# Patient Record
Sex: Female | Born: 1980 | Race: White | Hispanic: No | Marital: Married | Smoking: Former smoker
Health system: Southern US, Community
[De-identification: ages and names within clinical notes are randomized; demographics above are authoritative.]

## PROBLEM LIST (undated history)

## (undated) DIAGNOSIS — F10239 Alcohol dependence with withdrawal, unspecified: Secondary | ICD-10-CM

## (undated) DIAGNOSIS — E079 Disorder of thyroid, unspecified: Secondary | ICD-10-CM

## (undated) DIAGNOSIS — K859 Acute pancreatitis without necrosis or infection, unspecified: Secondary | ICD-10-CM

## (undated) DIAGNOSIS — F1010 Alcohol abuse, uncomplicated: Secondary | ICD-10-CM

## (undated) DIAGNOSIS — E876 Hypokalemia: Secondary | ICD-10-CM

## (undated) DIAGNOSIS — F10939 Alcohol use, unspecified with withdrawal, unspecified: Secondary | ICD-10-CM

## (undated) DIAGNOSIS — R569 Unspecified convulsions: Secondary | ICD-10-CM

## (undated) DIAGNOSIS — F1020 Alcohol dependence, uncomplicated: Secondary | ICD-10-CM

## (undated) HISTORY — PX: FINGER SURGERY: SHX640

---

## 2016-09-10 ENCOUNTER — Encounter (HOSPITAL_BASED_OUTPATIENT_CLINIC_OR_DEPARTMENT_OTHER): Payer: Self-pay

## 2016-09-10 ENCOUNTER — Emergency Department (HOSPITAL_BASED_OUTPATIENT_CLINIC_OR_DEPARTMENT_OTHER)
Admission: EM | Admit: 2016-09-10 | Discharge: 2016-09-10 | Disposition: A | Discharge status: Home / Self Care | Payer: Self-pay | Attending: Emergency Medicine | Admitting: Emergency Medicine

## 2016-09-10 DIAGNOSIS — Z0001 Encounter for general adult medical examination with abnormal findings: Secondary | ICD-10-CM | POA: Insufficient documentation

## 2016-09-10 DIAGNOSIS — R251 Tremor, unspecified: Secondary | ICD-10-CM | POA: Insufficient documentation

## 2016-09-10 DIAGNOSIS — Z139 Encounter for screening, unspecified: Secondary | ICD-10-CM

## 2016-09-10 DIAGNOSIS — R42 Dizziness and giddiness: Secondary | ICD-10-CM | POA: Insufficient documentation

## 2016-09-10 DIAGNOSIS — Z79899 Other long term (current) drug therapy: Secondary | ICD-10-CM | POA: Insufficient documentation

## 2016-09-10 HISTORY — DX: Disorder of thyroid, unspecified: E07.9

## 2016-09-10 HISTORY — DX: Alcohol abuse, uncomplicated: F10.10

## 2016-09-10 HISTORY — DX: Hypokalemia: E87.6

## 2016-09-10 LAB — BASIC METABOLIC PANEL
Anion gap: 6 (ref 5–15)
BUN: 6 mg/dL (ref 6–20)
CO2: 32 mmol/L (ref 22–32)
Calcium: 9.2 mg/dL (ref 8.9–10.3)
Chloride: 100 mmol/L — ABNORMAL LOW (ref 101–111)
Creatinine, Ser: 0.78 mg/dL (ref 0.44–1.00)
Glucose, Bld: 112 mg/dL — ABNORMAL HIGH (ref 65–99)
POTASSIUM: 3.6 mmol/L (ref 3.5–5.1)
SODIUM: 138 mmol/L (ref 135–145)

## 2016-09-10 LAB — CBC
HEMATOCRIT: 36.7 % (ref 36.0–46.0)
Hemoglobin: 12.3 g/dL (ref 12.0–15.0)
MCH: 33.9 pg (ref 26.0–34.0)
MCHC: 33.5 g/dL (ref 30.0–36.0)
MCV: 101.1 fL — ABNORMAL HIGH (ref 78.0–100.0)
PLATELETS: 203 10*3/uL (ref 150–400)
RBC: 3.63 MIL/uL — AB (ref 3.87–5.11)
RDW: 13.5 % (ref 11.5–15.5)
WBC: 3 10*3/uL — AB (ref 4.0–10.5)

## 2016-09-10 MED ORDER — POTASSIUM CHLORIDE ER 20 MEQ PO TBCR: 20.0000 meq | EXTENDED_RELEASE_TABLET | Freq: Every day | ORAL | 0 refills | Status: DC

## 2016-09-10 NOTE — ED Notes (Signed)
ED Provider at bedside. 

## 2016-09-10 NOTE — ED Provider Notes (Signed)
[facility] Provider Note   CSN: [id number] Arrival date & time: 09/10/16  1630  By signing my name below, I, Sheila Graves, attest that this documentation has been prepared under the direction and in the presence of Audry Piliyler Deatrice Spanbauer, PA-C.  Electronically Signed: Rosario AdieWilliam Andrew Graves, ED Scribe. 09/10/16. 5:02 PM.  History   Chief Complaint Chief Complaint  Patient presents with  . Labs Only   The history is provided by the patient. No language interpreter was used.    HPI Comments: Sheila Graves is a 36 y.o. female with a h/o hypokalemia, hypothyroidism (on Le, and alcohol abuse, who [facility] requesting her potassium levels to be checked and for a refill of her potassium chloride medication. Pt reports that she has been on potassium chloride for the past few years d/t hypokalemia, however, she recently [facility] care with [facility] for alcohol [facility] and has been unable to refill her medications. She notes that since being off of her medication that she has been experiencing intermittent paraesthesias distally and also has had hand contractures bilaterally. Pt also reports that she has been experiencing mild hand tremors over the past several weeks; however, she attributes this to her detoxing form alcohol. Pt also notes over the past several days she has experienced room-spinning dizziness and lightheadedness as well. She has been previously hospitalized for her hypokalemia. Pt denies numbness, fever, or any other associated symptoms.   Past Medical History:  Diagnosis Date  . ETOH abuse   . Hypokalemia   . Thyroid disease    There are no active problems to display for this patient.  Past Surgical History:  Procedure Laterality Date  . FINGER SURGERY     OB History    No data available     Home Medications    Prior to Admission medications   Not on File   Family History No family history on file.  Social  History Social History  Substance Use Topics  . Smoking status: Never Smoker  . Smokeless tobacco: Never Used  . Alcohol use Not on file     Comment: in rehab   Allergies   Patient has no known allergies.  Review of Systems Review of Systems  Constitutional: Negative for fever.  Neurological: Positive for dizziness, tremors and light-headedness. Negative for numbness.   Physical Exam Updated Vital Signs BP 115/78 (BP Location: Right Arm)   Pulse (!) 59   Temp 98.1 F (36.7 C) (Oral)   Resp (!) 22   Ht 5\' 10"  (1.778 m)   Wt 135 lb (61.2 kg)   LMP 08/27/2016   SpO2 100%   BMI 19.37 kg/m   Physical Exam  Constitutional: She is oriented to person, place, and time. Vital signs are normal. She appears well-developed and well-nourished. No distress.  HENT:  Head: Normocephalic and atraumatic.  Right Ear: Hearing normal.  Left Ear: Hearing normal.  Eyes: Conjunctivae and EOM are normal. Pupils are equal, round, and reactive to light.  Neck: Normal range of motion.  Cardiovascular: Normal rate, regular rhythm and normal heart sounds.   No murmur heard. Pulmonary/Chest: Effort normal and breath sounds normal. No respiratory distress. She has no wheezes. She has no rales.  Abdominal: She exhibits no distension.  Musculoskeletal: Normal range of motion.  Neurological: She is alert and oriented to person, place, and time.  Skin: Skin is warm and dry. Capillary refill takes less than 2 seconds. No pallor.  Psychiatric: She  has a normal mood and affect. Her speech is normal and behavior is normal. Thought content normal.  Nursing note and vitals reviewed.  ED Treatments / Results  DIAGNOSTIC STUDIES: Oxygen Saturation is 100% on RA, normal by my interpretation.   COORDINATION OF CARE: 4:55 PM-Discussed next steps with pt. Pt verbalized understanding and is agreeable with the plan.   Labs (all labs ordered are listed, but only abnormal results are displayed) Labs Reviewed    BASIC METABOLIC PANEL   EKG  EKG Interpretation None      Radiology No results found.  Procedures Procedures  Medications Ordered in ED Medications - No data to display  Initial Impression / Assessment and Plan / ED Course  I have reviewed the triage vital signs and the nursing notes.  Pertinent labs & imaging results that were available during my care of the patient were reviewed by me and considered in my medical decision making (see chart for details).  Final Clinical Impressions(s) / ED Diagnoses  {I have reviewed and evaluated the relevant laboratory values.   {I have reviewed the relevant previous healthcare records.  {I obtained HPI from historian.   ED Course:  Assessment: Pt is a 36 y.o. female who presents for medical clearance from [facility]. Notes unable to see PCP for potassium check. Hx hypokalemia. Out of Kdur x 1 week. . On exam, pt in NAD. Nontoxic/nonseptic appearing. VSS. Afebrile. Lungs CTA. Heart RRR. Abdomen nontender soft. Labs reassuring. Potassium 3.6. Given Rx Kdur. Plan is to DC home back to [facility]. No signs of withdrawal in ED. Medically cleared. At time of discharge, Patient is in no acute distress. Vital Signs are stable. Patient is able to ambulate. Patient able to tolerate PO.   Disposition/Plan:  DC Home Additional Verbal discharge instructions given and discussed with patient.  Pt Instructed to f/u with PCP in the next week for evaluation and treatment of symptoms. Return precautions given Pt acknowledges and agrees with plan  Supervising Physician Laurence Spates, MD  Final diagnoses:  Encounter for medical screening examination   New Prescriptions New Prescriptions   No medications on file    I personally performed the services described in this documentation, which was scribed in my presence. The recorded information has been reviewed and is accurate.    Audry Pili, PA-C 09/10/16 1740    Laurence Spates, MD 09/11/16  [phone]

## 2016-09-10 NOTE — ED Triage Notes (Signed)
[facility] to have K+ level checked-pt states she has been there since March 9 and has not been on K+ po-pt NAD-steady gait

## 2016-09-10 NOTE — Discharge Instructions (Signed)
Please read and follow all provided instructions.  Your diagnoses today include:  1. Encounter for medical screening examination    Tests performed today include: Vital signs. See below for your results today.   Medications prescribed:  Take as prescribed   Home care instructions:  Follow any educational materials contained in this packet.  Follow-up instructions: Please follow-up with your primary care provider for further evaluation of symptoms and treatment   Return instructions:  Please return to the Emergency Department if you do not get better, if you get worse, or new symptoms OR  - Fever (temperature greater than 101.64F)  - Bleeding that does not stop with holding pressure to the area    -Severe pain (please note that you may be more sore the day after your accident)  - Chest Pain  - Difficulty breathing  - Severe nausea or vomiting  - Inability to tolerate food and liquids  - Passing out  - Skin becoming red around your wounds  - Change in mental status (confusion or lethargy)  - New numbness or weakness    Please return if you have any other emergent concerns.  Additional Information:  Your vital signs today were: BP 115/78 (BP Location: Right Arm)    Pulse (!) 59    Temp 98.1 F (36.7 C) (Oral)    Resp (!) 22    Ht 5\' 10"  (1.778 m)    Wt 61.2 kg    LMP 08/27/2016    SpO2 100%    BMI 19.37 kg/m  If your blood pressure (BP) was elevated above 135/85 this visit, please have this repeated by your doctor within one month. ---------------

## 2016-09-21 ENCOUNTER — Encounter (HOSPITAL_BASED_OUTPATIENT_CLINIC_OR_DEPARTMENT_OTHER): Payer: Self-pay | Admitting: Emergency Medicine

## 2016-09-21 ENCOUNTER — Emergency Department (HOSPITAL_BASED_OUTPATIENT_CLINIC_OR_DEPARTMENT_OTHER): Payer: Self-pay

## 2016-09-21 ENCOUNTER — Emergency Department (HOSPITAL_BASED_OUTPATIENT_CLINIC_OR_DEPARTMENT_OTHER)
Admission: EM | Admit: 2016-09-21 | Discharge: 2016-09-21 | Disposition: A | Discharge status: Home / Self Care | Payer: Self-pay | Attending: Emergency Medicine | Admitting: Emergency Medicine

## 2016-09-21 DIAGNOSIS — Z79899 Other long term (current) drug therapy: Secondary | ICD-10-CM | POA: Insufficient documentation

## 2016-09-21 DIAGNOSIS — R0789 Other chest pain: Secondary | ICD-10-CM | POA: Insufficient documentation

## 2016-09-21 DIAGNOSIS — R51 Headache: Secondary | ICD-10-CM | POA: Insufficient documentation

## 2016-09-21 DIAGNOSIS — R079 Chest pain, unspecified: Secondary | ICD-10-CM

## 2016-09-21 DIAGNOSIS — R42 Dizziness and giddiness: Secondary | ICD-10-CM | POA: Insufficient documentation

## 2016-09-21 HISTORY — DX: Alcohol use, unspecified with withdrawal, unspecified: F10.939

## 2016-09-21 HISTORY — DX: Acute pancreatitis without necrosis or infection, unspecified: K85.90

## 2016-09-21 HISTORY — DX: Alcohol dependence with withdrawal, unspecified: F10.239

## 2016-09-21 HISTORY — DX: Unspecified convulsions: R56.9

## 2016-09-21 LAB — CBC
HCT: 42.7 % (ref 36.0–46.0)
Hemoglobin: 14.3 g/dL (ref 12.0–15.0)
MCH: 33.2 pg (ref 26.0–34.0)
MCHC: 33.5 g/dL (ref 30.0–36.0)
MCV: 99.1 fL (ref 78.0–100.0)
Platelets: 216 10*3/uL (ref 150–400)
RBC: 4.31 MIL/uL (ref 3.87–5.11)
RDW: 13.1 % (ref 11.5–15.5)
WBC: 4.3 10*3/uL (ref 4.0–10.5)

## 2016-09-21 LAB — BASIC METABOLIC PANEL
Anion gap: 9 (ref 5–15)
BUN: 9 mg/dL (ref 6–20)
CALCIUM: 9.5 mg/dL (ref 8.9–10.3)
CO2: 26 mmol/L (ref 22–32)
Chloride: 102 mmol/L (ref 101–111)
Creatinine, Ser: 0.85 mg/dL (ref 0.44–1.00)
GFR calc Af Amer: >60 mL/min (ref >60)
GLUCOSE: 93 mg/dL (ref 65–99)
Potassium: 3.8 mmol/L (ref 3.5–5.1)
Sodium: 137 mmol/L (ref 135–145)

## 2016-09-21 LAB — TROPONIN I: Troponin I: <0.03 ng/mL (ref <0.03)

## 2016-09-21 NOTE — ED Provider Notes (Signed)
[facility]Y [facility]PT [facility] Provider Note   CSN: [id number] Arrival date & time: 09/21/16  1938  By signing my name below, I, Teofilo Pod, attest that this documentation has been prepared under the direction and in the presence of Benjiman Core, MD . Electronically Signed: Teofilo Pod, ED Scribe. 09/21/2016. 7:57 PM.    History   Chief Complaint Chief Complaint  Patient presents with  . Chest Pain     The history is provided by the patient. No language interpreter was used.   HPI Comments:  Sheila Graves is a 36 y.o. female who presents to the Emergency Department complaining of sudden onset lightheadedness that began 1 hour ago. Pt complains of associated headache, diaphoresis, and chest pain. Pt is at [facility] for EtOH abuse, and has been there for 11 days. She states that she used to drink 5-6 drinks a day. Pt has been taking thyroid medications, but reports that her drinking caused her to take it irregularly. Pt is not a smoker, and is not on birth control. No alleviating factors noted. Pt denies other associated symptoms.    Past Medical History:  Diagnosis Date  . Alcohol withdrawal seizure (HCC)   . ETOH abuse   . Hypokalemia   . Pancreatitis   . Thyroid disease     There are no active problems to display for this patient.   Past Surgical History:  Procedure Laterality Date  . FINGER SURGERY      OB History    No data available       Home Medications    Prior to Admission medications   Medication Sig Start Date End Date Taking? Authorizing Provider  Cholecalciferol (VITAMIN D3) 2000 units TABS Take by mouth.   Yes Historical Provider, MD  Levothyroxine Sodium 75 MCG CAPS Take by mouth daily before breakfast.   Yes Historical Provider, MD  Multiple Vitamins-Minerals (CENTRUM ADULTS PO) Take by mouth.   Yes Historical Provider, MD  potassium chloride 20 MEQ TBCR Take 20 mEq by mouth daily. 09/10/16  Yes Audry Pili, PA-C  traZODone (DESYREL) 100 MG  tablet Take 100 mg by mouth at bedtime.   Yes Historical Provider, MD    Family History No family history on file.  Social History Social History  Substance Use Topics  . Smoking status: Never Smoker  . Smokeless tobacco: Never Used  . Alcohol use Not on file     Comment: in rehab     Allergies   Patient has no known allergies.   Review of Systems Review of Systems  Constitutional: Positive for diaphoresis.  Cardiovascular: Positive for chest pain.  Neurological: Positive for light-headedness and headaches.  All other systems reviewed and are negative.    Physical Exam Updated Vital Signs BP 118/81 (BP Location: Right Arm)   Pulse (!) 56   Temp 97.7 F (36.5 C) (Oral)   Resp 18   Ht 5\' 10"  (1.778 m)   Wt 135 lb (61.2 kg)   LMP 08/27/2016   SpO2 100%   BMI 19.37 kg/m   Physical Exam  Constitutional: She appears well-developed and well-nourished. No distress.  HENT:  Head: Normocephalic and atraumatic.  Eyes: Conjunctivae are normal.  Cardiovascular: Normal rate.   Pulmonary/Chest: Effort normal.  Tenderness to anterior chest.  Abdominal: She exhibits no distension. There is no tenderness.  Neurological: She is alert.  Skin: Skin is warm and dry.  Psychiatric: She has a normal mood and affect.  Nursing note and vitals reviewed.  ED Treatments / Results  Labs (all labs ordered are listed, but only abnormal results are displayed) Labs Reviewed  BASIC METABOLIC PANEL  CBC  TROPONIN I    EKG  EKG Interpretation  Date/Time:  Monday September 21 2016 19:44:42 EDT Ventricular Rate:  50 PR Interval:    QRS Duration: 93 QT Interval:  458 QTC Calculation: 418 R Axis:   78 Text Interpretation:  Sinus rhythm Baseline wander in lead(s) III Confirmed by Rubin PayorPICKERING  MD, [place] Maulden [phone]([id number]) on 09/21/2016 7:50:20 PM       Radiology Dg Chest 2 View  Result Date: 09/21/2016 CLINICAL DATA:  Left-sided chest pressure with dyspnea and dizziness EXAM: CHEST  2 VIEW  COMPARISON:  None. FINDINGS: The heart size and mediastinal contours are within normal limits. Both lungs are clear. The visualized skeletal structures are unremarkable. IMPRESSION: No active cardiopulmonary disease. Electronically Signed   By: Tollie Ethavid  Kwon M.D.   On: 09/21/2016 20:08    Procedures Procedures (including critical care time)  Medications Ordered in ED Medications - No data to display   Initial Impression / Assessment and Plan / ED Course  I have reviewed the triage vital signs and the nursing notes.  Pertinent labs & imaging results that were available during my care of the patient were reviewed by me and considered in my medical decision making (see chart for details).     Tenderness to anterior chest wall. EKG lab work and x-ray reassuring. Doubt pulmonary embolism. Doubt cardiac cause. Will discharge home. Abdomen nontender.  Final Clinical Impressions(s) / ED Diagnoses   Final diagnoses:  Nonspecific chest pain    New Prescriptions New Prescriptions   No medications on file  I personally performed the services described in this documentation, which was scribed in my presence. The recorded information has been reviewed and is accurate.        Benjiman CoreNathan Lillionna Nabi, MD 09/21/16 2120

## 2016-09-21 NOTE — ED Triage Notes (Signed)
Pt states that an hour ago she started having left chest pressure with SOB and dizziness, denies nausea, denies referred pain to back, pt is from [facility]

## 2018-01-29 ENCOUNTER — Emergency Department (HOSPITAL_COMMUNITY)
Admission: EM | Admit: 2018-01-29 | Discharge: 2018-01-31 | Disposition: A | Discharge status: Home / Self Care | Payer: Self-pay | Attending: Emergency Medicine | Admitting: Emergency Medicine

## 2018-01-29 ENCOUNTER — Other Ambulatory Visit: Payer: Self-pay

## 2018-01-29 ENCOUNTER — Encounter (HOSPITAL_COMMUNITY): Payer: Self-pay | Admitting: Emergency Medicine

## 2018-01-29 DIAGNOSIS — F1014 Alcohol abuse with alcohol-induced mood disorder: Secondary | ICD-10-CM | POA: Diagnosis present

## 2018-01-29 DIAGNOSIS — Z046 Encounter for general psychiatric examination, requested by authority: Secondary | ICD-10-CM | POA: Insufficient documentation

## 2018-01-29 DIAGNOSIS — F332 Major depressive disorder, recurrent severe without psychotic features: Secondary | ICD-10-CM | POA: Diagnosis present

## 2018-01-29 DIAGNOSIS — F1020 Alcohol dependence, uncomplicated: Secondary | ICD-10-CM | POA: Insufficient documentation

## 2018-01-29 DIAGNOSIS — Z79899 Other long term (current) drug therapy: Secondary | ICD-10-CM | POA: Insufficient documentation

## 2018-01-29 DIAGNOSIS — F10239 Alcohol dependence with withdrawal, unspecified: Secondary | ICD-10-CM

## 2018-01-29 DIAGNOSIS — Y908 Blood alcohol level of 240 mg/100 ml or more: Secondary | ICD-10-CM | POA: Insufficient documentation

## 2018-01-29 DIAGNOSIS — T503X2A Poisoning by electrolytic, caloric and water-balance agents, intentional self-harm, initial encounter: Secondary | ICD-10-CM | POA: Insufficient documentation

## 2018-01-29 DIAGNOSIS — T1491XA Suicide attempt, initial encounter: Secondary | ICD-10-CM

## 2018-01-29 DIAGNOSIS — R4182 Altered mental status, unspecified: Secondary | ICD-10-CM | POA: Insufficient documentation

## 2018-01-29 LAB — COMPREHENSIVE METABOLIC PANEL
ALBUMIN: 4.2 g/dL (ref 3.5–5.0)
ALK PHOS: 67 U/L (ref 38–126)
ALT: 29 U/L (ref 0–44)
ANION GAP: 14 (ref 5–15)
AST: 43 U/L — ABNORMAL HIGH (ref 15–41)
BILIRUBIN TOTAL: 0.9 mg/dL (ref 0.3–1.2)
BUN: 13 mg/dL (ref 6–20)
CALCIUM: 8.4 mg/dL — AB (ref 8.9–10.3)
CO2: 24 mmol/L (ref 22–32)
CREATININE: 0.83 mg/dL (ref 0.44–1.00)
Chloride: 108 mmol/L (ref 98–111)
GFR calc Af Amer: >60 mL/min (ref >60)
GFR calc non Af Amer: >60 mL/min (ref >60)
GLUCOSE: 94 mg/dL (ref 70–99)
Potassium: 4.8 mmol/L (ref 3.5–5.1)
Sodium: 146 mmol/L — ABNORMAL HIGH (ref 135–145)
TOTAL PROTEIN: 6.9 g/dL (ref 6.5–8.1)

## 2018-01-29 LAB — CBC WITH DIFFERENTIAL/PLATELET
BASOS ABS: 0 10*3/uL (ref 0.0–0.1)
Basophils Relative: 1 %
Eosinophils Absolute: 0 10*3/uL (ref 0.0–0.7)
Eosinophils Relative: 0 %
HEMATOCRIT: 45.3 % (ref 36.0–46.0)
HEMOGLOBIN: 15.5 g/dL — AB (ref 12.0–15.0)
LYMPHS PCT: 53 %
Lymphs Abs: 2.1 10*3/uL (ref 0.7–4.0)
MCH: 34.4 pg — ABNORMAL HIGH (ref 26.0–34.0)
MCHC: 34.2 g/dL (ref 30.0–36.0)
MCV: 100.7 fL — ABNORMAL HIGH (ref 78.0–100.0)
MONO ABS: 0.2 10*3/uL (ref 0.1–1.0)
Monocytes Relative: 4 %
NEUTROS ABS: 1.7 10*3/uL (ref 1.7–7.7)
NEUTROS PCT: 42 %
Platelets: 208 10*3/uL (ref 150–400)
RBC: 4.5 MIL/uL (ref 3.87–5.11)
RDW: 13.7 % (ref 11.5–15.5)
WBC: 4 10*3/uL (ref 4.0–10.5)

## 2018-01-29 LAB — ETHANOL: Alcohol, Ethyl (B): 526 mg/dL (ref <10)

## 2018-01-29 LAB — RAPID URINE DRUG SCREEN, HOSP PERFORMED
Amphetamines: NOT DETECTED
BARBITURATES: NOT DETECTED
Benzodiazepines: NOT DETECTED
COCAINE: NOT DETECTED
TETRAHYDROCANNABINOL: NOT DETECTED

## 2018-01-29 LAB — SALICYLATE LEVEL: Salicylate Lvl: <7 mg/dL (ref 2.8–30.0)

## 2018-01-29 LAB — ACETAMINOPHEN LEVEL: Acetaminophen (Tylenol), Serum: <10 ug/mL — ABNORMAL LOW (ref 10–30)

## 2018-01-29 MED ORDER — HALOPERIDOL LACTATE 5 MG/ML IJ SOLN
5.0000 mg | Freq: Once | INTRAMUSCULAR | Status: AC
  Administered 2018-01-29: 5 mg via INTRAVENOUS
  Filled 2018-01-29: qty 1

## 2018-01-29 MED ORDER — ACETAMINOPHEN 325 MG PO TABS: 650.0000 mg | ORAL_TABLET | ORAL | Status: DC | PRN

## 2018-01-29 MED ORDER — LORAZEPAM 2 MG/ML IJ SOLN: 0.0000 mg | Freq: Four times a day (QID) | INTRAMUSCULAR | Status: DC

## 2018-01-29 MED ORDER — LORAZEPAM 2 MG/ML IJ SOLN: 0.0000 mg | Freq: Two times a day (BID) | INTRAMUSCULAR | Status: DC

## 2018-01-29 MED ORDER — LORAZEPAM 1 MG PO TABS: 0.0000 mg | ORAL_TABLET | Freq: Two times a day (BID) | ORAL | Status: DC

## 2018-01-29 MED ORDER — THIAMINE HCL 100 MG/ML IJ SOLN: 100.0000 mg | Freq: Every day | INTRAMUSCULAR | Status: DC

## 2018-01-29 MED ORDER — SODIUM CHLORIDE 0.9 % IV BOLUS
1000.0000 mL | Freq: Once | INTRAVENOUS | Status: AC
  Administered 2018-01-29: 1000 mL via INTRAVENOUS

## 2018-01-29 MED ORDER — VITAMIN B-1 100 MG PO TABS
100.0000 mg | ORAL_TABLET | Freq: Every day | ORAL | Status: DC
  Administered 2018-01-30 – 2018-01-31 (×2): 100 mg via ORAL
  Filled 2018-01-29 (×2): qty 1

## 2018-01-29 MED ORDER — ALUM & MAG HYDROXIDE-SIMETH 200-200-20 MG/5ML PO SUSP: 30.0000 mL | Freq: Four times a day (QID) | ORAL | Status: DC | PRN

## 2018-01-29 MED ORDER — ONDANSETRON HCL 4 MG PO TABS: 4.0000 mg | ORAL_TABLET | Freq: Three times a day (TID) | ORAL | Status: DC | PRN

## 2018-01-29 MED ORDER — LORAZEPAM 1 MG PO TABS: 0.0000 mg | ORAL_TABLET | Freq: Four times a day (QID) | ORAL | Status: DC

## 2018-01-29 MED ORDER — LEVOTHYROXINE SODIUM 75 MCG PO TABS
75.0000 ug | ORAL_TABLET | Freq: Every day | ORAL | Status: DC
  Administered 2018-01-30 – 2018-01-31 (×2): 75 ug via ORAL
  Filled 2018-01-29 (×2): qty 1

## 2018-01-29 NOTE — ED Notes (Signed)
Pt. Got agitated when mother came into the room at 2100. She began yelling offensive words and continued even after her mother left the room. Pt. At the moment, has calmed down, but will continue to say offensive words if spoken to.

## 2018-01-29 NOTE — ED Notes (Signed)
Bed: RESA Expected date:  Expected time:  Means of arrival:  Comments: 

## 2018-01-29 NOTE — Progress Notes (Signed)
Consult ordered at 23:16. Pt's BAL is 526. Pt was given Ativan due to agitation at 23:15. Pt is sleeping at this time and unable to be aroused. Pt cannot be assessed at this time. TTS will assess once pt is alert.   Princess BruinsAquicha Nattie Lazenby, MSW, LCSW Therapeutic Triage Specialist  [phone]

## 2018-01-29 NOTE — ED Notes (Addendum)
[facility], Obtain regular labs, and EKG, Observe for CNS and GI upset. Observe for 6 hours if asymptomatic and VSS she can be cleared.

## 2018-01-29 NOTE — ED Provider Notes (Signed)
[facility]-EMERGENCY DEPT Provider Note   CSN: [id number] Arrival date & time: 01/29/18  1705     History   Chief Complaint Chief Complaint  Patient presents with  . Drug Overdose    HPI Sheila Graves is a 37 y.o. female.  37 yo F with a chief complaint of intentional drug overdose.  The patient told someone that she took potassium pills in an attempt to end her life.  She also drinking about a bottle of vodka.  She is not willing to discuss any of this with me.  Level 5 caveat.  Per EMS the patient took some pills in an unknown time.  She was initially hypotensive, improved with some IV fluids.  The history is provided by the patient.  Drug Overdose  This is a new problem. The current episode started yesterday. The problem occurs constantly. The problem has not changed since onset.Pertinent negatives include no chest pain, no headaches and no shortness of breath. Nothing aggravates the symptoms. Nothing relieves the symptoms. She has tried nothing for the symptoms. The treatment provided no relief.    Past Medical History:  Diagnosis Date  . Alcohol withdrawal seizure (HCC)   . ETOH abuse   . Hypokalemia   . Pancreatitis   . Thyroid disease     There are no active problems to display for this patient.   Past Surgical History:  Procedure Laterality Date  . FINGER SURGERY       OB History   None      Home Medications    Prior to Admission medications   Medication Sig Start Date End Date Taking? Authorizing Provider  Cholecalciferol (VITAMIN D3) 2000 units TABS Take by mouth.    [provider]  Levothyroxine Sodium 75 MCG CAPS Take by mouth daily before breakfast.    [provider]  Multiple Vitamins-Minerals (CENTRUM ADULTS PO) Take by mouth.    [provider]  potassium chloride 20 MEQ TBCR Take 20 mEq by mouth daily. 09/10/16   Audry Pili, PA-C  traZODone (DESYREL) 100 MG tablet Take 100 mg by mouth at  bedtime.    [provider]    Family History No family history on file.  Social History Social History   Tobacco Use  . Smoking status: Never Smoker  . Smokeless tobacco: Never Used  Substance Use Topics  . Alcohol use: Not on file    Comment: in rehab  . Drug use: No     Allergies   Patient has no known allergies.   Review of Systems Review of Systems  Unable to perform ROS: Mental status change  Constitutional: Negative for chills and fever.  HENT: Negative for congestion and rhinorrhea.   Eyes: Negative for redness and visual disturbance.  Respiratory: Negative for shortness of breath and wheezing.   Cardiovascular: Negative for chest pain and palpitations.  Gastrointestinal: Negative for nausea and vomiting.  Genitourinary: Negative for dysuria and urgency.  Musculoskeletal: Negative for arthralgias and myalgias.  Skin: Negative for pallor and wound.  Neurological: Negative for dizziness and headaches.     Physical Exam Updated Vital Signs BP 90/62   Pulse 91   Temp 97.6 F (36.4 C) (Oral)   Resp 18   Ht 5\' 10"  (1.778 m)   Wt 61.2 kg (135 lb)   LMP  (LMP Unknown)   SpO2 98%   BMI 19.37 kg/m   Physical Exam  Constitutional: She appears well-developed and well-nourished. No distress.  HENT:  Head: Normocephalic and atraumatic.  Eyes: Pupils are equal, round, and reactive to light. EOM are normal.  Neck: Normal range of motion. Neck supple.  Cardiovascular: Normal rate and regular rhythm. Exam reveals no gallop and no friction rub.  No murmur heard. Pulmonary/Chest: Effort normal. She has no wheezes. She has no rales.  Abdominal: Soft. She exhibits no distension and no mass. There is no tenderness. There is no guarding.  Musculoskeletal: She exhibits no edema or tenderness.  Neurological: She is alert.  Skin: Skin is warm and dry. She is not diaphoretic.  Psychiatric: She exhibits a depressed mood. She expresses suicidal ideation.  Nursing  note and vitals reviewed.    ED Treatments / Results  Labs (all labs ordered are listed, but only abnormal results are displayed) Labs Reviewed  COMPREHENSIVE METABOLIC PANEL - Abnormal; Notable for the following components:      Result Value   Sodium 146 (*)    Calcium 8.4 (*)    AST 43 (*)    All other components within normal limits  ETHANOL - Abnormal; Notable for the following components:   Alcohol, Ethyl (B) 526 (*)    All other components within normal limits  RAPID URINE DRUG SCREEN, HOSP PERFORMED - Abnormal; Notable for the following components:   Opiates   (*)    Value: Result not available. Reagent lot number recalled by manufacturer.   All other components within normal limits  CBC WITH DIFFERENTIAL/PLATELET - Abnormal; Notable for the following components:   Hemoglobin 15.5 (*)    MCV 100.7 (*)    MCH 34.4 (*)    All other components within normal limits  ACETAMINOPHEN LEVEL - Abnormal; Notable for the following components:   Acetaminophen (Tylenol), Serum <10 (*)    All other components within normal limits  SALICYLATE LEVEL  I-STAT BETA HCG BLOOD, ED ([facility], [facility], [facility] ONLY)  CBG MONITORING, ED    EKG None  Radiology No results found.  Procedures Procedures (including critical care time)  Medications Ordered in ED Medications  LORazepam (ATIVAN) injection 0-4 mg (has no administration in time range)    Or  LORazepam (ATIVAN) tablet 0-4 mg (has no administration in time range)  LORazepam (ATIVAN) injection 0-4 mg (has no administration in time range)    Or  LORazepam (ATIVAN) tablet 0-4 mg (has no administration in time range)  thiamine (VITAMIN B-1) tablet 100 mg (has no administration in time range)    Or  thiamine (B-1) injection 100 mg (has no administration in time range)  ondansetron (ZOFRAN) tablet 4 mg (has no administration in time range)  alum & mag hydroxide-simeth (MAALOX/MYLANTA) 200-200-20 MG/5ML suspension 30 mL (has no administration in  time range)  acetaminophen (TYLENOL) tablet 650 mg (has no administration in time range)  sodium chloride 0.9 % bolus 1,000 mL (0 mLs Intravenous Stopped 01/29/18 2011)  haloperidol lactate (HALDOL) injection 5 mg (5 mg Intravenous Given 01/29/18 2144)     Initial Impression / Assessment and Plan / ED Course  I have reviewed the triage vital signs and the nursing notes.  Pertinent labs & imaging results that were available during my care of the patient were reviewed by me and considered in my medical decision making (see chart for details).  Clinical Course as of Jan 29 2321  Sat Jan 29, 2018  [id number] Patient's alcohol level 526.  Hemoglobin stable, sodium mildly elevated.  Tylenol and salicylate levels are negative.  Patient is threatening to leave, IVC paperwork filled  out.   [DF]    Clinical Course User Index [DF] Melene Plan, DO    37 yo F with a chief complaint of intentional drug overdose. Not willing to discuss anything further.  Will discuss with poison control. Give fluids, labs.  ECG without concerning finding.    6 hours elapsed.  Had to give haldol for agitation.  Now medically clear. Able to place in psych hold.  TTS eval when sober.    The patients results and plan were reviewed and discussed.   Any x-rays performed were independently reviewed by myself.   Differential diagnosis were considered with the presenting HPI.  Medications  LORazepam (ATIVAN) injection 0-4 mg (has no administration in time range)    Or  LORazepam (ATIVAN) tablet 0-4 mg (has no administration in time range)  LORazepam (ATIVAN) injection 0-4 mg (has no administration in time range)    Or  LORazepam (ATIVAN) tablet 0-4 mg (has no administration in time range)  thiamine (VITAMIN B-1) tablet 100 mg (has no administration in time range)    Or  thiamine (B-1) injection 100 mg (has no administration in time range)  ondansetron (ZOFRAN) tablet 4 mg (has no administration in time range)  alum & mag  hydroxide-simeth (MAALOX/MYLANTA) 200-200-20 MG/5ML suspension 30 mL (has no administration in time range)  acetaminophen (TYLENOL) tablet 650 mg (has no administration in time range)  sodium chloride 0.9 % bolus 1,000 mL (0 mLs Intravenous Stopped 01/29/18 2011)  haloperidol lactate (HALDOL) injection 5 mg (5 mg Intravenous Given 01/29/18 2144)    Vitals:   01/29/18 2100 01/29/18 2115 01/29/18 2130 01/29/18 2200  BP: 93/62 97/74 91/68  90/62  Pulse: 92 94 78 91  Resp: 19 15 18 18   Temp:      TempSrc:      SpO2: 91% 100% 97% 98%  Weight:      Height:        Final diagnoses:  Suicide attempt [facility])    Admission/ observation were discussed with the admitting physician, patient and/or family and they are comfortable with the plan.    Final Clinical Impressions(s) / ED Diagnoses   Final diagnoses:  Suicide attempt [facility])    ED Discharge Orders    None       Melene Plan, DO 01/29/18 2322

## 2018-01-29 NOTE — ED Triage Notes (Signed)
Pt from home via EMS. Pt friends on scene report the pt had been drinking vodka and took pills. Friends suspect that pt took K+ that is prescribed to her, but there are many in the bottle that EMS brought. Pt drank approx 1/2 of a fifth of vodka today. Pt is alert to painful stimuli. Pt in NAD. Dr Adela LankFloyd at bedside. Pt VS per EMS 104/74, HR 77, RR 13, O2 100%RA. IV 18G R forearm

## 2018-01-30 ENCOUNTER — Encounter (HOSPITAL_COMMUNITY): Payer: Self-pay | Admitting: Behavioral Health

## 2018-01-30 DIAGNOSIS — F1014 Alcohol abuse with alcohol-induced mood disorder: Secondary | ICD-10-CM | POA: Diagnosis present

## 2018-01-30 LAB — URINALYSIS, ROUTINE W REFLEX MICROSCOPIC
Bilirubin Urine: NEGATIVE
Glucose, UA: NEGATIVE mg/dL
Hgb urine dipstick: NEGATIVE
KETONES UR: NEGATIVE mg/dL
LEUKOCYTES UA: NEGATIVE
Nitrite: NEGATIVE
PH: 7 (ref 5.0–8.0)
Protein, ur: NEGATIVE mg/dL
SPECIFIC GRAVITY, URINE: 1.014 (ref 1.005–1.030)

## 2018-01-30 LAB — PREGNANCY, URINE: Preg Test, Ur: NEGATIVE

## 2018-01-30 MED ORDER — TRAZODONE HCL 100 MG PO TABS: 100.0000 mg | ORAL_TABLET | Freq: Every evening | ORAL | Status: DC | PRN

## 2018-01-30 NOTE — BH Assessment (Addendum)
[facility]      Per Dr Jannifer FranklinAkintayo and Elta GuadeloupeLaurie Parks, NP, Patient states that she does not need a behavioral health intervention.  Patient will be monitored and observed overnight for safety and withdrawal complications and will be re-assessed in the morning.  Peer Support Consult ordered.

## 2018-01-30 NOTE — ED Notes (Signed)
[facility] Expected date:  Expected time:  Means of arrival:  Comments: [id number]

## 2018-01-30 NOTE — BH Assessment (Signed)
Assessment Note  Sheila Graves is an 37 y.o. female who presented in the [facility] after she ingested potassium in a possible suicide attempt while drinking alcohol.  She presented in the ED with a BAL of 526. Patient's friends states that patient indicated that she told them that she took an overdose in order to end her life.  Patient admits to drinking 2-3 mixed drinks 2-3 times weekly because she cannot afford to drink more that that because she is unemployed and cannot afford to.  However, she states that she drank a fifth of liquor yesterday.  Patient denies any current withdrawal symptoms. Patient denies and other drug use. Patient denied any prior history of treatment, but Care Everywhere shows multiple admissions to [facility] for detox and alcohol related aspiration pneumonia.  Patient denies that she is depressed and is very guarded in revealing her reason for drinking so heavily yesterday.  Patient states that she has never made any suicide attempts in the past and states that she has never had any mental health treatment nor has she been on medication for depression.  Patient denies HI/Psychosis.  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 Patient states that she is struggling fianancially.   Patient states that she has no history of abuse or self-mutilation.  Patient states that she has never been married and has no children. Patient states that she has not been sleeping and gets no more than three hours per night.  She states that her appetite is poor, but denies that she has lost any weight.   Patient presented as alert and oriented.  Her speech was clear, her eye contact was fair and her thoughts were organized.  Her memory appeared to be intact, but she was guarded when asked questions and was not forthcoming with information. She appeared to be depressed.  Her psychomotor activity was  normal.  She did not seem to be responding to internal stimuli.  Her judgment is impaired, her impulse control is poor and her insight is poor.    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 Alcohol withdrawal seizure (HCC)   . ETOH abuse   . Hypokalemia   . Pancreatitis   . Thyroid disease     Past Surgical History:  Procedure Laterality Date  . FINGER SURGERY      Family History: No family history on file.  Social History:  reports that she has never smoked. She has never used smokeless tobacco. She reports that she drinks alcohol. She reports that she does not use drugs.  Additional Social History:  Alcohol / Drug Use Pain Medications: denies Prescriptions: denies Over the Counter: denies History of alcohol / drug use?: Yes Longest period of sobriety (when/how long): none reported Negative Consequences of Use: Financial, Personal relationships Substance #1 Name of Substance 1: alcohol 1 - Age of First Use: 22 1 - Amount (size/oz): 2-3 drinks 1 - Frequency: 2-3 times weekly 1 - Duration: since onset 1 - Last Use / Amount: yesterday, one fifth  CIWA: CIWA-Ar BP: 101/68 Pulse Rate: 82 Nausea and Vomiting: no nausea and no vomiting Tactile Disturbances: none Tremor: no tremor Auditory Disturbances: not present Paroxysmal Sweats: no sweat visible Visual Disturbances: not present Anxiety: no anxiety, at ease Headache,  Fullness in Head: none present Agitation: normal activity Orientation and Clouding of Sensorium: disoriented for date by more than 2 calendar days CIWA-Ar Total: 3 COWS:    Allergies: No Known Allergies  Home Medications:  (Not in a hospital admission)  OB/GYN Status:  No LMP recorded (lmp unknown).  General Assessment Data Assessment unable to be completed: Yes Reason for not completing assessment: Consult ordered at 23:16.  Pt's BAL is 526. Pt was given Ativan due to agitation at 23:15. Pt is sleeping at this time and unable to be aroused. Pt cannot be assessed at this time. TTS will assess once pt is alert.  Location of Assessment: [facility] TTS Assessment: In system Is this a Tele or Face-to-Face Assessment?: Face-to-Face Is this an Initial Assessment or a Re-assessment for this encounter?: Initial Assessment Marital status: Single Maiden name: Adriana Simas(Esau) Is patient pregnant?: No Pregnancy Status: No Living Arrangements: Parent Can pt return to current living arrangement?: Yes Admission Status: Involuntary Is patient capable of signing voluntary admission?: Yes Referral Source: Self/Family/Friend Insurance type: (self-pay)     Crisis Care Plan Living Arrangements: Parent Legal Guardian: Other:(self) Name of Psychiatrist: (none) Name of Therapist: (none)  Education Status Is patient currently in school?: No Is the patient employed, unemployed or receiving disability?: Unemployed  Risk to self with the past 6 months Suicidal Ideation: No(denies, but it is reported that she tried to OD on Potassium) Has patient been a risk to self within the past 6 months prior to admission? : Yes([facility] Suicidal Intent: No(denies) Has patient had any suicidal intent within the past 6 months prior to admission? : No Is patient at risk for suicide?: Yes(unemployed and financial problems) Suicidal Plan?: Yes-Currently Present Has patient had any suicidal plan within the past 6 months prior to admission? : No Specify Current Suicidal Plan: (overdose on potassium) Access to Means: Yes Specify Access to Suicidal Means: (prescription potassium) What has been your use of drugs/alcohol within the last 12 months?: (states that she is drinking 2-3 times weekly, minimizing) Previous Attempts/Gestures: No How many times?: (denies) Other Self Harm Risks: (hx of depression, unemployed and financial  problems) Triggers for Past Attempts: None known Intentional Self Injurious Behavior: None Family Suicide History: No Recent stressful life event(s): Job Loss, Financial Problems Persecutory voices/beliefs?: No Depression: Yes Depression Symptoms: Despondent, Isolating, Loss of interest in usual pleasures, Feeling worthless/self pity Substance abuse history and/or treatment for substance abuse?: Yes Suicide prevention information given to non-admitted patients: Not applicable  Risk to Others within the past 6 months Homicidal Ideation: No Does patient have any lifetime risk of violence toward others beyond the six months prior to admission? : No Thoughts of Harm to Others: No Current Homicidal Intent: No Current Homicidal Plan: No Access to Homicidal Means: No Identified Victim: none History of harm to others?: No Assessment of Violence: None Noted Violent Behavior Description: none Does patient have access to weapons?: No Criminal Charges Pending?: No Does patient have a court date: No Is patient on probation?: Yes  Psychosis Hallucinations: None noted Delusions: None noted  Mental Status Report Appearance/Hygiene: Unremarkable Eye Contact: Fair Motor Activity: Unremarkable Speech: Unremarkable Level of Consciousness: Alert Mood: Depressed Affect: Flat Anxiety Level: None Thought Processes: Coherent Judgement: Impaired Orientation: Person, Place, Time, Situation Obsessive Compulsive Thoughts/Behaviors: None     ADLScreening [facility] Assessment Services) Patient's cognitive ability adequate to safely complete daily activities?: Yes Patient able to express need for assistance with ADLs?: Yes Independently performs ADLs?: Yes (  appropriate for developmental age)  Prior Inpatient Therapy Prior Inpatient Therapy: Yes Prior Therapy Dates: (2019) Prior Therapy Facilty/Provider(s): ([facility]) Reason for Treatment: detox  Prior Outpatient Therapy Prior  Outpatient Therapy: No Does patient have an ACCT team?: No Does patient have Intensive In-House Services?  : No Does patient have [facility] services? : No Does patient have P4CC services?: No  ADL Screening (condition at time of admission) Patient's cognitive ability adequate to safely complete daily activities?: Yes Is the patient deaf or have difficulty hearing?: No Does the patient have difficulty seeing, even when wearing glasses/contacts?: No Does the patient have difficulty concentrating, remembering, or making decisions?: No Patient able to express need for assistance with ADLs?: Yes Does the patient have difficulty dressing or bathing?: No Independently performs ADLs?: Yes (appropriate for developmental age) Does the patient have difficulty walking or climbing stairs?: No Weakness of Legs: None Weakness of Arms/Hands: None     Therapy Consults (therapy consults require a physician order) PT Evaluation Needed: No OT Evalulation Needed: No SLP Evaluation Needed: No Abuse/Neglect Assessment (Assessment to be complete while patient is alone) Abuse/Neglect Assessment Can Be Completed: Yes Physical Abuse: Denies Verbal Abuse: Denies Sexual Abuse: Denies Exploitation of patient/patient's resources: Denies Self-Neglect: Denies Values / Beliefs Cultural Requests During Hospitalization: None Spiritual Requests During Hospitalization: None Consults Spiritual Care Consult Needed: No Social Work Consult Needed: No Merchant navy officer (For Healthcare) Does Patient Have a Medical Advance Directive?: No Would patient like information on creating a medical advance directive?: No - Patient declined Nutrition Screen- [facility] Adult/[facility] Has the patient recently lost weight without trying?: No Has the patient been eating poorly because of a decreased appetite?: No Malnutrition Screening Tool Score: 0  Additional Information 1:1 In Past 12 Months?: No CIRT Risk: No Elopement Risk: No Does  patient have medical clearance?: Yes     Disposition: Per Elta Guadeloupe, NP, Inpatient Treatment is recommended. Disposition Initial Assessment Completed for this Encounter: Yes Disposition of Patient: Admit(Laurie Arville Care, NP Recommends Inpt Treatment) Type of inpatient treatment program: Adult  On Site Evaluation by:   Reviewed with Physician:    Arnoldo Lenis Ayman Brull 01/30/2018 9:09 AM

## 2018-01-30 NOTE — ED Notes (Signed)
Patient sleeping, not exhibiting any symptoms of ETOH withdrawal.  Will reassess when patient awakens.

## 2018-01-30 NOTE — ED Notes (Signed)
SBAR Report received from previous nurse. Pt received calm and visible on unit. Pt denies current SI/ HI, A/V H, depression, anxiety, or pain at this time, and appears otherwise stable and free of distress. Pt reminded of camera surveillance, q 15 min rounds, and rules of the milieu. Will continue to assess. 

## 2018-01-30 NOTE — ED Notes (Signed)
Father brought in overnight bag for pt which contained: 1 dark wallet, 1 phone, 1 key 12 tshrt,2 sports bra' 2 pr socks., 2 under[ants, 1 pr slides, 1 bag toiletries. 0 cash, 0 charging cord

## 2018-01-30 NOTE — ED Notes (Addendum)
Patient calm and cooperative.  She is IVC'd for having suicidal thoughts.  Sitter at bedside.  Patient ate breakfast and taking fluids well. Resting in bed.

## 2018-01-30 NOTE — ED Notes (Signed)
Patient back to room 26 in TCU.  Nurse reported that all belongings had been sent home with Mom.

## 2018-01-30 NOTE — ED Notes (Signed)
Patient given specimen cup to collect urine sample.  Reported she couldn't give specimen at this time but would try later.

## 2018-01-31 ENCOUNTER — Encounter (HOSPITAL_COMMUNITY): Payer: Self-pay

## 2018-01-31 ENCOUNTER — Inpatient Hospital Stay (HOSPITAL_COMMUNITY)
Admission: AD | Admit: 2018-01-31 | Discharge: 2018-02-02 | DRG: 897 | Disposition: A | Discharge status: Home / Self Care | Payer: Federal, State, Local not specified - Other | Source: Intra-hospital | Attending: Psychiatry | Admitting: Psychiatry

## 2018-01-31 ENCOUNTER — Other Ambulatory Visit: Payer: Self-pay

## 2018-01-31 DIAGNOSIS — F332 Major depressive disorder, recurrent severe without psychotic features: Secondary | ICD-10-CM | POA: Diagnosis present

## 2018-01-31 DIAGNOSIS — Z560 Unemployment, unspecified: Secondary | ICD-10-CM | POA: Diagnosis not present

## 2018-01-31 DIAGNOSIS — F10239 Alcohol dependence with withdrawal, unspecified: Secondary | ICD-10-CM | POA: Diagnosis not present

## 2018-01-31 DIAGNOSIS — F10230 Alcohol dependence with withdrawal, uncomplicated: Secondary | ICD-10-CM | POA: Diagnosis present

## 2018-01-31 LAB — I-STAT BETA HCG BLOOD, ED (MC, WL, AP ONLY): I-stat hCG, quantitative: <5 m[IU]/mL (ref <5)

## 2018-01-31 MED ORDER — VITAMIN B-1 100 MG PO TABS
100.0000 mg | ORAL_TABLET | Freq: Every day | ORAL | Status: DC
  Administered 2018-02-01 – 2018-02-02 (×2): 100 mg via ORAL
  Filled 2018-01-31 (×4): qty 1

## 2018-01-31 MED ORDER — LORAZEPAM 1 MG PO TABS: 1.0000 mg | ORAL_TABLET | Freq: Every day | ORAL | Status: DC

## 2018-01-31 MED ORDER — POTASSIUM CHLORIDE ER 20 MEQ PO TBCR: 20.0000 meq | EXTENDED_RELEASE_TABLET | Freq: Every day | ORAL | Status: DC

## 2018-01-31 MED ORDER — LOPERAMIDE HCL 2 MG PO CAPS: 2.0000 mg | ORAL_CAPSULE | ORAL | Status: DC | PRN

## 2018-01-31 MED ORDER — LORAZEPAM 1 MG PO TABS
1.0000 mg | ORAL_TABLET | Freq: Four times a day (QID) | ORAL | Status: AC
  Administered 2018-01-31 (×3): 1 mg via ORAL
  Filled 2018-01-31 (×3): qty 1

## 2018-01-31 MED ORDER — TRAZODONE HCL 50 MG PO TABS
50.0000 mg | ORAL_TABLET | Freq: Every evening | ORAL | Status: DC | PRN
  Administered 2018-01-31 – 2018-02-01 (×2): 50 mg via ORAL
  Filled 2018-01-31: qty 1
  Filled 2018-01-31: qty 7
  Filled 2018-01-31: qty 1

## 2018-01-31 MED ORDER — ONDANSETRON 4 MG PO TBDP: 4.0000 mg | ORAL_TABLET | Freq: Four times a day (QID) | ORAL | Status: DC | PRN

## 2018-01-31 MED ORDER — LORAZEPAM 1 MG PO TABS: 1.0000 mg | ORAL_TABLET | Freq: Two times a day (BID) | ORAL | Status: DC

## 2018-01-31 MED ORDER — MAGNESIUM HYDROXIDE 400 MG/5ML PO SUSP: 30.0000 mL | Freq: Every day | ORAL | Status: DC | PRN

## 2018-01-31 MED ORDER — ACETAMINOPHEN 325 MG PO TABS: 650.0000 mg | ORAL_TABLET | Freq: Four times a day (QID) | ORAL | Status: DC | PRN

## 2018-01-31 MED ORDER — LORAZEPAM 1 MG PO TABS
1.0000 mg | ORAL_TABLET | Freq: Three times a day (TID) | ORAL | Status: AC
  Administered 2018-02-01 (×2): 1 mg via ORAL
  Filled 2018-01-31 (×3): qty 1

## 2018-01-31 MED ORDER — THIAMINE HCL 100 MG/ML IJ SOLN: 100.0000 mg | Freq: Once | INTRAMUSCULAR | Status: DC

## 2018-01-31 MED ORDER — POTASSIUM CHLORIDE CRYS ER 20 MEQ PO TBCR
20.0000 meq | EXTENDED_RELEASE_TABLET | Freq: Every day | ORAL | Status: DC
  Administered 2018-01-31 – 2018-02-02 (×3): 20 meq via ORAL
  Filled 2018-01-31 (×6): qty 1

## 2018-01-31 MED ORDER — FLUOXETINE HCL 10 MG PO CAPS
10.0000 mg | ORAL_CAPSULE | Freq: Every day | ORAL | Status: DC
  Administered 2018-01-31: 10 mg via ORAL
  Filled 2018-01-31: qty 1

## 2018-01-31 MED ORDER — HYDROXYZINE HCL 25 MG PO TABS
25.0000 mg | ORAL_TABLET | Freq: Four times a day (QID) | ORAL | Status: DC | PRN
  Administered 2018-02-01: 25 mg via ORAL
  Filled 2018-01-31: qty 10
  Filled 2018-01-31: qty 1

## 2018-01-31 MED ORDER — ADULT MULTIVITAMIN W/MINERALS CH
1.0000 | ORAL_TABLET | Freq: Every day | ORAL | Status: DC
  Administered 2018-01-31 – 2018-02-02 (×3): 1 via ORAL
  Filled 2018-01-31 (×6): qty 1

## 2018-01-31 MED ORDER — LORAZEPAM 1 MG PO TABS: 1.0000 mg | ORAL_TABLET | Freq: Four times a day (QID) | ORAL | Status: DC | PRN

## 2018-01-31 MED ORDER — ALUM & MAG HYDROXIDE-SIMETH 200-200-20 MG/5ML PO SUSP: 30.0000 mL | ORAL | Status: DC | PRN

## 2018-01-31 NOTE — Progress Notes (Addendum)
[facility]  Roosvelt HarpsJackie Norman, DO, is the accepting provider.  Landry MellowGreg Clary, MD is the attending provider.  Call report to [phone]  [email or web address] notified.   Pt is IVC'd Pt may be transported by [employer] Pt scheduled  to arrive at as soon as transport can be arranged.  Timmothy EulerJean T. Kaylyn LimSutter, MSW, LCSWA Disposition Clinical Social Work [phone] (cell) [phone] (office)

## 2018-01-31 NOTE — Plan of Care (Signed)
Pt progressing in the following metrics  Problem: Education: Goal: Knowledge of [facility] Education information/materials will improve Outcome: Progressing Goal: Emotional status will improve Outcome: Progressing Goal: Mental status will improve Outcome: Progressing Goal: Verbalization of understanding the information provided will improve Outcome: Progressing   

## 2018-01-31 NOTE — Tx Team (Signed)
Initial Treatment Plan 01/31/2018 1:40 PM Ames DuraLeslie Leiner [id number]    PATIENT STRESSORS: Financial difficulties Occupational concerns Substance abuse   PATIENT STRENGTHS: Ability for insight Active sense of humor Average or above average intelligence Motivation for treatment/growth   PATIENT IDENTIFIED PROBLEMS: "abstain from alcohol and become clean"  "work on Pharmacologistcoping skills for when I'm sad"                   DISCHARGE CRITERIA:  Ability to meet basic life and health needs Improved stabilization in mood, thinking, and/or behavior Reduction of life-threatening or endangering symptoms to within safe limits  PRELIMINARY DISCHARGE PLAN: Return to previous living arrangement  PATIENT/FAMILY INVOLVEMENT: This treatment plan has been presented to and reviewed with the patient, Ames DuraLeslie Dobkins.  The patient and family have been given the opportunity to ask questions and make suggestions.  Raylene MiyamotoMichael R Dorota Heinrichs, RN 01/31/2018, 1:40 PM

## 2018-01-31 NOTE — ED Notes (Signed)
[facility]  Police called for transport.

## 2018-01-31 NOTE — B[facility] Suicide Risk Assessment (Signed)
[facility]   Nursing information obtained from:  Patient Demographic factors:  Caucasian, Unemployed, Low socioeconomic status Current Mental Status:  Suicidal ideation indicated by others, Self-harm behaviors Loss Factors:  Financial problems / change in socioeconomic status Historical Factors:  Impulsivity Risk Reduction Factors:  Positive coping skills or problem solving skills, Living with another person, especially a relative, Sense of responsibility to family  Total Time spent with patient: 30 minutes Principal Problem: <principal problem not specified> Diagnosis:   Patient Active Problem List   Diagnosis Date Noted  . Alcohol dependence with withdrawal (HCC) [F10.239] 01/31/2018  . Major depressive disorder, recurrent, severe without psychotic features (HCC) [F33.2] 01/31/2018   Subjective Data: Patient is seen and examined.  Patient is a 37 year old female with a long-standing past psychiatric history significant for alcohol dependence who apparently [facility]3 after what was thought to be an intentional overdose of potassium.  The ER note stated that the patient had told "someone" that she took potassium pills in an attempt to end her life.  She had been drinking at the time.  Her blood alcohol was 526.  Her potassium was 4.8.  But the patient was kept in the emergency room.  She was given Ativan.  She apparently had episodes of being significantly sedated, and also received Ativan due to agitation.  Psychiatric evaluation at that time was difficult.  According to the notes from the evaluation patient had ingested potassium and a possible suicide attempt while drinking alcohol.  Patient's friend stated that the patient indicated she told them that she took an overdose in order to end her life.  She admitted to drinking 2-3 mixed drinks 2-3 times a week because she was unable to afford to drink more than that.  However  on the day prior to admission she drank at least 1/5 of liquor.  The patient has [facility] for [facility] as well as alcohol related issues including an aspiration pneumonia.  The patient in the emergency room denied that she was depressed.  The patient is currently asking to be discharged home.  She stated she was not suicidal.  She stated she knows she is an alcoholic, and is hoping to have an opportunity to either go back to [facility] for alcohol treatment or perhaps [facility].  She did admit that she had a history of alcohol related seizures, and has had an alcohol related delirium in the past.  There was some concern for some cardiac issues that may have been related to the alcohol, but an echocardiogram performed on 08/30/2017 showed essentially normal cardiac function.  She was admitted to the hospital for evaluation and stabilization.  Continued Clinical Symptoms:  Alcohol Use Disorder Identification Test Final Score (AUDIT): 19 The "Alcohol Use Disorders Identification Test", Guidelines for Use in Primary Care, Second Edition.  World Science writerHealth Organization [facility](WHO). Score between 0-7:  no or low risk or alcohol related problems. Score between 8-15:  moderate risk of alcohol related problems. Score between 16-19:  high risk of alcohol related problems. Score 20 or above:  warrants further diagnostic evaluation for alcohol dependence and treatment.   CLINICAL FACTORS:   Alcohol/Substance Abuse/Dependencies   Musculoskeletal: Strength & Muscle Tone: within normal limits Gait & Station: normal Patient leans: N/A  Psychiatric Specialty Exam: Physical Exam  Nursing note and vitals reviewed. Constitutional: She is oriented to person, place, and time. She appears well-developed and well-nourished.  HENT:  Head: Normocephalic and atraumatic.  Respiratory: Effort normal.  Neurological: She is alert and oriented to person, place, and time.    ROS  Blood pressure  118/84, pulse 96, temperature 99.1 F (37.3 C), temperature source Oral, resp. rate 15, height 5\' 10"  (1.778 m), weight 61.2 kg (135 lb), SpO2 100 %.Body mass index is 19.37 kg/m.  General Appearance: Casual  Eye Contact:  Fair  Speech:  Normal Rate  Volume:  Normal  Mood:  Anxious  Affect:  Congruent  Thought Process:  Coherent  Orientation:  Full (Time, Place, and Person)  Thought Content:  Logical  Suicidal Thoughts:  No  Homicidal Thoughts:  No  Memory:  Immediate;   Fair Recent;   Fair Remote;   Fair  Judgement:  Impaired  Insight:  Lacking  Psychomotor Activity:  Increased  Concentration:  Concentration: Fair and Attention Span: Fair  Recall:  [employer] of Knowledge:  Fair  Language:  Fair  Akathisia:  Negative  Handed:  Right  AIMS (if indicated):     Assets:  Desire for Improvement Housing Physical Health Resilience  ADL's:  Intact  Cognition:  WNL  Sleep:         COGNITIVE FEATURES THAT CONTRIBUTE TO RISK:  None    SUICIDE RISK:   Minimal: No identifiable suicidal ideation.  Patients presenting with no risk factors but with morbid ruminations; may be classified as minimal risk based on the severity of the depressive symptoms  PLAN OF CARE: Patient is seen and examined.  Patient is a 37 year old female with a past psychiatric history significant for alcohol dependence.  She reportedly took an overdose of potassium.'s her friends told emergency room staff that this was an intentional overdose to kill her self.  She does have a history of hypokalemia, and takes potassium regularly.  Her potassium in the emergency room was 4.8.  She has a long history of alcohol dependence.  Her blood alcohol on admission was 526.  She has a history of complicated alcohol withdrawal including delirium and seizures.  She denies suicidal ideation but is interested in being referred to alcohol rehab.  Her last rehabilitation stay was in 2018.  She states she was able to stay sober for  60 to 70 days.  She denied that the potassium was an overdose.  She denied suicidal ideation.  She will be admitted to the hospital.  She will be monitored for withdrawal symptoms.  She will be placed on seizure precautions as well as 15-minute checks for safety.  We will contact her mother and father to get collateral information to see if she is attempted to kill herself in the past.  The notes from [facility] where she is been hospitalized multiple times for alcohol related issues did not show any episodes of depression or suicide attempts that I saw.  We will put her on a Librium taper.  We will monitor for all the things for above.  She is very much interested in getting involved with social work to be able to find some place for substance abuse treatment.  I certify that inpatient services furnished can reasonably be expected to improve the patient's condition.   Antonieta Pert, MD 01/31/2018, 1:31 PM

## 2018-01-31 NOTE — Plan of Care (Signed)
  Problem: Safety: Goal: Periods of time without injury will increase Outcome: Progressing Note:  Pt has not harmed self or others tonight.  She denies SI/HI and verbally contracts for safety.    

## 2018-01-31 NOTE — H&P (Signed)
Psychiatric Admission Assessment Adult  Patient Identification: Sheila Graves MRN:  [id number] Date of Evaluation:  01/31/2018 Chief Complaint:  MDD ALCOHOL USE DISORDER Principal Diagnosis: <principal problem not specified> Diagnosis:   Patient Active Problem List   Diagnosis Date Noted  . Alcohol dependence with withdrawal ([place]) [F10.239] 01/31/2018  . Major depressive disorder, recurrent, severe without psychotic features ([place]) [F33.2] 01/31/2018   History of Present Illness: Patient is seen and examined.  Patient is a 37 year old female with a long-standing past psychiatric history significant for alcohol dependence who apparently presented to the [facility] emergency department on 8/3 after what was thought to be an intentional overdose of potassium.  Emergency room note stated that the patient had told "someone" that she took potassium pills in an attempt to end her life.  She also was drinking at that time.  In the emergency room her blood alcohol was 526.  Her potassium was 4.8.  The patient was kept in the emergency room for 2 days.  Attempts to evaluate the patient from a psychiatric perspective were delayed by agitation as well as oversedation from alcohol, Ativan and withdrawal.  The patient's friend stated that the patient indicated that she had taken potassium and an overdose to end her life.  She admitted to drinking 2-3 mixed drinks 2-3 times a week, and that was because she was unable to afford to drink any more than that.  She is currently unemployed.  However on the day prior to admission she drank at least 1/5 of liquor.  The patient has been hospitalized multiple times to the [facility] for detox as well as alcohol related issues.  She is had alcohol related delirium, and alcohol related seizure as well as aspiration pneumonia.  In the emergency department the patient denied that she was depressed nor that she was suicidal.  At that time she was requesting to be  discharged home.  She lives with her parents.  She stated that she knew she was an alcoholic and was hoping to have an opportunity either to go back to a [facility] facility, or perhaps [facility].  She stated she thought she would be discharged from the emergency room and was surprised to have been brought here.  She was admitted to the hospital for evaluation and stabilization. Associated Signs/Symptoms: Depression Symptoms:  insomnia, fatigue, difficulty concentrating, anxiety, disturbed sleep, (Hypo) Manic Symptoms:  Impulsivity, Anxiety Symptoms:  Excessive Worry, Psychotic Symptoms:  Denied PTSD Symptoms: Negative Total Time spent with patient: 30 minutes  Past Psychiatric History: Patient has multiple hospitalizations at [facility] for alcohol-related issues.  She stated that she has been in rehabilitation 3 or 4 times.  Her last alcohol rehabilitation stay was in 2018.  She stated she had been sober for approximately 60 to 70 days.  Is the patient at risk to self? No.  Has the patient been a risk to self in the past 6 months? No.  Has the patient been a risk to self within the distant past? No.  Is the patient a risk to others? No.  Has the patient been a risk to others in the past 6 months? No.  Has the patient been a risk to others within the distant past? No.   Prior Inpatient Therapy:   Prior Outpatient Therapy:    Alcohol Screening: 1. How often do you have a drink containing alcohol?: 2 to 4 times a month 2. How many drinks containing alcohol do you have on  a typical day when you are drinking?: 5 or 6 3. How often do you have six or more drinks on one occasion?: Weekly AUDIT-C Score: 7 4. How often during the last year have you found that you were not able to stop drinking once you had started?: Weekly 5. How often during the last year have you failed to do what was normally expected from you becasue of drinking?: Monthly 6. How often during the last year have you  needed a first drink in the morning to get yourself going after a heavy drinking session?: Never 7. How often during the last year have you had a feeling of guilt of remorse after drinking?: Monthly 8. How often during the last year have you been unable to remember what happened the night before because you had been drinking?: Less than monthly 9. Have you or someone else been injured as a result of your drinking?: No 10. Has a relative or friend or a doctor or another health worker been concerned about your drinking or suggested you cut down?: Yes, during the last year Alcohol Use Disorder Identification Test Final Score (AUDIT): 19 Substance Abuse History in the last 12 months:  Yes.   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 Family Consequences:  Family is concerned about her safety, she is been unemployed and loses jobs rapidly, and she lives with her family at her age. DT's: She reportedly has had seizures and delirium in the past. Withdrawal Symptoms:   Diaphoresis Diarrhea Headaches Nausea Tremors Vomiting Previous Psychotropic Medications: Yes  Psychological Evaluations: Yes  Past Medical History:  Past Medical History:  Diagnosis Date  . Alcohol withdrawal seizure ([place])   . ETOH abuse   . Hypokalemia   . Pancreatitis   . Thyroid disease     Past Surgical History:  Procedure Laterality Date  . FINGER SURGERY     Family History: History reviewed. No pertinent family history. Family Psychiatric  History: In her direct family negative, 1 of her grandparents was an alcoholic. Tobacco Screening:   Social History:  Social History   Substance and Sexual Activity  Alcohol Use Yes   Comment: states that she drinks a couple drinks 2-3 times weekly     Social History   Substance and Sexual Activity  Drug Use No    Additional Social History:                            Allergies:  No Known Allergies Lab Results:  Results for orders placed or performed during the hospital encounter of 01/29/18 (from the past 48 hour(s))  Comprehensive metabolic panel     Status: Abnormal   Collection Time: 01/29/18  5:23 PM  Result Value Ref Range   Sodium 146 (H) 135 - 145 mmol/L   Potassium 4.8 3.5 - 5.1 mmol/L   Chloride 108 98 - 111 mmol/L   CO2 24 22 - 32 mmol/L   Glucose, Bld 94 70 - 99 mg/dL   BUN 13 6 - 20 mg/dL   Creatinine, Ser 0.83 0.44 - 1.00 mg/dL   Calcium 8.4 (L) 8.9 - 10.3 mg/dL   Total Protein 6.9 6.5 - 8.1 g/dL   Albumin 4.2 3.5 - 5.0 g/dL   AST 43 (H) 15 - 41 U/L   ALT 29 0 - 44 U/L   Alkaline Phosphatase 67 38 - 126 U/L  Total Bilirubin 0.9 0.3 - 1.2 mg/dL   GFR calc non Af Amer >60 >60 mL/min   GFR calc Af Amer >60 >60 mL/min    Comment: (NOTE) The eGFR has been calculated using the CKD EPI equation. This calculation has not been validated in all clinical situations. eGFR's persistently <60 mL/min signify possible Chronic Kidney Disease.    Anion gap 14 5 - 15    Comment: Performed at [facility], [place] [address] [place], [place] [address]  Ethanol     Status: Abnormal   Collection Time: 01/29/18  5:23 PM  Result Value Ref Range   Alcohol, Ethyl (B) 526 (HH) <10 mg/dL    Comment: CRITICAL RESULT CALLED TO, READ BACK BY AND VERIFIED WITH: TALKINGTON,J AT 4097 ON [id number] BY HOOKER,B (NOTE) Lowest detectable limit for serum alcohol is 10 mg/dL. For medical purposes only. Performed at [facility], [place] [address], [place], [place] [address]   Urine rapid drug screen (hosp performed)     Status: Abnormal   Collection Time: 01/29/18  5:23 PM  Result Value Ref Range   Opiates (A) NONE DETECTED    Result not available. Reagent lot number recalled by manufacturer.   Cocaine NONE DETECTED NONE DETECTED   Benzodiazepines NONE DETECTED NONE DETECTED   Amphetamines NONE DETECTED NONE DETECTED    Tetrahydrocannabinol NONE DETECTED NONE DETECTED   Barbiturates NONE DETECTED NONE DETECTED    Comment: Performed at [facility], [place] [address], [place], [place] [address]  CBC with Diff     Status: Abnormal   Collection Time: 01/29/18  5:23 PM  Result Value Ref Range   WBC 4.0 4.0 - 10.5 K/uL   RBC 4.50 3.87 - 5.11 MIL/uL   Hemoglobin 15.5 (H) 12.0 - 15.0 g/dL   HCT 45.3 36.0 - 46.0 %   MCV 100.7 (H) 78.0 - 100.0 fL   MCH 34.4 (H) 26.0 - 34.0 pg   MCHC 34.2 30.0 - 36.0 g/dL   RDW 13.7 11.5 - 15.5 %   Platelets 208 150 - 400 K/uL   Neutrophils Relative % 42 %   Neutro Abs 1.7 1.7 - 7.7 K/uL   Lymphocytes Relative 53 %   Lymphs Abs 2.1 0.7 - 4.0 K/uL   Monocytes Relative 4 %   Monocytes Absolute 0.2 0.1 - 1.0 K/uL   Eosinophils Relative 0 %   Eosinophils Absolute 0.0 0.0 - 0.7 K/uL   Basophils Relative 1 %   Basophils Absolute 0.0 0.0 - 0.1 K/uL    Comment: Performed at [facility], [place] [address] [place], [place] [address]  Acetaminophen level     Status: Abnormal   Collection Time: 01/29/18  5:23 PM  Result Value Ref Range   Acetaminophen (Tylenol), Serum <10 (L) 10 - 30 ug/mL    Comment: (NOTE) Therapeutic concentrations vary significantly. A range of 10-30 ug/mL  may be an effective concentration for many patients. However, some  are best treated at concentrations outside of this range. Acetaminophen concentrations >150 ug/mL at 4 hours after ingestion  and >50 ug/mL at 12 hours after ingestion are often associated with  toxic reactions. Performed at [facility], [place] [address], [place], [place] [address]   Salicylate level     Status: None   Collection Time: 01/29/18  5:23 PM  Result Value Ref Range   Salicylate Lvl <1.9 2.8 - 30.0 mg/dL    Comment: Performed at [facility], [place] [address], [place], [place] [address]  Urinalysis, Routine w reflex microscopic     Status: None    Collection Time: 01/30/18 12:04 PM  Result Value Ref Range   Color, Urine YELLOW YELLOW   APPearance CLEAR CLEAR   Specific Gravity, Urine 1.014 1.005 - 1.030   pH 7.0 5.0 - 8.0   Glucose, UA NEGATIVE NEGATIVE mg/dL   Hgb urine dipstick NEGATIVE NEGATIVE   Bilirubin Urine NEGATIVE NEGATIVE   Ketones, ur NEGATIVE NEGATIVE mg/dL   Protein, ur NEGATIVE NEGATIVE mg/dL   Nitrite NEGATIVE NEGATIVE   Leukocytes, UA NEGATIVE NEGATIVE    Comment: Performed at [place] [address], [place], [place] [address]  Pregnancy, urine     Status: None   Collection Time: 01/30/18 12:04 PM  Result Value Ref Range   Preg Test, Ur NEGATIVE NEGATIVE    Comment:        THE SENSITIVITY OF THIS METHODOLOGY IS >20 mIU/mL. Performed at [facility], [place] [address] [place], [place] [address]     Blood Alcohol level:  Lab Results  Component Value Date   ETH [facility]) 35/70/1779    Metabolic Disorder Labs:  No results found for: HGBA1C, MPG No results found for: PROLACTIN No results found for: CHOL, TRIG, HDL, CHOLHDL, VLDL, LDLCALC  Current Medications: Current Facility-Administered Medications  Medication Dose Route Frequency Provider Last Rate Last Dose  . acetaminophen (TYLENOL) tablet 650 mg  650 mg Oral Q6H PRN Sharma Covert, MD      . alum & mag hydroxide-simeth (MAALOX/MYLANTA) 200-200-20 MG/5ML suspension 30 mL  30 mL Oral Q4H PRN Sharma Covert, MD      . hydrOXYzine (ATARAX/VISTARIL) tablet 25 mg  25 mg Oral Q6H PRN Sharma Covert, MD      . loperamide (IMODIUM) capsule 2-4 mg  2-4 mg Oral PRN Sharma Covert, MD      . LORazepam (ATIVAN) tablet 1 mg  1 mg Oral Q6H PRN Sharma Covert, MD      . LORazepam (ATIVAN) tablet 1 mg  1 mg Oral QID Sharma Covert, MD       Followed by  . [START ON 02/01/2018] LORazepam (ATIVAN) tablet 1 mg  1 mg Oral TID Sharma Covert, MD       Followed by  . [START ON 02/02/2018] LORazepam  (ATIVAN) tablet 1 mg  1 mg Oral BID Sharma Covert, MD       Followed by  . [START ON 02/04/2018] LORazepam (ATIVAN) tablet 1 mg  1 mg Oral Daily Cassidie Veiga, Cordie Grice, MD      . magnesium hydroxide (MILK OF MAGNESIA) suspension 30 mL  30 mL Oral Daily PRN Sharma Covert, MD      . multivitamin with minerals tablet 1 tablet  1 tablet Oral Daily Sharma Covert, MD      . ondansetron (ZOFRAN-ODT) disintegrating tablet 4 mg  4 mg Oral Q6H PRN Sharma Covert, MD      . Potassium Chloride ER TBCR 20 mEq  20 mEq Oral Daily Sharma Covert, MD      . Derrill Memo ON 02/01/2018] thiamine (VITAMIN B-1) tablet 100 mg  100 mg Oral Daily Sharma Covert, MD       PTA Medications: Medications Prior to Admission  Medication Sig Dispense Refill Last Dose  . potassium chloride 20 MEQ TBCR Take 20 mEq by mouth daily. 30 tablet 0  01/31/2018 at Unknown time    Musculoskeletal: Strength & Muscle Tone: within normal limits Gait & Station: normal Patient leans: N/A  Psychiatric Specialty Exam: Physical Exam  Nursing note and vitals reviewed. Constitutional: She is oriented to person, place, and time. She appears well-developed and well-nourished.  HENT:  Head: Normocephalic and atraumatic.  Respiratory: Effort normal.  Neurological: She is alert and oriented to person, place, and time.    ROS  Blood pressure 118/84, pulse 96, temperature 99.1 F (37.3 C), temperature source Oral, resp. rate 15, height '5\' 10"'$  (1.778 m), weight 61.2 kg (135 lb), SpO2 100 %.Body mass index is 19.37 kg/m.  General Appearance: Casual  Eye Contact:  Fair  Speech:  Normal Rate  Volume:  Normal  Mood:  Anxious  Affect:  Congruent  Thought Process:  Coherent  Orientation:  Full (Time, Place, and Person)  Thought Content:  Logical  Suicidal Thoughts:  No  Homicidal Thoughts:  No  Memory:  Immediate;   Fair Recent;   Fair Remote;   Fair  Judgement:  Impaired  Insight:  Lacking  Psychomotor Activity:  Increased   Concentration:  Concentration: Fair and Attention Span: Fair  Recall:  [employer] of Knowledge:  Fair  Language:  Fair  Akathisia:  Negative  Handed:  Right  AIMS (if indicated):     Assets:  Desire for Improvement Housing Social Support  ADL's:  Intact  Cognition:  WNL  Sleep:       Treatment Plan Summary: Daily contact with patient to assess and evaluate symptoms and progress in treatment, Medication management and Plan Patient is seen and examined.  Patient is a 37 year old female with the above-stated past psychiatric history who was admitted after transfer from the [facility] emergency department for alcohol dependence, alcohol withdrawal.  There was also concern for suicide attempt by overdose of potassium.  Her potassium was 4.8 which is the normal limit.  Her blood alcohol was 561.  She will be admitted to the unit.  She will be integrated into the milieu.  Because of her history of delirium and seizure she will be placed on withdrawal precautions as well as seizure precautions.  She will be on 15-minute checks her suicidality.  We will contact her family for collateral information.  She will be placed on an alcohol withdrawal protocol.  She will also receive B12, folate and a multivitamin.  She is hoping to get into a rehabilitation facility.  She will talk with social work both individually, and in groups.  She will be and encouraged to attend groups.  Observation Level/Precautions:  Detox 15 minute checks Seizure  Laboratory:  Chemistry Profile  Psychotherapy:    Medications:    Consultations:    Discharge Concerns:    Estimated LOS:  Other:     Physician Treatment Plan for Primary Diagnosis: <principal problem not specified> Long Term Goal(s): Improvement in symptoms so as ready for discharge  Short Term Goals: Ability to identify changes in lifestyle to reduce recurrence of condition will improve, Ability to verbalize feelings will improve, Ability to disclose and  discuss suicidal ideas, Ability to demonstrate self-control will improve, Ability to identify and develop effective coping behaviors will improve, Ability to maintain clinical measurements within normal limits will improve and Ability to identify triggers associated with substance abuse/mental health issues will improve  Physician Treatment Plan for Secondary Diagnosis: Active Problems:   Alcohol dependence with withdrawal ([place])  Long Term Goal(s): Improvement in symptoms so as ready  for discharge  Short Term Goals: Ability to identify changes in lifestyle to reduce recurrence of condition will improve, Ability to verbalize feelings will improve, Ability to disclose and discuss suicidal ideas, Ability to demonstrate self-control will improve, Ability to identify and develop effective coping behaviors will improve, Ability to maintain clinical measurements within normal limits will improve and Ability to identify triggers associated with substance abuse/mental health issues will improve  I certify that inpatient services furnished can reasonably be expected to improve the patient's condition.    Sharma Covert, MD 8/5/20191:45 PM

## 2018-01-31 NOTE — BH Assessment (Signed)
[facility]  Per Juanetta BeetsJacqueline Norman, DO, this pt requires psychiatric hospitalization.  Berneice Heinrichina Tate, RN, [facility].  Pt presents under IVC initiated by EDP Melene Planan Floyd, MD, and [facility]  Pt's nurse, Aram BeechamCynthia, has been notified, and agrees to call report to [phone].  Pt is to be transported via [employer].   Doylene Canninghomas [employer], [place] Behavioral Health Coordinator [phone]

## 2018-01-31 NOTE — Consult Note (Addendum)
[facility] Face-to-Face Psychiatry Consult   Reason for Consult:  Suicide attempt Referring Physician:  EDP Patient Identification: Sheila Graves MRN:  [id number] Principal Diagnosis: Major depressive disorder, recurrent, severe without psychotic features [facility]) Diagnosis:   Patient Active Problem List   Diagnosis Date Noted  . Alcohol dependence with withdrawal ([place]) [F10.239] 01/31/2018    Priority: High  . Major depressive disorder, recurrent, severe without psychotic features ([place]) [F33.2] 01/31/2018    Priority: High    Total Time spent with patient: 45 minutes  Subjective:   Sheila Graves is a 37 y.o. female patient admitted with suicide attempt.  HPI:  37 yo female who presented to the ED after ingesting potassium in a suicide attempt, reported this to friends.  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  Her depression also started about this time and worsens with alcohol use, rest and friends make it better.  Night time makes her depression and alcohol use worse.  No hallucinations or homicidal ideations today.  She did go to rehab last year and was sober for two months afterwards.  Inpatient psychiatric care being sought.  Past Psychiatric History: depression, substance abuse  Risk to Self: Suicidal Ideation: No(denies, but it is reported that she tried to OD on Potassium) Suicidal Intent: No(denies) Is patient at risk for suicide?: Yes(unemployed and financial problems) Suicidal Plan?: Yes-Currently Present Specify Current Suicidal Plan: (overdose on potassium) Access to Means: Yes Specify Access to Suicidal Means: (prescription potassium) What has been your use of drugs/alcohol within the last 12 months?: (states that she is drinking 2-3 times weekly, minimizing) How many times?: (denies) Other Self Harm Risks: (hx of depression, unemployed and financial problems) Triggers for Past Attempts: None known Intentional Self Injurious Behavior:  None Risk to Others: Homicidal Ideation: No Thoughts of Harm to Others: No Current Homicidal Intent: No Current Homicidal Plan: No Access to Homicidal Means: No Identified Victim: none History of harm to others?: No Assessment of Violence: None Noted Violent Behavior Description: none Does patient have access to weapons?: No Criminal Charges Pending?: No Does patient have a court date: No Prior Inpatient Therapy: Prior Inpatient Therapy: Yes Prior Therapy Dates: (2019) Prior Therapy Facilty/Provider(s): ([facility]) Reason for Treatment: detox Prior Outpatient Therapy: Prior Outpatient Therapy: No Does patient have an ACCT team?: No Does patient have Intensive In-House Services?  : No Does patient have [facility] services? : No Does patient have P4CC services?: No  Past Medical History:  Past Medical History:  Diagnosis Date  . Alcohol withdrawal seizure ([place])   . ETOH abuse   . Hypokalemia   . Pancreatitis   . Thyroid disease     Past Surgical History:  Procedure Laterality Date  . FINGER SURGERY     Family History: No family history on file. Family Psychiatric  History: none Social History:  Social History   Substance and Sexual Activity  Alcohol Use Yes   Comment: states that she drinks a couple drinks 2-3 times weekly     Social History   Substance and Sexual Activity  Drug Use No    Social History   Socioeconomic History  . Marital status: Single    Spouse name: Not on file  . Number of children: Not on file  . Years of education: Not on file  . Highest education level: Not on file  Occupational History  . Not on file  Social Needs  . Financial resource strain: Not on file  .  Food insecurity:    Worry: Not on file    Inability: Not on file  . Transportation needs:    Medical: Not on file    Non-medical: Not on file  Tobacco Use  . Smoking status: Never Smoker  . Smokeless tobacco: Never Used  Substance and Sexual Activity  . Alcohol  use: Yes    Comment: states that she drinks a couple drinks 2-3 times weekly  . Drug use: No  . Sexual activity: Not on file  Lifestyle  . Physical activity:    Days per week: Not on file    Minutes per session: Not on file  . Stress: Not on file  Relationships  . Social connections:    Talks on phone: Not on file    Gets together: Not on file    Attends religious service: Not on file    Active member of club or organization: Not on file    Attends meetings of clubs or organizations: Not on file    Relationship status: Not on file  Other Topics Concern  . Not on file  Social History Narrative  . Not on file   Additional Social History: N/A    Allergies:  No Known Allergies  Labs:  Results for orders placed or performed during the hospital encounter of 01/29/18 (from the past 48 hour(s))  Comprehensive metabolic panel     Status: Abnormal   Collection Time: 01/29/18  5:23 PM  Result Value Ref Range   Sodium 146 (H) 135 - 145 mmol/L   Potassium 4.8 3.5 - 5.1 mmol/L   Chloride 108 98 - 111 mmol/L   CO2 24 22 - 32 mmol/L   Glucose, Bld 94 70 - 99 mg/dL   BUN 13 6 - 20 mg/dL   Creatinine, Ser 0.83 0.44 - 1.00 mg/dL   Calcium 8.4 (L) 8.9 - 10.3 mg/dL   Total Protein 6.9 6.5 - 8.1 g/dL   Albumin 4.2 3.5 - 5.0 g/dL   AST 43 (H) 15 - 41 U/L   ALT 29 0 - 44 U/L   Alkaline Phosphatase 67 38 - 126 U/L   Total Bilirubin 0.9 0.3 - 1.2 mg/dL   GFR calc non Af Amer >60 >60 mL/min   GFR calc Af Amer >60 >60 mL/min    Comment: (NOTE) The eGFR has been calculated using the CKD EPI equation. This calculation has not been validated in all clinical situations. eGFR's persistently <60 mL/min signify possible Chronic Kidney Disease.    Anion gap 14 5 - 15    Comment: Performed at [facility], [place] [address] [place], [place] [address]  Ethanol     Status: Abnormal   Collection Time: 01/29/18  5:23 PM  Result Value Ref Range   Alcohol, Ethyl (B) 526 (HH) <10  mg/dL    Comment: CRITICAL RESULT CALLED TO, READ BACK BY AND VERIFIED WITH: TALKINGTON,J AT [phone] ON [id number] BY HOOKER,B (NOTE) Lowest detectable limit for serum alcohol is 10 mg/dL. For medical purposes only. Performed at [facility], [place] [address], [place], [place] [address]   Urine rapid drug screen (hosp performed)     Status: Abnormal   Collection Time: 01/29/18  5:23 PM  Result Value Ref Range   Opiates (A) NONE DETECTED    Result not available. Reagent lot number recalled by manufacturer.   Cocaine NONE DETECTED NONE DETECTED   Benzodiazepines NONE DETECTED NONE DETECTED   Amphetamines NONE DETECTED NONE DETECTED  Tetrahydrocannabinol NONE DETECTED NONE DETECTED   Barbiturates NONE DETECTED NONE DETECTED    Comment: Performed at [facility] [address], [place], [place] [address]  CBC with Diff     Status: Abnormal   Collection Time: 01/29/18  5:23 PM  Result Value Ref Range   WBC 4.0 4.0 - 10.5 K/uL   RBC 4.50 3.87 - 5.11 MIL/uL   Hemoglobin 15.5 (H) 12.0 - 15.0 g/dL   HCT 45.3 36.0 - 46.0 %   MCV 100.7 (H) 78.0 - 100.0 fL   MCH 34.4 (H) 26.0 - 34.0 pg   MCHC 34.2 30.0 - 36.0 g/dL   RDW 13.7 11.5 - 15.5 %   Platelets 208 150 - 400 K/uL   Neutrophils Relative % 42 %   Neutro Abs 1.7 1.7 - 7.7 K/uL   Lymphocytes Relative 53 %   Lymphs Abs 2.1 0.7 - 4.0 K/uL   Monocytes Relative 4 %   Monocytes Absolute 0.2 0.1 - 1.0 K/uL   Eosinophils Relative 0 %   Eosinophils Absolute 0.0 0.0 - 0.7 K/uL   Basophils Relative 1 %   Basophils Absolute 0.0 0.0 - 0.1 K/uL    Comment: Performed at [facility], [place] [address], [place], [place] [address]  Acetaminophen level     Status: Abnormal   Collection Time: 01/29/18  5:23 PM  Result Value Ref Range   Acetaminophen (Tylenol), Serum <10 (L) 10 - 30 ug/mL    Comment: (NOTE) Therapeutic concentrations vary significantly. A range of 10-30 ug/mL  may be an effective  concentration for many patients. However, some  are best treated at concentrations outside of this range. Acetaminophen concentrations >150 ug/mL at 4 hours after ingestion  and >50 ug/mL at 12 hours after ingestion are often associated with  toxic reactions. Performed at [facility], [place] [address], [place], [place] [address]   Salicylate level     Status: None   Collection Time: 01/29/18  5:23 PM  Result Value Ref Range   Salicylate Lvl <0.3 2.8 - 30.0 mg/dL    Comment: Performed at [facility], [place] [address] [place], [place] [address]  Urinalysis, Routine w reflex microscopic     Status: None   Collection Time: 01/30/18 12:04 PM  Result Value Ref Range   Color, Urine YELLOW YELLOW   APPearance CLEAR CLEAR   Specific Gravity, Urine 1.014 1.005 - 1.030   pH 7.0 5.0 - 8.0   Glucose, UA NEGATIVE NEGATIVE mg/dL   Hgb urine dipstick NEGATIVE NEGATIVE   Bilirubin Urine NEGATIVE NEGATIVE   Ketones, ur NEGATIVE NEGATIVE mg/dL   Protein, ur NEGATIVE NEGATIVE mg/dL   Nitrite NEGATIVE NEGATIVE   Leukocytes, UA NEGATIVE NEGATIVE    Comment: Performed at [facility] [address] [place], [place] [address]  Pregnancy, urine     Status: None   Collection Time: 01/30/18 12:04 PM  Result Value Ref Range   Preg Test, Ur NEGATIVE NEGATIVE    Comment:        THE SENSITIVITY OF THIS METHODOLOGY IS >20 mIU/mL. Performed at [facility], [place] [address] [place], [place] [address]     Current Facility-Administered Medications  Medication Dose Route Frequency Provider Last Rate Last Dose  . acetaminophen (TYLENOL) tablet 650 mg  650 mg Oral Q4H PRN Deno Etienne, DO      . alum & mag hydroxide-simeth (MAALOX/MYLANTA) 200-200-20 MG/5ML suspension 30 mL  30  mL Oral Q6H PRN Deno Etienne, DO      . levothyroxine (SYNTHROID, LEVOTHROID) tablet 75 mcg  75 mcg Oral QAC breakfast Deno Etienne, DO   75 mcg at 01/31/18 0841  .  LORazepam (ATIVAN) injection 0-4 mg  0-4 mg Intravenous Q6H Deno Etienne, DO       Or  . LORazepam (ATIVAN) tablet 0-4 mg  0-4 mg Oral Q6H Deno Etienne, DO      . [START ON 02/01/2018] LORazepam (ATIVAN) injection 0-4 mg  0-4 mg Intravenous Q12H Deno Etienne, DO       Or  . Derrill Memo ON 02/01/2018] LORazepam (ATIVAN) tablet 0-4 mg  0-4 mg Oral Q12H Deno Etienne, DO      . ondansetron [facility]) tablet 4 mg  4 mg Oral Q8H PRN Deno Etienne, DO      . thiamine (VITAMIN B-1) tablet 100 mg  100 mg Oral Daily Deno Etienne, DO   100 mg at 01/30/18 2122   Or  . thiamine (B-1) injection 100 mg  100 mg Intravenous Daily Deno Etienne, DO      . traZODone (DESYREL) tablet 100 mg  100 mg Oral QHS PRN Corena Pilgrim, MD       Current Outpatient Medications  Medication Sig Dispense Refill  . potassium chloride 20 MEQ TBCR Take 20 mEq by mouth daily. 30 tablet 0    Musculoskeletal: Strength & Muscle Tone: within normal limits Gait & Station: normal Patient leans: N/A  Psychiatric Specialty Exam: Physical Exam  Nursing note and vitals reviewed. Constitutional: She is oriented to person, place, and time. She appears well-developed and well-nourished.  HENT:  Head: Normocephalic and atraumatic.  Neck: Normal range of motion.  Respiratory: Effort normal.  Musculoskeletal: Normal range of motion.  Neurological: She is alert and oriented to person, place, and time.  Psychiatric: Her speech is normal and behavior is normal. Her mood appears anxious. Her affect is blunt. Cognition and memory are normal. She expresses impulsivity. She exhibits a depressed mood. She expresses suicidal ideation. She expresses suicidal plans.    Review of Systems  Psychiatric/Behavioral: Positive for depression, substance abuse and suicidal ideas. The patient is nervous/anxious.   All other systems reviewed and are negative.   Blood pressure 112/67, pulse 88, temperature 98.7 F (37.1 C), temperature source Oral, resp. rate 18, height 5' 10"  (1.778 m), weight 61.2 kg (135 lb), SpO2 98 %.Body mass index is 19.37 kg/m.  General Appearance: Disheveled  Eye Contact:  Fair  Speech:  Normal Rate  Volume:  Normal  Mood:  Anxious and Depressed  Affect:  Blunt  Thought Process:  Coherent and Descriptions of Associations: Intact  Orientation:  Full (Time, Place, and Person)  Thought Content:  Rumination  Suicidal Thoughts:  No  Homicidal Thoughts:  No  Memory:  Immediate;   Fair Recent;   Fair Remote;   Fair  Judgement:  Impaired  Insight:  Fair  Psychomotor Activity:  Decreased  Concentration:  Concentration: Fair and Attention Span: Fair  Recall:  [employer] of Knowledge:  Fair  Language:  Fair  Akathisia:  No  Handed:  Right  AIMS (if indicated):   N/A  Assets:  Leisure Time Physical Health Resilience Social Support  ADL's:  Intact  Cognition:  WNL  Sleep:   N/A     Treatment Plan Summary: Daily contact with patient to assess and evaluate symptoms and progress in treatment, Medication management and Plan major depressive disorder, recurrent, severe without psychosis:  -  Crisis stabilization -Medication management:  Started Ativan alcohol detox protocol along with Prozac 10 mg daily for depression and Trazodone 100 mg PRN sleep -Individual counseling  Disposition: Recommend psychiatric Inpatient admission when medically cleared.  Waylan Boga, NP 01/31/2018 9:57 AM   Patient seen face-to-face for psychiatric evaluation, chart reviewed and case discussed with the physician extender and developed treatment plan. Reviewed the information documented and agree with the treatment plan.  Buford Dresser, DO 01/31/18 5:27 PM

## 2018-01-31 NOTE — Progress Notes (Addendum)
D: Pt was at nurse's station upon initial approach.  Pt presents with depressed affect and mood.  Smiles with interaction.  Describes her day as "good" and denies having a goal tonight.  Pt and writer made goal for pt to "be safe and sleep well" tonight.  Pt denies SI/HI, denies hallucinations, denies pain, denies withdrawal symptoms.  Pt has been visible in milieu interacting with peers and staff appropriately.  Pt attended evening group.   A: Introduced self to pt.  Actively listened to pt and offered support and encouragement. Medication administered per order.  PRN medication administered for sleep.  Q15 minute safety checks maintained.  R: Pt is safe on the unit.  Pt is compliant with medications.  Pt verbally contracts for safety.  Will continue to monitor and assess.

## 2018-01-31 NOTE — Progress Notes (Signed)
Patient ID: Sheila Graves, female   DOB: 10-10-1980, 37 y.o.   MRN: [id number] Admission Note  Pt is a 37 y.o. Female [facility] ingesting an unknown amount of potassium while drinking alcohol. Pt's BAL was 526 upon admission. Pt states she drinks once a week, "about half a water bottle full of liquor". Pt states she lives with her parents and they are her only support system. Pt is unemployed, and states this is a big stressor in her life. 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 Pt states [facility] before and was clean for 3 months following. Pt expresses interest in going back for help. Pt denies any verbal/physical/sexual abuse either present or past. Pt states her [facility]CP [facility] Lend[facility]ks [facility] [place]. Pt complains of insomnia, loneliness, and worrying. Pt denies any si/hi/ah/vh at this time and verbally agrees to approach staff if these become apparent.  Consents signed, skin/belongings search completed and patient oriented to unit. Patient stable at this time. Patient given the opportunity to express concerns and ask questions. Patient given toiletries. Will continue to monitor.

## 2018-02-01 ENCOUNTER — Encounter (HOSPITAL_COMMUNITY): Payer: Self-pay | Admitting: Behavioral Health

## 2018-02-01 LAB — COMPREHENSIVE METABOLIC PANEL
ALT: 33 U/L (ref 0–44)
AST: 64 U/L — ABNORMAL HIGH (ref 15–41)
Albumin: 4.7 g/dL (ref 3.5–5.0)
Alkaline Phosphatase: 66 U/L (ref 38–126)
Anion gap: 9 (ref 5–15)
BILIRUBIN TOTAL: 1.5 mg/dL — AB (ref 0.3–1.2)
BUN: 7 mg/dL (ref 6–20)
CHLORIDE: 102 mmol/L (ref 98–111)
CO2: 30 mmol/L (ref 22–32)
Calcium: 10 mg/dL (ref 8.9–10.3)
Creatinine, Ser: 0.79 mg/dL (ref 0.44–1.00)
Glucose, Bld: 86 mg/dL (ref 70–99)
POTASSIUM: 3.4 mmol/L — AB (ref 3.5–5.1)
Sodium: 141 mmol/L (ref 135–145)
TOTAL PROTEIN: 7.6 g/dL (ref 6.5–8.1)

## 2018-02-01 LAB — TSH: TSH: 8.21 u[IU]/mL — AB (ref 0.350–4.500)

## 2018-02-01 LAB — T4, FREE: FREE T4: 0.61 ng/dL — AB (ref 0.82–1.77)

## 2018-02-01 NOTE — [facility] Group Notes (Signed)
[facility] 02/01/2018 1:15pm  Type of Therapy: Mental Health Association Presentation  Participation Level: Active  Participation Quality: Attentive  Affect: Appropriate  Cognitive: Oriented  Insight: Developing/Improving  Engagement in Therapy: Engaged  Modes of Intervention: Discussion, Education and Socialization  Summary of Progress/Problems: Mental Health Association (MHA) Speaker came to talk about his personal journey with mental health. The pt processed ways by which to relate to the speaker. MHA speaker provided handouts and educational information pertaining to groups and services offered by the [facility]. Pt was engaged in speaker's presentation and was receptive to resources provided.    Rona RavensHeather S Murna Backer, LCSW 02/01/2018 3:04 PM

## 2018-02-01 NOTE — Progress Notes (Signed)
D: Sheila Graves denied symptoms of withdrawal this a.m. She was calm/cooperative/pleasant and denied SI/HI/AVH. She did not appear in distress. Concern was expressed by offgoing shift that she may be minimizing her symptoms. She said she woke up at about 0330-0400 but said that is normal for her.   On her daily self-inventory sheet, she reported fair sleep, good appetite, normal energy level, and good concentration. She rated her depression level 0/10, hopelessness 0/10, and anxiety 1/10. She denied any physical complaints or concerns when asked.   A: Meds given as ordered. Q15 safety checks maintained. Support/encouragement offered.  R: Pt remains free from harm and continues with treatment. Will continue to monitor for needs/safety.

## 2018-02-01 NOTE — Progress Notes (Addendum)
[facility] MD Progress Note  02/01/2018 8:49 AM Sheila Graves  MRN:  [id number]   Subjective: " I am just ready to get out of here that's why I am so anxious."  Objective: Face to face evaluation completed, case discussed with treatment team and chart reviewed.  Patient is a 37 year old female with a long-standing past psychiatric history significant for alcohol dependence who [facility]/3 after what was thought to be an intentional overdose of potassium  During this evaluation patient is alert and oriented x4 and cooperative. She is observed to be very anxious and only notes that she is anxious because she is ready to be discharged. She minimizes the suspected overdose although confirm that she was heavily drinking alcohol on the day of the incident and she blacked out not recalling much. Her blood alcohol level was 526 and potassium was 4.8. She minimizes any depressive symptoms at this time as well as other psychiatric symptoms including auditory/visual hallucinations, paranoid thoughts, delusions and does not appear internally preoccupied. Denies SI or urges to self harm. She does acknowledged her history of alcohol abuse and reports she is open to  [facility] facility, perhaps [facility] or [facility] [place]. She has a history of delirium and seizure secondary to her alcohol abuse although  denies any withdrawal symptoms at this time. Endorses no concerns with appetite or resting pattern. Denies somatic complaints or acute pain. She is on no psychiatric medications at this time for depression or anxiety.         Principal Problem: <principal problem not specified> Diagnosis:   Patient Active Problem List   Diagnosis Date Noted  . Alcohol dependence with withdrawal ([place]) [F10.239] 01/31/2018  . Major depressive disorder, recurrent, severe without psychotic features ([place]) [F33.2] 01/31/2018   Total Time spent with patient: 20  minutes  Past Psychiatric History: Patient has multiple hospitalizations at [facility] for alcohol-related issues.  She stated that she has been in rehabilitation 3 or 4 times.  Her last alcohol rehabilitation stay was in 2018.  She stated she had been sober for approximately 60 to 70 days.    Past Medical History:  Past Medical History:  Diagnosis Date  . Alcohol withdrawal seizure ([place])   . ETOH abuse   . Hypokalemia   . Pancreatitis   . Thyroid disease     Past Surgical History:  Procedure Laterality Date  . FINGER SURGERY     Family History: History reviewed. No pertinent family history. Family Psychiatric  History: In her direct family negative, 1 of her grandparents was an alcoholic   Social History:  Social History   Substance and Sexual Activity  Alcohol Use Yes   Comment: states that she drinks a couple drinks 2-3 times weekly     Social History   Substance and Sexual Activity  Drug Use No    Social History   Socioeconomic History  . Marital status: Single    Spouse name: Not on file  . Number of children: Not on file  . Years of education: Not on file  . Highest education level: Not on file  Occupational History  . Not on file  Social Needs  . Financial resource strain: Not on file  . Food insecurity:    Worry: Not on file    Inability: Not on file  . Transportation needs:    Medical: Not on file    Non-medical: Not on file  Tobacco Use  . Smoking status: Never Smoker  . Smokeless tobacco: Never Used  Substance and Sexual Activity  . Alcohol use: Yes    Comment: states that she drinks a couple drinks 2-3 times weekly  . Drug use: No  . Sexual activity: Not Currently    Birth control/protection: None  Lifestyle  . Physical activity:    Days per week: Not on file    Minutes per session: Not on file  . Stress: Not on file  Relationships  . Social connections:    Talks on phone: Not on file    Gets together: Not on file    Attends  religious service: Not on file    Active member of club or organization: Not on file    Attends meetings of clubs or organizations: Not on file    Relationship status: Not on file  Other Topics Concern  . Not on file  Social History Narrative  . Not on file   Additional Social History:          Sleep: Good  Appetite:  Good  Current Medications: Current Facility-Administered Medications  Medication Dose Route Frequency Provider Last Rate Last Dose  . acetaminophen (TYLENOL) tablet 650 mg  650 mg Oral Q6H PRN Sharma Covert, MD      . alum & mag hydroxide-simeth (MAALOX/MYLANTA) 200-200-20 MG/5ML suspension 30 mL  30 mL Oral Q4H PRN Sharma Covert, MD      . hydrOXYzine (ATARAX/VISTARIL) tablet 25 mg  25 mg Oral Q6H PRN Sharma Covert, MD      . loperamide (IMODIUM) capsule 2-4 mg  2-4 mg Oral PRN Sharma Covert, MD      . LORazepam (ATIVAN) tablet 1 mg  1 mg Oral Q6H PRN Sharma Covert, MD      . LORazepam (ATIVAN) tablet 1 mg  1 mg Oral QID Sharma Covert, MD   Stopped at 02/01/18 0740   Followed by  . LORazepam (ATIVAN) tablet 1 mg  1 mg Oral TID Sharma Covert, MD       Followed by  . [START ON 02/02/2018] LORazepam (ATIVAN) tablet 1 mg  1 mg Oral BID Sharma Covert, MD       Followed by  . [START ON 02/04/2018] LORazepam (ATIVAN) tablet 1 mg  1 mg Oral Daily Clary, Cordie Grice, MD      . magnesium hydroxide (MILK OF MAGNESIA) suspension 30 mL  30 mL Oral Daily PRN Sharma Covert, MD      . multivitamin with minerals tablet 1 tablet  1 tablet Oral Daily Sharma Covert, MD   1 tablet at 02/01/18 [phone]  . ondansetron (ZOFRAN-ODT) disintegrating tablet 4 mg  4 mg Oral Q6H PRN Sharma Covert, MD      . potassium chloride SA (K-DUR,KLOR-CON) CR tablet 20 mEq  20 mEq Oral Daily Sharma Covert, MD   20 mEq at 02/01/18 [phone]  . thiamine (B-1) injection 100 mg  100 mg Intramuscular Once Sharma Covert, MD      . thiamine (VITAMIN B-1)  tablet 100 mg  100 mg Oral Daily Sharma Covert, MD   100 mg at 02/01/18 8921  . traZODone (DESYREL) tablet 50 mg  50 mg Oral QHS PRN Sharma Covert, MD   50 mg at 01/31/18 2112    Lab Results:  Results for orders placed or performed during the hospital encounter of 01/31/18 (from the past 48  hour(s))  Comprehensive metabolic panel     Status: Abnormal   Collection Time: 02/01/18  6:35 AM  Result Value Ref Range   Sodium 141 135 - 145 mmol/L   Potassium 3.4 (L) 3.5 - 5.1 mmol/L   Chloride 102 98 - 111 mmol/L   CO2 30 22 - 32 mmol/L   Glucose, Bld 86 70 - 99 mg/dL   BUN 7 6 - 20 mg/dL   Creatinine, Ser 0.79 0.44 - 1.00 mg/dL   Calcium 10.0 8.9 - 10.3 mg/dL   Total Protein 7.6 6.5 - 8.1 g/dL   Albumin 4.7 3.5 - 5.0 g/dL   AST 64 (H) 15 - 41 U/L   ALT 33 0 - 44 U/L   Alkaline Phosphatase 66 38 - 126 U/L   Total Bilirubin 1.5 (H) 0.3 - 1.2 mg/dL   GFR calc non Af Amer >60 >60 mL/min   GFR calc Af Amer >60 >60 mL/min    Comment: (NOTE) The eGFR has been calculated using the CKD EPI equation. This calculation has not been validated in all clinical situations. eGFR's persistently <60 mL/min signify possible Chronic Kidney Disease.    Anion gap 9 5 - 15    Comment: Performed at [facility], [place] [address], [place], [place] [address]  TSH     Status: Abnormal   Collection Time: 02/01/18  6:35 AM  Result Value Ref Range   TSH 8.210 (H) 0.350 - 4.500 uIU/mL    Comment: Performed by a 3rd Generation assay with a functional sensitivity of <=0.01 uIU/mL. Performed at [facility], [place] [address], [place], [place] [address]     Blood Alcohol level:  Lab Results  Component Value Date   ETH [facility]) 80/32/1224    Metabolic Disorder Labs: No results found for: HGBA1C, MPG No results found for: PROLACTIN No results found for: CHOL, TRIG, HDL, CHOLHDL, VLDL, LDLCALC  Physical Findings: AIMS: Facial and Oral Movements Muscles of Facial  Expression: None, normal Lips and Perioral Area: None, normal Jaw: None, normal Tongue: None, normal,Extremity Movements Upper (arms, wrists, hands, fingers): None, normal Lower (legs, knees, ankles, toes): None, normal, Trunk Movements Neck, shoulders, hips: None, normal, Overall Severity Severity of abnormal movements (highest score from questions above): None, normal Incapacitation due to abnormal movements: None, normal Patient's awareness of abnormal movements (rate only patient's report): No Awareness, Dental Status Current problems with teeth and/or dentures?: No Does patient usually wear dentures?: No  CIWA:  CIWA-Ar Total: 2 COWS:     Musculoskeletal: Strength & Muscle Tone: within normal limits Gait & Station: normal Patient leans: N/A  Psychiatric Specialty Exam: Physical Exam  Nursing note and vitals reviewed. Constitutional: She is oriented to person, place, and time.  Neurological: She is alert and oriented to person, place, and time.    Review of Systems  Psychiatric/Behavioral: Positive for substance abuse. Negative for depression, hallucinations, memory loss and suicidal ideas. The patient is not nervous/anxious and does not have insomnia.   All other systems reviewed and are negative.   Blood pressure 110/86, pulse 87, temperature 98.2 F (36.8 C), temperature source Oral, resp. rate 16, height '5\' 10"'$  (1.778 m), weight 61.2 kg (135 lb), SpO2 100 %.Body mass index is 19.37 kg/m.  General Appearance: Casual  Eye Contact:  Good  Speech:  Clear and Coherent and Normal Rate  Volume:  Normal  Mood:  Anxious  Affect:  Congruent  Thought Process:  Coherent, Goal Directed, Linear and Descriptions  of Associations: Intact  Orientation:  Full (Time, Place, and Person)  Thought Content:  Logical  Suicidal Thoughts:  No  Homicidal Thoughts:  No  Memory:  Immediate;   Fair Recent;   Fair  Judgement:  Impaired  Insight:  Lacking and Shallow  Psychomotor Activity:   Normal  Concentration:  Concentration: Fair and Attention Span: Fair  Recall:  [employer] of Knowledge:  Fair  Language:  Good  Akathisia:  Negative  Handed:  Right  AIMS (if indicated):     Assets:  Communication Skills Desire for Improvement Resilience Social Support  ADL's:  Intact  Cognition:  WNL  Sleep:  Number of Hours: 4.5     Treatment Plan Summary: Daily contact with patient to assess and evaluate symptoms and progress in treatment   Patient is a 37 year old female with the above-stated past psychiatric history who was admitted after transfer from the [facility] emergency department for alcohol dependence, alcohol withdrawal.  There was also concern for suicide attempt by overdose of potassium.  Her potassium was 4.8 which is the normal limit.  Her blood alcohol was 561. As of today, patient continues to minimize. She denies depression, SI or AVH. She is very focused on discharged.    Medication management: She I son no medication for depression or anxiety. She will remain on detox protocol  as well as seizure precautions due to her history of delirium and seizures secondary to her alcohol abuse.  Continued  B12, folate and a multivitamin. Continue Trazodone 50 mg po daily at bedtime as needed for sleep.   Other:  Safety: Will continue 15 minute observation for safety checks. Patient is able to contract for safety on the unit at this time  Labs: blood alcohol level was 526 and potassium was 4.8. Pregnancy negative. TSH 8.210. Will reorder and add Free T4 and Free T3. Potassium I snow 3.4, ALT 64 and total bilirubin 1.5 otherwise CMP normal.  UDS negative.   Continue to develop treatment plan to decrease risk of relapse upon discharge and to reduce the need for readmission.  Psycho-social education regarding relapse prevention and self care.  Health care follow up as needed for medical problems.  Continue to attend and participate in therapy.     Mordecai Maes,  NP 02/01/2018, 8:49 AM   ..Agree with NP Progress Note

## 2018-02-01 NOTE — Progress Notes (Signed)
D  Pt is appropriate and pleasant on approach   She is cooperative and interacts well with others    She does endorse some sadness and depression and her affect is congruent with same  A   Verbal support given   Medications administered and monitor for effectiveness   Monitor withdrawal and educate on same  Q 15 min checks R   Pt is safe at present time

## 2018-02-01 NOTE — [facility] Suicide Risk Assessment (Signed)
[facility] INPATIENT:  Family/Significant Other Suicide Prevention Education  Suicide Prevention Education:  Education Completed; Ardelle BallsRicky Reposa (pt's father) [phone] has been identified by the patient as the family member/significant other with whom the patient will be residing, and identified as the person(s) who will aid the patient in the event of a mental health crisis (suicidal ideations/suicide attempt).  With written consent from the patient, the family member/significant other has been provided the following suicide prevention education, prior to the and/or following the discharge of the patient.  The suicide prevention education provided includes the following:  Suicide risk factors  Suicide prevention and interventions  National Suicide Hotline telephone number  [facility] assessment telephone number  [facility] Emergency Assistance 911  [facility] and/or Residential Mobile Crisis Unit telephone number  Request made of family/significant other to:  Remove weapons (e.g., guns, rifles, knives), all items previously/currently identified as safety concern.    Remove drugs/medications (over-the-counter, prescriptions, illicit drugs), all items previously/currently identified as a safety concern.  The family member/significant other verbalizes understanding of the suicide prevention education information provided.  The family member/significant other agrees to remove the items of safety concern listed above.  Pt's father has no concerns regarding pt returning home "whenever you all think she is ready." Pt's father shared that pt needs to get an email off her phone regarding a job interview that they are scheduling for this week. "She really needs to get this job. She does so much better when she is working and staying busy." Pt's father verified that pt does not have access to weapons/firearms and is able to return to his home at discharge. SPE and aftercare appt  reviewed with pt's father as well.   Rona RavensHeather S Ordean Fouts LCSW 02/01/2018, 1:59 PM

## 2018-02-01 NOTE — [facility] Counselor (Signed)
Adult Comprehensive Assessment  Patient ID: Sheila Graves, female   DOB: Jan 05, 1981, 37 y.o.   MRN: [id number]  Information Source: Information source: Patient  Current Stressors:  Employment / Job issues: unemployed Physical health (include injuries & life threatening diseases): none identified.  Substance abuse: alcohol Bereavement / Loss: none identified   Living/Environment/Situation:  Living Arrangements: Parent Living conditions (as described by patient or guardian): [place] Who else lives in the home?: both parents How long has patient lived in current situation?: 2 years What is atmosphere in current home: Comfortable, Paramedic, Supportive  Family History:  Marital status: Single Are you sexually active?: No Has your sexual activity been affected by drugs, alcohol, medication, or emotional stress?: n/a Does patient have children?: No  Childhood History:  By whom was/is the patient raised?: Both parents Additional childhood history information: good childhood. neither parent had any substance use or mental health issues. Description of patient's relationship with caregiver when they were a child: close to parents Patient's description of current relationship with people who raised him/her: strained at times. "they were hoping I could come home soon." How were you disciplined when you got in trouble as a child/adolescent?: grounded or take away priveledges Does patient have siblings?: Yes Number of Siblings: 1 Description of patient's current relationship with siblings: one older sister. no substance use or mental health issues.  Did patient suffer any verbal/emotional/physical/sexual abuse as a child?: No Did patient suffer from severe childhood neglect?: No Has patient ever been sexually abused/assaulted/raped as an adolescent or adult?: No Was the patient ever a victim of a crime or a disaster?: No Witnessed domestic violence?: No Has patient been effected by  domestic violence as an adult?: No  Education:  Highest grade of school patient has completed: Occupational psychologist Currently a Consulting civil engineer?: No Learning disability?: No  Employment/Work Situation:   Employment situation: Unemployed Patient's job has been impacted by current illness: No Describe how patient's job has been impacted: n/a What is the longest time patient has a held a job?: 11 months--"the travel got to be too much."  Where was the patient employed at that time?: [employer] Did You Receive Any Psychiatric Treatment/Services While in Equities trader?: (no Financial planner) Are There Guns or Education officer, community in Your Home?: No Are These Comptroller?: (n/a .)  Financial Resources:   Financial resources: No income, Support from parents / caregiver Does patient have a Lawyer or guardian?: No  Alcohol/Substance Abuse:   What has been your use of drugs/alcohol within the last 12 months?: "I drink alittle too much--binge." "I drink once or twice a week--usually vodka." "I had more to drink than normal and it hit me too quickly." no drug use.  If attempted suicide, did drugs/alcohol play a role in this?: No Alcohol/Substance Abuse Treatment Hx: Past Tx, Inpatient, Past detox If yes, describe treatment: [facility]-detox--"2018."  Has alcohol/substance abuse ever caused legal problems?: No  Social Support System:   Patient's Community Support System: Fair Describe Community Support System: "My friends have gotten married and had kids. I spend most of my time with parents." Type of faith/religion: methodist How does patient's faith help to cope with current illness?: "I talk to my pastor about twice a week." "I also have an AA sponsor."  Leisure/Recreation:   Leisure and Hobbies: play basketball  Strengths/Needs:   What is the patient's perception of their strengths?: intelligent; like to work out Patient states they can use these personal  strengths during their treatment to contribute to their recovery: motivated to stop drinking and attend AA Patient states these barriers may affect/interfere with their treatment: "nothing. I'm ready to discharge." Patient states these barriers may affect their return to the community: none identified Other important information patient would like considered in planning for their treatment: none  Discharge Plan:   Currently receiving community mental health services: No Patient states concerns and preferences for aftercare planning are: none  Patient states they will know when they are safe and ready for discharge when: "I'm ready to discharge now."  Does patient have access to transportation?: Yes(mother) Does patient have financial barriers related to discharge medications?: Yes Patient description of barriers related to discharge medications: no insurance but parents assist financially.  Will patient be returning to same living situation after discharge?: Yes  Summary/Recommendations:   Summary and Recommendations (to be completed by the evaluator): Patient is 37yo [place], [place] her parents. She presents involuntarily to the hospital due to intoxication, ingesting unknown amount of pottassium, and for medication stabilization. Pt has a diagnosis of Alcohol Use Disorder severe. She denies SI/HI/AVH. Pt is requesting discharge. Patient is single, unemployed currently, with no children. Recommendations for pt include: crisis stabilization, therapeutic milieu, encourage group attendance and participation, medication management for mood stabilization/detox, and development of comprehensive mental wellness/sobriety plan. She is interested in returning home and [facility] [place] [place] and resuming AA. Pt states that she has an AA sponsor and also sees her pastor twice weekly. Recommendations for pt include: crisis stabilization, therapeutic milieu, encourage group  attendance and participation, medication management for detox/mood stabilization, and development of comrpehensive mental wellness/sobriety plan.    Rona RavensHeather S Lorianne Malbrough LCSW 02/01/2018 9:43 AM

## 2018-02-02 ENCOUNTER — Encounter (HOSPITAL_COMMUNITY): Payer: Self-pay | Admitting: Behavioral Health

## 2018-02-02 DIAGNOSIS — F332 Major depressive disorder, recurrent severe without psychotic features: Secondary | ICD-10-CM

## 2018-02-02 LAB — LIPID PANEL
CHOL/HDL RATIO: 2.1 ratio
Cholesterol: 230 mg/dL — ABNORMAL HIGH (ref 0–200)
HDL: 110 mg/dL (ref >40)
LDL CALC: 92 mg/dL (ref 0–99)
Triglycerides: 142 mg/dL (ref <150)
VLDL: 28 mg/dL (ref 0–40)

## 2018-02-02 LAB — T3, FREE: T3, Free: 3.2 pg/mL (ref 2.0–4.4)

## 2018-02-02 LAB — TSH: TSH: 11.072 u[IU]/mL — AB (ref 0.350–4.500)

## 2018-02-02 MED ORDER — HYDROXYZINE HCL 25 MG PO TABS: 25.0000 mg | ORAL_TABLET | Freq: Four times a day (QID) | ORAL | 0 refills | Status: DC | PRN

## 2018-02-02 MED ORDER — ACAMPROSATE CALCIUM 333 MG PO TBEC: 666.0000 mg | DELAYED_RELEASE_TABLET | Freq: Three times a day (TID) | ORAL | 0 refills | Status: DC

## 2018-02-02 MED ORDER — THIAMINE HCL 100 MG PO TABS: 100.0000 mg | ORAL_TABLET | Freq: Every day | ORAL | 0 refills | Status: DC

## 2018-02-02 MED ORDER — ACAMPROSATE CALCIUM 333 MG PO TBEC
666.0000 mg | DELAYED_RELEASE_TABLET | Freq: Three times a day (TID) | ORAL | Status: DC
  Administered 2018-02-02: 666 mg via ORAL
  Filled 2018-02-02: qty 2
  Filled 2018-02-02 (×2): qty 42
  Filled 2018-02-02 (×2): qty 2
  Filled 2018-02-02: qty 42

## 2018-02-02 MED ORDER — TRAZODONE HCL 50 MG PO TABS: 50.0000 mg | ORAL_TABLET | Freq: Every evening | ORAL | 0 refills | Status: DC | PRN

## 2018-02-02 NOTE — Progress Notes (Signed)
Patient ID: Sheila Graves, female   DOB: 04-26-1981, 37 y.o.   MRN: [id number]  Nursing Progress Note [id number]  Data: Patient presents calm, pleasant and cooperative. Patient complaint with scheduled medications. Patient denies pain/physical complaints or withdrawal symptoms and declined need for scheduled Ativan. Patient completed self-inventory sheet and rates depression, hopelessness, and anxiety 0,0,0 respectively. Patient rates their sleep and appetite as good/good respectively. Patient states goal for today is to "be positive" and "think about the good things in my life". Patient is seen visible in the milieu. Patient currently denies SI/HI/AVH. Patient demonstrates fair insight when discussing her issues with alcohol and her plans to attend AA once discharged.  Action: Patient educated about and provided medication per provider's orders. Patient safety maintained with q15 min safety checks and frequent rounding. Low fall risk precautions in place. Emotional support given. 1:1 interaction and active listening provided. Patient encouraged to attend meals and groups. Patient encouraged to work on treatment plan and goals. Labs, vital signs and patient behavior monitored throughout shift.   Response: Patient agrees to come to staff if any thoughts of SI/HI develop or if patient develops intention of acting on thoughts. Patient remains safe on the unit at this time. Patient is interacting with peers appropriately on the unit. Will continue to support and monitor.

## 2018-02-02 NOTE — Progress Notes (Signed)
  [facility] Discharge Plan :  Will you be returning to the same living situation after discharge:  Yes,  patient reports she is returning home with her parents At discharge, do you have transportation home?: Yes,  patient [facility] Do you have the ability to pay for your medications: No.  Release of information consent forms completed and in the chart;  Patient's signature needed at discharge.  Patient to Follow up at: Follow-up Information    [facility]  Follow up on 02/04/2018.   Why:  Assessment for outpatient services with Ellison CarwinFrances Gill on Friday, 8/9 at 8:30AM. Please bring: photo ID and hospital discharge paperwork to this appt. Thank you.  Contact information: [address] [place] [id number] [phone]        [facility] Follow up on 02/22/2018.   Why:  Screening for possible admission on Tuesday, 8/27 at 7:45AM. Please bring photo ID/proof of [place], 30 day supply of medications, and clothing (no leggings or yoga pants). Thank you.  Contact information: [facility] [place] [address] [phone]           Next level of care provider has [facility] to [facility] Link:yes  Safety Planning and Suicide Prevention discussed: Yes,  with the patient's father     Has patient been referred to the Quitline?: Patient refused referral  Patient has been referred for addiction treatment: Pt. refused referral  Maeola SarahJolan E Genevieve Arbaugh, LCSWA 02/02/2018, 10:01 AM

## 2018-02-02 NOTE — Tx Team (Signed)
Interdisciplinary Treatment and Diagnostic Plan Update  02/02/2018 Time of Session: 10:20am Sheila Graves MRN: [id number]  Principal Diagnosis: Major depressive disorder, recurrent, severe without psychotic features (HCC)  Secondary Diagnoses: Principal Problem:   Major depressive disorder, recurrent, severe without psychotic features (HCC) Active Problems:   Alcohol dependence with withdrawal (HCC)   Current Medications:  Current Facility-Administered Medications  Medication Dose Route Frequency Provider Last Rate Last Dose  . acamprosate (CAMPRAL) tablet 666 mg  666 mg Oral TID WC Cobos, Rockey Situ, MD      . acetaminophen (TYLENOL) tablet 650 mg  650 mg Oral Q6H PRN Antonieta Pert, MD      . alum & mag hydroxide-simeth (MAALOX/MYLANTA) 200-200-20 MG/5ML suspension 30 mL  30 mL Oral Q4H PRN Antonieta Pert, MD      . hydrOXYzine (ATARAX/VISTARIL) tablet 25 mg  25 mg Oral Q6H PRN Antonieta Pert, MD   25 mg at 02/01/18 2114  . loperamide (IMODIUM) capsule 2-4 mg  2-4 mg Oral PRN Antonieta Pert, MD      . LORazepam (ATIVAN) tablet 1 mg  1 mg Oral Q6H PRN Antonieta Pert, MD      . LORazepam (ATIVAN) tablet 1 mg  1 mg Oral TID Antonieta Pert, MD   1 mg at 02/01/18 1656   Followed by  . LORazepam (ATIVAN) tablet 1 mg  1 mg Oral BID Antonieta Pert, MD       Followed by  . [START ON 02/04/2018] LORazepam (ATIVAN) tablet 1 mg  1 mg Oral Daily Clary, Marlane Mingle, MD      . magnesium hydroxide (MILK OF MAGNESIA) suspension 30 mL  30 mL Oral Daily PRN Antonieta Pert, MD      . multivitamin with minerals tablet 1 tablet  1 tablet Oral Daily Antonieta Pert, MD   1 tablet at 02/02/18 0815  . ondansetron (ZOFRAN-ODT) disintegrating tablet 4 mg  4 mg Oral Q6H PRN Antonieta Pert, MD      . potassium chloride SA (K-DUR,KLOR-CON) CR tablet 20 mEq  20 mEq Oral Daily Antonieta Pert, MD   20 mEq at 02/02/18 0815  . thiamine (B-1) injection 100 mg  100 mg Intramuscular  Once Antonieta Pert, MD      . thiamine (VITAMIN B-1) tablet 100 mg  100 mg Oral Daily Antonieta Pert, MD   100 mg at 02/02/18 0815  . traZODone (DESYREL) tablet 50 mg  50 mg Oral QHS PRN Antonieta Pert, MD   50 mg at 02/01/18 2113   PTA Medications: Medications Prior to Admission  Medication Sig Dispense Refill Last Dose  . potassium chloride 20 MEQ TBCR Take 20 mEq by mouth daily. 30 tablet 0 01/31/2018 at Unknown time    Patient Stressors: Financial difficulties Occupational concerns Substance abuse  Patient Strengths: Ability for insight Active sense of humor Average or above average intelligence Motivation for treatment/growth  Treatment Modalities: Medication Management, Group therapy, Case management,  1 to 1 session with clinician, Psychoeducation, Recreational therapy.   Physician Treatment Plan for Primary Diagnosis: Major depressive disorder, recurrent, severe without psychotic features (HCC) Long Term Goal(s): Improvement in symptoms so as ready for discharge Improvement in symptoms so as ready for discharge   Short Term Goals: Ability to identify changes in lifestyle to reduce recurrence of condition will improve Ability to verbalize feelings will improve Ability to disclose and discuss suicidal ideas Ability to demonstrate self-control will improve Ability  to identify and develop effective coping behaviors will improve Ability to maintain clinical measurements within normal limits will improve Ability to identify triggers associated with substance abuse/mental health issues will improve Ability to identify changes in lifestyle to reduce recurrence of condition will improve Ability to verbalize feelings will improve Ability to disclose and discuss suicidal ideas Ability to demonstrate self-control will improve Ability to identify and develop effective coping behaviors will improve Ability to maintain clinical measurements within normal limits will  improve Ability to identify triggers associated with substance abuse/mental health issues will improve  Medication Management: Evaluate patient's response, side effects, and tolerance of medication regimen.  Therapeutic Interventions: 1 to 1 sessions, Unit Group sessions and Medication administration.  Evaluation of Outcomes: Adequate for Discharge  Physician Treatment Plan for Secondary Diagnosis: Principal Problem:   Major depressive disorder, recurrent, severe without psychotic features (HCC) Active Problems:   Alcohol dependence with withdrawal (HCC)  Long Term Goal(s): Improvement in symptoms so as ready for discharge Improvement in symptoms so as ready for discharge   Short Term Goals: Ability to identify changes in lifestyle to reduce recurrence of condition will improve Ability to verbalize feelings will improve Ability to disclose and discuss suicidal ideas Ability to demonstrate self-control will improve Ability to identify and develop effective coping behaviors will improve Ability to maintain clinical measurements within normal limits will improve Ability to identify triggers associated with substance abuse/mental health issues will improve Ability to identify changes in lifestyle to reduce recurrence of condition will improve Ability to verbalize feelings will improve Ability to disclose and discuss suicidal ideas Ability to demonstrate self-control will improve Ability to identify and develop effective coping behaviors will improve Ability to maintain clinical measurements within normal limits will improve Ability to identify triggers associated with substance abuse/mental health issues will improve     Medication Management: Evaluate patient's response, side effects, and tolerance of medication regimen.  Therapeutic Interventions: 1 to 1 sessions, Unit Group sessions and Medication administration.  Evaluation of Outcomes: Adequate for Discharge   RN Treatment  Plan for Primary Diagnosis: Major depressive disorder, recurrent, severe without psychotic features (HCC) Long Term Goal(s): Knowledge of disease and therapeutic regimen to maintain health will improve  Short Term Goals: Ability to participate in decision making will improve, Ability to identify and develop effective coping behaviors will improve and Compliance with prescribed medications will improve  Medication Management: RN will administer medications as ordered by provider, will assess and evaluate patient's response and provide education to patient for prescribed medication. RN will report any adverse and/or side effects to prescribing provider.  Therapeutic Interventions: 1 on 1 counseling sessions, Psychoeducation, Medication administration, Evaluate responses to treatment, Monitor vital signs and CBGs as ordered, Perform/monitor CIWA, COWS, AIMS and Fall Risk screenings as ordered, Perform wound care treatments as ordered.  Evaluation of Outcomes: Adequate for Discharge   LCSW Treatment Plan for Primary Diagnosis: Major depressive disorder, recurrent, severe without psychotic features (HCC) Long Term Goal(s): Safe transition to appropriate next level of care at discharge, Engage patient in therapeutic group addressing interpersonal concerns.  Short Term Goals: Engage patient in aftercare planning with referrals and resources  Therapeutic Interventions: Assess for all discharge needs, 1 to 1 time with Social worker, Explore available resources and support systems, Assess for adequacy in community support network, Educate family and significant other(s) on suicide prevention, Complete Psychosocial Assessment, Interpersonal group therapy.  Evaluation of Outcomes: Adequate for Discharge   Progress in Treatment: Attending groups: Yes. Participating in groups:  Yes. Taking medication as prescribed: Yes. Toleration medication: Yes. Family/Significant other contact made: Yes, individual(s)  contacted:  the patient's father Patient understands diagnosis: Yes. Discussing patient identified problems/goals with staff: Yes. Medical problems stabilized or resolved: Yes. Denies suicidal/homicidal ideation: Yes. Issues/concerns per patient self-inventory: No. Other:   New problem(s) identified: None  New Short Term/Long Term Goal(s): Detox, medication stabilization, elimination of SI thoughts, development of comprehensive mental wellness plan.   Patient Goals:  Abstain from alcohol and become clean. Also work on Pharmacologist for when I am sad.   Discharge Plan or Barriers: Patient discharged home with her parents in [place], [place]. Patient has follow up with [facility] for outpatient medication management and therapy services. Patient also has an admission screening at [facility] for possible residential substance abuse treatment on 02/22/18.   Reason for Continuation of Hospitalization: None   Estimated Length of Stay: Discharge, Wednesday 02/02/18  Attendees: Patient: 02/02/2018 10:41 AM  Physician: Dr. Nehemiah Massed, MD 02/02/2018 10:41 AM  Nursing: Chancy Hurter, RN  02/02/2018 10:41 AM  RN Care Manager: Juliann Pares 02/02/2018 10:41 AM  Social Worker: Baldo Daub, LCSWA 02/02/2018 10:41 AM  Recreational Therapist: X 02/02/2018 10:41 AM  Other: X 02/02/2018 10:41 AM  Other: X 02/02/2018 10:41 AM  Other:X 02/02/2018 10:41 AM    Scribe for Treatment Team: Maeola Sarah, LCSWA 02/02/2018 10:41 AM

## 2018-02-02 NOTE — Therapy (Signed)
Occupational Therapy Group Note  Date:  02/02/2018 Time:  11:59 AM  Group Topic/Focus:  Stress Management  Participation Level:  Active  Participation Quality:  Appropriate  Affect:  Blunted  Cognitive:  Appropriate  Insight: Improving  Engagement in Group:  Engaged  Modes of Intervention:  Activity, Discussion, Education and Socialization  Additional Comments:    S: "I tried deep breathing once, but I am willing to keep practicing these strategies"  O: Stress management group completed to use as productive coping strategy, to help mitigate maladaptive coping to integrate in functional BADL/IADL when reintegrating into community. Education given on the definition of stress and its cognitive, behavioral, emotional, and physical effects on the body. Stress management tools worksheet completed to identify negative coping mechanisms and their short and long term effects vs positive coping mechanisms with demonstration. Coping strategies taught include: relaxation based- deep breathing, counting to 10, taking a 1 minute vacation, acceptance, stress balls, relaxation audio/video, visual/mental imagery. Positive mental attitude- gratitude, acceptance, cognitive reframing, positive self talk, anger management. Self control circle activity completed to identify areas of control and areas not within personal control to facilitate acceptance in daily relationships and life.  Gratitude journaling handout and instruction also given. Adult coloring and relaxation tips worksheet given at end of session.   A: Pt presents to group with flat affect, engaged and participatory in group this date. Pt identified that majority of her stress management this date is maladaptive (drugs, etc.), but states she is aware and willing to change. Stress management tools worksheet completed, pt stating she would like to try scheduling relaxing activities. She shares she particularly likes basketball, and would like to use  that as exercise and a preferred activity this date.    P: Pt provided with education on stress management activities to implement into daily routine. Handouts given to facilitate carryover when reintegrating into community       Thomas H Boyd Memorial HospitalKaylee Reynalda Canny, [place] YorkMSOT, OTR/L  AvnetKaylee Stefani Baik 02/02/2018, 11:59 AM

## 2018-02-02 NOTE — Discharge Summary (Addendum)
Physician Discharge Summary Note  Patient:  Sheila Graves is an 37 y.o., female MRN:  [id number] DOB:  08/02/1980 Patient phone:  [phone] (home)  Patient address:   [address] [place] [address],  Total Time spent with patient: 30 minutes  Date of Admission:  01/31/2018 Date of Discharge: 02/02/2018  Reason for Admission:  Suspected overdose.   Principal Problem: Major depressive disorder, recurrent, severe without psychotic features [facility]) Discharge Diagnoses: Patient Active Problem List   Diagnosis Date Noted  . Alcohol dependence with withdrawal (HCC) [F10.239] 01/31/2018    Priority: High  . Major depressive disorder, recurrent, severe without psychotic features (HCC) [F33.2] 01/31/2018    Priority: High    Past Psychiatric History: Patient has multiple hospitalizations at [facility] regional for alcohol-related issues.  She stated that she has been in rehabilitation 3 or 4 times.  Her last alcohol rehabilitation stay was in 2018.  She stated she had been sober for approximately 60 to 70 days    Past Medical History:  Past Medical History:  Diagnosis Date  . Alcohol withdrawal seizure (HCC)   . ETOH abuse   . Hypokalemia   . Pancreatitis   . Thyroid disease     Past Surgical History:  Procedure Laterality Date  . FINGER SURGERY     Family History: History reviewed. No pertinent family history. Family Psychiatric  History: In her direct family negative, 1 of her grandparents was an alcoholic.   Social History:  Social History   Substance and Sexual Activity  Alcohol Use Yes   Comment: states that she drinks a couple drinks 2-3 times weekly     Social History   Substance and Sexual Activity  Drug Use No    Social History   Socioeconomic History  . Marital status: Single    Spouse name: Not on file  . Number of children: Not on file  . Years of education: Not on file  . Highest education level: Not on file  Occupational History  . Not on  file  Social Needs  . Financial resource strain: Not on file  . Food insecurity:    Worry: Not on file    Inability: Not on file  . Transportation needs:    Medical: Not on file    Non-medical: Not on file  Tobacco Use  . Smoking status: Never Smoker  . Smokeless tobacco: Never Used  Substance and Sexual Activity  . Alcohol use: Yes    Comment: states that she drinks a couple drinks 2-3 times weekly  . Drug use: No  . Sexual activity: Not Currently    Birth control/protection: None  Lifestyle  . Physical activity:    Days per week: Not on file    Minutes per session: Not on file  . Stress: Not on file  Relationships  . Social connections:    Talks on phone: Not on file    Gets together: Not on file    Attends religious service: Not on file    Active member of club or organization: Not on file    Attends meetings of clubs or organizations: Not on file    Relationship status: Not on file  Other Topics Concern  . Not on file  Social History Narrative  . Not on file    Hospital Course: Patient is seen and examined.  Patient is a 37 year old female with a long-standing past psychiatric history significant for alcohol dependence who apparently presented to the [facility] emergency  department on 8/3 after what was thought to be an intentional overdose of potassium.  Emergency room note stated that the patient had told "someone" that she took potassium pills in an attempt to end her life.  She also was drinking at that time.  In the emergency room her blood alcohol was 526.  Her potassium was 4.8.  The patient was kept in the emergency room for 2 days.  Attempts to evaluate the patient from a psychiatric perspective were delayed by agitation as well as oversedation from alcohol, Ativan and withdrawal.  The patient's friend stated that the patient indicated that she had taken potassium and an overdose to end her life.  She admitted to drinking 2-3 mixed drinks 2-3 times a week, and that  was because she was unable to afford to drink any more than that.  She is currently unemployed.  However on the day prior to admission she drank at least 1/5 of liquor.  The patient has been hospitalized multiple times to the [facility] regional hospital for detox as well as alcohol related issues.  She is had alcohol related delirium, and alcohol related seizure as well as aspiration pneumonia.  In the emergency department the patient denied that she was depressed nor that she was suicidal.  At that time she was requesting to be discharged home.  She lives with her parents.  She stated that she knew she was an alcoholic and was hoping to have an opportunity either to go back to a [facility] facility, or perhaps [facility].  She stated she thought she would be discharged from the emergency room and was surprised to have been brought here.  She was admitted to the hospital for evaluation and stabilization.  After the above information and presenting symptoms evaluated, patient declined to start any medication for depression or anxiety.  Because of her history of delirium and seizure she was placed on withdrawal precautions as well as seizure precautions. She denied any withdrawal symptoms during her hospital course and there were no seizure activity noted. She was started on B12, folate and a multivitamin, Campral 666 mg TID for alcoholism  as well as Trazodone 50 mg po daily at bedtime as needed for sleep and Vistaril 25 mg po TID as needed for anxiety. Sheila Graves was  adherent with treatment as noted. Patient tolerated the medications without any reported side effects are adverse reactions.  Patient was enrolled & participated in the group counseling sessions being offerred & held on this unit.   Sheila Graves is seen today by the attending psychiatrist for discharge. Patient denies any delusions, no hallucinations or other psychotic process. Patient denies active or passive suicidal thoughts. No thoughts of violence. No  craving for drugs. Endorses overall improvement in mood emotional state although she minimized a lot during her hospital course including her suspected overdose as well as her depression though she did recognize her alcohol abuse and was open to outpatient substance abuse treatment.     Nursing staff reports that patient has been appropriate on the unit. Patient has been interacting well with peers. No behavioral issues. Patient has not voiced any suicidal thoughts. Patient was discussed at the treatment team meeting this morning. Team agrees with plan to discharge patient today. Patient was provided with all follow-up information to resume mental health treatment following discharge as noted below. Diantha was provided with a prescription for her [facility] discharge medications as well as a 7 day supply of samples.  Patient left [facility]  with all personal belongings in no apparent distress. Suicide Prevention Education  completed with patient and her father. Transportation per patient/ family arrangement.   Labs: blood alcohol level was 526 and potassium was 4.8. Pregnancy negative. TSH 8.210. Will reorder and add Free T4 and Free T3. Potassium I snow 3.4, ALT 64 and total bilirubin 1.5 otherwise CMP normal.  UDS negative.     Physical Findings: AIMS: Facial and Oral Movements Muscles of Facial Expression: None, normal Lips and Perioral Area: None, normal Jaw: None, normal Tongue: None, normal,Extremity Movements Upper (arms, wrists, hands, fingers): None, normal Lower (legs, knees, ankles, toes): None, normal, Trunk Movements Neck, shoulders, hips: None, normal, Overall Severity Severity of abnormal movements (highest score from questions above): None, normal Incapacitation due to abnormal movements: None, normal Patient's awareness of abnormal movements (rate only patient's report): No Awareness, Dental Status Current problems with teeth and/or dentures?: No Does patient usually wear dentures?: No   CIWA:  CIWA-Ar Total: 0 COWS:     Musculoskeletal: Strength & Muscle Tone: within normal limits Gait & Station: normal Patient leans: N/A  Psychiatric Specialty Exam: SEE SRA BY MD  Physical Exam  Nursing note and vitals reviewed. Constitutional: She is oriented to person, place, and time.  Neurological: She is alert and oriented to person, place, and time.    Review of Systems  Psychiatric/Behavioral: Positive for substance abuse. Negative for hallucinations, memory loss and suicidal ideas. Depression: stable. Nervous/anxious: stable. Insomnia: stable.   All other systems reviewed and are negative.   Blood pressure 108/81, pulse (!) 107, temperature 99.4 F (37.4 C), temperature source Oral, resp. rate 16, height 5\' 10"  (1.778 m), weight 61.2 kg (135 lb), SpO2 100 %.Body mass index is 19.37 kg/m.      Has this patient used any form of tobacco in the last 30 days? (Cigarettes, Smokeless Tobacco, Cigars, and/or Pipes)  N/A  Blood Alcohol level:  Lab Results  Component Value Date   ETH 526 (HH) 01/29/2018    Metabolic Disorder Labs:  No results found for: HGBA1C, MPG No results found for: PROLACTIN Lab Results  Component Value Date   CHOL 230 (H) 02/02/2018   TRIG 142 02/02/2018   HDL 110 02/02/2018   CHOLHDL 2.1 02/02/2018   VLDL 28 02/02/2018   LDLCALC 92 02/02/2018    See Psychiatric Specialty Exam and Suicide Risk Assessment completed by Attending Physician prior to discharge.  Discharge destination:  Home  Is patient on multiple antipsychotic therapies at discharge:  No   Has Patient had three or more failed trials of antipsychotic monotherapy by history:  No  Recommended Plan for Multiple Antipsychotic Therapies: NA  Discharge Instructions    Discharge instructions   Complete by:  As directed    Discharge Recommendations:  The patient is being discharged to her family. Patient is to take her discharge medications as ordered.  See follow up above. We  recommend that she participate in individual therapy to target depression, anxiety, SI, alcohol abuse and improving coping skills.  Patient will benefit from monitoring of recurrence suicidal ideation since patient is on antidepressant medication. The patient should abstain from all illicit substances and alcohol.  If the patient's symptoms worsen or do not continue to improve or if the patient becomes actively suicidal or homicidal then it is recommended that the patient return to the closest hospital emergency room or call 911 for further evaluation and treatment.  National Suicide Prevention Lifeline 1800-SUICIDE or [phone] 752-5325. Please follow up with your  primary medical doctor for all other medical needs.  The patient has been educated on the possible side effects to medications and she/her guardian is to contact a medical professional and inform outpatient provider of any new side effects of medication. She is to take regular diet and activity as tolerated.  Patient would benefit from a daily moderate exercise. Family was educated about removing/locking any firearms, medications or dangerous products from the home.     Allergies as of 02/02/2018   No Known Allergies     Medication List    TAKE these medications     Indication  acamprosate 333 MG tablet Commonly known as:  CAMPRAL Take 2 tablets (666 mg total) by mouth 3 (three) times daily with meals.  Indication:  Excessive Use of Alcohol   hydrOXYzine 25 MG tablet Commonly known as:  ATARAX/VISTARIL Take 1 tablet (25 mg total) by mouth every 6 (six) hours as needed (anxiety/agitation or CIWA < or = 10).  Indication:  Feeling Anxious   Potassium Chloride ER 20 MEQ Tbcr Take 20 mEq by mouth daily.  Indication:  Low Amount of Potassium in the Blood   thiamine 100 MG tablet Take 1 tablet (100 mg total) by mouth daily. Start taking on:  02/03/2018  Indication:  alcohol dependence   traZODone 50 MG tablet Commonly known as:   DESYREL Take 1 tablet (50 mg total) by mouth at bedtime as needed for sleep.  Indication:  Trouble Sleeping      Follow-up Information    [facility] Follow up on 02/04/2018.   Why:  Assessment for outpatient services with Ellison CarwinFrances Gill on Friday, 8/9 at 8:30AM. Please bring: photo ID and hospital discharge paperwork to this appt. Thank you.  Contact information: [address] [place] [address] [phone]        [facility], [facility] Follow up on 02/22/2018.   Why:  Screening for possible admission on Tuesday, 8/27 at 7:45AM. Please bring photo ID/proof of [place], 30 day supply of medications, and clothing (no leggings or yoga pants). Thank you.  Contact information: [address] [place] [address] [phone]           Follow-up recommendations: Follow up with your outpatient provided for any medical issues. Activity & diet as recommended by your primary care provider.  Comments:  See discharge instructions above.   Signed: Denzil MagnusonLaShunda Thomas, NP 02/02/2018, 9:05 AM   Patient seen, Suicide Assessment Completed.  Disposition Plan Reviewed

## 2018-02-02 NOTE — Progress Notes (Signed)
Patient ID: Sheila DuraLeslie Graves, female   DOB: 1980-10-15, [age] y.o.   MRN: [id number]  Discharge Note  D) Patient discharged to lobby. Patient states readiness for discharge. Patient denies SI/HI, AVH and is not delusional or psychotic. Patient in no acute distress. Patient has completed their Suicide Safety Plan.   A) Written and verbal discharge instructions given to the patient. Patient accepting to information and verbalized understanding. Patient agrees to the discharge plan. Opportunity for questions and concerns presented to patient. Patient denied any further questions or concerns. All belongings returned to patient. Patient signed for return of belongings and discharge paperwork. Patient provided copies of their Suicide Safety Plan.  R) Patient safely escorted to the lobby. [facility] samples, prescriptions, personal belongings, follow-up appointment in place and discharge paperwork.   Addendum:  While in the search room, patient reported items listed on her belonging sheet were properly returned, however, patient reported she was missing a [employer] bag of clothing brought in by her father on Monday. No record of Nike bag found on the Belonging sheet. Patient called her father who confirmed he brought in a "single strapped, Nike bag and two pairs of clothes". Search room overflow and empty lockers were checked. Patient did not have a second locker. Front lobby and visitor lockers checked. No Nike bag found. Patient stated to writer, "it's okay, I get that these things happen, I don't really care". Patient was escorted to lobby and Clinical research associatewriter informed patient she would discuss case with [facility] and [facility]. Patient verbalized understanding and reported she would wait. Vickii PennaAkeysha R, notified. Security Furniture conservator/restorerofficer Danny notified. Two RNs and Engineer, materialssecurity officer rechecked search room. C/A Unit was called to check their search room. Lobby rechecked. No Nike bag found. Writer returned to discuss case  with patient but was informed by receptionist patient had left.

## 2018-02-02 NOTE — [facility] Suicide Risk Assessment (Signed)
[facility] Discharge Suicide Risk Assessment   Principal Problem:  Alcohol Dependence  Discharge Diagnoses:  Patient Active Problem List   Diagnosis Date Noted  . Alcohol dependence with withdrawal (HCC) [F10.239] 01/31/2018  . Major depressive disorder, recurrent, severe without psychotic features (HCC) [F33.2] 01/31/2018    Total Time spent with patient: 30 minutes  Musculoskeletal: Strength & Muscle Tone: within normal limits- no tremors, no diaphoresis, no psychomotor restlessness  Gait & Station: normal Patient leans: N/A  Psychiatric Specialty Exam: ROS denies headache, denies chest pain, no shortness of breath, no vomiting, no fever, no rash  Blood pressure 108/81, pulse (!) 107, temperature 99.4 F (37.4 C), temperature source Oral, resp. rate 16, height 5\' 10"  (1.778 m), weight 61.2 kg (135 lb), SpO2 100 %.Body mass index is 19.37 kg/m.  General Appearance: improving grooming   Eye Contact::  Good  Speech:  Normal Rate  Volume:  Normal  Mood:  improving mood, denies depression at this time  Affect:  appropriate, reactive, smiles at times appropriately.Presents vaguely anxious, improves during session  Thought Process:  Linear and Descriptions of Associations: Intact  Orientation:  Full (Time, Place, and Person)  Thought Content:  no hallucinations, no delusions, not internally preoccupied   Suicidal Thoughts:  No denies suicidal or self injurious ideations, no homicidal or violent ideations  Homicidal Thoughts:  No  Memory:  recent and remote grossly intact   Judgement:  Other:  fair- improving   Insight:  fair- improving  Psychomotor Activity:  Normal  Concentration:  Good  Recall:  Good  Fund of Knowledge:Good  Language: Good  Akathisia:  Yes  Handed:  Right  AIMS (if indicated):     Assets:  Communication Skills Resilience  Sleep:  Number of Hours: 6.75  Cognition: WNL  ADL's:  Intact   Mental Status Per Nursing Assessment::   On Admission:  Suicidal ideation  indicated by others, Self-harm behaviors  Demographic Factors:  37 year old female , no children, unemployed, lives with parents   Loss Factors: Unemployment, recent episode of binge drinking   Historical Factors: History of alcohol use disorder, which she characterizes mostly as binge drinking episodes, no prior psychiatric admissions, no prior suicidal attempts  Risk Reduction Factors:   Sense of responsibility to family, Living with another person, especially a relative, Positive social support and Positive coping skills or problem solving skills  Continued Clinical Symptoms:  At this time patient is alert, attentive, no psychomotor agitation or restlessness, no distal tremors, no diaphoresis, no facial flushing , denies visual disturbances or headache .  Reports mood as "OK" , denies feeling depressed, affect reactive, vaguely anxious, no thought disorder, no suicidal or self injurious ideations.Future oriented, states she is looking forward to finding a job in the near future. Currently no significant symptoms of alcohol WDL and patient states she has been refusing Ativan( on detox protocol) " because I really don't need it". States that before this episode of drinking she had last consumed alcohol 2 weeks ago. Patient acknowledges alcohol use disorder and states that she tries to limit how often she drinks but that when she does she often drinks a lot more than intended and sometimes to blackout. She reports she has found AA meetings helpful and plans to return. Expresses interest in Campral - side effects discussed, will start Campral 666 mgrs TID . With her express consent I spoke with her father, who has been visiting patient on unit and with whom she lives, he corroborates she  is currently doing well , states " she is a lot better, back to her old self ", and is in agreement with discharge- will pick her up later today. Of note, TSH is elevated- patient states she has been diagnosed  with Hypothyroidism in the past, but had stopped synthroid 5 months ago. Father states that he is scheduling her to see PCP in the near future in order to restart hypothyroidism treatment.  Cognitive Features That Contribute To Risk:  No gross cognitive deficits noted upon discharge. Is alert , attentive, and oriented x 3   Suicide Risk:  Mild:  Suicidal ideation of limited frequency, intensity, duration, and specificity.  There are no identifiable plans, no associated intent, mild dysphoria and related symptoms, good self-control (both objective and subjective assessment), few other risk factors, and identifiable protective factors, including available and accessible social support.  Follow-up Information    [facility] Follow up on 02/04/2018.   Why:  Assessment for outpatient services with Ellison CarwinFrances Gill on Friday, 8/9 at 8:30AM. Please bring: photo ID and hospital discharge paperwork to this appt. Thank you.  Contact information: [address] [place] [address] [phone]        [facility] Follow up on 02/22/2018.   Why:  Screening for possible admission on Tuesday, 8/27 at 7:45AM. Please bring photo ID/proof of [place], 30 day supply of medications, and clothing (no leggings or yoga pants). Thank you.  Contact information: [address] [place] [address] [phone]           Plan Of Care/Follow-up recommendations:  Activity:  as tolerated  Diet:  regular Tests:  NA Other:  See below  Patient is expressing readiness for discharge- at this time there are no grounds for ongoing involuntary commitment Plans to return home Plans to follow up as above, also plans to attend AA, and plans to follow up with PCP for management as above  Craige CottaFernando A Cobos, MD 02/02/2018, 10:00 AM

## 2018-06-18 ENCOUNTER — Emergency Department (HOSPITAL_COMMUNITY): Payer: Self-pay

## 2018-06-18 ENCOUNTER — Emergency Department (HOSPITAL_COMMUNITY)
Admission: EM | Admit: 2018-06-18 | Discharge: 2018-06-19 | Disposition: A | Discharge status: Home / Self Care | Payer: Self-pay | Attending: Emergency Medicine | Admitting: Emergency Medicine

## 2018-06-18 DIAGNOSIS — F10929 Alcohol use, unspecified with intoxication, unspecified: Secondary | ICD-10-CM | POA: Insufficient documentation

## 2018-06-18 LAB — CBC
HCT: 45.5 % (ref 36.0–46.0)
Hemoglobin: 15.4 g/dL — ABNORMAL HIGH (ref 12.0–15.0)
MCH: 33.9 pg (ref 26.0–34.0)
MCHC: 33.8 g/dL (ref 30.0–36.0)
MCV: 100.2 fL — ABNORMAL HIGH (ref 80.0–100.0)
Platelets: 195 10*3/uL (ref 150–400)
RBC: 4.54 MIL/uL (ref 3.87–5.11)
RDW: 12.2 % (ref 11.5–15.5)
WBC: 6 10*3/uL (ref 4.0–10.5)
nRBC: 0 % (ref 0.0–0.2)

## 2018-06-18 LAB — BLOOD GAS, ARTERIAL
ACID-BASE DEFICIT: 1.1 mmol/L (ref 0.0–2.0)
Bicarbonate: 23.8 mmol/L (ref 20.0–28.0)
Drawn by: 308601
FIO2: 21
O2 SAT: 93.9 %
PCO2 ART: 39.9 mmHg (ref 32.0–48.0)
Patient temperature: 96.1
pH, Arterial: 7.384 (ref 7.350–7.450)
pO2, Arterial: 77.5 mmHg — ABNORMAL LOW (ref 83.0–108.0)

## 2018-06-18 LAB — RAPID URINE DRUG SCREEN, HOSP PERFORMED
Amphetamines: NOT DETECTED
Barbiturates: NOT DETECTED
Benzodiazepines: NOT DETECTED
Cocaine: NOT DETECTED
Opiates: NOT DETECTED
Tetrahydrocannabinol: NOT DETECTED

## 2018-06-18 LAB — ETHANOL: ALCOHOL ETHYL (B): 496 mg/dL — AB (ref <10)

## 2018-06-18 LAB — URINALYSIS, ROUTINE W REFLEX MICROSCOPIC
Bacteria, UA: NONE SEEN
Bilirubin Urine: NEGATIVE
Glucose, UA: NEGATIVE mg/dL
Ketones, ur: 20 mg/dL — AB
Leukocytes, UA: NEGATIVE
Nitrite: NEGATIVE
Protein, ur: 100 mg/dL — AB
Specific Gravity, Urine: 1.008 (ref 1.005–1.030)
pH: 5 (ref 5.0–8.0)

## 2018-06-18 LAB — COMPREHENSIVE METABOLIC PANEL
ALT: 29 U/L (ref 0–44)
AST: 49 U/L — ABNORMAL HIGH (ref 15–41)
Albumin: 4.6 g/dL (ref 3.5–5.0)
Alkaline Phosphatase: 66 U/L (ref 38–126)
Anion gap: 16 — ABNORMAL HIGH (ref 5–15)
BUN: 14 mg/dL (ref 6–20)
CHLORIDE: 104 mmol/L (ref 98–111)
CO2: 25 mmol/L (ref 22–32)
Calcium: 8.5 mg/dL — ABNORMAL LOW (ref 8.9–10.3)
Creatinine, Ser: 0.89 mg/dL (ref 0.44–1.00)
GFR calc Af Amer: >60 mL/min (ref >60)
GFR calc non Af Amer: >60 mL/min (ref >60)
Glucose, Bld: 83 mg/dL (ref 70–99)
Potassium: 3.8 mmol/L (ref 3.5–5.1)
Sodium: 145 mmol/L (ref 135–145)
Total Bilirubin: 1 mg/dL (ref 0.3–1.2)
Total Protein: 6.9 g/dL (ref 6.5–8.1)

## 2018-06-18 LAB — AMMONIA: Ammonia: 18 umol/L (ref 9–35)

## 2018-06-18 LAB — I-STAT BETA HCG BLOOD, ED (MC, WL, AP ONLY)

## 2018-06-18 LAB — TROPONIN I: Troponin I: <0.03 ng/mL (ref <0.03)

## 2018-06-18 LAB — CBG MONITORING, ED: Glucose-Capillary: 76 mg/dL (ref 70–99)

## 2018-06-18 NOTE — ED Notes (Signed)
Bed: ZO10WA18 Expected date:  Expected time:  Means of arrival:  Comments: 37 yo F/ Syncope

## 2018-06-18 NOTE — ED Notes (Signed)
Date and time results received: 06/18/18 10:00 PM  (use smartphrase ".now" to insert current time)  Test: Alcohol Critical Value: 496  Name of Provider Notified: Dr.Wentz  Orders Received? Or Actions Taken?:none

## 2018-06-18 NOTE — Discharge Instructions (Signed)
Avoid drinking all forms of alcohol.  You and your fianc should go to Alcoholics Anonymous for counseling and assistance.  We have supplied a resource guide to help find places to go to for additional assistance.  Your alcohol level is extremely high tonight.  Keep someone with you at all times until you are sober which might take 36 hours.  Return here if needed for problems.

## 2018-06-18 NOTE — ED Triage Notes (Signed)
Pt comes to ed via ems, passed out in bathroom face down with ETOH. Pt has Iv 18 in left wrist.  Hr 82, sp02 99, bp 107/77, cbg 106,  Rescue breathing done.
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
## 2018-06-23 ENCOUNTER — Emergency Department (HOSPITAL_COMMUNITY)
Admission: EM | Admit: 2018-06-23 | Discharge: 2018-06-24 | Disposition: A | Discharge status: Home / Self Care | Payer: Self-pay | Attending: Emergency Medicine | Admitting: Emergency Medicine

## 2018-06-23 DIAGNOSIS — T401X1A Poisoning by heroin, accidental (unintentional), initial encounter: Secondary | ICD-10-CM | POA: Insufficient documentation

## 2018-06-23 DIAGNOSIS — Z79899 Other long term (current) drug therapy: Secondary | ICD-10-CM | POA: Insufficient documentation

## 2018-06-23 DIAGNOSIS — F1190 Opioid use, unspecified, uncomplicated: Secondary | ICD-10-CM
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
## 2018-06-24 ENCOUNTER — Encounter (HOSPITAL_COMMUNITY): Payer: Self-pay | Admitting: Family Medicine

## 2018-06-24 LAB — CBC WITH DIFFERENTIAL/PLATELET
ABS IMMATURE GRANULOCYTES: 0.15 10*3/uL — AB (ref 0.00–0.07)
Basophils Absolute: 0.1 10*3/uL (ref 0.0–0.1)
Basophils Relative: 1 %
Eosinophils Absolute: 0.2 10*3/uL (ref 0.0–0.5)
Eosinophils Relative: 2 %
HCT: 45.8 % (ref 36.0–46.0)
Hemoglobin: 14.5 g/dL (ref 12.0–15.0)
IMMATURE GRANULOCYTES: 1 %
LYMPHS ABS: 3 10*3/uL (ref 0.7–4.0)
Lymphocytes Relative: 25 %
MCH: 31 pg (ref 26.0–34.0)
MCHC: 31.7 g/dL (ref 30.0–36.0)
MCV: 98.1 fL (ref 80.0–100.0)
Monocytes Absolute: 0.9 10*3/uL (ref 0.1–1.0)
Monocytes Relative: 7 %
Neutro Abs: 7.7 10*3/uL (ref 1.7–7.7)
Neutrophils Relative %: 64 %
Platelets: 246 10*3/uL (ref 150–400)
RBC: 4.67 MIL/uL (ref 3.87–5.11)
RDW: 13.4 % (ref 11.5–15.5)
WBC: 12.1 10*3/uL — ABNORMAL HIGH (ref 4.0–10.5)
nRBC: 0 % (ref 0.0–0.2)

## 2018-06-24 LAB — COMPREHENSIVE METABOLIC PANEL
ALBUMIN: 3.9 g/dL (ref 3.5–5.0)
ALT: 89 U/L — AB (ref 0–44)
AST: 172 U/L — ABNORMAL HIGH (ref 15–41)
Alkaline Phosphatase: 102 U/L (ref 38–126)
Anion gap: 8 (ref 5–15)
BUN: 14 mg/dL (ref 6–20)
CO2: 26 mmol/L (ref 22–32)
Calcium: 8.3 mg/dL — ABNORMAL LOW (ref 8.9–10.3)
Chloride: 109 mmol/L (ref 98–111)
Creatinine, Ser: 0.76 mg/dL (ref 0.44–1.00)
GFR calc Af Amer: >60 mL/min (ref >60)
GFR calc non Af Amer: >60 mL/min (ref >60)
GLUCOSE: 126 mg/dL — AB (ref 70–99)
Potassium: 3.9 mmol/L (ref 3.5–5.1)
Sodium: 143 mmol/L (ref 135–145)
Total Bilirubin: 0.6 mg/dL (ref 0.3–1.2)
Total Protein: 6.8 g/dL (ref 6.5–8.1)

## 2018-06-24 LAB — RAPID URINE DRUG SCREEN, HOSP PERFORMED
Amphetamines: NOT DETECTED
Barbiturates: NOT DETECTED
Benzodiazepines: NOT DETECTED
Cocaine: NOT DETECTED
Opiates: NOT DETECTED
Tetrahydrocannabinol: POSITIVE — AB

## 2018-06-24 LAB — PREGNANCY, URINE: Preg Test, Ur: NEGATIVE

## 2018-06-24 LAB — ACETAMINOPHEN LEVEL: Acetaminophen (Tylenol), Serum: 44 ug/mL — ABNORMAL HIGH (ref 10–30)

## 2018-06-24 LAB — ETHANOL: Alcohol, Ethyl (B): <10 mg/dL (ref <10)

## 2018-06-24 LAB — SALICYLATE LEVEL: Salicylate Lvl: <7 mg/dL (ref 2.8–30.0)

## 2018-06-24 NOTE — Discharge Instructions (Signed)
Avoid using heroin.  Resources attached if you want help getting sober again. Return here for any new/acute changes.
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
## 2018-08-25 ENCOUNTER — Emergency Department (HOSPITAL_COMMUNITY)
Admission: EM | Admit: 2018-08-25 | Discharge: 2018-08-25 | Disposition: A | Discharge status: Home / Self Care | Payer: Self-pay | Attending: Emergency Medicine | Admitting: Emergency Medicine

## 2018-08-25 ENCOUNTER — Encounter (HOSPITAL_COMMUNITY): Payer: Self-pay

## 2018-08-25 ENCOUNTER — Other Ambulatory Visit: Payer: Self-pay

## 2018-08-25 DIAGNOSIS — F17210 Nicotine dependence, cigarettes, uncomplicated: Secondary | ICD-10-CM | POA: Insufficient documentation

## 2018-08-25 DIAGNOSIS — Z79899 Other long term (current) drug therapy: Secondary | ICD-10-CM | POA: Insufficient documentation

## 2018-08-25 DIAGNOSIS — Y908 Blood alcohol level of 240 mg/100 ml or more: Secondary | ICD-10-CM | POA: Insufficient documentation

## 2018-08-25 DIAGNOSIS — F10229 Alcohol dependence with intoxication, unspecified: Secondary | ICD-10-CM | POA: Insufficient documentation

## 2018-08-25 DIAGNOSIS — F10929 Alcohol use, unspecified with intoxication, unspecified: Secondary | ICD-10-CM

## 2018-08-25 LAB — RAPID URINE DRUG SCREEN, HOSP PERFORMED
Amphetamines: NOT DETECTED
Barbiturates: NOT DETECTED
Benzodiazepines: NOT DETECTED
Cocaine: NOT DETECTED
Opiates: NOT DETECTED
Tetrahydrocannabinol: NOT DETECTED

## 2018-08-25 LAB — CBC
HCT: 41.3 % (ref 36.0–46.0)
Hemoglobin: 14.1 g/dL (ref 12.0–15.0)
MCH: 34.5 pg — ABNORMAL HIGH (ref 26.0–34.0)
MCHC: 34.1 g/dL (ref 30.0–36.0)
MCV: 101 fL — ABNORMAL HIGH (ref 80.0–100.0)
Platelets: 177 10*3/uL (ref 150–400)
RBC: 4.09 MIL/uL (ref 3.87–5.11)
RDW: 14.6 % (ref 11.5–15.5)
WBC: 2.9 10*3/uL — ABNORMAL LOW (ref 4.0–10.5)
nRBC: 0 % (ref 0.0–0.2)

## 2018-08-25 LAB — COMPREHENSIVE METABOLIC PANEL
ALT: 35 U/L (ref 0–44)
ANION GAP: 11 (ref 5–15)
AST: 71 U/L — ABNORMAL HIGH (ref 15–41)
Albumin: 4.4 g/dL (ref 3.5–5.0)
Alkaline Phosphatase: 83 U/L (ref 38–126)
BUN: 14 mg/dL (ref 6–20)
CO2: 28 mmol/L (ref 22–32)
Calcium: 8.8 mg/dL — ABNORMAL LOW (ref 8.9–10.3)
Chloride: 101 mmol/L (ref 98–111)
Creatinine, Ser: 0.71 mg/dL (ref 0.44–1.00)
GFR calc Af Amer: >60 mL/min (ref >60)
GFR calc non Af Amer: >60 mL/min (ref >60)
Glucose, Bld: 89 mg/dL (ref 70–99)
Potassium: 3.9 mmol/L (ref 3.5–5.1)
Sodium: 140 mmol/L (ref 135–145)
Total Bilirubin: 0.5 mg/dL (ref 0.3–1.2)
Total Protein: 7.4 g/dL (ref 6.5–8.1)

## 2018-08-25 LAB — ETHANOL: Alcohol, Ethyl (B): 459 mg/dL (ref <10)

## 2018-08-25 LAB — MAGNESIUM: Magnesium: 2.3 mg/dL (ref 1.7–2.4)

## 2018-08-25 LAB — I-STAT BETA HCG BLOOD, ED (MC, WL, AP ONLY)

## 2018-08-25 MED ORDER — SODIUM CHLORIDE 0.9 % IV SOLN
1000.0000 mL | INTRAVENOUS | Status: DC
  Administered 2018-08-25: 1000 mL via INTRAVENOUS

## 2018-08-25 MED ORDER — SODIUM CHLORIDE 0.9 % IV BOLUS (SEPSIS)
1000.0000 mL | Freq: Once | INTRAVENOUS | Status: AC
  Administered 2018-08-25: 1000 mL via INTRAVENOUS

## 2018-08-25 NOTE — ED Notes (Signed)
Pt ambulatory to restroom

## 2018-08-25 NOTE — ED Notes (Signed)
Pt requesting social work for help with bills

## 2018-08-25 NOTE — Discharge Instructions (Addendum)
Please review the discharge instructions regarding outpatient resources for alcoholism

## 2018-08-25 NOTE — ED Notes (Signed)
Pt requested and provided toothbrush and paste. Pt ambulatory to restroom

## 2018-08-25 NOTE — ED Triage Notes (Signed)
[facility] [employer] [facility]. Pt was slurring speech and speaking incoherently to staff. Pt then began to obsess about the location of her phone (in her lap) then started crying about being admitted to the psych unit.  Questionable seizure PTA.  Fiance was called, pt did not recall doing so.  Pt denied SI/HI with EMS. Pt arrives on phone, crying. Possible ETOH on board. Hx of abuse Pt has not had dilantin or potassium as prescribed x 4 days.  Pt states that she "doesn't feel okay. I don't feel normal." Pt admits to ETOH this morning, but does not know how much she has had. Denies SI/HI. Pt states she is crying because he fiance isn't here.

## 2018-08-25 NOTE — ED Notes (Signed)
Family friend at the bedside

## 2018-08-25 NOTE — ED Provider Notes (Signed)
[facility]-EMERGENCY DEPT Provider Note   CSN: [id number] Arrival date & time: 08/25/18  1016    History   Chief Complaint Chief Complaint  Patient presents with  . Alcohol Intoxication  . Seizures    HPI Sheila Graves is a 38 y.o. female.  HPI Patient presents to the emergency room for evaluation after a probable seizure.  Patient states she admits to drinking alcohol this morning although she specifically states she cannot tell me how much she had to drink this morning. "I cannot tell you how f**ing much I had to drink"  Patient does remember going to get her car worked on.  Patient states she thinks she might of had a seizure.  The next thing she remembers is EMS bringing her to this hospital.  According to the EMS report, the patient was at a car repair shop.  She was slurring her speech and speaking incoherently.  Patient then started crying.  There was questionable seizure-like activity.  EMS was called.  Patient denies any suicidal or homicidal ideation.  She does have a history of alcohol abuse.  She has also had trouble with hypokalemia in the past. Past Medical History:  Diagnosis Date  . Alcohol withdrawal seizure (HCC)   . ETOH abuse   . Hypokalemia   . Pancreatitis   . Thyroid disease     Patient Active Problem List   Diagnosis Date Noted  . Alcohol dependence with withdrawal (HCC) 01/31/2018  . 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 FINGER SURGERY       OB History   No obstetric history on file.      Home Medications    Prior to Admission medications   Medication Sig Start Date End Date Taking? Authorizing Provider  acamprosate (CAMPRAL) 333 MG tablet Take 2 tablets (666 mg total) by mouth 3 (three) times daily with meals. 02/02/18   Denzil Magnuson, NP  hydrOXYzine (ATARAX/VISTARIL) 25 MG tablet Take 1 tablet (25 mg total) by  mouth every 6 (six) hours as needed (anxiety/agitation or CIWA < or = 10). 02/02/18   Denzil Magnuson, NP  potassium chloride 20 MEQ TBCR Take 20 mEq by mouth daily. 09/10/16   Audry Pili, PA-C  thiamine 100 MG tablet Take 1 tablet (100 mg total) by mouth daily. 02/03/18   Denzil Magnuson, NP  traZODone (DESYREL) 50 MG tablet Take 1 tablet (50 mg total) by mouth at bedtime as needed for sleep. 02/02/18   Denzil Magnuson, NP    Family History No family history on file.  Social History Social History   Tobacco Use  . Smoking status: Current Every Day Smoker    Packs/day: 1.00  . Smokeless tobacco: Never Used  Substance Use Topics  . Alcohol use: Yes    Comment: states that she drinks a couple drinks 2-3 times weekly  . Drug use: Yes    Comment: Heroin: Daily      Allergies   Patient has no known allergies.   Review of Systems Review of Systems  All other systems reviewed and are negative.    Physical Exam Updated Vital Signs BP 91/66   Pulse 83   Temp 97.8 F (36.6 C) (Oral)   Resp 16   Wt 72.6 kg   LMP 08/18/2018   SpO2 99%   BMI 23.64 kg/m   Physical Exam Vitals signs and nursing note reviewed.  Constitutional:  General: She is not in acute distress.    Appearance: She is well-developed. She is not ill-appearing or toxic-appearing.  HENT:     Head: Normocephalic and atraumatic.     Right Ear: External ear normal.     Left Ear: External ear normal.  Eyes:     General: No scleral icterus.       Right eye: No discharge.        Left eye: No discharge.     Conjunctiva/sclera: Conjunctivae normal.  Neck:     Musculoskeletal: Neck supple.     Trachea: No tracheal deviation.  Cardiovascular:     Rate and Rhythm: Normal rate and regular rhythm.  Pulmonary:     Effort: Pulmonary effort is normal. No respiratory distress.     Breath sounds: Normal breath sounds. No stridor. No wheezing or rales.  Abdominal:     General: Bowel sounds are normal. There is no  distension.     Palpations: Abdomen is soft.     Tenderness: There is no abdominal tenderness. There is no guarding or rebound.  Musculoskeletal:        General: No tenderness.  Skin:    General: Skin is warm and dry.     Findings: No rash.  Neurological:     Mental Status: She is alert.     Cranial Nerves: No cranial nerve deficit (no facial droop, extraocular movements intact, no slurred speech).     Sensory: No sensory deficit.     Motor: No abnormal muscle tone or seizure activity.     Coordination: Coordination normal.     Comments: No focal deficits, speech mildly slurred, alert and oriented  Psychiatric:        Mood and Affect: Affect is angry.        Behavior: Behavior is not combative.        Thought Content: Thought content does not include homicidal or suicidal ideation.      ED Treatments / Results  Labs (all labs ordered are listed, but only abnormal results are displayed) Labs Reviewed  CBC - Abnormal; Notable for the following components:      Result Value   WBC 2.9 (*)    MCV 101.0 (*)    MCH 34.5 (*)    All other components within normal limits  COMPREHENSIVE METABOLIC PANEL - Abnormal; Notable for the following components:   Calcium 8.8 (*)    AST 71 (*)    All other components within normal limits  ETHANOL - Abnormal; Notable for the following components:   Alcohol, Ethyl (B) 459 (*)    All other components within normal limits  MAGNESIUM  RAPID URINE DRUG SCREEN, HOSP PERFORMED  I-STAT BETA HCG BLOOD, ED ([facility], [facility], [facility] ONLY)     Procedures Procedures (including critical care time)  Medications Ordered in ED Medications  sodium chloride 0.9 % bolus 1,000 mL (0 mLs Intravenous Stopped 08/25/18 1202)    Followed by  0.9 %  sodium chloride infusion ( Intravenous Rate/Dose Verify 08/25/18 1441)     Initial Impression / Assessment and Plan / ED Course  I have reviewed the triage vital signs and the nursing notes.  Pertinent labs & imaging results  that were available during my care of the patient were reviewed by me and considered in my medical decision making (see chart for details).  Clinical Course as of Aug 26 1547  Thu Aug 25, 2018  1155 Labs reviewed.  White blood cell count slightly  decreased but I doubt this is clinically significant.  Electrolyte panel otherwise unremarkable   [JK]  1230 Pt provided something to eat   [JK]  1245 Patient's ethanol level is extremely elevated.  Previous labs have been reviewed and patient has had this degree of intoxication in the past   [JK]  1301 Patient is angry about staying.  She asked me to speak to her mother so that her mother could give consent for the patient to take an uber home.  She however will not allow me to talk to her mother about any of her test results.  I explained to the patient that I do not intend to hold her for a prolonged period of time but I would like her to be more sober and safe to travel.   [JK]  1546 Pt is resting comfortably   [JK]    Clinical Course User Index [JK] Linwood Dibbles, MD     Patient presented to the emergency room for evaluation of questionable seizure activity and slurred speech.  Suspect that the patient symptoms were related to her alcohol intoxication.  She has not had any seizure activity here in the emergency room.  Patient counseled on alcohol cessation.  She will be discharged with her fianc who will be able to monitor her. Final Clinical Impressions(s) / ED Diagnoses   Final diagnoses:  Alcoholic intoxication with complication [facility])    ED Discharge Orders    None       Linwood Dibbles, MD 08/25/18 1549

## 2018-08-25 NOTE — ED Notes (Signed)
Bed: [facility] Expected date:  Expected time:  Means of arrival:  Comments: EMS-possible SZ/? psych

## 2018-08-25 NOTE — ED Notes (Signed)
Date and time results received: 08/25/18 12:06 PM  (use smartphrase ".now" to insert current time)  Test: ETOH  Critical Value: 459  Name of Provider Notified: Megan RN   Orders Received? Or Actions Taken?:

## 2018-08-25 NOTE — Progress Notes (Signed)
   08/25/18 1200  Clinical Encounter Type  Visited With Patient  Visit Type Initial;Psychological support;Spiritual support;ED  Referral From Patient  Consult/Referral To Chaplain  Spiritual Encounters  Spiritual Needs Emotional;Other (Comment) (Spiritual Care Conversation/Support)  Stress Factors  Patient Stress Factors Health changes;Other (Comment)   I briefly spoke to the patient when she motioned for me to come over to her. The patient was extremely angry over not being able to eat. I deferred this to the nurse.    Please, contact Spiritual Care for further assistance.   Chaplain Clint Bolder M.Div., [facility]

## 2018-08-25 NOTE — ED Notes (Signed)
Pt requests that no results be shared in front of visitors.

## 2018-09-14 ENCOUNTER — Encounter (HOSPITAL_BASED_OUTPATIENT_CLINIC_OR_DEPARTMENT_OTHER): Payer: Self-pay | Admitting: Emergency Medicine

## 2018-09-14 ENCOUNTER — Emergency Department (HOSPITAL_BASED_OUTPATIENT_CLINIC_OR_DEPARTMENT_OTHER): Payer: Self-pay

## 2018-09-14 ENCOUNTER — Emergency Department (HOSPITAL_BASED_OUTPATIENT_CLINIC_OR_DEPARTMENT_OTHER)
Admission: EM | Admit: 2018-09-14 | Discharge: 2018-09-14 | Disposition: A | Discharge status: Home / Self Care | Payer: Self-pay | Attending: Emergency Medicine | Admitting: Emergency Medicine

## 2018-09-14 ENCOUNTER — Other Ambulatory Visit: Payer: Self-pay

## 2018-09-14 DIAGNOSIS — Y93K1 Activity, walking an animal: Secondary | ICD-10-CM | POA: Insufficient documentation

## 2018-09-14 DIAGNOSIS — S0083XA Contusion of other part of head, initial encounter: Secondary | ICD-10-CM | POA: Insufficient documentation

## 2018-09-14 DIAGNOSIS — S0081XA Abrasion of other part of head, initial encounter: Secondary | ICD-10-CM | POA: Insufficient documentation

## 2018-09-14 DIAGNOSIS — W010XXA Fall on same level from slipping, tripping and stumbling without subsequent striking against object, initial encounter: Secondary | ICD-10-CM | POA: Insufficient documentation

## 2018-09-14 DIAGNOSIS — Y929 Unspecified place or not applicable: Secondary | ICD-10-CM | POA: Insufficient documentation

## 2018-09-14 DIAGNOSIS — S161XXA Strain of muscle, fascia and tendon at neck level, initial encounter: Secondary | ICD-10-CM | POA: Insufficient documentation

## 2018-09-14 DIAGNOSIS — S0990XA Unspecified injury of head, initial encounter: Secondary | ICD-10-CM

## 2018-09-14 DIAGNOSIS — Y999 Unspecified external cause status: Secondary | ICD-10-CM | POA: Insufficient documentation

## 2018-09-14 DIAGNOSIS — F17210 Nicotine dependence, cigarettes, uncomplicated: Secondary | ICD-10-CM | POA: Insufficient documentation

## 2018-09-14 NOTE — ED Notes (Signed)
PT being verbally abusive to this RN. Stating " I need a phone right the fuck now". Call bell placed with in reach. Bed secured. Warm blanket given. Assisted to Use bedpan.

## 2018-09-14 NOTE — ED Notes (Signed)
Patient transported to CT 

## 2018-09-14 NOTE — ED Triage Notes (Signed)
Walking dog intoxicated, fell. EMS transport. Laceration on nose and left eye. Bleeding controlled.

## 2018-09-14 NOTE — ED Notes (Signed)
Pt intoxicated, called the nurses assisting her in the room with triage "bitches" MD at bedside and pt yelling at MD also, stating we are not helping her. Pt repeating phrases, slurred intoxicated speech. Smell of ETOH.

## 2018-09-14 NOTE — Discharge Instructions (Signed)

## 2018-09-14 NOTE — ED Provider Notes (Signed)
Emergency Department Provider Note   I have reviewed the triage vital signs and the nursing notes.   HISTORY  Chief Complaint Laceration   HPI Sheila Graves is a 38 y.o. female with PMH of polysubstance abuse presents to the emergency department for evaluation after fall.  Patient states that she was chasing a dog that got away from her on the leash.  She states that in the process of chasing the dog she was able to get it back but fell and scraped the left side of her face on the ground.  She began bleeding.  EMS was called and transported the patient here.  She denies any seizure activity.  No pain other places other than her face.  She states that she has been drinking does not know how much.  She denies using other drugs.   Past Medical History:  Diagnosis Date   Alcohol withdrawal seizure (HCC)    ETOH abuse    Hypokalemia    Pancreatitis    Thyroid disease     Patient Active Problem List   Diagnosis Date Noted   Alcohol dependence with withdrawal (HCC) 01/31/2018   Major depressive disorder, recurrent, severe without psychotic features (HCC) 01/31/2018    Past Surgical History:  Procedure Laterality Date   FINGER SURGERY     Allergies Patient has no known allergies.  No family history on file.  Social History Social History   Tobacco Use   Smoking status: Current Every Day Smoker    Packs/day: 1.00   Smokeless tobacco: Never Used  Substance Use Topics   Alcohol use: Yes    Comment: states that she drinks a couple drinks 2-3 times weekly   Drug use: Yes    Comment: Heroin: Daily     Review of Systems  Constitutional: No fever/chills Eyes: No visual changes. ENT: No sore throat. Cardiovascular: Denies chest pain. Respiratory: Denies shortness of breath. Gastrointestinal: No abdominal pain.  No nausea, no vomiting.  No diarrhea.  No constipation. Genitourinary: Negative for dysuria. Musculoskeletal: Negative for back pain. Skin: Negative  for rash. Positive face abrasion.  Neurological: Negative for focal weakness or numbness. Positive HA.   10-point ROS otherwise negative.  ____________________________________________   PHYSICAL EXAM:  VITAL SIGNS: ED Triage Vitals  Enc Vitals Group     BP 09/14/18 1942 109/73     Pulse Rate 09/14/18 1942 82     Resp --      Temp 09/14/18 1942 98.2 F (36.8 C)     Temp Source 09/14/18 1942 Oral     SpO2 09/14/18 1942 100 %     Weight 09/14/18 1938 135 lb (61.2 kg)     Height 09/14/18 1938 5\' 11"  (1.803 m)     Pain Score 09/14/18 1937 8   Constitutional: Alert and oriented. Well appearing and in no acute distress. Eyes: Conjunctivae are normal. PERRL. EOMI. Head: Abrasion to the left lateral face and cheek with swelling. No scalp abrasion, laceration, or hematoma.  Nose: No congestion/rhinnorhea. Mouth/Throat: Mucous membranes are moist.  Neck: No stridor. No cervical spine tenderness to palpation. Cardiovascular: Normal rate, regular rhythm. Good peripheral circulation. Grossly normal heart sounds.   Respiratory: Normal respiratory effort.  No retractions. Lungs CTAB. Gastrointestinal: Soft and nontender. No distention.  Musculoskeletal: No lower extremity tenderness nor edema. No gross deformities of extremities. Neurologic:  Normal speech and language. No gross focal neurologic deficits are appreciated.  Skin:  Skin is warm and dry. Abrasion to the left lateral  face.   ____________________________________________  RADIOLOGY  Ct Head Wo Contrast  Result Date: 09/14/2018 CLINICAL DATA:  Nasal and left periorbital lacerations and bleeding following a fall. Intoxicated. EXAM: CT HEAD WITHOUT CONTRAST CT MAXILLOFACIAL WITHOUT CONTRAST CT CERVICAL SPINE WITHOUT CONTRAST TECHNIQUE: Multidetector CT imaging of the head, cervical spine, and maxillofacial structures were performed using the standard protocol without intravenous contrast. Multiplanar CT image reconstructions of the  cervical spine and maxillofacial structures were also generated. COMPARISON:  Head and cervical spine CT dated 06/18/2018. FINDINGS: CT HEAD FINDINGS Brain: Interval mild diffuse enlargement of the subarachnoid spaces and ventricles. No intracranial hemorrhage, mass lesion or CT evidence of acute infarction. Vascular: No hyperdense vessel or unexpected calcification. Skull: Normal. Negative for fracture or focal lesion. Other: None. CT MAXILLOFACIAL FINDINGS Osseous: The distal portion of the nasal bone remains unfused to the proximal portion, unchanged since 06/18/2018. No acute fractures are seen. Orbits: Negative. No traumatic or inflammatory finding. Sinuses: Clear. Soft tissues: Unremarkable. CT CERVICAL SPINE FINDINGS Alignment: Straightening of the normal cervical lordosis. Minimal levoconvex scoliosis. No subluxations. Skull base and vertebrae: No acute fracture. No primary bone lesion or focal pathologic process. Soft tissues and spinal canal: No prevertebral fluid or swelling. No visible canal hematoma. Disc levels: Vacuum phenomenon in the C3-4 disc with mild-to-moderate bilateral uncinate spur formation. Minimal bilateral uncinate spur formation at the C4-5 level with minimal anterior spur formation at that level. Upper chest: Clear lung apices. Other: None. IMPRESSION: 1. No skull fracture or intracranial hemorrhage. 2. No maxillofacial fracture. 3. No cervical spine fracture or subluxation. 4. Interval mild diffuse cerebral and cerebellar atrophy. 5. Mild cervical spine degenerative changes. Electronically Signed   By: Beckie SaltsSteven  Reid M.D.   On: 09/14/2018 20:18   Ct Cervical Spine Wo Contrast  Result Date: 09/14/2018 CLINICAL DATA:  Nasal and left periorbital lacerations and bleeding following a fall. Intoxicated. EXAM: CT HEAD WITHOUT CONTRAST CT MAXILLOFACIAL WITHOUT CONTRAST CT CERVICAL SPINE WITHOUT CONTRAST TECHNIQUE: Multidetector CT imaging of the head, cervical spine, and maxillofacial  structures were performed using the standard protocol without intravenous contrast. Multiplanar CT image reconstructions of the cervical spine and maxillofacial structures were also generated. COMPARISON:  Head and cervical spine CT dated 06/18/2018. FINDINGS: CT HEAD FINDINGS Brain: Interval mild diffuse enlargement of the subarachnoid spaces and ventricles. No intracranial hemorrhage, mass lesion or CT evidence of acute infarction. Vascular: No hyperdense vessel or unexpected calcification. Skull: Normal. Negative for fracture or focal lesion. Other: None. CT MAXILLOFACIAL FINDINGS Osseous: The distal portion of the nasal bone remains unfused to the proximal portion, unchanged since 06/18/2018. No acute fractures are seen. Orbits: Negative. No traumatic or inflammatory finding. Sinuses: Clear. Soft tissues: Unremarkable. CT CERVICAL SPINE FINDINGS Alignment: Straightening of the normal cervical lordosis. Minimal levoconvex scoliosis. No subluxations. Skull base and vertebrae: No acute fracture. No primary bone lesion or focal pathologic process. Soft tissues and spinal canal: No prevertebral fluid or swelling. No visible canal hematoma. Disc levels: Vacuum phenomenon in the C3-4 disc with mild-to-moderate bilateral uncinate spur formation. Minimal bilateral uncinate spur formation at the C4-5 level with minimal anterior spur formation at that level. Upper chest: Clear lung apices. Other: None. IMPRESSION: 1. No skull fracture or intracranial hemorrhage. 2. No maxillofacial fracture. 3. No cervical spine fracture or subluxation. 4. Interval mild diffuse cerebral and cerebellar atrophy. 5. Mild cervical spine degenerative changes. Electronically Signed   By: Beckie SaltsSteven  Reid M.D.   On: 09/14/2018 20:18   Ct Maxillofacial Wo Contrast  Result  Date: 09/14/2018 CLINICAL DATA:  Nasal and left periorbital lacerations and bleeding following a fall. Intoxicated. EXAM: CT HEAD WITHOUT CONTRAST CT MAXILLOFACIAL WITHOUT  CONTRAST CT CERVICAL SPINE WITHOUT CONTRAST TECHNIQUE: Multidetector CT imaging of the head, cervical spine, and maxillofacial structures were performed using the standard protocol without intravenous contrast. Multiplanar CT image reconstructions of the cervical spine and maxillofacial structures were also generated. COMPARISON:  Head and cervical spine CT dated 06/18/2018. FINDINGS: CT HEAD FINDINGS Brain: Interval mild diffuse enlargement of the subarachnoid spaces and ventricles. No intracranial hemorrhage, mass lesion or CT evidence of acute infarction. Vascular: No hyperdense vessel or unexpected calcification. Skull: Normal. Negative for fracture or focal lesion. Other: None. CT MAXILLOFACIAL FINDINGS Osseous: The distal portion of the nasal bone remains unfused to the proximal portion, unchanged since 06/18/2018. No acute fractures are seen. Orbits: Negative. No traumatic or inflammatory finding. Sinuses: Clear. Soft tissues: Unremarkable. CT CERVICAL SPINE FINDINGS Alignment: Straightening of the normal cervical lordosis. Minimal levoconvex scoliosis. No subluxations. Skull base and vertebrae: No acute fracture. No primary bone lesion or focal pathologic process. Soft tissues and spinal canal: No prevertebral fluid or swelling. No visible canal hematoma. Disc levels: Vacuum phenomenon in the C3-4 disc with mild-to-moderate bilateral uncinate spur formation. Minimal bilateral uncinate spur formation at the C4-5 level with minimal anterior spur formation at that level. Upper chest: Clear lung apices. Other: None. IMPRESSION: 1. No skull fracture or intracranial hemorrhage. 2. No maxillofacial fracture. 3. No cervical spine fracture or subluxation. 4. Interval mild diffuse cerebral and cerebellar atrophy. 5. Mild cervical spine degenerative changes. Electronically Signed   By: Beckie Salts M.D.   On: 09/14/2018 20:18    ____________________________________________   PROCEDURES  Procedure(s) performed:     Procedures  None ____________________________________________   INITIAL IMPRESSION / ASSESSMENT AND PLAN / ED COURSE  Pertinent labs & imaging results that were available during my care of the patient were reviewed by me and considered in my medical decision making (see chart for details).  Patient arrives by EMS after fall.  She appears intoxicated and reports drinking alcohol today.  She has mostly abrasion to the left face.  There is an area of deeper, linear abrasion to the left lateral face.  No clear laceration.  Will clean and assess for need for stitches.  Patient is calling for a sober driver.  Given her intoxication plan for CT imaging of the head, face, cervical spine.  No apparent injuries or stigmata of seizure on my exam.  CT imaging reviewed. Patient sobering clinically and has a ride home from the ED who arrived and escorted the patient from the ED. Face is abrasion without laceration requiring repair. I cleaned and dressed the abrasion. Discussed ED return precautions.  ____________________________________________  FINAL CLINICAL IMPRESSION(S) / ED DIAGNOSES  Final diagnoses:  Injury of head, initial encounter  Abrasion of face, initial encounter  Strain of neck muscle, initial encounter  Contusion of face, initial encounter   Note:  This document was prepared using Dragon voice recognition software and may include unintentional dictation errors.  Alona Bene, MD Emergency Medicine    Rebeccah Ivins, Arlyss Repress, MD 09/15/18 [phone]

## 2018-10-15 ENCOUNTER — Ambulatory Visit (HOSPITAL_COMMUNITY)
Admission: RE | Admit: 2018-10-15 | Discharge: 2018-10-15 | Disposition: A | Discharge status: Home / Self Care | Payer: Federal, State, Local not specified - Other | Attending: Anesthesiology | Admitting: Anesthesiology

## 2018-10-15 DIAGNOSIS — F1020 Alcohol dependence, uncomplicated: Secondary | ICD-10-CM | POA: Diagnosis present

## 2018-10-15 DIAGNOSIS — F17200 Nicotine dependence, unspecified, uncomplicated: Secondary | ICD-10-CM | POA: Diagnosis not present

## 2018-10-15 DIAGNOSIS — F10239 Alcohol dependence with withdrawal, unspecified: Secondary | ICD-10-CM | POA: Insufficient documentation

## 2018-10-15 NOTE — BH Assessment (Signed)
Assessment Note  Sheila Graves is an 38 y.o. female presents to [facility] voluntarily. Pt reports alcohol abuse and needs detox. Pt reports she drink half a pint every couple of days reporting last drink 10/14/18. Pt denies SI currently or at any time in the past. Pt denies any history of suicide attempts and denies history of self-mutilation. Pt denies homicidal thoughts or physical aggression. Pt denies having access to firearms. Pt denies having any legal problems at this time. Pt denies hallucinations. Pt does not appear to be responding to internal stimuli and exhibits no delusional thought. Pt's reality testing appears to be intact. Pt denies homicidal thoughts or physical aggression. Pt denies having access to firearms. Pt denies having any legal problems at this time.    Pt is dressed in street clothes, alert, oriented x4 with normal speech and normal motor behavior. Eye contact is good and Pt is pleasant. Pt's mood is depressed and affect is anxious. Thought process is coherent and relevant. Pt's insight is poor and judgement is fair. There is no indication Pt is currently responding to internal stimuli or experiencing delusional thought content. Pt was cooperative throughout assessment.      Diagnosis:  F10.20 Alcohol use disorder, Severe  Past Medical History:  Past Medical History:  Diagnosis Date  . Alcohol withdrawal seizure (HCC)   . ETOH abuse   . Hypokalemia   . Pancreatitis   . Thyroid disease     Past Surgical History:  Procedure Laterality Date  . FINGER SURGERY      Family History: No family history on file.  Social History:  reports that she has been smoking. She has been smoking about 1.00 pack per day. She has never used smokeless tobacco. She reports current alcohol use. She reports current drug use.  Additional Social History:  Alcohol / Drug Use Pain Medications: See MAR Prescriptions: See MAR Over the Counter: See MAR History of alcohol / drug use?: Yes Longest  period of sobriety (when/how long): none reported Negative Consequences of Use: Financial, Personal relationships Substance #1 Name of Substance 1: Etoh 1 - Age of First Use: 18 1 - Amount (size/oz): 1/2 pint 1 - Frequency: every few days 1 - Duration: ongoing 1 - Last Use / Amount: 10/15/18  CIWA: CIWA-Ar BP: (!) 127/91 Pulse Rate: 99 COWS:    Allergies: No Known Allergies  Home Medications: (Not in a hospital admission)   OB/GYN Status:  No LMP recorded.  General Assessment Data Location of Assessment: [facility] Assessment Services TTS Assessment: In system Is this a Tele or Face-to-Face Assessment?: Face-to-Face Is this an Initial Assessment or a Re-assessment for this encounter?: Initial Assessment Patient Accompanied by:: Adult Permission Given to speak with another: No Language Other than English: No What gender do you identify as?: Female Marital status: Long term relationship Pregnancy Status: No Living Arrangements: Spouse/significant other Can pt return to current living arrangement?: Yes Admission Status: Voluntary Is patient capable of signing voluntary admission?: Yes Referral Source: Self/Family/Friend Insurance type: Self Pay  Medical Screening Exam [facility] Walk-in ONLY) Medical Exam completed: Yes  Crisis Care Plan Living Arrangements: Spouse/significant other Name of Psychiatrist: None Name of Therapist: None  Education Status Is patient currently in school?: No Is the patient employed, unemployed or receiving disability?: Unemployed  Risk to self with the past 6 months Suicidal Ideation: No Has patient been a risk to self within the past 6 months prior to admission? : No Suicidal Intent: No Has patient had any suicidal intent  within the past 6 months prior to admission? : No Is patient at risk for suicide?: No Suicidal Plan?: No Has patient had any suicidal plan within the past 6 months prior to admission? : No Access to Means: No Previous  Attempts/Gestures: No Intentional Self Injurious Behavior: None Family Suicide History: No Recent stressful life event(s): Job Loss, Financial Problems(SA) Persecutory voices/beliefs?: No Depression: Yes Depression Symptoms: Feeling worthless/self pity Substance abuse history and/or treatment for substance abuse?: Yes Suicide prevention information given to non-admitted patients: Not applicable  Risk to Others within the past 6 months Homicidal Ideation: No Does patient have any lifetime risk of violence toward others beyond the six months prior to admission? : No Thoughts of Harm to Others: No Current Homicidal Intent: No Current Homicidal Plan: No Access to Homicidal Means: No History of harm to others?: No Assessment of Violence: None Noted Does patient have access to weapons?: No Criminal Charges Pending?: No Does patient have a court date: No Is patient on probation?: No  Psychosis Hallucinations: None noted Delusions: None noted  Mental Status Report Appearance/Hygiene: Unremarkable Eye Contact: Good Motor Activity: Freedom of movement Speech: Logical/coherent Level of Consciousness: Quiet/awake Mood: Depressed Affect: Anxious Anxiety Level: Minimal Thought Processes: Coherent, Relevant Judgement: Partial Orientation: Person, Place, Time, Situation, Appropriate for developmental age Obsessive Compulsive Thoughts/Behaviors: None  Cognitive Functioning Concentration: Normal Memory: Recent Intact Is patient IDD: No Insight: Poor Impulse Control: Poor Appetite: Good Have you had any weight changes? : No Change Sleep: No Change Total Hours of Sleep: 7 Vegetative Symptoms: None  ADLScreening [facility] Assessment Services) Patient's cognitive ability adequate to safely complete daily activities?: Yes Patient able to express need for assistance with ADLs?: Yes Independently performs ADLs?: Yes (appropriate for developmental age)  Prior Inpatient Therapy Prior  Inpatient Therapy: Yes Prior Therapy Dates: 2020 Prior Therapy Facilty/Provider(s): [facility] Reason for Treatment: SA  Prior Outpatient Therapy Prior Outpatient Therapy: No Does patient have an ACCT team?: No Does patient have Intensive In-House Services?  : No Does patient have [facility] services? : No Does patient have P4CC services?: No  ADL Screening (condition at time of admission) Patient's cognitive ability adequate to safely complete daily activities?: Yes Is the patient deaf or have difficulty hearing?: No Does the patient have difficulty seeing, even when wearing glasses/contacts?: No Does the patient have difficulty concentrating, remembering, or making decisions?: No Patient able to express need for assistance with ADLs?: Yes Does the patient have difficulty dressing or bathing?: No Independently performs ADLs?: Yes (appropriate for developmental age) Does the patient have difficulty walking or climbing stairs?: No Weakness of Legs: None Weakness of Arms/Hands: None  Home Assistive Devices/Equipment Home Assistive Devices/Equipment: None  Therapy Consults (therapy consults require a physician order) PT Evaluation Needed: No OT Evalulation Needed: No SLP Evaluation Needed: No Abuse/Neglect Assessment (Assessment to be complete while patient is alone) Abuse/Neglect Assessment Can Be Completed: Yes Physical Abuse: Denies Verbal Abuse: Denies Sexual Abuse: Denies Exploitation of patient/patient's resources: Denies Self-Neglect: Denies Values / Beliefs Cultural Requests During Hospitalization: None Spiritual Requests During Hospitalization: None Consults Spiritual Care Consult Needed: No Social Work Consult Needed: No Merchant navy officer (For Healthcare) Does Patient Have a Medical Advance Directive?: No Would patient like information on creating a medical advance directive?: No - Patient declined          Disposition:  Disposition Initial Assessment Completed  for this Encounter: Yes Disposition of Patient: Discharge   Per Reola Calkins, NP pt does not meet inpatient criteria. Pt referred  to RTS.  On Site Evaluation by:   Reviewed with Physician:    Danae OrleansVanessa  Mckinsey Keagle, MA, [facility] 10/15/2018 6:48 PM

## 2018-10-15 NOTE — H&P (Signed)
Behavioral Health Medical Screening Exam  Sheila Graves is an 38 y.o. female.  Total Time spent with patient: 20 minutes  Psychiatric Specialty Exam: Physical Exam  Nursing note and vitals reviewed. Constitutional: She is oriented to person, place, and time. She appears well-developed and well-nourished.  Cardiovascular: Normal rate.  Respiratory: Effort normal.  Musculoskeletal: Normal range of motion.  Neurological: She is alert and oriented to person, place, and time.  Skin: Skin is warm.    Review of Systems  Constitutional: Negative.   HENT: Negative.   Eyes: Negative.   Respiratory: Negative.   Cardiovascular: Negative.   Gastrointestinal: Negative.   Genitourinary: Negative.   Musculoskeletal: Negative.   Skin: Negative.   Neurological: Negative.   Endo/Heme/Allergies: Negative.   Psychiatric/Behavioral: Positive for substance abuse. Negative for suicidal ideas.    Blood pressure (!) 127/91, pulse 99, temperature 98.4 F (36.9 C), temperature source Oral, resp. rate 16, SpO2 99 %.There is no height or weight on file to calculate BMI.  General Appearance: Casual  Eye Contact:  Good  Speech:  Clear and Coherent and Normal Rate  Volume:  Normal  Mood:  Euthymic  Affect:  Congruent  Thought Process:  Coherent and Descriptions of Associations: Intact  Orientation:  Full (Time, Place, and Person)  Thought Content:  WDL  Suicidal Thoughts:  No  Homicidal Thoughts:  No  Memory:  Immediate;   Good Recent;   Good Remote;   Good  Judgement:  Fair  Insight:  Fair  Psychomotor Activity:  Normal  Concentration: Concentration: Good and Attention Span: Good  Recall:  Good  Fund of Knowledge:Good  Language: Good  Akathisia:  No  Handed:  Right  AIMS (if indicated):     Assets:  Communication Skills Desire for Improvement Housing Social Support Transportation  Sleep:       Musculoskeletal: Strength & Muscle Tone: within normal limits Gait & Station:  normal Patient leans: N/A  Blood pressure (!) 127/91, pulse 99, temperature 98.4 F (36.9 C), temperature source Oral, resp. rate 16, SpO2 99 %.  Recommendations:  Based on my evaluation the patient does not appear to have an emergency medical condition.  Sheila Graves Sheila Palinkas, FNP 10/15/2018, 6:15 PM

## 2018-11-11 ENCOUNTER — Other Ambulatory Visit: Payer: Self-pay

## 2018-11-11 ENCOUNTER — Inpatient Hospital Stay (HOSPITAL_COMMUNITY)
Admission: EM | Admit: 2018-11-11 | Discharge: 2018-11-13 | DRG: 517 | Disposition: A | Discharge status: Home / Self Care | Payer: Self-pay | Attending: Student | Admitting: Student

## 2018-11-11 ENCOUNTER — Emergency Department (HOSPITAL_COMMUNITY): Payer: Self-pay

## 2018-11-11 ENCOUNTER — Encounter (HOSPITAL_COMMUNITY): Payer: Self-pay

## 2018-11-11 DIAGNOSIS — S42001B Fracture of unspecified part of right clavicle, initial encounter for open fracture: Secondary | ICD-10-CM | POA: Diagnosis present

## 2018-11-11 DIAGNOSIS — S42031B Displaced fracture of lateral end of right clavicle, initial encounter for open fracture: Principal | ICD-10-CM

## 2018-11-11 DIAGNOSIS — M25511 Pain in right shoulder: Secondary | ICD-10-CM

## 2018-11-11 DIAGNOSIS — F10120 Alcohol abuse with intoxication, uncomplicated: Secondary | ICD-10-CM | POA: Diagnosis present

## 2018-11-11 DIAGNOSIS — W010XXA Fall on same level from slipping, tripping and stumbling without subsequent striking against object, initial encounter: Secondary | ICD-10-CM | POA: Diagnosis present

## 2018-11-11 DIAGNOSIS — S42001A Fracture of unspecified part of right clavicle, initial encounter for closed fracture: Secondary | ICD-10-CM

## 2018-11-11 DIAGNOSIS — S21211A Laceration without foreign body of right back wall of thorax without penetration into thoracic cavity, initial encounter: Secondary | ICD-10-CM

## 2018-11-11 DIAGNOSIS — Y92009 Unspecified place in unspecified non-institutional (private) residence as the place of occurrence of the external cause: Secondary | ICD-10-CM

## 2018-11-11 DIAGNOSIS — Z419 Encounter for procedure for purposes other than remedying health state, unspecified: Secondary | ICD-10-CM

## 2018-11-11 DIAGNOSIS — F10920 Alcohol use, unspecified with intoxication, uncomplicated: Secondary | ICD-10-CM

## 2018-11-11 DIAGNOSIS — Z1159 Encounter for screening for other viral diseases: Secondary | ICD-10-CM

## 2018-11-11 DIAGNOSIS — W19XXXA Unspecified fall, initial encounter: Secondary | ICD-10-CM

## 2018-11-11 DIAGNOSIS — F17210 Nicotine dependence, cigarettes, uncomplicated: Secondary | ICD-10-CM | POA: Diagnosis present

## 2018-11-11 DIAGNOSIS — Y93K1 Activity, walking an animal: Secondary | ICD-10-CM

## 2018-11-11 LAB — URINALYSIS, ROUTINE W REFLEX MICROSCOPIC
Bilirubin Urine: NEGATIVE
Glucose, UA: NEGATIVE mg/dL
Hgb urine dipstick: NEGATIVE
Ketones, ur: NEGATIVE mg/dL
Leukocytes,Ua: NEGATIVE
Nitrite: NEGATIVE
Protein, ur: NEGATIVE mg/dL
Specific Gravity, Urine: 1.002 — ABNORMAL LOW (ref 1.005–1.030)
pH: 8 (ref 5.0–8.0)

## 2018-11-11 LAB — COMPREHENSIVE METABOLIC PANEL
ALT: 21 U/L (ref 0–44)
AST: 42 U/L — ABNORMAL HIGH (ref 15–41)
Albumin: 3.3 g/dL — ABNORMAL LOW (ref 3.5–5.0)
Alkaline Phosphatase: 47 U/L (ref 38–126)
Anion gap: 9 (ref 5–15)
BUN: 13 mg/dL (ref 6–20)
CO2: 24 mmol/L (ref 22–32)
Calcium: 8.1 mg/dL — ABNORMAL LOW (ref 8.9–10.3)
Chloride: 106 mmol/L (ref 98–111)
Creatinine, Ser: 0.82 mg/dL (ref 0.44–1.00)
GFR calc Af Amer: >60 mL/min (ref >60)
GFR calc non Af Amer: >60 mL/min (ref >60)
Glucose, Bld: 86 mg/dL (ref 70–99)
Potassium: 4.6 mmol/L (ref 3.5–5.1)
Sodium: 139 mmol/L (ref 135–145)
Total Bilirubin: 1.1 mg/dL (ref 0.3–1.2)
Total Protein: 5.2 g/dL — ABNORMAL LOW (ref 6.5–8.1)

## 2018-11-11 LAB — CBC WITH DIFFERENTIAL/PLATELET
Abs Immature Granulocytes: 0.01 10*3/uL (ref 0.00–0.07)
Basophils Absolute: 0.1 10*3/uL (ref 0.0–0.1)
Basophils Relative: 2 %
Eosinophils Absolute: 0 10*3/uL (ref 0.0–0.5)
Eosinophils Relative: 0 %
HCT: 36.9 % (ref 36.0–46.0)
Hemoglobin: 12.2 g/dL (ref 12.0–15.0)
Immature Granulocytes: 0 %
Lymphocytes Relative: 34 %
Lymphs Abs: 1.7 10*3/uL (ref 0.7–4.0)
MCH: 33.8 pg (ref 26.0–34.0)
MCHC: 33.1 g/dL (ref 30.0–36.0)
MCV: 102.2 fL — ABNORMAL HIGH (ref 80.0–100.0)
Monocytes Absolute: 0.4 10*3/uL (ref 0.1–1.0)
Monocytes Relative: 9 %
Neutro Abs: 2.7 10*3/uL (ref 1.7–7.7)
Neutrophils Relative %: 55 %
Platelets: 155 10*3/uL (ref 150–400)
RBC: 3.61 MIL/uL — ABNORMAL LOW (ref 3.87–5.11)
RDW: 13.9 % (ref 11.5–15.5)
WBC: 4.8 10*3/uL (ref 4.0–10.5)
nRBC: 0 % (ref 0.0–0.2)

## 2018-11-11 LAB — RAPID URINE DRUG SCREEN, HOSP PERFORMED
Amphetamines: NOT DETECTED
Barbiturates: NOT DETECTED
Benzodiazepines: NOT DETECTED
Cocaine: NOT DETECTED
Opiates: NOT DETECTED
Tetrahydrocannabinol: NOT DETECTED

## 2018-11-11 LAB — ETHANOL: Alcohol, Ethyl (B): 330 mg/dL (ref <10)

## 2018-11-11 LAB — I-STAT BETA HCG BLOOD, ED (MC, WL, AP ONLY): I-stat hCG, quantitative: <5 m[IU]/mL (ref <5)

## 2018-11-11 LAB — SARS CORONAVIRUS 2 BY RT PCR (HOSPITAL ORDER, PERFORMED IN ~~LOC~~ HOSPITAL LAB): SARS Coronavirus 2: NEGATIVE

## 2018-11-11 MED ORDER — CEFAZOLIN SODIUM-DEXTROSE 2-4 GM/100ML-% IV SOLN
2.0000 g | Freq: Once | INTRAVENOUS | Status: AC
  Administered 2018-11-11: 2 g via INTRAVENOUS
  Filled 2018-11-11: qty 100

## 2018-11-11 MED ORDER — HYDROXYZINE HCL 25 MG PO TABS
25.0000 mg | ORAL_TABLET | Freq: Four times a day (QID) | ORAL | Status: DC | PRN
  Administered 2018-11-11: 25 mg via ORAL
  Filled 2018-11-11: qty 1

## 2018-11-11 MED ORDER — MORPHINE SULFATE (PF) 2 MG/ML IV SOLN
2.0000 mg | INTRAVENOUS | Status: DC | PRN
  Administered 2018-11-12: 2 mg via INTRAVENOUS
  Filled 2018-11-11: qty 1

## 2018-11-11 MED ORDER — ACETAMINOPHEN 325 MG PO TABS
650.0000 mg | ORAL_TABLET | Freq: Four times a day (QID) | ORAL | Status: DC
  Administered 2018-11-11 – 2018-11-13 (×5): 650 mg via ORAL
  Filled 2018-11-11 (×5): qty 2

## 2018-11-11 MED ORDER — LORAZEPAM 2 MG/ML IJ SOLN
1.0000 mg | Freq: Once | INTRAMUSCULAR | Status: AC
  Administered 2018-11-11: 1 mg via INTRAVENOUS
  Filled 2018-11-11: qty 1

## 2018-11-11 MED ORDER — ADULT MULTIVITAMIN W/MINERALS CH
1.0000 | ORAL_TABLET | Freq: Every day | ORAL | Status: DC
  Administered 2018-11-13: 1 via ORAL
  Filled 2018-11-11: qty 1

## 2018-11-11 MED ORDER — CEFAZOLIN SODIUM-DEXTROSE 2-4 GM/100ML-% IV SOLN
2.0000 g | Freq: Three times a day (TID) | INTRAVENOUS | Status: DC
  Administered 2018-11-11 – 2018-11-12 (×3): 2 g via INTRAVENOUS
  Filled 2018-11-11 (×4): qty 100

## 2018-11-11 MED ORDER — TRAZODONE HCL 50 MG PO TABS
50.0000 mg | ORAL_TABLET | Freq: Every evening | ORAL | Status: DC | PRN
  Administered 2018-11-12: 50 mg via ORAL
  Filled 2018-11-11: qty 1

## 2018-11-11 MED ORDER — VITAMIN B-1 100 MG PO TABS
100.0000 mg | ORAL_TABLET | Freq: Every day | ORAL | Status: DC
  Administered 2018-11-13: 100 mg via ORAL
  Filled 2018-11-11: qty 1

## 2018-11-11 MED ORDER — LORAZEPAM 2 MG/ML IJ SOLN: 1.0000 mg | Freq: Four times a day (QID) | INTRAMUSCULAR | Status: DC | PRN

## 2018-11-11 MED ORDER — SODIUM CHLORIDE 0.9 % IV BOLUS
1000.0000 mL | Freq: Once | INTRAVENOUS | Status: AC
  Administered 2018-11-11: 1000 mL via INTRAVENOUS

## 2018-11-11 MED ORDER — LORAZEPAM 1 MG PO TABS: 1.0000 mg | ORAL_TABLET | Freq: Four times a day (QID) | ORAL | Status: DC | PRN

## 2018-11-11 MED ORDER — TETANUS-DIPHTH-ACELL PERTUSSIS 5-2.5-18.5 LF-MCG/0.5 IM SUSP
0.5000 mL | Freq: Once | INTRAMUSCULAR | Status: AC
  Administered 2018-11-11: 0.5 mL via INTRAMUSCULAR
  Filled 2018-11-11: qty 0.5

## 2018-11-11 MED ORDER — OXYCODONE HCL 5 MG PO TABS
5.0000 mg | ORAL_TABLET | ORAL | Status: DC | PRN
  Administered 2018-11-12 – 2018-11-13 (×3): 10 mg via ORAL
  Filled 2018-11-11 (×4): qty 2

## 2018-11-11 MED ORDER — THIAMINE HCL 100 MG/ML IJ SOLN: 100.0000 mg | Freq: Every day | INTRAMUSCULAR | Status: DC

## 2018-11-11 MED ORDER — FOLIC ACID 1 MG PO TABS
1.0000 mg | ORAL_TABLET | Freq: Every day | ORAL | Status: DC
  Administered 2018-11-13: 1 mg via ORAL
  Filled 2018-11-11: qty 1

## 2018-11-11 NOTE — H&P (Signed)
See consult note for full H&P       Sheila Graves A. Ladonna Snide [facility] ?[phone]? (phone)

## 2018-11-11 NOTE — ED Notes (Signed)
Pts fiance: [name] [phone] or [phone] Please call for further updates  This Rn just called to update the fiance on plan of care and admission, [name] appreciative of update.

## 2018-11-11 NOTE — H&P (View-Only) (Signed)
Orthopaedic Trauma Service (OTS) Consult   Patient ID: Sheila Graves MRN: [id number] DOB/AGE: Mar 23, 1981 38 y.o.  Reason for Consult: Right clavicle fracture Referring Physician: ED provider  HPI: Sheila Graves is an 38 y.o. female being seen in consultation of Sheila Graves ED. Patient is somnolent throughout exam due to intoxication. According to ED notes, patient had been drinking alcohol earlier today and went out to walk her dog. The patient said her dog saw another dog and took off running.  She then tripped and fell. She denies loss of consciousness. Presented to ED with complaints of severe right shoulder pain. Imaging in the ED revealed right clavicle fracture. There is a small wound on her posterior right shoulder which was concerning for open fracture. Orthopaedic trauma service was consulted. I was unable to obtained any additional history from the patient  Past Medical History:  Diagnosis Date  . Alcohol withdrawal seizure (HCC)   . ETOH abuse   . Hypokalemia   . Pancreatitis   . Thyroid disease     Past Surgical History:  Procedure Laterality Date  . FINGER SURGERY      No family history on file.  Social History:  reports that she has been smoking. She has been smoking about 1.00 pack per day. She has never used smokeless tobacco. She reports current alcohol use. She reports current drug use.  Allergies: No Known Allergies  Medications: I have reviewed the patient's current medications.  ROS: Constitutional: No fever or chills Vision: No changes in vision ENT: No difficulty swallowing CV: No chest pain Pulm: No SOB or wheezing GI: No nausea or vomiting GU: No urgency or inability to hold urine Skin: +wound posterior right shoulder Neurologic: No numbness or tingling Psychiatric: No depression or anxiety Heme: No bruising Allergic: No reaction to medications or food   Exam: Blood pressure 105/75, pulse 86, temperature 97.6 F (36.4 C), temperature source Oral,  resp. rate 18, SpO2 100 %. General: Laying in bed, eyes closed, NAD. Does not participate in much of exam Orientation: Extremely drowsy and difficult to arouse but oriented to person Mood and Affect: Mood normal Gait: Not assessed  Right Upper Extremity: Bruising present with small wound with bloody drainage on posterior aspect of shoulder. Unable to assess depth of wound with Q-tip. Tenderness about the shoulder and with palpation of clavicle. Has decreased ROM of the shoulder. Full passive elbow and wrist ROM without obvious discomfort or pain. Neurovascularly intact  Left Upper Extremity: Skin without lesions. No tenderness to palpation. Full painless ROM, full strength in each muscle group without evidence of instability. Neurovascularly intact.  Left Lower Extremity: Skin without lesions. No tenderness to palpation. Full painless ROM, full strength in each muscle group without evidence of instability. Neurovascularly intact.  Right Lower Extremity: Skin without lesions. No tenderness to palpation. Full painless ROM, full strength in each muscle group without evidence of instability. Neurovascularly intact.  Medical Decision Making: Imaging: Two views of right shoulder show displaced fracture involving the lateral right clavicle. Fracture may extend to the right AC joint. No associated dislocation  Labs:  Results for orders placed or performed during the hospital encounter of 11/11/18 (from the past 24 hour(s))  Comprehensive metabolic panel     Status: Abnormal   Collection Time: 11/11/18  1:39 PM  Result Value Ref Range   Sodium 139 135 - 145 mmol/L   Potassium 4.6 3.5 - 5.1 mmol/L   Chloride 106 98 - 111 mmol/L   CO2 24 22 -  32 mmol/L   Glucose, Bld 86 70 - 99 mg/dL   BUN 13 6 - 20 mg/dL   Creatinine, Ser 1.74 0.44 - 1.00 mg/dL   Calcium 8.1 (L) 8.9 - 10.3 mg/dL   Total Protein 5.2 (L) 6.5 - 8.1 g/dL   Albumin 3.3 (L) 3.5 - 5.0 g/dL   AST 42 (H) 15 - 41 U/L   ALT 21 0 - 44 U/L    Alkaline Phosphatase 47 38 - 126 U/L   Total Bilirubin 1.1 0.3 - 1.2 mg/dL   GFR calc non Af Amer >60 >60 mL/min   GFR calc Af Amer >60 >60 mL/min   Anion gap 9 5 - 15  CBC with Differential     Status: Abnormal   Collection Time: 11/11/18  1:39 PM  Result Value Ref Range   WBC 4.8 4.0 - 10.5 K/uL   RBC 3.61 (L) 3.87 - 5.11 MIL/uL   Hemoglobin 12.2 12.0 - 15.0 g/dL   HCT 08.1 44.8 - 18.5 %   MCV 102.2 (H) 80.0 - 100.0 fL   MCH 33.8 26.0 - 34.0 pg   MCHC 33.1 30.0 - 36.0 g/dL   RDW 63.1 49.7 - 02.6 %   Platelets 155 150 - 400 K/uL   nRBC 0.0 0.0 - 0.2 %   Neutrophils Relative % 55 %   Neutro Abs 2.7 1.7 - 7.7 K/uL   Lymphocytes Relative 34 %   Lymphs Abs 1.7 0.7 - 4.0 K/uL   Monocytes Relative 9 %   Monocytes Absolute 0.4 0.1 - 1.0 K/uL   Eosinophils Relative 0 %   Eosinophils Absolute 0.0 0.0 - 0.5 K/uL   Basophils Relative 2 %   Basophils Absolute 0.1 0.0 - 0.1 K/uL   Immature Granulocytes 0 %   Abs Immature Granulocytes 0.01 0.00 - 0.07 K/uL  I-Stat beta hCG blood, ED     Status: None   Collection Time: 11/11/18  1:53 PM  Result Value Ref Range   I-stat hCG, quantitative <5.0 <5 mIU/mL   Comment 3          Urinalysis, Routine w reflex microscopic     Status: Abnormal   Collection Time: 11/11/18  3:26 PM  Result Value Ref Range   Color, Urine COLORLESS (A) YELLOW   APPearance CLEAR CLEAR   Specific Gravity, Urine 1.002 (L) 1.005 - 1.030   pH 8.0 5.0 - 8.0   Glucose, UA NEGATIVE NEGATIVE mg/dL   Hgb urine dipstick NEGATIVE NEGATIVE   Bilirubin Urine NEGATIVE NEGATIVE   Ketones, ur NEGATIVE NEGATIVE mg/dL   Protein, ur NEGATIVE NEGATIVE mg/dL   Nitrite NEGATIVE NEGATIVE   Leukocytes,Ua NEGATIVE NEGATIVE  Urine rapid drug screen (hosp performed)     Status: None   Collection Time: 11/11/18  3:26 PM  Result Value Ref Range   Opiates NONE DETECTED NONE DETECTED   Cocaine NONE DETECTED NONE DETECTED   Benzodiazepines NONE DETECTED NONE DETECTED   Amphetamines NONE  DETECTED NONE DETECTED   Tetrahydrocannabinol NONE DETECTED NONE DETECTED   Barbiturates NONE DETECTED NONE DETECTED    Medical history and chart was reviewed  Assessment/Plan: 38 year old female s/p fall which resulted in right clavicle fracture  Will go ahead and admit patient to orthopaedic trauma service and allow her to sober up some overnight. Will plan for irrigation debridement and open reduction internal fixation of the right clavicle tomorrow morning by Dr. Jena Gauss. Will start her on 2gm Ancef this evening. She will be  NPO past midnight. Patient intoxicated this evening and unable to consent for surgery, will obtain this in the morning.   Zephyr Sausedo A. Ladonna SnideYacobi, PA-C [facility] ?[phone]? (phone)

## 2018-11-11 NOTE — ED Provider Notes (Signed)
Pt care assumed at 1500.  Pt here for evaluation of injuries following a fall.  She is intoxicated on exam.  There is a punctate wound to the right posterior shoulder with persistent oozing with underlying clavicle fracture.  D/w Dr. Jena Gauss with Orthopedics - concern for possible open fracture.  She was treated with Ancef and Orthopedics plan to admit for further mgmt.     Tilden Fossa, MD 11/11/18 2317

## 2018-11-11 NOTE — ED Notes (Signed)
Patient transported to CT 

## 2018-11-11 NOTE — ED Triage Notes (Signed)
Pt bib ems for ground level fall while intoxicated. Pt was walking her dog and the dog took off running. Denies LOC, skin tear to right shoulder, injury to right shoulder. c collar in place. Pt alert. BP 97/65 pulse 81

## 2018-11-11 NOTE — Consult Note (Signed)
Orthopaedic Trauma Service (OTS) Consult   Patient ID: Sheila Graves MRN: [id number] DOB/AGE: 03/05/1981 37 y.o.  Reason for Consult: Right clavicle fracture Referring Physician: ED provider  HPI: Sheila Graves is an 37 y.o. female being seen in consultation of [facility]. Patient is somnolent throughout exam due to intoxication. According to ED notes, patient had been drinking alcohol earlier today and went out to walk her dog. The patient said her dog saw another dog and took off running.  She then tripped and fell. She denies loss of consciousness. Presented to ED with complaints of severe right shoulder pain. Imaging in the ED revealed right clavicle fracture. There is a small wound on her posterior right shoulder which was concerning for open fracture. Orthopaedic trauma service was consulted. I was unable to obtained any additional history from the patient  Past Medical History:  Diagnosis Date  . Alcohol withdrawal seizure (HCC)   . ETOH abuse   . Hypokalemia   . Pancreatitis   . Thyroid disease     Past Surgical History:  Procedure Laterality Date  . FINGER SURGERY      No family history on file.  Social History:  reports that she has been smoking. She has been smoking about 1.00 pack per day. She has never used smokeless tobacco. She reports current alcohol use. She reports current drug use.  Allergies: No Known Allergies  Medications: I have reviewed the patient's current medications.  ROS: Constitutional: No fever or chills Vision: No changes in vision ENT: No difficulty swallowing CV: No chest pain Pulm: No SOB or wheezing GI: No nausea or vomiting GU: No urgency or inability to hold urine Skin: +wound posterior right shoulder Neurologic: No numbness or tingling Psychiatric: No depression or anxiety Heme: No bruising Allergic: No reaction to medications or food   Exam: Blood pressure 105/75, pulse 86, temperature 97.6 F (36.4 C), temperature source Oral,  resp. rate 18, SpO2 100 %. General: Laying in bed, eyes closed, NAD. Does not participate in much of exam Orientation: Extremely drowsy and difficult to arouse but oriented to person Mood and Affect: Mood normal Gait: Not assessed  Right Upper Extremity: Bruising present with small wound with bloody drainage on posterior aspect of shoulder. Unable to assess depth of wound with Q-tip. Tenderness about the shoulder and with palpation of clavicle. Has decreased ROM of the shoulder. Full passive elbow and wrist ROM without obvious discomfort or pain. Neurovascularly intact  Left Upper Extremity: Skin without lesions. No tenderness to palpation. Full painless ROM, full strength in each muscle group without evidence of instability. Neurovascularly intact.  Left Lower Extremity: Skin without lesions. No tenderness to palpation. Full painless ROM, full strength in each muscle group without evidence of instability. Neurovascularly intact.  Right Lower Extremity: Skin without lesions. No tenderness to palpation. Full painless ROM, full strength in each muscle group without evidence of instability. Neurovascularly intact.  Medical Decision Making: Imaging: Two views of right shoulder show displaced fracture involving the lateral right clavicle. Fracture may extend to the right AC joint. No associated dislocation  Labs:  Results for orders placed or performed during the hospital encounter of 11/11/18 (from the past 24 hour(s))  Comprehensive metabolic panel     Status: Abnormal   Collection Time: 11/11/18  1:39 PM  Result Value Ref Range   Sodium 139 135 - 145 mmol/L   Potassium 4.6 3.5 - 5.1 mmol/L   Chloride 106 98 - 111 mmol/L   CO2 24 22 -   32 mmol/L   Glucose, Bld 86 70 - 99 mg/dL   BUN 13 6 - 20 mg/dL   Creatinine, Ser 1.74 0.44 - 1.00 mg/dL   Calcium 8.1 (L) 8.9 - 10.3 mg/dL   Total Protein 5.2 (L) 6.5 - 8.1 g/dL   Albumin 3.3 (L) 3.5 - 5.0 g/dL   AST 42 (H) 15 - 41 U/L   ALT 21 0 - 44 U/L    Alkaline Phosphatase 47 38 - 126 U/L   Total Bilirubin 1.1 0.3 - 1.2 mg/dL   GFR calc non Af Amer >60 >60 mL/min   GFR calc Af Amer >60 >60 mL/min   Anion gap 9 5 - 15  CBC with Differential     Status: Abnormal   Collection Time: 11/11/18  1:39 PM  Result Value Ref Range   WBC 4.8 4.0 - 10.5 K/uL   RBC 3.61 (L) 3.87 - 5.11 MIL/uL   Hemoglobin 12.2 12.0 - 15.0 g/dL   HCT 08.1 44.8 - 18.5 %   MCV 102.2 (H) 80.0 - 100.0 fL   MCH 33.8 26.0 - 34.0 pg   MCHC 33.1 30.0 - 36.0 g/dL   RDW 63.1 49.7 - 02.6 %   Platelets 155 150 - 400 K/uL   nRBC 0.0 0.0 - 0.2 %   Neutrophils Relative % 55 %   Neutro Abs 2.7 1.7 - 7.7 K/uL   Lymphocytes Relative 34 %   Lymphs Abs 1.7 0.7 - 4.0 K/uL   Monocytes Relative 9 %   Monocytes Absolute 0.4 0.1 - 1.0 K/uL   Eosinophils Relative 0 %   Eosinophils Absolute 0.0 0.0 - 0.5 K/uL   Basophils Relative 2 %   Basophils Absolute 0.1 0.0 - 0.1 K/uL   Immature Granulocytes 0 %   Abs Immature Granulocytes 0.01 0.00 - 0.07 K/uL  I-Stat beta hCG blood, ED     Status: None   Collection Time: 11/11/18  1:53 PM  Result Value Ref Range   I-stat hCG, quantitative <5.0 <5 mIU/mL   Comment 3          Urinalysis, Routine w reflex microscopic     Status: Abnormal   Collection Time: 11/11/18  3:26 PM  Result Value Ref Range   Color, Urine COLORLESS (A) YELLOW   APPearance CLEAR CLEAR   Specific Gravity, Urine 1.002 (L) 1.005 - 1.030   pH 8.0 5.0 - 8.0   Glucose, UA NEGATIVE NEGATIVE mg/dL   Hgb urine dipstick NEGATIVE NEGATIVE   Bilirubin Urine NEGATIVE NEGATIVE   Ketones, ur NEGATIVE NEGATIVE mg/dL   Protein, ur NEGATIVE NEGATIVE mg/dL   Nitrite NEGATIVE NEGATIVE   Leukocytes,Ua NEGATIVE NEGATIVE  Urine rapid drug screen (hosp performed)     Status: None   Collection Time: 11/11/18  3:26 PM  Result Value Ref Range   Opiates NONE DETECTED NONE DETECTED   Cocaine NONE DETECTED NONE DETECTED   Benzodiazepines NONE DETECTED NONE DETECTED   Amphetamines NONE  DETECTED NONE DETECTED   Tetrahydrocannabinol NONE DETECTED NONE DETECTED   Barbiturates NONE DETECTED NONE DETECTED    Medical history and chart was reviewed  Assessment/Plan: 38 year old female s/p fall which resulted in right clavicle fracture  Will go ahead and admit patient to orthopaedic trauma service and allow her to sober up some overnight. Will plan for irrigation debridement and open reduction internal fixation of the right clavicle tomorrow morning by Dr. Jena Gauss. Will start her on 2gm Ancef this evening. She will be  NPO past midnight. Patient intoxicated this evening and unable to consent for surgery, will obtain this in the morning.   Zarian Colpitts A. Ladonna SnideYacobi, PA-C [facility] ?[phone]? (phone)

## 2018-11-11 NOTE — Progress Notes (Signed)
Pt arrived from ED. No respiratory distress. No c/o pain. Puncture wound reinforced. Will continue to monitor.

## 2018-11-11 NOTE — ED Notes (Signed)
Pt incontinent of urine twice during her stay, purewick applied

## 2018-11-11 NOTE — ED Notes (Signed)
ED TO INPATIENT HANDOFF REPORT  ED Nurse Name and Phone #: Naimah Yingst [phone]  S Name/Age/Gender Sheila Graves 38 y.o. female Room/Bed: 035C/035C  Code Status   Code Status: Prior  Home/SNF/Other Home Patient oriented to: self Is this baseline? No   Triage Complete: Triage complete  Chief Complaint ground level fall  Triage Note Pt bib ems for ground level fall while intoxicated. Pt was walking her dog and the dog took off running. Denies LOC, skin tear to right shoulder, injury to right shoulder. c collar in place. Pt alert. BP 97/65 pulse 81   Allergies No Known Allergies  Level of Care/Admitting Diagnosis ED Disposition    ED Disposition Condition Comment   Admit  Hospital Area: [facility] [[id number]]  Level of Care: Med-Surg [16]  Covid Evaluation: Screening Protocol (No Symptoms)  Diagnosis: Fracture of clavicle, right, open [454098]  Admitting Physician: Roby Lofts [[id number]]  Attending Physician: Roby Lofts [[id number]]  Estimated length of stay: past midnight tomorrow  Certification:: I certify this patient will need inpatient services for at least 2 midnights  PT Class (Do Not Modify): Inpatient [101]  PT Acc Code (Do Not Modify): Private [1]       B Medical/Surgery History Past Medical History:  Diagnosis Date  . Alcohol withdrawal seizure (HCC)   . ETOH abuse   . Hypokalemia   . Pancreatitis   . Thyroid disease    Past Surgical History:  Procedure Laterality Date  . FINGER SURGERY       A IV Location/Drains/Wounds Patient Lines/Drains/Airways Status   Active Line/Drains/Airways    Name:   Placement date:   Placement time:   Site:   Days:   Peripheral IV 11/11/18 Left Forearm   11/11/18    1342    Forearm   less than 1          Intake/Output Last 24 hours No intake or output data in the 24 hours ending 11/11/18 1813  Labs/Imaging Results for orders placed or performed during the hospital encounter of 11/11/18 (from the  past 48 hour(s))  Comprehensive metabolic panel     Status: Abnormal   Collection Time: 11/11/18  1:39 PM  Result Value Ref Range   Sodium 139 135 - 145 mmol/L   Potassium 4.6 3.5 - 5.1 mmol/L    Comment: SLIGHT HEMOLYSIS   Chloride 106 98 - 111 mmol/L   CO2 24 22 - 32 mmol/L   Glucose, Bld 86 70 - 99 mg/dL   BUN 13 6 - 20 mg/dL   Creatinine, Ser 1.30 0.44 - 1.00 mg/dL   Calcium 8.1 (L) 8.9 - 10.3 mg/dL   Total Protein 5.2 (L) 6.5 - 8.1 g/dL   Albumin 3.3 (L) 3.5 - 5.0 g/dL   AST 42 (H) 15 - 41 U/L   ALT 21 0 - 44 U/L   Alkaline Phosphatase 47 38 - 126 U/L   Total Bilirubin 1.1 0.3 - 1.2 mg/dL   GFR calc non Af Amer >60 >60 mL/min   GFR calc Af Amer >60 >60 mL/min   Anion gap 9 5 - 15    Comment: Performed at [facility], [address], [place], [place] [address]  CBC with Differential     Status: Abnormal   Collection Time: 11/11/18  1:39 PM  Result Value Ref Range   WBC 4.8 4.0 - 10.5 K/uL   RBC 3.61 (L) 3.87 - 5.11 MIL/uL   Hemoglobin 12.2 12.0 -  15.0 g/dL   HCT 16.1 09.6 - 04.5 %   MCV 102.2 (H) 80.0 - 100.0 fL   MCH 33.8 26.0 - 34.0 pg   MCHC 33.1 30.0 - 36.0 g/dL   RDW 40.9 81.1 - 91.4 %   Platelets 155 150 - 400 K/uL   nRBC 0.0 0.0 - 0.2 %   Neutrophils Relative % 55 %   Neutro Abs 2.7 1.7 - 7.7 K/uL   Lymphocytes Relative 34 %   Lymphs Abs 1.7 0.7 - 4.0 K/uL   Monocytes Relative 9 %   Monocytes Absolute 0.4 0.1 - 1.0 K/uL   Eosinophils Relative 0 %   Eosinophils Absolute 0.0 0.0 - 0.5 K/uL   Basophils Relative 2 %   Basophils Absolute 0.1 0.0 - 0.1 K/uL   Immature Granulocytes 0 %   Abs Immature Granulocytes 0.01 0.00 - 0.07 K/uL    Comment: Performed at [facility], [address], [place], [place] [address]  I-Stat beta hCG blood, ED     Status: None   Collection Time: 11/11/18  1:53 PM  Result Value Ref Range   I-stat hCG, quantitative <5.0 <5 mIU/mL   Comment 3            Comment:   GEST. AGE      CONC.  (mIU/mL)   <=1 WEEK        5 -  50     2 WEEKS       50 - 500     3 WEEKS       100 - 10,000     4 WEEKS     1,000 - 30,000        FEMALE AND NON-PREGNANT FEMALE:     LESS THAN 5 mIU/mL   Urinalysis, Routine w reflex microscopic     Status: Abnormal   Collection Time: 11/11/18  3:26 PM  Result Value Ref Range   Color, Urine COLORLESS (A) YELLOW   APPearance CLEAR CLEAR   Specific Gravity, Urine 1.002 (L) 1.005 - 1.030   pH 8.0 5.0 - 8.0   Glucose, UA NEGATIVE NEGATIVE mg/dL   Hgb urine dipstick NEGATIVE NEGATIVE   Bilirubin Urine NEGATIVE NEGATIVE   Ketones, ur NEGATIVE NEGATIVE mg/dL   Protein, ur NEGATIVE NEGATIVE mg/dL   Nitrite NEGATIVE NEGATIVE   Leukocytes,Ua NEGATIVE NEGATIVE    Comment: Performed at [facility], [address], [place], [place] [address]  Urine rapid drug screen (hosp performed)     Status: None   Collection Time: 11/11/18  3:26 PM  Result Value Ref Range   Opiates NONE DETECTED NONE DETECTED   Cocaine NONE DETECTED NONE DETECTED   Benzodiazepines NONE DETECTED NONE DETECTED   Amphetamines NONE DETECTED NONE DETECTED   Tetrahydrocannabinol NONE DETECTED NONE DETECTED   Barbiturates NONE DETECTED NONE DETECTED    Comment: (NOTE) DRUG SCREEN FOR MEDICAL PURPOSES ONLY.  IF CONFIRMATION IS NEEDED FOR ANY PURPOSE, NOTIFY LAB WITHIN 5 DAYS. LOWEST DETECTABLE LIMITS FOR URINE DRUG SCREEN Drug Class                     Cutoff (ng/mL) Amphetamine and metabolites    1000 Barbiturate and metabolites    200 Benzodiazepine                 200 Tricyclics and metabolites     300 Opiates and metabolites        300 Cocaine and metabolites  300 THC                            50 Performed at [facility], [address] [place], [place] [address]   SARS Coronavirus 2 (CEPHEID - Performed in [facility] hospital lab), Hosp Order     Status: None   Collection Time: 11/11/18  4:52 PM  Result Value Ref Range   SARS Coronavirus 2 NEGATIVE NEGATIVE    Comment: (NOTE) If  result is NEGATIVE SARS-CoV-2 target nucleic acids are NOT DETECTED. The SARS-CoV-2 RNA is generally detectable in upper and lower  respiratory specimens during the acute phase of infection. The lowest  concentration of SARS-CoV-2 viral copies this assay can detect is 250  copies / mL. A negative result does not preclude SARS-CoV-2 infection  and should not be used as the sole basis for treatment or other  patient management decisions.  A negative result may occur with  improper specimen collection / handling, submission of specimen other  than nasopharyngeal swab, presence of viral mutation(s) within the  areas targeted by this assay, and inadequate number of viral copies  (<250 copies / mL). A negative result must be combined with clinical  observations, patient history, and epidemiological information. If result is POSITIVE SARS-CoV-2 target nucleic acids are DETECTED. The SARS-CoV-2 RNA is generally detectable in upper and lower  respiratory specimens dur ing the acute phase of infection.  Positive  results are indicative of active infection with SARS-CoV-2.  Clinical  correlation with patient history and other diagnostic information is  necessary to determine patient infection status.  Positive results do  not rule out bacterial infection or co-infection with other viruses. If result is PRESUMPTIVE POSTIVE SARS-CoV-2 nucleic acids MAY BE PRESENT.   A presumptive positive result was obtained on the submitted specimen  and confirmed on repeat testing.  While 2019 novel coronavirus  (SARS-CoV-2) nucleic acids may be present in the submitted sample  additional confirmatory testing may be necessary for epidemiological  and / or clinical management purposes  to differentiate between  SARS-CoV-2 and other Sarbecovirus currently known to infect humans.  If clinically indicated additional testing with an alternate test  methodology [phone]) is advised. The SARS-CoV-2 RNA is generally   detectable in upper and lower respiratory sp ecimens during the acute  phase of infection. The expected result is Negative. Fact Sheet for Patients:  BoilerBrush.com.cy Fact Sheet for Healthcare Providers: https://pope.com/ This test is not yet approved or cleared by the [place] FDA and has been authorized for detection and/or diagnosis of SARS-CoV-2 by FDA under an Emergency Use Authorization (EUA).  This EUA will remain in effect (meaning this test can be used) for the duration of the COVID-19 declaration under Section 564(b)(1) of the Act, 21 U.S.C. section 360bbb-3(b)(1), unless the authorization is terminated or revoked sooner. Performed at [facility], [address] [place], [place] [address]    Dg Chest 1 View  Result Date: 11/11/2018 CLINICAL DATA:  Status post fall today.  Initial encounter. EXAM: CHEST  1 VIEW COMPARISON:  Single-view of the chest 06/18/2018. FINDINGS: Lungs clear. Heart size normal. No pneumothorax or pleural fluid. No bony abnormality. IMPRESSION: Negative chest. Electronically Signed   By: Drusilla Kanner M.D.   On: 11/11/2018 16:02   Dg Pelvis 1-2 Views  Result Date: 11/11/2018 CLINICAL DATA:  Recent fall with pelvic pain, initial encounter EXAM: PELVIS - 1-2 VIEW COMPARISON:  07/19/2017 FINDINGS: Pelvic ring is intact. No acute fracture is noted. No gross soft tissue abnormality is seen. IMPRESSION: No acute abnormality noted. Electronically Signed   By: Alcide Clever M.D.   On: 11/11/2018 15:17   Dg Shoulder Right  Result Date: 11/11/2018 CLINICAL DATA:  Fall.  Skin tear to right shoulder. EXAM: RIGHT SHOULDER - 2+ VIEW COMPARISON:  Chest radiograph 11/11/2018 FINDINGS: Displaced fracture involving the lateral right clavicle. Fracture may extend to the right AC joint. Right shoulder is located. Negative for a large right pneumothorax. Visualized right ribs are intact. IMPRESSION: Right  clavicle fracture. Electronically Signed   By: Richarda Overlie M.D.   On: 11/11/2018 16:05   Ct Head Wo Contrast  Result Date: 11/11/2018 CLINICAL DATA:  Ground level fall while intoxicated. EXAM: CT HEAD WITHOUT CONTRAST CT CERVICAL SPINE WITHOUT CONTRAST TECHNIQUE: Multidetector CT imaging of the head and cervical spine was performed following the standard protocol without intravenous contrast. Multiplanar CT image reconstructions of the cervical spine were also generated. COMPARISON:  CT head and cervical spine dated September 14, 2018. FINDINGS: CT HEAD FINDINGS Brain: No evidence of acute infarction, hemorrhage, hydrocephalus, extra-axial collection or mass lesion/mass effect. Vascular: No hyperdense vessel or unexpected calcification. Skull: Normal. Negative for fracture or focal lesion. Sinuses/Orbits: No acute finding. Other: None. CT CERVICAL SPINE FINDINGS Alignment: Normal. Skull base and vertebrae: No acute fracture. No primary bone lesion or focal pathologic process. Soft tissues and spinal canal: No prevertebral fluid or swelling. No visible canal hematoma. Disc levels: Disc heights are relatively preserved. Unchanged moderate right and mild left uncovertebral hypertrophy at C3-C4. Upper chest: Negative. Other: None. IMPRESSION: 1.  No acute intracranial abnormality. 2.  No acute cervical spine fracture. Electronically Signed   By: Obie Dredge M.D.   On: 11/11/2018 15:04   Ct Cervical Spine Wo Contrast  Result Date: 11/11/2018 CLINICAL DATA:  Ground level fall while intoxicated. EXAM: CT HEAD WITHOUT CONTRAST CT CERVICAL SPINE WITHOUT CONTRAST TECHNIQUE: Multidetector CT imaging of the head and cervical spine was performed following the standard protocol without intravenous contrast. Multiplanar CT image reconstructions of the cervical spine were also generated. COMPARISON:  CT head and cervical spine dated September 14, 2018. FINDINGS: CT HEAD FINDINGS Brain: No evidence of acute infarction,  hemorrhage, hydrocephalus, extra-axial collection or mass lesion/mass effect. Vascular: No hyperdense vessel or unexpected calcification. Skull: Normal. Negative for fracture or focal lesion. Sinuses/Orbits: No acute finding. Other: None. CT CERVICAL SPINE FINDINGS Alignment: Normal. Skull base and vertebrae: No acute fracture. No primary bone lesion or focal pathologic process. Soft tissues and spinal canal: No prevertebral fluid or swelling. No visible canal hematoma. Disc levels: Disc heights are relatively preserved. Unchanged moderate right and mild left uncovertebral hypertrophy at C3-C4. Upper chest: Negative. Other: None. IMPRESSION: 1.  No acute intracranial abnormality. 2.  No acute cervical spine fracture. Electronically Signed   By: Obie Dredge M.D.   On: 11/11/2018 15:04    Pending Labs Unresulted Labs (From admission, onward)    Start     Ordered   11/11/18 1325  Ethanol  ONCE - STAT,   STAT     11/11/18 1325          Vitals/Pain Today's Vitals   11/11/18 1400 11/11/18 1623 11/11/18 1630 11/11/18 1730  BP: 95/63 105/75 104/69 99/68  Pulse: 79 86 82 83  Resp: 18 18 (!) 23 18  Temp:      TempSrc:      SpO2: 97% 100%  99% 100%    Isolation Precautions No active isolations  Medications Medications  acetaminophen (TYLENOL) tablet 650 mg (has no administration in time range)  oxyCODONE (Oxy IR/ROXICODONE) immediate release tablet 5-10 mg (has no administration in time range)  morphine 2 MG/ML injection 2 mg (has no administration in time range)  ceFAZolin (ANCEF) IVPB 2g/100 mL premix (has no administration in time range)  hydrOXYzine (ATARAX/VISTARIL) tablet 25 mg (has no administration in time range)  traZODone (DESYREL) tablet 50 mg (has no administration in time range)  LORazepam (ATIVAN) tablet 1 mg (has no administration in time range)    Or  LORazepam (ATIVAN) injection 1 mg (has no administration in time range)  thiamine (VITAMIN B-1) tablet 100 mg (has no  administration in time range)    Or  thiamine (B-1) injection 100 mg (has no administration in time range)  folic acid (FOLVITE) tablet 1 mg (has no administration in time range)  multivitamin with minerals tablet 1 tablet (has no administration in time range)  sodium chloride 0.9 % bolus 1,000 mL (0 mLs Intravenous Stopped 11/11/18 1503)  Tdap (BOOSTRIX) injection 0.5 mL (0.5 mLs Intramuscular Given 11/11/18 1413)  LORazepam (ATIVAN) injection 1 mg (1 mg Intravenous Given 11/11/18 1510)  ceFAZolin (ANCEF) IVPB 2g/100 mL premix (0 g Intravenous Stopped 11/11/18 1731)    Mobility walks High fall risk   Focused Assessments Neuro Assessment Handoff:  Swallow screen pass? n/a         Neuro Assessment:   Neuro Checks:      Last Documented NIHSS Modified Score:   Has TPA been given? No If patient is a Neuro Trauma and patient is going to OR before floor call report to 4N Charge nurse: [phone] or [phone]     R Recommendations: See Admitting Provider Note  Report given to:   Additional Notes:  Surgery tomorrow for open right clavicle fracture.

## 2018-11-11 NOTE — Progress Notes (Signed)
Orthopedic Tech Progress Note Patient Details:  Sheila Graves 03/08/1981 [id number]  Ortho Devices Type of Ortho Device: Shoulder immobilizer Ortho Device/Splint Location: URE Ortho Device/Splint Interventions: Adjustment, Ordered, Application   Post Interventions Patient Tolerated: Well Instructions Provided: Care of device, Adjustment of device   Donald Pore 11/11/2018, 6:41 PM

## 2018-11-11 NOTE — ED Provider Notes (Signed)
[facility] EMERGENCY DEPARTMENT Provider Note   CSN: [id number] Arrival date & time: 11/11/18  1317    History   Chief Complaint Chief Complaint  Patient presents with   Fall   Alcohol Intoxication    HPI Sheila Graves is a 38 y.o. female.     Pt presents to the ED today with fall and right shoulder pain.  The pt has been drinking alcohol and went out to walk her dog.  The pt said her dog saw another dog and took off running.  She then tripped and fell.  She denies loc.  She c/o right shoulder pain.  Initial sbp in the 80s.  EMS gave her 1L NS en route.         Past Medical History:  Diagnosis Date   Alcohol withdrawal seizure (HCC)    ETOH abuse    Hypokalemia    Pancreatitis    Thyroid disease     Patient Active Problem List   Diagnosis Date Noted   Alcohol dependence with withdrawal (HCC) 01/31/2018   Major depressive disorder, recurrent, severe without psychotic features (HCC) 01/31/2018    Past Surgical History:  Procedure Laterality Date   FINGER SURGERY       OB History   No obstetric history on file.      Home Medications    Prior to Admission medications   Medication Sig Start Date End Date Taking? Authorizing Provider  acamprosate (CAMPRAL) 333 MG tablet Take 2 tablets (666 mg total) by mouth 3 (three) times daily with meals. 02/02/18   Denzil Magnuson, NP  hydrOXYzine (ATARAX/VISTARIL) 25 MG tablet Take 1 tablet (25 mg total) by mouth every 6 (six) hours as needed (anxiety/agitation or CIWA < or = 10). 02/02/18   Denzil Magnuson, NP  potassium chloride 20 MEQ TBCR Take 20 mEq by mouth daily. 09/10/16   Audry Pili, PA-C  thiamine 100 MG tablet Take 1 tablet (100 mg total) by mouth daily. 02/03/18   Denzil Magnuson, NP  traZODone (DESYREL) 50 MG tablet Take 1 tablet (50 mg total) by mouth at bedtime as needed for sleep. 02/02/18   Denzil Magnuson, NP    Family History No family history on file.  Social History Social  History   Tobacco Use   Smoking status: Current Every Day Smoker    Packs/day: 1.00   Smokeless tobacco: Never Used  Substance Use Topics   Alcohol use: Yes    Comment: states that she drinks a couple drinks 2-3 times weekly   Drug use: Yes    Comment: Heroin: Daily      Allergies   Patient has no known allergies.   Review of Systems Review of Systems  Musculoskeletal:       Right shoulder pain  Skin: Positive for wound.  All other systems reviewed and are negative.    Physical Exam Updated Vital Signs BP 95/63    Pulse 79    Temp 97.6 F (36.4 C) (Oral)    Resp 18    SpO2 97%   Physical Exam Vitals signs and nursing note reviewed.  Constitutional:      Appearance: Normal appearance.  HENT:     Head: Normocephalic and atraumatic.     Right Ear: External ear normal.     Left Ear: External ear normal.     Nose: Nose normal.     Mouth/Throat:     Mouth: Mucous membranes are moist.     Pharynx: Oropharynx is  clear.  Eyes:     Extraocular Movements: Extraocular movements intact.     Conjunctiva/sclera: Conjunctivae normal.     Pupils: Pupils are equal, round, and reactive to light.  Neck:     Comments: Pt in c-collar Cardiovascular:     Rate and Rhythm: Normal rate and regular rhythm.     Pulses: Normal pulses.     Heart sounds: Normal heart sounds.  Pulmonary:     Effort: Pulmonary effort is normal.     Breath sounds: Normal breath sounds.  Abdominal:     General: Abdomen is flat. Bowel sounds are normal.     Palpations: Abdomen is soft.  Musculoskeletal:     Right shoulder: She exhibits decreased range of motion and tenderness.       Arms:  Skin:    General: Skin is warm.     Capillary Refill: Capillary refill takes less than 2 seconds.  Neurological:     General: No focal deficit present.     Mental Status: She is alert and oriented to person, place, and time.  Psychiatric:        Mood and Affect: Mood normal.        Behavior: Behavior normal.         Thought Content: Thought content normal.        Judgment: Judgment normal.      ED Treatments / Results  Labs (all labs ordered are listed, but only abnormal results are displayed) Labs Reviewed  COMPREHENSIVE METABOLIC PANEL - Abnormal; Notable for the following components:      Result Value   Calcium 8.1 (*)    Total Protein 5.2 (*)    Albumin 3.3 (*)    AST 42 (*)    All other components within normal limits  CBC WITH DIFFERENTIAL/PLATELET - Abnormal; Notable for the following components:   RBC 3.61 (*)    MCV 102.2 (*)    All other components within normal limits  URINALYSIS, ROUTINE W REFLEX MICROSCOPIC  RAPID URINE DRUG SCREEN, HOSP PERFORMED  ETHANOL  I-STAT BETA HCG BLOOD, ED ([facility], [facility], [facility] ONLY)    EKG None  Radiology Dg Pelvis 1-2 Views  Result Date: 11/11/2018 CLINICAL DATA:  Recent fall with pelvic pain, initial encounter EXAM: PELVIS - 1-2 VIEW COMPARISON:  07/19/2017 FINDINGS: Pelvic ring is intact. No acute fracture is noted. No gross soft tissue abnormality is seen. IMPRESSION: No acute abnormality noted. Electronically Signed   By: Alcide CleverMark  Lukens M.D.   On: 11/11/2018 15:17   Ct Head Wo Contrast  Result Date: 11/11/2018 CLINICAL DATA:  Ground level fall while intoxicated. EXAM: CT HEAD WITHOUT CONTRAST CT CERVICAL SPINE WITHOUT CONTRAST TECHNIQUE: Multidetector CT imaging of the head and cervical spine was performed following the standard protocol without intravenous contrast. Multiplanar CT image reconstructions of the cervical spine were also generated. COMPARISON:  CT head and cervical spine dated September 14, 2018. FINDINGS: CT HEAD FINDINGS Brain: No evidence of acute infarction, hemorrhage, hydrocephalus, extra-axial collection or mass lesion/mass effect. Vascular: No hyperdense vessel or unexpected calcification. Skull: Normal. Negative for fracture or focal lesion. Sinuses/Orbits: No acute finding. Other: None. CT CERVICAL SPINE FINDINGS Alignment:  Normal. Skull base and vertebrae: No acute fracture. No primary bone lesion or focal pathologic process. Soft tissues and spinal canal: No prevertebral fluid or swelling. No visible canal hematoma. Disc levels: Disc heights are relatively preserved. Unchanged moderate right and mild left uncovertebral hypertrophy at C3-C4. Upper chest: Negative. Other: None. IMPRESSION: 1.  No acute intracranial abnormality. 2.  No acute cervical spine fracture. Electronically Signed   By: Obie Dredge M.D.   On: 11/11/2018 15:04   Ct Cervical Spine Wo Contrast  Result Date: 11/11/2018 CLINICAL DATA:  Ground level fall while intoxicated. EXAM: CT HEAD WITHOUT CONTRAST CT CERVICAL SPINE WITHOUT CONTRAST TECHNIQUE: Multidetector CT imaging of the head and cervical spine was performed following the standard protocol without intravenous contrast. Multiplanar CT image reconstructions of the cervical spine were also generated. COMPARISON:  CT head and cervical spine dated September 14, 2018. FINDINGS: CT HEAD FINDINGS Brain: No evidence of acute infarction, hemorrhage, hydrocephalus, extra-axial collection or mass lesion/mass effect. Vascular: No hyperdense vessel or unexpected calcification. Skull: Normal. Negative for fracture or focal lesion. Sinuses/Orbits: No acute finding. Other: None. CT CERVICAL SPINE FINDINGS Alignment: Normal. Skull base and vertebrae: No acute fracture. No primary bone lesion or focal pathologic process. Soft tissues and spinal canal: No prevertebral fluid or swelling. No visible canal hematoma. Disc levels: Disc heights are relatively preserved. Unchanged moderate right and mild left uncovertebral hypertrophy at C3-C4. Upper chest: Negative. Other: None. IMPRESSION: 1.  No acute intracranial abnormality. 2.  No acute cervical spine fracture. Electronically Signed   By: Obie Dredge M.D.   On: 11/11/2018 15:04    Procedures Procedures (including critical care time)  Medications Ordered in  ED Medications  sodium chloride 0.9 % bolus 1,000 mL (0 mLs Intravenous Stopped 11/11/18 1503)  Tdap (BOOSTRIX) injection 0.5 mL (0.5 mLs Intramuscular Given 11/11/18 1413)  LORazepam (ATIVAN) injection 1 mg (1 mg Intravenous Given 11/11/18 1510)     Initial Impression / Assessment and Plan / ED Course  I have reviewed the triage vital signs and the nursing notes.  Pertinent labs & imaging results that were available during my care of the patient were reviewed by me and considered in my medical decision making (see chart for details).       Pt would not cooperate for her shoulder xrays, so she was given some ativan and xray will come back for her.  The ct of her head and neck was negative.  Pt signed out to Dr. Madilyn Hook at shift change.  Final Clinical Impressions(s) / ED Diagnoses   Final diagnoses:  Alcoholic intoxication without complication (HCC)  Fall in home, initial encounter  Acute pain of right shoulder  Laceration of right side of back, initial encounter    ED Discharge Orders    None       Jacalyn Lefevre, MD 11/11/18 1527

## 2018-11-11 NOTE — ED Notes (Signed)
This rn made multiple attempts to reach multiple family members for pt, unable to get in contact with anyone, will try again later

## 2018-11-11 NOTE — Progress Notes (Signed)
CRITICAL VALUE ALERT  Critical Value: alcohol level 330  Date & Time Notied:  11/11/18, 2126  Provider Notified: DR. Jena Gauss  Orders Received/Actions taken: no new orders

## 2018-11-12 ENCOUNTER — Inpatient Hospital Stay (HOSPITAL_COMMUNITY): Payer: Self-pay | Admitting: Certified Registered"

## 2018-11-12 ENCOUNTER — Inpatient Hospital Stay (HOSPITAL_COMMUNITY): Payer: Self-pay

## 2018-11-12 ENCOUNTER — Encounter (HOSPITAL_COMMUNITY): Payer: Self-pay | Admitting: Certified Registered"

## 2018-11-12 ENCOUNTER — Encounter (HOSPITAL_COMMUNITY)
Admission: EM | Disposition: A | Discharge status: Home / Self Care | Payer: Self-pay | Source: Home / Self Care | Attending: Student

## 2018-11-12 HISTORY — PX: ORIF CLAVICULAR FRACTURE: SHX5055

## 2018-11-12 HISTORY — PX: IRRIGATION AND DEBRIDEMENT SHOULDER: SHX5880

## 2018-11-12 LAB — SURGICAL PCR SCREEN
MRSA, PCR: POSITIVE — AB
Staphylococcus aureus: POSITIVE — AB

## 2018-11-12 SURGERY — OPEN REDUCTION INTERNAL FIXATION (ORIF) CLAVICULAR FRACTURE
Anesthesia: General | Site: Shoulder | Laterality: Right

## 2018-11-12 MED ORDER — CEFAZOLIN SODIUM-DEXTROSE 2-4 GM/100ML-% IV SOLN: 2.0000 g | INTRAVENOUS | Status: DC

## 2018-11-12 MED ORDER — POVIDONE-IODINE 10 % EX SWAB
2.0000 "application " | Freq: Once | CUTANEOUS | Status: AC
  Administered 2018-11-12: 2 via TOPICAL

## 2018-11-12 MED ORDER — BACITRACIN ZINC 500 UNIT/GM EX OINT
TOPICAL_OINTMENT | CUTANEOUS | Status: AC
  Filled 2018-11-12: qty 28.35

## 2018-11-12 MED ORDER — 0.9 % SODIUM CHLORIDE (POUR BTL) OPTIME
TOPICAL | Status: DC | PRN
  Administered 2018-11-12: 11:00:00 1000 mL

## 2018-11-12 MED ORDER — FENTANYL CITRATE (PF) 250 MCG/5ML IJ SOLN
INTRAMUSCULAR | Status: AC
  Filled 2018-11-12: qty 5

## 2018-11-12 MED ORDER — ONDANSETRON HCL 4 MG/2ML IJ SOLN: 4.0000 mg | Freq: Once | INTRAMUSCULAR | Status: DC | PRN

## 2018-11-12 MED ORDER — SUGAMMADEX SODIUM 200 MG/2ML IV SOLN
INTRAVENOUS | Status: DC | PRN
  Administered 2018-11-12: 200 mg via INTRAVENOUS

## 2018-11-12 MED ORDER — SUCCINYLCHOLINE CHLORIDE 200 MG/10ML IV SOSY
PREFILLED_SYRINGE | INTRAVENOUS | Status: DC | PRN
  Administered 2018-11-12: 100 mg via INTRAVENOUS

## 2018-11-12 MED ORDER — FENTANYL CITRATE (PF) 100 MCG/2ML IJ SOLN: 25.0000 ug | INTRAMUSCULAR | Status: DC | PRN

## 2018-11-12 MED ORDER — PROPOFOL 10 MG/ML IV BOLUS
INTRAVENOUS | Status: DC | PRN
  Administered 2018-11-12: 110 mg via INTRAVENOUS

## 2018-11-12 MED ORDER — ONDANSETRON HCL 4 MG/2ML IJ SOLN
INTRAMUSCULAR | Status: AC
  Filled 2018-11-12: qty 2

## 2018-11-12 MED ORDER — PROPOFOL 10 MG/ML IV BOLUS
INTRAVENOUS | Status: AC
  Filled 2018-11-12: qty 20

## 2018-11-12 MED ORDER — SUCCINYLCHOLINE CHLORIDE 200 MG/10ML IV SOSY
PREFILLED_SYRINGE | INTRAVENOUS | Status: AC
  Filled 2018-11-12: qty 10

## 2018-11-12 MED ORDER — FENTANYL CITRATE (PF) 100 MCG/2ML IJ SOLN
INTRAMUSCULAR | Status: DC | PRN
  Administered 2018-11-12 (×2): 50 ug via INTRAVENOUS

## 2018-11-12 MED ORDER — MIDAZOLAM HCL 2 MG/2ML IJ SOLN
INTRAMUSCULAR | Status: AC
  Filled 2018-11-12: qty 2

## 2018-11-12 MED ORDER — PHENYLEPHRINE 40 MCG/ML (10ML) SYRINGE FOR IV PUSH (FOR BLOOD PRESSURE SUPPORT)
PREFILLED_SYRINGE | INTRAVENOUS | Status: DC | PRN
  Administered 2018-11-12 (×2): 80 ug via INTRAVENOUS

## 2018-11-12 MED ORDER — FENTANYL CITRATE (PF) 100 MCG/2ML IJ SOLN
25.0000 ug | INTRAMUSCULAR | Status: DC | PRN
  Administered 2018-11-12: 25 ug via INTRAVENOUS

## 2018-11-12 MED ORDER — MUPIROCIN 2 % EX OINT
TOPICAL_OINTMENT | Freq: Two times a day (BID) | CUTANEOUS | Status: DC
  Administered 2018-11-12 – 2018-11-13 (×3): via NASAL
  Filled 2018-11-12: qty 22

## 2018-11-12 MED ORDER — OXYCODONE HCL 5 MG/5ML PO SOLN: 5.0000 mg | Freq: Once | ORAL | Status: DC | PRN

## 2018-11-12 MED ORDER — ROCURONIUM BROMIDE 10 MG/ML (PF) SYRINGE
PREFILLED_SYRINGE | INTRAVENOUS | Status: AC
  Filled 2018-11-12: qty 10

## 2018-11-12 MED ORDER — LIDOCAINE 2% (20 MG/ML) 5 ML SYRINGE
INTRAMUSCULAR | Status: AC
  Filled 2018-11-12: qty 5

## 2018-11-12 MED ORDER — ACETAMINOPHEN 500 MG PO TABS: 500.0000 mg | ORAL_TABLET | Freq: Two times a day (BID) | ORAL | 1 refills | Status: DC

## 2018-11-12 MED ORDER — ROCURONIUM BROMIDE 50 MG/5ML IV SOSY
PREFILLED_SYRINGE | INTRAVENOUS | Status: DC | PRN
  Administered 2018-11-12: 40 mg via INTRAVENOUS

## 2018-11-12 MED ORDER — FENTANYL CITRATE (PF) 100 MCG/2ML IJ SOLN
INTRAMUSCULAR | Status: AC
  Filled 2018-11-12: qty 2

## 2018-11-12 MED ORDER — LACTATED RINGERS IV SOLN
INTRAVENOUS | Status: DC
  Administered 2018-11-12: 08:00:00 via INTRAVENOUS

## 2018-11-12 MED ORDER — BUPIVACAINE-EPINEPHRINE 0.5% -1:200000 IJ SOLN
INTRAMUSCULAR | Status: DC | PRN
  Administered 2018-11-12: .000001 mL

## 2018-11-12 MED ORDER — CEFAZOLIN SODIUM 1 G IJ SOLR
INTRAMUSCULAR | Status: AC
  Filled 2018-11-12: qty 20

## 2018-11-12 MED ORDER — DEXAMETHASONE SODIUM PHOSPHATE 10 MG/ML IJ SOLN
INTRAMUSCULAR | Status: AC
  Filled 2018-11-12: qty 1

## 2018-11-12 MED ORDER — LACTATED RINGERS IV SOLN
INTRAVENOUS | Status: DC | PRN
  Administered 2018-11-12 (×2): via INTRAVENOUS

## 2018-11-12 MED ORDER — CEFAZOLIN SODIUM-DEXTROSE 2-4 GM/100ML-% IV SOLN
2.0000 g | Freq: Three times a day (TID) | INTRAVENOUS | Status: AC
  Administered 2018-11-12 – 2018-11-13 (×3): 2 g via INTRAVENOUS
  Filled 2018-11-12 (×3): qty 100

## 2018-11-12 MED ORDER — ENSURE PRE-SURGERY PO LIQD
296.0000 mL | Freq: Once | ORAL | Status: AC
  Administered 2018-11-12: 04:00:00 296 mL via ORAL
  Filled 2018-11-12: qty 296

## 2018-11-12 MED ORDER — ONDANSETRON HCL 4 MG/2ML IJ SOLN
INTRAMUSCULAR | Status: DC | PRN
  Administered 2018-11-12: 4 mg via INTRAVENOUS

## 2018-11-12 MED ORDER — VANCOMYCIN HCL 1000 MG IV SOLR
INTRAVENOUS | Status: DC | PRN
  Administered 2018-11-12: 1000 mg via TOPICAL

## 2018-11-12 MED ORDER — OXYCODONE HCL 5 MG PO TABS: 5.0000 mg | ORAL_TABLET | Freq: Once | ORAL | Status: DC | PRN

## 2018-11-12 MED ORDER — OXYCODONE HCL 5 MG PO TABS: 5.0000 mg | ORAL_TABLET | ORAL | 0 refills | Status: AC | PRN

## 2018-11-12 MED ORDER — PHENYLEPHRINE 40 MCG/ML (10ML) SYRINGE FOR IV PUSH (FOR BLOOD PRESSURE SUPPORT)
PREFILLED_SYRINGE | INTRAVENOUS | Status: AC
  Filled 2018-11-12: qty 10

## 2018-11-12 MED ORDER — ENSURE PRE-SURGERY PO LIQD
296.0000 mL | Freq: Once | ORAL | Status: DC
  Filled 2018-11-12: qty 296

## 2018-11-12 MED ORDER — LIDOCAINE 2% (20 MG/ML) 5 ML SYRINGE
INTRAMUSCULAR | Status: DC | PRN
  Administered 2018-11-12: 40 mg via INTRAVENOUS

## 2018-11-12 MED ORDER — VANCOMYCIN HCL 1000 MG IV SOLR
INTRAVENOUS | Status: AC
  Filled 2018-11-12: qty 1000

## 2018-11-12 MED ORDER — DEXAMETHASONE SODIUM PHOSPHATE 10 MG/ML IJ SOLN
INTRAMUSCULAR | Status: DC | PRN
  Administered 2018-11-12: 8 mg via INTRAVENOUS

## 2018-11-12 MED ORDER — CHLORHEXIDINE GLUCONATE 4 % EX LIQD
60.0000 mL | Freq: Once | CUTANEOUS | Status: AC
  Administered 2018-11-12: 4 via TOPICAL
  Filled 2018-11-12: qty 60

## 2018-11-12 MED ORDER — BUPIVACAINE-EPINEPHRINE (PF) 0.5% -1:200000 IJ SOLN
INTRAMUSCULAR | Status: AC
  Filled 2018-11-12: qty 30

## 2018-11-12 MED ORDER — SODIUM CHLORIDE 0.9 % IR SOLN
Status: DC | PRN
  Administered 2018-11-12: 3000 mL

## 2018-11-12 MED ORDER — MIDAZOLAM HCL 5 MG/5ML IJ SOLN
INTRAMUSCULAR | Status: DC | PRN
  Administered 2018-11-12: 2 mg via INTRAVENOUS

## 2018-11-12 SURGICAL SUPPLY — 48 items
BIT DRILL 2.5X110 QC LCP DISP (BIT) ×2 IMPLANT
BIT DRILL LCP QC 2X140 (BIT) ×2 IMPLANT
BRUSH SCRUB SURG 4.25 DISP (MISCELLANEOUS) ×8 IMPLANT
CHLORAPREP W/TINT 26ML (MISCELLANEOUS) ×4 IMPLANT
COVER SURGICAL LIGHT HANDLE (MISCELLANEOUS) ×6 IMPLANT
COVER WAND RF STERILE (DRAPES) ×4 IMPLANT
DRAPE C-ARM 42X72 X-RAY (DRAPES) ×4 IMPLANT
DRAPE INCISE IOBAN 66X45 STRL (DRAPES) ×4 IMPLANT
DRAPE U-SHAPE 47X51 STRL (DRAPES) ×6 IMPLANT
DRSG MEPILEX BORDER 4X8 (GAUZE/BANDAGES/DRESSINGS) ×4 IMPLANT
ELECT REM PT RETURN 9FT ADLT (ELECTROSURGICAL) ×4
ELECTRODE REM PT RTRN 9FT ADLT (ELECTROSURGICAL) ×2 IMPLANT
GLOVE BIO SURGEON STRL SZ 6.5 (GLOVE) ×9 IMPLANT
GLOVE BIO SURGEON STRL SZ7.5 (GLOVE) ×12 IMPLANT
GLOVE BIO SURGEONS STRL SZ 6.5 (GLOVE) ×3
GLOVE BIOGEL PI IND STRL 6.5 (GLOVE) ×2 IMPLANT
GLOVE BIOGEL PI IND STRL 7.5 (GLOVE) ×2 IMPLANT
GLOVE BIOGEL PI INDICATOR 6.5 (GLOVE) ×2
GLOVE BIOGEL PI INDICATOR 7.5 (GLOVE) ×2
GOWN STRL REUS W/ TWL LRG LVL3 (GOWN DISPOSABLE) ×4 IMPLANT
GOWN STRL REUS W/TWL LRG LVL3 (GOWN DISPOSABLE) ×6
KIT BASIN OR (CUSTOM PROCEDURE TRAY) ×4 IMPLANT
KIT TURNOVER KIT B (KITS) ×4 IMPLANT
MANIFOLD NEPTUNE II (INSTRUMENTS) ×4 IMPLANT
NS IRRIG 1000ML POUR BTL (IV SOLUTION) ×4 IMPLANT
PACK GENERAL/GYN (CUSTOM PROCEDURE TRAY) ×4 IMPLANT
PAD ARMBOARD 7.5X6 YLW CONV (MISCELLANEOUS) ×8 IMPLANT
PLATE CLAVICLE 3.5X81 4H RT (Plate) ×2 IMPLANT
SCREW CORTEX 3.5 14MM (Screw) ×6 IMPLANT
SCREW CORTEX 3.5 36MM (Screw) ×2 IMPLANT
SCREW LOCK CORT ST 3.5X14 (Screw) IMPLANT
SCREW LOCK CORT ST 3.5X36 (Screw) IMPLANT
SCREW LOCK ST 2.7X12 (Screw) ×4 IMPLANT
SCREW LOCK ST 2.7X14 (Screw) ×2 IMPLANT
SCREW LOCK ST 2.7X16 (Screw) ×6 IMPLANT
SCREW LOCK T8 12X2.7X2.1XST (Screw) IMPLANT
SCREW LOCK T8 14X2.7X2.1XST (Screw) IMPLANT
STAPLER VISISTAT 35W (STAPLE) ×4 IMPLANT
SUCTION FRAZIER HANDLE 10FR (MISCELLANEOUS) ×2
SUCTION TUBE FRAZIER 10FR DISP (MISCELLANEOUS) ×2 IMPLANT
SUT MNCRL AB 3-0 PS2 18 (SUTURE) ×4 IMPLANT
SUT MNCRL AB 3-0 PS2 27 (SUTURE) ×4 IMPLANT
SUT VIC AB 0 CT1 27 (SUTURE) ×2
SUT VIC AB 0 CT1 27XBRD ANBCTR (SUTURE) ×2 IMPLANT
SUT VIC AB 2-0 CT1 27 (SUTURE) ×2
SUT VIC AB 2-0 CT1 TAPERPNT 27 (SUTURE) ×2 IMPLANT
TOWEL OR 17X26 10 PK STRL BLUE (TOWEL DISPOSABLE) ×4 IMPLANT
WATER STERILE IRR 1000ML POUR (IV SOLUTION) ×4 IMPLANT

## 2018-11-12 NOTE — Progress Notes (Signed)
Pt's BP at this time 87/52, HR at 75. Notified MD on call. No new orders. Will monitor pt.

## 2018-11-12 NOTE — Anesthesia Preprocedure Evaluation (Addendum)
Anesthesia Evaluation  Patient identified by MRN, date of birth, ID band Patient awake    Reviewed: Allergy & Precautions, NPO status , Patient's Chart, lab work & pertinent test results  Airway Mallampati: II  TM Distance: >3 FB Neck ROM: Full    Dental  (+) Teeth Intact, Dental Advisory Given,    Pulmonary Current Smoker,    breath sounds clear to auscultation       Cardiovascular  Rhythm:Regular Rate:Normal     Neuro/Psych    GI/Hepatic   Endo/Other    Renal/GU      Musculoskeletal   Abdominal   Peds  Hematology   Anesthesia Other Findings   Reproductive/Obstetrics                            Anesthesia Physical Anesthesia Plan  ASA: II  Anesthesia Plan: General   Post-op Pain Management:    Induction: Intravenous  PONV Risk Score and Plan: Ondansetron and Dexamethasone  Airway Management Planned: Oral ETT  Additional Equipment:   Intra-op Plan:   Post-operative Plan: Extubation in OR  Informed Consent: I have reviewed the patients History and Physical, chart, labs and discussed the procedure including the risks, benefits and alternatives for the proposed anesthesia with the patient or authorized representative who has indicated his/her understanding and acceptance.     Dental advisory given  Plan Discussed with: CRNA and Anesthesiologist  Anesthesia Plan Comments:         Anesthesia Quick Evaluation

## 2018-11-12 NOTE — Anesthesia Procedure Notes (Signed)
Procedure Name: Intubation Date/Time: 11/12/2018 9:35 AM Performed by: Julian Reil, CRNA Pre-anesthesia Checklist: Patient identified, Emergency Drugs available, Suction available and Patient being monitored Patient Re-evaluated:Patient Re-evaluated prior to induction Oxygen Delivery Method: Circle system utilized Preoxygenation: Pre-oxygenation with 100% oxygen Induction Type: Rapid sequence, IV induction and Cricoid Pressure applied Laryngoscope Size: Miller and 3 Grade View: Grade I Tube type: Oral Tube size: 7.0 mm Number of attempts: 1 Airway Equipment and Method: Stylet Placement Confirmation: ETT inserted through vocal cords under direct vision,  positive ETCO2 and breath sounds checked- equal and bilateral Secured at: 22 cm Tube secured with: Tape Dental Injury: Teeth and Oropharynx as per pre-operative assessment  Comments: RSI due to Covid-19 pandemic concerns.  4x4s bite block used.

## 2018-11-12 NOTE — Op Note (Signed)
Orthopaedic Surgery Operative Note (CSN: [id number] ) Date of Surgery: 11/12/2018  Admit Date: 11/11/2018   Diagnoses: Pre-Op Diagnoses: Right type 1 open distal clavicle fracture  Post-Op Diagnosis: Same  Procedures: 1. CPT 23515-Open reduction internal fixation of right distal clavicle fracture 2. CPT 11012-Irrigation and debridement of right open clavicle fracture  Surgeons : Primary: Roby Lofts, MD  Assistant: Ulyses Southward, PA-C  Location: OR 3   Anesthesia:General  Antibiotics: Ancef 2g preop   Tourniquet time: None  Estimated Blood Loss:75 mL  Complications:None  Specimens:None  Implants: Implant Name Type Inv. Item Serial No. Manufacturer Lot No. LRB No. Used  SCREW CORTEX 3.5 - [id number] Screw SCREW CORTEX 3.5  [employer]  Right 3  SCREW CORTEX 3.5 - [id number] Screw SCREW CORTEX 3.5  [employer]  Right 1  4 HOLE PLATE  Plate     Right 1  SCREW LOCKING 2.7X16MM - [id number] Screw SCREW LOCKING 2.7X16MM  [employer]  Right 2  SCREW LOCKING 2.7X12MM - [id number] Screw SCREW LOCKING 2.7X12MM  [employer]  Right 2  SCREW LOCKING 2.7X14MM - [id number] Screw SCREW LOCKING 2.7X14MM  [employer]  Right 1     Indications for Surgery: 38 year old female who had a fall and sustained a right distal clavicle fracture.  She also had a punctate wound over her posterior shoulder that continued to bleed and used a consistent with an open fracture.  She is admitted placed on IV antibiotics and I recommended proceeding with open reduction internal fixation of her clavicle along with irrigation debridement of her open fracture.  Risks and benefits were discussed with the patient.  This included but not limited to bleeding, infection, malunion, nonunion, hardware failure, hardware irritation, chronic shoulder pain, need for hardware removal, nerve and blood vessel injury, posttraumatic arthritis of the AC joint.  Patient agreed to proceed with surgery  and consent was obtained.  Operative Findings: 1.  Type I right open clavicle fracture treated with irrigation debridement. 2.  Open reduction internal fixation using [employer]ynthes 4-hole distal clavicle locking plate with a one 3.5 millimeter screw through the plate gaining purchase into the coracoid.  Procedure: The patient was identified in the preoperative holding area. Consent was confirmed with the patient and their family and all questions were answered. The operative extremity was marked after confirmation with the patient. she was then brought back to the operating room by our anesthesia colleagues.  She was placed under general anesthetic and carefully transferred over to a radiolucent flat top table.  A bump was placed under her shoulder blades to elevate her arm and to be able to access the open wound. The operative extremity was then prepped and draped in usual sterile fashion. A preoperative timeout was performed to verify the patient, the procedure, and the extremity. Preoperative antibiotics were dosed.  Fluoroscopic images were used to confirm the unstable nature of the injury as well as the fracture.  I then opened up the traumatic punctate lesions to approximately 1-1/2 cm I was able to palpate the fracture through the wound.  I then proceeded to make an incision over the distal portion of the clavicle.  I carried this down through skin and subcutaneous tissue.  I take care to preserve any nerve branches to the skin.  I incised through the fascia down to the bone.  Here I encountered the clavicle fracture with the [facility] joint ligament still intact.  The shaft fragment was significantly unstable.  I then  proceeded to use a curette to debride the bone ends.  I then used low pressure pulsatile lavage to irrigate the fracture with 3 L of normal saline.  I then changed gloves and instruments and then turned my attention to the fixation portion.  I chose a 4 hole distal clavicle locking plate.  I  reduced this to the shaft fragment and then clamped the plate to the distal fragment.  I held this provisionally with a K wire and confirmed adequate placement with fluoroscopic imaging.  I then proceeded to place 1 nonlocking screw into the clavicular shaft.  I then placed 4 locking screws in the distal segment.  I felt that then there was a high likelihood of loss of fixation in the distal segment as result I felt that proceeding with placing a cortical screw through the plate would be most appropriate.  I used a 2.5 mm drill bit to drill bicortically through the clavicle into the coracoid gaining bicortical purchase in both bones.  I then placed a 3.5 mm nonlocking screw.  I placed 2 more locking screws in the distal segment and then 2 nonlocking screws into the clavicle shaft fracture fragment.  Final fluoroscopic images were obtained.  The shoulder remained stable.  Irrigated the wound once more.  I placed a gram of vancomycin powder into the incision.  I closed the fascia with a 0 Vicryl suture.  The skin was closed with a 2-0 Vicryl and 3-0 Monocryl.  Dermabond was used to seal the skin.  The shoulder was dressed with a sterile dressing.  She was awoken from anesthesia and taken to PACU in stable condition.  Post Op Plan/Instructions: The patient will be nonweightbearing to the right upper extremity.  She will allow for active and passive range of motion of the shoulder.  She will remain in the hospital for another 24 hours to receive IV Ancef.  She will be discharged home tomorrow.  No DVT prophylaxis is needed in this otherwise healthy upper extremity patient.  I was present and performed the entire surgery.  Ulyses SouthwardSarah Yacobi, PA-C did assist me throughout the case. An assistant was necessary given the difficulty in approach, maintenance of reduction and ability to instrument the fracture.   Truitt MerleKevin Haddix, MD [facility]

## 2018-11-12 NOTE — Transfer of Care (Signed)
Immediate Anesthesia Transfer of Care Note  Patient: Sheila Graves  Procedure(s) Performed: OPEN REDUCTION INTERNAL FIXATION (ORIF) CLAVICULAR FRACTURE (Right ) Irrigation And Debridement Shoulder (Right Shoulder)  Patient Location: PACU  Anesthesia Type:General  Level of Consciousness: awake and patient cooperative  Airway & Oxygen Therapy: Patient Spontanous Breathing and Patient connected to nasal cannula oxygen  Post-op Assessment: Report given to RN and Post -op Vital signs reviewed and stable  Post vital signs: Reviewed and stable  Last Vitals:  Vitals Value Taken Time  BP 105/71 11/12/2018 11:28 AM  Temp    Pulse 93 11/12/2018 11:28 AM  Resp 10 11/12/2018 11:28 AM  SpO2 100 % 11/12/2018 11:28 AM  Vitals shown include unvalidated device data.  Last Pain:  Vitals:   11/12/18 0539  TempSrc:   PainSc: 6       Patients Stated Pain Goal: 0 (11/12/18 0539)  Complications: No apparent anesthesia complications

## 2018-11-12 NOTE — Discharge Instructions (Signed)
[facility] Discharge Instructions   General Discharge Instructions  WEIGHT BEARING STATUS: Non-weightbearing right upper extremity  RANGE OF MOTION/ACTIVITY: Okay for gentle range of motion of right shoulder. Full unrestricted range of motion of elbow  Wound Care: Dressing can be removed and left open to air on post-op day #2 (Monday 11/14/18)  DVT/PE prophylaxis: None needed   Diet: as you were eating previously.  Can use over the counter stool softeners and bowel preparations, such as Miralax, to help with bowel movements.  Narcotics can be constipating.  Be sure to drink plenty of fluids  PAIN MEDICATION USE AND EXPECTATIONS  You have likely been given narcotic medications to help control your pain.  After a traumatic event that results in an fracture (broken bone) with or without surgery, it is ok to use narcotic pain medications to help control one's pain.  We understand that everyone responds to pain differently and each individual patient will be evaluated on a regular basis for the continued need for narcotic medications. Ideally, narcotic medication use should last no more than 6-8 weeks (coinciding with fracture healing).   As a patient it is your responsibility as well to monitor narcotic medication use and report the amount and frequency you use these medications when you come to your office visit.   We would also advise that if you are using narcotic medications, you should take a dose prior to therapy to maximize you participation.  IF YOU ARE ON NARCOTIC MEDICATIONS IT IS NOT PERMISSIBLE TO OPERATE A MOTOR VEHICLE (MOTORCYCLE/CAR/TRUCK/MOPED) OR HEAVY MACHINERY DO NOT MIX NARCOTICS WITH OTHER CNS (CENTRAL NERVOUS SYSTEM) DEPRESSANTS SUCH AS ALCOHOL   STOP SMOKING OR USING NICOTINE PRODUCTS!!!!  As discussed nicotine severely impairs your body's ability to heal surgical and traumatic wounds but also impairs bone healing.  Wounds and bone heal by forming  microscopic blood vessels (angiogenesis) and nicotine is a vasoconstrictor (essentially, shrinks blood vessels).  Therefore, if vasoconstriction occurs to these microscopic blood vessels they essentially disappear and are unable to deliver necessary nutrients to the healing tissue.  This is one modifiable factor that you can do to dramatically increase your chances of healing your injury.    (This means no smoking, no nicotine gum, patches, etc)  DO NOT USE NONSTEROIDAL ANTI-INFLAMMATORY DRUGS (NSAID'S)  Using products such as Advil (ibuprofen), Aleve (naproxen), Motrin (ibuprofen) for additional pain control during fracture healing can delay and/or prevent the healing response.  If you would like to take over the counter (OTC) medication, Tylenol (acetaminophen) is ok.  However, some narcotic medications that are given for pain control contain acetaminophen as well. Therefore, you should not exceed more than 4000 mg of tylenol in a day if you do not have liver disease.  Also note that there are may OTC medicines, such as cold medicines and allergy medicines that my contain tylenol as well.  If you have any questions about medications and/or interactions please ask your doctor/PA or your pharmacist.      ICE AND ELEVATE INJURED/OPERATIVE EXTREMITY  Using ice and elevating the injured extremity above your heart can help with swelling and pain control.  Icing in a pulsatile fashion, such as 20 minutes on and 20 minutes off, can be followed.    Do not place ice directly on skin. Make sure there is a barrier between to skin and the ice pack.    Using frozen items such as frozen peas works well as the conform nicely to the are  that needs to be iced.  USE AN ACE WRAP OR TED HOSE FOR SWELLING CONTROL  In addition to icing and elevation, Ace wraps or TED hose are used to help limit and resolve swelling.  It is recommended to use Ace wraps or TED hose until you are informed to stop.    When using Ace Wraps  start the wrapping distally (farthest away from the body) and wrap proximally (closer to the body)   Example: If you had surgery on your leg or thing and you do not have a splint on, start the ace wrap at the toes and work your way up to the thigh        If you had surgery on your upper extremity and do not have a splint on, start the ace wrap at your fingers and work your way up to the upper arm   CALL THE OFFICE WITH ANY QUESTIONS OR CONCERNS: [phone]   VISIT OUR WEBSITE FOR ADDITIONAL INFORMATION: [email or web address]    Discharge Wound Care Instructions  Do NOT apply any ointments, solutions or lotions to pin sites or surgical wounds.  These prevent needed drainage and even though solutions like hydrogen peroxide kill bacteria, they also damage cells lining the pin sites that help fight infection.  Applying lotions or ointments can keep the wounds moist and can cause them to breakdown and open up as well. This can increase the risk for infection. When in doubt call the office.  Surgical incisions should be dressed daily.  If any drainage is noted, use one layer of adaptic, then gauze, Kerlix, and an ace wrap.  Once the incision is completely dry and without drainage, it may be left open to air out.  Showering may begin 36-48 hours later.  Cleaning gently with soap and water.  Traumatic wounds should be dressed daily as well.    One layer of adaptic, gauze, Kerlix, then ace wrap.  The adaptic can be discontinued once the draining has ceased    If you have a wet to dry dressing: wet the gauze with saline the squeeze as much saline out so the gauze is moist (not soaking wet), place moistened gauze over wound, then place a dry gauze over the moist one, followed by Kerlix wrap, then ace wrap.

## 2018-11-12 NOTE — Anesthesia Postprocedure Evaluation (Signed)
Anesthesia Post Note  Patient: Sheila Graves  Procedure(s) Performed: OPEN REDUCTION INTERNAL FIXATION (ORIF) CLAVICULAR FRACTURE (Right ) Irrigation And Debridement Shoulder (Right Shoulder)     Patient location during evaluation: PACU Anesthesia Type: General Level of consciousness: awake and alert Pain management: pain level controlled Vital Signs Assessment: post-procedure vital signs reviewed and stable Respiratory status: spontaneous breathing, nonlabored ventilation, respiratory function stable and patient connected to nasal cannula oxygen Cardiovascular status: blood pressure returned to baseline and stable Postop Assessment: no apparent nausea or vomiting Anesthetic complications: no    Last Vitals:  Vitals:   11/12/18 1307 11/12/18 1802  BP: 117/86 114/82  Pulse: 81 87  Resp: 12 12  Temp: (!) 36.3 C 36.8 C  SpO2: 97% 99%    Last Pain:  Vitals:   11/12/18 1802  TempSrc: Oral  PainSc:                  Spence Soberano COKER

## 2018-11-12 NOTE — Interval H&P Note (Signed)
History and Physical Interval Note:  11/12/2018 9:07 AM  Sheila Graves  has presented today for surgery, with the diagnosis of Right open clavicle fracture.  The various methods of treatment have been discussed with the patient and family. After consideration of risks, benefits and other options for treatment, the patient has consented to  Procedure(s): OPEN REDUCTION INTERNAL FIXATION (ORIF) CLAVICULAR FRACTURE (Right) as a surgical intervention.  The patient's history has been reviewed, patient examined, no change in status, stable for surgery.  I have reviewed the patient's chart and labs.  Questions were answered to the patient's satisfaction.     Caryn Bee P Haddix

## 2018-11-12 NOTE — Discharge Summary (Signed)
Orthopaedic Trauma Service (OTS) Discharge Summary   Patient ID: Sheila Graves MRN: [id number] DOB/AGE: Feb 05, 1981 38 y.o.  Admit date: 11/11/2018 Discharge date: 11/13/2018  Admission Diagnoses: Right open clavicle fracture  Discharge Diagnoses:  Active Problems:   Fracture of clavicle, right, open   Past Medical History:  Diagnosis Date  . Alcohol withdrawal seizure (HCC)   . ETOH abuse   . Hypokalemia   . Pancreatitis   . Thyroid disease      Procedures Performed: 1. CPT 23515-Open reduction internal fixation of right distal clavicle fracture 2. CPT 11012-Irrigation and debridement of right open clavicle fracture  Discharged Condition: good/stable  Hospital Course: [facility] with complaints of right shoulder pain after sustaining a fall while walking her dog. She was found to have a right distal clavicle fracture. She had a small posterior shoulder wound that was concerning for an open fracture, but depth could not be assessed due to size of small wound. Pressure dressing was applied to shoulder, patient was provided a sling, and was admitted to the orthopaedic trauma service for surgical fixation of fracture. Was given Ancef for surgical prophylaxis. Patient was taken to the operating room by Dr. Jena GaussHaddix on 11/12/2018 for irrigation debridement and open reduction internal fixation of distal right clavicle fracture. She tolerated the procedure well. She was placed back in a sling post-operatively. She remained in the hospital for an additional 24 hours for IV Ancef. On 11/13/2018, the patient was tolerating diet, pain well controlled, vital signs stable, dressings clean, dry, intact and felt stable for discharge to home. Patient will follow up as below and knows to call with questions or concerns.     Consults: None  Significant Diagnostic Studies: labs: Ethanol 330 on admission  Treatments: surgery: 1. CPT 23515-Open reduction  internal fixation of right distal clavicle fracture 2. CPT 11012-Irrigation and debridement of right open clavicle fracture  Discharge Exam: General - Sitting up in bed, NAD  Cardiac - heart regular rate and rhythm Respiratory - no increased work of breathing. Clear to auscultation anterior lung fields bilaterally Right Upper Extremity - Not currently using sling. Dressing clean, dry, intact. Tenderness over posterior shoulder and clavicle. Less tender in arm. Excellent elbow ROM. Passive shoulder ROM to about 80 degrees without significant discomfort. Sensation and motor function intact. Neruovascularly intact.  Disposition: Discharge disposition: 01-Home or Self Care       Discharge Instructions    Call MD / Call 911   Complete by:  As directed    If you experience chest pain or shortness of breath, CALL 911 and be transported to the hospital emergency room.  If you develope a fever above 101 F, pus (white drainage) or increased drainage or redness at the wound, or calf pain, call your surgeon's office.   Constipation Prevention   Complete by:  As directed    Drink plenty of fluids.  Prune juice may be helpful.  You may use a stool softener, such as Colace (over the counter) 100 mg twice a day.  Use MiraLax (over the counter) for constipation as needed.   Diet - low sodium heart healthy   Complete by:  As directed    Increase activity slowly as tolerated   Complete by:  As directed    Lifting restrictions   Complete by:  As directed    No weight bearing on right arm for 4 weeks     Allergies as of 11/13/2018  No Known Allergies     Medication List    STOP taking these medications   acamprosate 333 MG tablet Commonly known as:  CAMPRAL   thiamine 100 MG tablet   traZODone 50 MG tablet Commonly known as:  DESYREL     TAKE these medications   acetaminophen 500 MG tablet Commonly known as:  TYLENOL Take 1 tablet (500 mg total) by mouth every 12 (twelve) hours.    oxyCODONE 5 MG immediate release tablet Commonly known as:  Oxy IR/ROXICODONE Take 1 tablet (5 mg total) by mouth every 4 (four) hours as needed for up to 5 days for moderate pain or severe pain.      Follow-up Information    Haddix, Gillie Manners, MD. Schedule an appointment as soon as possible for a visit in 2 week(s).   Specialty:  Orthopedic Surgery Why:  for repeat x-rays Contact information: [address] [place] [address] [phone]           Discharge Instructions and Plan: Patient will be discharged to home. She will be non-weightbearing on the right upper extremity. She has been provided a sling for comfort. She is okay to perform gentle active and passive range of motion of the shoulder. She will have unrestricted range of motion of the elbow.  She will not need any DVT prophylaxis. She will follow up in 2 weeks with Dr. Jena Gauss for repeat x-rays of the right clavicle.   Signed:  Shawn Route. Ladonna Snide ?[phone]? (phone) 11/13/2018, 10:02 AM  [facility] [address] [place] [address] [phone] (F)

## 2018-11-13 NOTE — Progress Notes (Signed)
AVS given and reviewed with pt. Medications discussed. Sling shoulder immobilizer remains in place. All questions answered to satisfaction. Pt to be escorted off the unit via wheelchair by staff member.

## 2018-11-13 NOTE — Plan of Care (Signed)

## 2018-11-14 ENCOUNTER — Encounter (HOSPITAL_COMMUNITY): Payer: Self-pay | Admitting: Student

## 2018-12-05 ENCOUNTER — Other Ambulatory Visit: Payer: Self-pay

## 2018-12-05 ENCOUNTER — Emergency Department (HOSPITAL_COMMUNITY): Payer: Self-pay

## 2018-12-05 ENCOUNTER — Encounter (HOSPITAL_COMMUNITY): Payer: Self-pay | Admitting: Emergency Medicine

## 2018-12-05 ENCOUNTER — Emergency Department (HOSPITAL_COMMUNITY)
Admission: EM | Admit: 2018-12-05 | Discharge: 2018-12-06 | Disposition: A | Discharge status: Home / Self Care | Payer: Self-pay | Attending: Emergency Medicine | Admitting: Emergency Medicine

## 2018-12-05 DIAGNOSIS — Y908 Blood alcohol level of 240 mg/100 ml or more: Secondary | ICD-10-CM | POA: Insufficient documentation

## 2018-12-05 DIAGNOSIS — R51 Headache: Secondary | ICD-10-CM | POA: Insufficient documentation

## 2018-12-05 DIAGNOSIS — F10920 Alcohol use, unspecified with intoxication, uncomplicated: Secondary | ICD-10-CM

## 2018-12-05 DIAGNOSIS — F17200 Nicotine dependence, unspecified, uncomplicated: Secondary | ICD-10-CM | POA: Insufficient documentation

## 2018-12-05 LAB — BASIC METABOLIC PANEL
Anion gap: 16 — ABNORMAL HIGH (ref 5–15)
BUN: 5 mg/dL — ABNORMAL LOW (ref 6–20)
CO2: 30 mmol/L (ref 22–32)
Calcium: 8.5 mg/dL — ABNORMAL LOW (ref 8.9–10.3)
Chloride: 95 mmol/L — ABNORMAL LOW (ref 98–111)
Creatinine, Ser: 0.68 mg/dL (ref 0.44–1.00)
GFR calc Af Amer: >60 mL/min (ref >60)
GFR calc non Af Amer: >60 mL/min (ref >60)
Glucose, Bld: 109 mg/dL — ABNORMAL HIGH (ref 70–99)
Potassium: 2.3 mmol/L — CL (ref 3.5–5.1)
Sodium: 141 mmol/L (ref 135–145)

## 2018-12-05 LAB — CBC WITH DIFFERENTIAL/PLATELET
Abs Immature Granulocytes: 0.01 10*3/uL (ref 0.00–0.07)
Basophils Absolute: 0 10*3/uL (ref 0.0–0.1)
Basophils Relative: 1 %
Eosinophils Absolute: 0 10*3/uL (ref 0.0–0.5)
Eosinophils Relative: 0 %
HCT: 39.7 % (ref 36.0–46.0)
Hemoglobin: 13.8 g/dL (ref 12.0–15.0)
Immature Granulocytes: 0 %
Lymphocytes Relative: 45 %
Lymphs Abs: 1.7 10*3/uL (ref 0.7–4.0)
MCH: 33.3 pg (ref 26.0–34.0)
MCHC: 34.8 g/dL (ref 30.0–36.0)
MCV: 95.7 fL (ref 80.0–100.0)
Monocytes Absolute: 0.4 10*3/uL (ref 0.1–1.0)
Monocytes Relative: 11 %
Neutro Abs: 1.7 10*3/uL (ref 1.7–7.7)
Neutrophils Relative %: 43 %
Platelets: 222 10*3/uL (ref 150–400)
RBC: 4.15 MIL/uL (ref 3.87–5.11)
RDW: 12.7 % (ref 11.5–15.5)
WBC: 3.8 10*3/uL — ABNORMAL LOW (ref 4.0–10.5)
nRBC: 0 % (ref 0.0–0.2)

## 2018-12-05 LAB — ETHANOL: Alcohol, Ethyl (B): 513 mg/dL (ref <10)

## 2018-12-05 LAB — RAPID URINE DRUG SCREEN, HOSP PERFORMED
Amphetamines: NOT DETECTED
Barbiturates: NOT DETECTED
Benzodiazepines: NOT DETECTED
Cocaine: NOT DETECTED
Opiates: NOT DETECTED
Tetrahydrocannabinol: NOT DETECTED

## 2018-12-05 LAB — I-STAT BETA HCG BLOOD, ED (MC, WL, AP ONLY): I-stat hCG, quantitative: <5 m[IU]/mL (ref <5)

## 2018-12-05 LAB — CBG MONITORING, ED: Glucose-Capillary: 102 mg/dL — ABNORMAL HIGH (ref 70–99)

## 2018-12-05 MED ORDER — LACTATED RINGERS IV BOLUS
1000.0000 mL | Freq: Once | INTRAVENOUS | Status: AC
  Administered 2018-12-05: 23:00:00 1000 mL via INTRAVENOUS

## 2018-12-05 MED ORDER — LACTATED RINGERS IV BOLUS
1000.0000 mL | Freq: Once | INTRAVENOUS | Status: AC
  Administered 2018-12-05: 17:00:00 1000 mL via INTRAVENOUS

## 2018-12-05 MED ORDER — POTASSIUM CHLORIDE 10 MEQ/100ML IV SOLN
10.0000 meq | INTRAVENOUS | Status: AC
  Administered 2018-12-05 – 2018-12-06 (×4): 10 meq via INTRAVENOUS
  Filled 2018-12-05 (×4): qty 100

## 2018-12-05 MED ORDER — POTASSIUM CHLORIDE 10 MEQ/100ML IV SOLN
10.0000 meq | INTRAVENOUS | Status: AC
  Administered 2018-12-05 (×4): 10 meq via INTRAVENOUS
  Filled 2018-12-05 (×4): qty 100
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
## 2018-12-05 NOTE — ED Notes (Signed)
Fiance, lauren- (primary) [phone] (secondary) [phone]
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
## 2018-12-06 LAB — POTASSIUM: Potassium: 3.2 mmol/L — ABNORMAL LOW (ref 3.5–5.1)

## 2018-12-06 NOTE — ED Provider Notes (Signed)
Patient care assumed at shift change.  Patient with alcohol intoxication.  She is sleepy but arousable to painful stimuli.  No fever, no increased work of breathing.  Found to have hypokalemia which is being repleted.  CT head is negative.  Patient remains obtunded still not awake enough to ambulate or follow commands.  She is breathing on her own with stable vital signs.  Repeat potassium is improved to 3.2.  Patient still not awake enough to be discharged at 7 AM.  Care will be transferred oncoming shift.  Vitals remained stable.  Patient is protecting airway awaken to painful stimuli.    Ezequiel Essex, MD 12/06/18 [phone]

## 2018-12-06 NOTE — ED Notes (Signed)
Patient still crying unable to hold conversation with this RN or advise [place] who to call to pick her up (name and number in chart of patients SO) at this time, patient does not appear stable enough for discharge. Charge RN  Made aware and patient can hold in purple zone until sober enough for release.

## 2018-12-06 NOTE — ED Provider Notes (Signed)
7:38 AM Pt is now awake and alert. She is stable for discharge from the ED   Jola Schmidt, MD 12/06/18 [phone]
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
## 2018-12-06 NOTE — ED Notes (Signed)
Attempted to wake patient. Pt withdraws to painful stimuli but does not wake. Vital signs stable. Will continue to monitor.

## 2018-12-06 NOTE — ED Notes (Signed)
Patient aroussable but will not speak to this RN or cooperate with any requests at this time

## 2018-12-29 ENCOUNTER — Emergency Department (HOSPITAL_COMMUNITY)
Admission: EM | Admit: 2018-12-29 | Discharge: 2018-12-29 | Disposition: A | Discharge status: Home / Self Care | Payer: Self-pay | Attending: Emergency Medicine | Admitting: Emergency Medicine

## 2018-12-29 ENCOUNTER — Emergency Department (HOSPITAL_COMMUNITY): Payer: Self-pay

## 2018-12-29 ENCOUNTER — Encounter (HOSPITAL_COMMUNITY): Payer: Self-pay | Admitting: *Deleted

## 2018-12-29 ENCOUNTER — Other Ambulatory Visit: Payer: Self-pay

## 2018-12-29 DIAGNOSIS — R45851 Suicidal ideations: Secondary | ICD-10-CM | POA: Insufficient documentation

## 2018-12-29 DIAGNOSIS — Z5320 Procedure and treatment not carried out because of patient's decision for unspecified reasons: Secondary | ICD-10-CM | POA: Insufficient documentation

## 2018-12-29 DIAGNOSIS — F17210 Nicotine dependence, cigarettes, uncomplicated: Secondary | ICD-10-CM | POA: Insufficient documentation

## 2018-12-29 DIAGNOSIS — Z79899 Other long term (current) drug therapy: Secondary | ICD-10-CM | POA: Insufficient documentation

## 2018-12-29 DIAGNOSIS — F10920 Alcohol use, unspecified with intoxication, uncomplicated: Secondary | ICD-10-CM | POA: Insufficient documentation

## 2018-12-29 DIAGNOSIS — Y908 Blood alcohol level of 240 mg/100 ml or more: Secondary | ICD-10-CM | POA: Insufficient documentation

## 2018-12-29 DIAGNOSIS — F329 Major depressive disorder, single episode, unspecified: Secondary | ICD-10-CM | POA: Insufficient documentation

## 2018-12-29 LAB — CBC WITH DIFFERENTIAL/PLATELET
Abs Immature Granulocytes: 0.02 10*3/uL (ref 0.00–0.07)
Basophils Absolute: 0 10*3/uL (ref 0.0–0.1)
Basophils Relative: 1 %
Eosinophils Absolute: 0 10*3/uL (ref 0.0–0.5)
Eosinophils Relative: 0 %
HCT: 41 % (ref 36.0–46.0)
Hemoglobin: 13.6 g/dL (ref 12.0–15.0)
Immature Granulocytes: 1 %
Lymphocytes Relative: 46 %
Lymphs Abs: 1.6 10*3/uL (ref 0.7–4.0)
MCH: 32.9 pg (ref 26.0–34.0)
MCHC: 33.2 g/dL (ref 30.0–36.0)
MCV: 99.3 fL (ref 80.0–100.0)
Monocytes Absolute: 0.3 10*3/uL (ref 0.1–1.0)
Monocytes Relative: 8 %
Neutro Abs: 1.5 10*3/uL — ABNORMAL LOW (ref 1.7–7.7)
Neutrophils Relative %: 44 %
Platelets: 209 10*3/uL (ref 150–400)
RBC: 4.13 MIL/uL (ref 3.87–5.11)
RDW: 13.2 % (ref 11.5–15.5)
WBC: 3.4 10*3/uL — ABNORMAL LOW (ref 4.0–10.5)
nRBC: 0 % (ref 0.0–0.2)

## 2018-12-29 LAB — COMPREHENSIVE METABOLIC PANEL
ALT: 15 U/L (ref 0–44)
AST: 26 U/L (ref 15–41)
Albumin: 3.5 g/dL (ref 3.5–5.0)
Alkaline Phosphatase: 64 U/L (ref 38–126)
Anion gap: 13 (ref 5–15)
BUN: 8 mg/dL (ref 6–20)
CO2: 25 mmol/L (ref 22–32)
Calcium: 8.3 mg/dL — ABNORMAL LOW (ref 8.9–10.3)
Chloride: 109 mmol/L (ref 98–111)
Creatinine, Ser: 0.8 mg/dL (ref 0.44–1.00)
GFR calc Af Amer: >60 mL/min (ref >60)
GFR calc non Af Amer: >60 mL/min (ref >60)
Glucose, Bld: 91 mg/dL (ref 70–99)
Potassium: 3.3 mmol/L — ABNORMAL LOW (ref 3.5–5.1)
Sodium: 147 mmol/L — ABNORMAL HIGH (ref 135–145)
Total Bilirubin: 0.6 mg/dL (ref 0.3–1.2)
Total Protein: 6 g/dL — ABNORMAL LOW (ref 6.5–8.1)

## 2018-12-29 LAB — I-STAT BETA HCG BLOOD, ED (MC, WL, AP ONLY): I-stat hCG, quantitative: <5 m[IU]/mL (ref <5)

## 2018-12-29 LAB — LIPASE, BLOOD: Lipase: 25 U/L (ref 11–51)

## 2018-12-29 LAB — ACETAMINOPHEN LEVEL: Acetaminophen (Tylenol), Serum: <10 ug/mL — ABNORMAL LOW (ref 10–30)

## 2018-12-29 LAB — SALICYLATE LEVEL: Salicylate Lvl: <7 mg/dL (ref 2.8–30.0)

## 2018-12-29 LAB — ETHANOL: Alcohol, Ethyl (B): 508 mg/dL (ref <10)

## 2018-12-29 MED ORDER — LACTATED RINGERS IV BOLUS
1000.0000 mL | Freq: Once | INTRAVENOUS | Status: AC
  Administered 2018-12-29: 12:00:00 1000 mL via INTRAVENOUS

## 2018-12-29 MED ORDER — LACTATED RINGERS IV BOLUS
1000.0000 mL | Freq: Once | INTRAVENOUS | Status: AC
  Administered 2018-12-29: 1000 mL via INTRAVENOUS

## 2018-12-29 MED ORDER — POTASSIUM CHLORIDE CRYS ER 20 MEQ PO TBCR
40.0000 meq | EXTENDED_RELEASE_TABLET | Freq: Once | ORAL | Status: AC
  Administered 2018-12-29: 16:00:00 40 meq via ORAL
  Filled 2018-12-29: qty 2

## 2018-12-29 NOTE — ED Notes (Signed)
Pt made aware of dangers of leaving against medical advice. Pt cussing at staff wanting to leave. This RN was informed pt was not IVC'd by TTS and could leave by signing AMA form which pt did. Pt alert and oriented X4 and ambulatory.  Pt was educated that someone needed to come pick her up in order to be discharged at this time. Pt said girlfriend Ander Purpura was picking her up. This RN overheard person on phone saying she would not come get her. Pt then said she was. Pt was told to wait in room for ride and this RN would verify pt being picked up.  Pt agreed to wait in room. Pt then fled through the EMS doors and would not stop for staff. [name] was called by this RN but call went to VM. Left message.

## 2018-12-29 NOTE — BH Assessment (Signed)
Tele Assessment Note   Patient Name: Sheila Graves MRN: [id number] Referring Physician: ED Physician Location of Patient: [facility] Location of Provider: Behavioral Health TTS Department  Sheila Graves is an 38 y.o. female. Pt denies SI/HI and AVH. Pt was brought in by [employer] due to being intoxicated in her front yard. Pt's family states that the Pt stated that she was suicidal. Pt currently denies SI. Pt states "I'm an angry alcoholic." Per Pt when she drinks heavily she makes "mean" statements in order to upset her family. The Pt states she is currently seeking long-term SA treatment.   Sheila FillersLaShunda, NP recommended am psych evaluation.  Pt left AMA.  Diagnosis:  F10.20 Alcohol use, severe  Past Medical History:  Past Medical History:  Diagnosis Date  . Alcohol withdrawal seizure (HCC)   . ETOH abuse   . Hypokalemia   . Pancreatitis   . Thyroid disease     Past Surgical History:  Procedure Laterality Date  . FINGER SURGERY    . IRRIGATION AND DEBRIDEMENT SHOULDER Right 11/12/2018   Procedure: Irrigation And Debridement Shoulder;  Surgeon: Roby LoftsHaddix, Kevin P, MD;  Location: [facility];  Service: Orthopedics;  Laterality: Right;  . ORIF CLAVICULAR FRACTURE Right 11/12/2018   Procedure: OPEN REDUCTION INTERNAL FIXATION (ORIF) CLAVICULAR FRACTURE;  Surgeon: Roby LoftsHaddix, Kevin P, MD;  Location: [facility];  Service: Orthopedics;  Laterality: Right;    Family History: No family history on file.  Social History:  reports that she has been smoking. She has been smoking about 1.00 pack per day. She has never used smokeless tobacco. She reports current alcohol use. She reports current drug use.  Additional Social History:  Alcohol / Drug Use Pain Medications: please see mar Prescriptions: please see mar Over the Counter: please see mar History of alcohol / drug use?: Yes Longest period of sobriety (when/how long): unknown Negative Consequences of Use: Financial, Legal, Personal relationships, Work /  School Withdrawal Symptoms: Agitation, Irritability, Nausea / Vomiting Substance #1 Name of Substance 1: alcohol  CIWA: CIWA-Ar BP: 131/67 Pulse Rate: 87 Nausea and Vomiting: no nausea and no vomiting Tactile Disturbances: none Tremor: no tremor Auditory Disturbances: not present Paroxysmal Sweats: no sweat visible Visual Disturbances: not present Anxiety: three Headache, Fullness in Head: none present Agitation: moderately fidgety and restless Orientation and Clouding of Sensorium: oriented and can do serial additions CIWA-Ar Total: 7 COWS:    Allergies: No Known Allergies  Home Medications: (Not in a hospital admission)   OB/GYN Status:  No LMP recorded.  General Assessment Data TTS Assessment: In system Is this a Tele or Face-to-Face Assessment?: Tele Assessment Is this an Initial Assessment or a Re-assessment for this encounter?: Initial Assessment Patient Accompanied by:: N/A Language Other than English: No Living Arrangements: Other (Comment) What gender do you identify as?: Female Marital status: Single Maiden name: NA Pregnancy Status: No Living Arrangements: Spouse/significant other Can pt return to current living arrangement?: Yes Admission Status: Voluntary Is patient capable of signing voluntary admission?: Yes Referral Source: Self/Family/Friend Insurance type: Medicaid     Crisis Care Plan Living Arrangements: Spouse/significant other Legal Guardian: Other:(self) Name of Psychiatrist: NA Name of Therapist: NA  Education Status Is patient currently in school?: No Is the patient employed, unemployed or receiving disability?: Unemployed  Risk to self with the past 6 months Suicidal Ideation: No Has patient been a risk to self within the past 6 months prior to admission? : No Suicidal Intent: No Has patient had any suicidal intent within the past 6  months prior to admission? : No Is patient at risk for suicide?: No Suicidal Plan?: No Has  patient had any suicidal plan within the past 6 months prior to admission? : No Access to Means: No What has been your use of drugs/alcohol within the last 12 months?: NA Previous Attempts/Gestures: No How many times?: 0 Other Self Harm Risks: NA Triggers for Past Attempts: None known Intentional Self Injurious Behavior: None Family Suicide History: No Recent stressful life event(s): Conflict (Comment) Persecutory voices/beliefs?: No Depression: Yes Depression Symptoms: Loss of interest in usual pleasures, Feeling worthless/self pity, Feeling angry/irritable Substance abuse history and/or treatment for substance abuse?: No Suicide prevention information given to non-admitted patients: Not applicable  Risk to Others within the past 6 months Homicidal Ideation: No Does patient have any lifetime risk of violence toward others beyond the six months prior to admission? : No Thoughts of Harm to Others: No Current Homicidal Intent: No Current Homicidal Plan: No Access to Homicidal Means: No Identified Victim: NA History of harm to others?: No Assessment of Violence: None Noted Violent Behavior Description: NA Does patient have access to weapons?: No Criminal Charges Pending?: No Does patient have a court date: No Is patient on probation?: No  Psychosis Hallucinations: None noted Delusions: None noted  Mental Status Report Appearance/Hygiene: Unremarkable Eye Contact: Fair Motor Activity: Freedom of movement Speech: Logical/coherent Level of Consciousness: Alert Mood: Pleasant Affect: Appropriate to circumstance Anxiety Level: None Thought Processes: Coherent, Relevant Judgement: Unimpaired Orientation: Person, Place, Time, Situation Obsessive Compulsive Thoughts/Behaviors: None  Cognitive Functioning Concentration: Normal Memory: Recent Intact, Remote Intact Is patient IDD: No Insight: Fair Impulse Control: Fair Appetite: Fair Have you had any weight changes? : No  Change Sleep: Decreased Total Hours of Sleep: 5 Vegetative Symptoms: None  ADLScreening [facility] Assessment Services) Patient's cognitive ability adequate to safely complete daily activities?: Yes Patient able to express need for assistance with ADLs?: Yes Independently performs ADLs?: Yes (appropriate for developmental age)  Prior Inpatient Therapy Prior Inpatient Therapy: Yes Prior Therapy Dates: unknown Prior Therapy Facilty/Provider(s): unknown Reason for Treatment: SA  Prior Outpatient Therapy Prior Outpatient Therapy: No  ADL Screening (condition at time of admission) Patient's cognitive ability adequate to safely complete daily activities?: Yes Is the patient deaf or have difficulty hearing?: No Does the patient have difficulty seeing, even when wearing glasses/contacts?: No Does the patient have difficulty concentrating, remembering, or making decisions?: No Patient able to express need for assistance with ADLs?: Yes Does the patient have difficulty dressing or bathing?: No Independently performs ADLs?: Yes (appropriate for developmental age)       Abuse/Neglect Assessment (Assessment to be complete while patient is alone) Abuse/Neglect Assessment Can Be Completed: Yes Physical Abuse: Denies Verbal Abuse: Denies Sexual Abuse: Denies Exploitation of patient/patient's resources: Denies     Regulatory affairs officer (For Healthcare) Does Patient Have a Medical Advance Directive?: No Would patient like information on creating a medical advance directive?: No - Patient declined          Disposition:  Disposition Initial Assessment Completed for this Encounter: Yes  This service was provided via telemedicine using a 2-way, interactive audio and video technology.  Names of all persons participating in this telemedicine service and their role in this encounter. Name: [id number] Role: NP  Name:  Role:   Name:  Role:  Name:  Role:    Cyndia Bent 12/29/2018 7:18 PM

## 2018-12-29 NOTE — ED Provider Notes (Addendum)
Behavioral health staff indicate the patient does not meet criteria for involuntary commitment.  The recommendation is for her to sober up more and be reassessed as needed. I discussed this with the patient, or at least attempted to do so, but she was unwilling to hear this assessment, was swearing, aggressive, and belligerent. Patient denies any complaints, states that she acknowledges her alcohol abuse, is not interested in further assistance or evaluation by [place]. Patient is awake, alert, speaking clearly, is oriented appropriately and that she is numerically likely still intoxicated, she has a companion who will pick her up, and is leaving [place].   Carmin Muskrat, MD 12/29/18 1748  5:57 PM Before the patient's ride arrived with the patient left the room, ran through the ambulance bay doors, and then myself and others tried to stop her, including going outside, she was unable to be stopped, as she was seen running away.    Carmin Muskrat, MD 12/29/18 [id number]

## 2018-12-29 NOTE — ED Triage Notes (Signed)
Patient presents to ed per ems patient was found in the driveway unsure how she got there. Ems states patient was alert however drowsy.Family states she can drink 1/5 of volka in a very short period of time and does this often. Questionable over usage of percocet. Patient was in cont of urine

## 2018-12-29 NOTE — ED Notes (Signed)
Pt called the hospital operator, who called me and said she needs help and that she hasnt been checked on in awhile

## 2018-12-29 NOTE — ED Notes (Signed)
Patient is yelling taking her clothes off, attempted several times to reorient patient and she states she wants to go home. Informed patient that she is going to have to stay her for a little longer due to her ETOH level being to high.

## 2018-12-29 NOTE — ED Provider Notes (Cosign Needed Addendum)
[facility]   CSN: [id number] Arrival date & time: 12/29/18  1026    History   Chief Complaint Chief Complaint  Patient presents with  . Altered Mental Status    HPI Sheila Graves is a 38 y.o. female with a past medical history of alcohol abuse, hypokalemia, alcohol withdrawal seizures presents to ED for evaluation of intoxication.  EMS unsure of exact reason they were called to home.  Patient was found in her driveway.  Patient is unsure if she had a head injury or fall.  Family states that she drinks a fifth of liquor at least every day.  Patient somewhat aggressive but alert, unwilling to answer questions, mumbling.     HPI  Past Medical History:  Diagnosis Date  . Alcohol withdrawal seizure (HCC)   . ETOH abuse   . Hypokalemia   . Pancreatitis   . Thyroid disease     Patient Active Problem List   Diagnosis Date Noted  . Fracture of clavicle, right, open 11/11/2018  . Alcohol dependence with withdrawal (HCC) 01/31/2018  . 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 FINGER SURGERY    . IRRIGATION AND DEBRIDEMENT SHOULDER Right 11/12/2018   Procedure: Irrigation And Debridement Shoulder;  Surgeon: Roby LoftsHaddix, Kevin P, MD;  Location: [facility];  Service: Orthopedics;  Laterality: Right;  . ORIF CLAVICULAR FRACTURE Right 11/12/2018   Procedure: OPEN REDUCTION INTERNAL FIXATION (ORIF) CLAVICULAR FRACTURE;  Surgeon: Roby LoftsHaddix, Kevin P, MD;  Location: [facility];  Service: Orthopedics;  Laterality: Right;     OB History   No obstetric history on file.      Home Medications    Prior to Admission medications   Medication Sig Start Date End Date Taking? Authorizing Provider  acetaminophen (TYLENOL) 500 MG tablet Take 1 tablet (500 mg total) by mouth every 12 (twelve) hours. 11/12/18  Yes Despina HiddenYacobi, Sarah A, PA-C  HYDROcodone-acetaminophen  (NORCO/VICODIN) 5-325 MG tablet Take 1 tablet by mouth every 6 (six) hours as needed for moderate pain.   Yes [provider]  oxyCODONE (OXY IR/ROXICODONE) 5 MG immediate release tablet Take 5 mg by mouth every 4 (four) hours as needed for moderate pain or severe pain.    [provider]    Family History No family history on file.  Social History Social History   Tobacco Use  . Smoking status: Current Every Day Smoker    Packs/day: 1.00  . Smokeless tobacco: Never Used  Substance Use Topics  . Alcohol use: Yes    Comment: states that she drinks a couple drinks 2-3 times weekly  . Drug use: Yes    Comment: Heroin: Daily      Allergies   Patient has no known allergies.   Review of Systems Review of Systems  Unable to perform ROS: Other (intoxicated)     Physical Exam Updated Vital Signs BP 100/64   Pulse 82   Temp 97.9 F (36.6 C) (Oral)   Resp 18   SpO2 100%   Physical Exam Vitals signs and nursing note reviewed.  Constitutional:      General: She is not in acute distress.    Appearance: She is well-developed.     Comments: Mumbling, intoxicated.  HENT:     Head: Normocephalic and atraumatic.     Nose: Nose normal.  Eyes:     General: No scleral icterus.  Left eye: No discharge.     Conjunctiva/sclera: Conjunctivae normal.  Neck:     Musculoskeletal: Normal range of motion and neck supple.  Cardiovascular:     Rate and Rhythm: Normal rate and regular rhythm.     Heart sounds: Normal heart sounds. No murmur. No friction rub. No gallop.   Pulmonary:     Effort: Pulmonary effort is normal. No respiratory distress.     Breath sounds: Normal breath sounds.  Abdominal:     General: Bowel sounds are normal. There is no distension.     Palpations: Abdomen is soft.     Tenderness: There is no abdominal tenderness. There is no guarding.  Musculoskeletal: Normal range of motion.  Skin:    General: Skin is warm and dry.     Findings: No  rash.     Comments: Well healing surgical scar on R clavicle area.  Neurological:     General: No focal deficit present.     Mental Status: She is alert.     Cranial Nerves: No cranial nerve deficit.     Sensory: No sensory deficit.     Motor: No weakness or abnormal muscle tone.     Coordination: Coordination normal.      ED Treatments / Results  Labs (all labs ordered are listed, but only abnormal results are displayed) Labs Reviewed  COMPREHENSIVE METABOLIC PANEL - Abnormal; Notable for the following components:      Result Value   Sodium 147 (*)    Potassium 3.3 (*)    Calcium 8.3 (*)    Total Protein 6.0 (*)    All other components within normal limits  ETHANOL - Abnormal; Notable for the following components:   Alcohol, Ethyl (B) 508 (*)    All other components within normal limits  CBC WITH DIFFERENTIAL/PLATELET - Abnormal; Notable for the following components:   WBC 3.4 (*)    Neutro Abs 1.5 (*)    All other components within normal limits  ACETAMINOPHEN LEVEL - Abnormal; Notable for the following components:   Acetaminophen (Tylenol), Serum <10 (*)    All other components within normal limits  LIPASE, BLOOD  SALICYLATE LEVEL  RAPID URINE DRUG SCREEN, HOSP PERFORMED  URINALYSIS, ROUTINE W REFLEX MICROSCOPIC  I-STAT BETA HCG BLOOD, ED ([facility], [facility], [facility] ONLY)    EKG EKG Interpretation  Date/Time:  Thursday December 29 2018 11:04:38 EDT Ventricular Rate:  80 PR Interval:    QRS Duration: 90 QT Interval:  409 QTC Calculation: 472 R Axis:   83 Text Interpretation:  Sinus rhythm Confirmed by Lacretia Leigh ([id number]) on 12/29/2018 11:14:44 AM Also confirmed by Lacretia Leigh ([id number]), editor Philomena Doheny [phone])  on 12/29/2018 12:18:15 PM   Radiology Ct Head Wo Contrast  Result Date: 12/29/2018 CLINICAL DATA:  Found laying in the driveway.  Intoxicated. EXAM: CT HEAD WITHOUT CONTRAST TECHNIQUE: Contiguous axial images were obtained from the base of the skull through the  vertex without intravenous contrast. COMPARISON:  CT head dated December 05, 2018. FINDINGS: Brain: No evidence of acute infarction, hemorrhage, hydrocephalus, extra-axial collection or mass lesion/mass effect. Vascular: No hyperdense vessel or unexpected calcification. Skull: Normal. Negative for fracture or focal lesion. Sinuses/Orbits: No acute finding. Other: None. IMPRESSION: Normal noncontrast head CT. Electronically Signed   By: Titus Dubin M.D.   On: 12/29/2018 12:09    Procedures Procedures (including critical care time)  Medications Ordered in ED Medications  potassium chloride SA (K-DUR) CR tablet 40 mEq (has no administration  in time range)  lactated ringers bolus 1,000 mL (0 mLs Intravenous Stopped 12/29/18 1302)  lactated ringers bolus 1,000 mL (0 mLs Intravenous Stopped 12/29/18 1501)     Initial Impression / Assessment and Plan / ED Course  I have reviewed the triage vital signs and the nursing notes.  Pertinent labs & imaging results that were available during my care of the patient were reviewed by me and considered in my medical decision making (see chart for details).  Clinical Course as of Dec 29 1522  Thu Dec 29, 2018  1345 I spoke to patient's mother, Sheila Graves.  She is concerned because the patient has had worsening depression, worsening of alcohol abuse and possibly substance abuse with opioids.  States that she did have an episode where she was able to successfully detoxed from alcohol.  However, she is concerned because "it does not usually last that long."  She noted that her daughter did verbalize some suicidal ideation.  Her roommate found her laying face down in the driveway this morning which is why she came to the ED.  Patient herself is denying all SI, HI but her alcohol level is 508 so will wait for her to metabolize and become clinically sober.   [HK]  1523 Mother's phone number [phone]   [HK]    Clinical Course User Index [HK] Dietrich PatesKhatri, Shivani Barrantes, PA-C        38 year old female past medical history of alcohol abuse presents to ED for altered mental status.  Additional history is provided by mother above.  She is concerned about worsening depression, suicidal ideation.  Patient denies this. She appears intoxicated, her alcohol level is 508.  She is alert, irritated.  EKG shows sinus rhythm.  CT of the head is unremarkable.  Potassium 3.3 which was repleted orally.  Patient given IV fluids to help metabolize.  She will ultimately need TTS evaluation after she is clinically sober. Behavioral specialist unsure if IVC is required as SI only vocalized by mother and patient is denying, although she is intoxicated.  Final Clinical Impressions(s) / ED Diagnoses   Final diagnoses:  Alcoholic intoxication without complication [facility](HCC)  Suicidal ideation    ED Discharge Orders    None         Dietrich PatesKhatri, Nadalee Neiswender, [place]PA-C 12/29/18 1631

## 2018-12-29 NOTE — ED Notes (Signed)
Patient refused to give a urine spec. Placed a purewick however patient pulled it off and threw it in the floor.

## 2019-01-18 ENCOUNTER — Other Ambulatory Visit: Payer: Self-pay

## 2019-01-18 ENCOUNTER — Encounter (HOSPITAL_COMMUNITY): Payer: Self-pay | Admitting: Behavioral Health

## 2019-01-18 ENCOUNTER — Emergency Department (HOSPITAL_COMMUNITY)
Admission: EM | Admit: 2019-01-18 | Discharge: 2019-01-18 | Disposition: A | Discharge status: Home / Self Care | Payer: Self-pay | Attending: Emergency Medicine | Admitting: Emergency Medicine

## 2019-01-18 DIAGNOSIS — F10120 Alcohol abuse with intoxication, uncomplicated: Secondary | ICD-10-CM | POA: Insufficient documentation

## 2019-01-18 DIAGNOSIS — E872 Acidosis: Secondary | ICD-10-CM

## 2019-01-18 DIAGNOSIS — F17200 Nicotine dependence, unspecified, uncomplicated: Secondary | ICD-10-CM | POA: Insufficient documentation

## 2019-01-18 DIAGNOSIS — F332 Major depressive disorder, recurrent severe without psychotic features: Secondary | ICD-10-CM | POA: Insufficient documentation

## 2019-01-18 DIAGNOSIS — F1010 Alcohol abuse, uncomplicated: Secondary | ICD-10-CM

## 2019-01-18 DIAGNOSIS — E8729 Other acidosis: Secondary | ICD-10-CM

## 2019-01-18 DIAGNOSIS — F10920 Alcohol use, unspecified with intoxication, uncomplicated: Secondary | ICD-10-CM

## 2019-01-18 DIAGNOSIS — R079 Chest pain, unspecified: Secondary | ICD-10-CM | POA: Insufficient documentation

## 2019-01-18 LAB — CBC WITH DIFFERENTIAL/PLATELET
Abs Immature Granulocytes: 0.03 10*3/uL (ref 0.00–0.07)
Basophils Absolute: 0.1 10*3/uL (ref 0.0–0.1)
Basophils Relative: 1 %
Eosinophils Absolute: 0 10*3/uL (ref 0.0–0.5)
Eosinophils Relative: 0 %
HCT: 47.8 % — ABNORMAL HIGH (ref 36.0–46.0)
Hemoglobin: 15.8 g/dL — ABNORMAL HIGH (ref 12.0–15.0)
Immature Granulocytes: 0 %
Lymphocytes Relative: 33 %
Lymphs Abs: 3.1 10*3/uL (ref 0.7–4.0)
MCH: 32.7 pg (ref 26.0–34.0)
MCHC: 33.1 g/dL (ref 30.0–36.0)
MCV: 99 fL (ref 80.0–100.0)
Monocytes Absolute: 0.4 10*3/uL (ref 0.1–1.0)
Monocytes Relative: 4 %
Neutro Abs: 6 10*3/uL (ref 1.7–7.7)
Neutrophils Relative %: 62 %
Platelets: 288 10*3/uL (ref 150–400)
RBC: 4.83 MIL/uL (ref 3.87–5.11)
RDW: 13 % (ref 11.5–15.5)
WBC: 9.6 10*3/uL (ref 4.0–10.5)
nRBC: 0 % (ref 0.0–0.2)

## 2019-01-18 LAB — COMPREHENSIVE METABOLIC PANEL
ALT: 15 U/L (ref 0–44)
ALT: 12 U/L (ref 0–44)
AST: 29 U/L (ref 15–41)
AST: 22 U/L (ref 15–41)
Albumin: 4.3 g/dL (ref 3.5–5.0)
Albumin: 3.2 g/dL — ABNORMAL LOW (ref 3.5–5.0)
Alkaline Phosphatase: 77 U/L (ref 38–126)
Alkaline Phosphatase: 56 U/L (ref 38–126)
Anion gap: 23 — ABNORMAL HIGH (ref 5–15)
Anion gap: 10 (ref 5–15)
BUN: 15 mg/dL (ref 6–20)
BUN: 12 mg/dL (ref 6–20)
CO2: 18 mmol/L — ABNORMAL LOW (ref 22–32)
CO2: 22 mmol/L (ref 22–32)
Calcium: 8.3 mg/dL — ABNORMAL LOW (ref 8.9–10.3)
Calcium: 7.4 mg/dL — ABNORMAL LOW (ref 8.9–10.3)
Chloride: 101 mmol/L (ref 98–111)
Chloride: 110 mmol/L (ref 98–111)
Creatinine, Ser: 0.74 mg/dL (ref 0.44–1.00)
Creatinine, Ser: 0.77 mg/dL (ref 0.44–1.00)
GFR calc Af Amer: >60 mL/min (ref >60)
GFR calc Af Amer: >60 mL/min (ref >60)
GFR calc non Af Amer: >60 mL/min (ref >60)
GFR calc non Af Amer: >60 mL/min (ref >60)
Glucose, Bld: 84 mg/dL (ref 70–99)
Glucose, Bld: 93 mg/dL (ref 70–99)
Potassium: 3.3 mmol/L — ABNORMAL LOW (ref 3.5–5.1)
Potassium: 3.2 mmol/L — ABNORMAL LOW (ref 3.5–5.1)
Sodium: 142 mmol/L (ref 135–145)
Sodium: 142 mmol/L (ref 135–145)
Total Bilirubin: 0.5 mg/dL (ref 0.3–1.2)
Total Bilirubin: 0.3 mg/dL (ref 0.3–1.2)
Total Protein: 7.1 g/dL (ref 6.5–8.1)
Total Protein: 5.6 g/dL — ABNORMAL LOW (ref 6.5–8.1)

## 2019-01-18 LAB — BLOOD GAS, VENOUS
Acid-base deficit: 3.2 mmol/L — ABNORMAL HIGH (ref 0.0–2.0)
Bicarbonate: 23.4 mmol/L (ref 20.0–28.0)
FIO2: 21
O2 Saturation: 71.9 %
Patient temperature: 98.6
pCO2, Ven: 49.1 mmHg (ref 44.0–60.0)
pH, Ven: 7.299 (ref 7.250–7.430)
pO2, Ven: 51.8 mmHg — ABNORMAL HIGH (ref 32.0–45.0)

## 2019-01-18 LAB — RAPID URINE DRUG SCREEN, HOSP PERFORMED
Amphetamines: NOT DETECTED
Barbiturates: NOT DETECTED
Benzodiazepines: NOT DETECTED
Cocaine: NOT DETECTED
Opiates: POSITIVE — AB
Tetrahydrocannabinol: NOT DETECTED

## 2019-01-18 LAB — URINALYSIS, ROUTINE W REFLEX MICROSCOPIC
Bilirubin Urine: NEGATIVE
Glucose, UA: NEGATIVE mg/dL
Ketones, ur: 5 mg/dL — AB
Leukocytes,Ua: NEGATIVE
Nitrite: NEGATIVE
Protein, ur: 30 mg/dL — AB
Specific Gravity, Urine: 1.01 (ref 1.005–1.030)
pH: 5 (ref 5.0–8.0)

## 2019-01-18 LAB — AMMONIA: Ammonia: 40 umol/L — ABNORMAL HIGH (ref 9–35)

## 2019-01-18 LAB — ETHANOL: Alcohol, Ethyl (B): 515 mg/dL (ref <10)

## 2019-01-18 MED ORDER — SODIUM CHLORIDE 0.9 % IV SOLN
Freq: Once | INTRAVENOUS | Status: AC
  Administered 2019-01-18: 18:00:00 via INTRAVENOUS

## 2019-01-18 MED ORDER — SODIUM CHLORIDE 0.9 % IV BOLUS
1000.0000 mL | Freq: Once | INTRAVENOUS | Status: AC
  Administered 2019-01-18: 1000 mL via INTRAVENOUS

## 2019-01-18 MED ORDER — HALOPERIDOL LACTATE 5 MG/ML IJ SOLN: 5.0000 mg | Freq: Two times a day (BID) | INTRAMUSCULAR | Status: DC | PRN

## 2019-01-18 MED ORDER — CHLORDIAZEPOXIDE HCL 25 MG PO CAPS: ORAL_CAPSULE | ORAL | 0 refills | Status: DC

## 2019-01-18 NOTE — ED Notes (Signed)
Patient has provided verbal consent to allow RN to update patient wife on current status and medical updates.   Wife -        Jetaun Colbath Cell-         [phone] House-     [phone]

## 2019-01-18 NOTE — ED Provider Notes (Signed)
[facility]-EMERGENCY DEPT Provider Note   CSN: [id number] Arrival date & time: 01/18/19  1239    History   Chief Complaint No chief complaint on file.   HPI Sheila Graves is a 38 y.o. female with history of EtOH abuse brought to the ER by EMS for evaluation of altered mental status.  Level 5 caveat due to acuity of condition, possible intoxication.  Patient is arousable to painful stimuli only, quickly falls asleep and unable to provide history.  Speech is slurred.  Per EMT/EMS they were called out for patient being found on the ground in fetal position, not responding.  Spouse called 911 and police.  On route CBG was normal, SBP was in the 90s.  No report of head trauma or assault.  Unknown of other coingestants.  Will attempt to call family to obtain collateral information.     HPI  Past Medical History:  Diagnosis Date  . Alcohol withdrawal seizure (HCC)   . ETOH abuse   . Hypokalemia   . Pancreatitis   . Thyroid disease     Patient Active Problem List   Diagnosis Date Noted  . Fracture of clavicle, right, open 11/11/2018  . Alcohol dependence with withdrawal (HCC) 01/31/2018  . 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 FINGER SURGERY    . IRRIGATION AND DEBRIDEMENT SHOULDER Right 11/12/2018   Procedure: Irrigation And Debridement Shoulder;  Surgeon: Roby LoftsHaddix, Kevin P, MD;  Location: [facility] OR;  Service: Orthopedics;  Laterality: Right;  . ORIF CLAVICULAR FRACTURE Right 11/12/2018   Procedure: OPEN REDUCTION INTERNAL FIXATION (ORIF) CLAVICULAR FRACTURE;  Surgeon: Roby LoftsHaddix, Kevin P, MD;  Location: [facility] OR;  Service: Orthopedics;  Laterality: Right;     OB History   No obstetric history on file.      Home Medications    Prior to Admission medications   Medication Sig Start Date End Date Taking? Authorizing Provider  acetaminophen (TYLENOL) 500 MG tablet  Take 1 tablet (500 mg total) by mouth every 12 (twelve) hours. Patient not taking: Reported on 01/18/2019 11/12/18   Despina HiddenYacobi, Sarah A, PA-C    Family History No family history on file.  Social History Social History   Tobacco Use  . Smoking status: Current Every Day Smoker    Packs/day: 1.00  . Smokeless tobacco: Never Used  Substance Use Topics  . Alcohol use: Yes    Comment: states that she drinks a couple drinks 2-3 times weekly  . Drug use: Yes    Comment: Heroin: Daily      Allergies   Patient has no known allergies.   Review of Systems Review of Systems  Unable to perform ROS: Mental status change  All other systems reviewed and are negative.    Physical Exam Updated Vital Signs BP 103/65 (BP Location: Right Arm)   Pulse 96   Temp 97.6 F (36.4 C) (Oral)   Resp 15   SpO2 100%   Physical Exam Vitals signs and nursing note reviewed.  Constitutional:      Appearance: She is well-developed.     Comments: NAD.  HENT:     Head: Normocephalic and atraumatic.     Comments: No signs of trauma to the scalp or face bones.      Right Ear: External ear normal.     Left Ear: External ear normal.     Nose: Nose normal.  Mouth/Throat:     Comments: Dry lips but moist mucous membranes.  Patient able to open mouth and stick tongue out, no signs of tongue or intraoral injury Eyes:     General: No scleral icterus.    Conjunctiva/sclera: Conjunctivae normal.  Neck:     Musculoskeletal: Normal range of motion and neck supple.     Comments: Patient spontaneously moving head and neck without difficulty.  No obvious wincing or withdrawal with palpation of the midline C-spine or paraspinal muscles. Cardiovascular:     Rate and Rhythm: Normal rate and regular rhythm.     Pulses:          Radial pulses are 1+ on the right side and 1+ on the left side.       Dorsalis pedis pulses are 1+ on the right side and 1+ on the left side.     Heart sounds: Normal heart sounds.   Pulmonary:     Effort: Pulmonary effort is normal.     Breath sounds: Normal breath sounds.     Comments: SPO2 greater than 95% on room air Abdominal:     General: Abdomen is flat.     Palpations: Abdomen is soft.  Musculoskeletal: Normal range of motion.        General: No deformity.     Comments: Inspection of upper and lower extremities without signs of trauma.  Passive range of motion of upper and lower extremities without obvious signs of pain.  Skin:    General: Skin is warm and dry.     Capillary Refill: Capillary refill takes less than 2 seconds.  Neurological:     Mental Status: She is lethargic and disoriented.     Comments: Symmetric reactive 3 mm round pupils. Able to follow simple commands like open mouth, hand grip.  Can tell me her name.  Symmetric handgrip bilaterally.  Patient unable to follow any other commands.  Unable to complete further neuro exam. Patella DTR symmetric bilaterally.  Psychiatric:        Behavior: Behavior normal.        Thought Content: Thought content normal.        Judgment: Judgment normal.      ED Treatments / Results  Labs (all labs ordered are listed, but only abnormal results are displayed) Labs Reviewed  CBC WITH DIFFERENTIAL/PLATELET - Abnormal; Notable for the following components:      Result Value   Hemoglobin 15.8 (*)    HCT 47.8 (*)    All other components within normal limits  COMPREHENSIVE METABOLIC PANEL - Abnormal; Notable for the following components:   Potassium 3.3 (*)    CO2 18 (*)    Calcium 8.3 (*)    Anion gap 23 (*)    All other components within normal limits  ETHANOL - Abnormal; Notable for the following components:   Alcohol, Ethyl (B) 515 (*)    All other components within normal limits  BLOOD GAS, VENOUS - Abnormal; Notable for the following components:   pO2, Ven 51.8 (*)    Acid-base deficit 3.2 (*)    All other components within normal limits  AMMONIA  RAPID URINE DRUG SCREEN, HOSP PERFORMED   URINALYSIS, ROUTINE W REFLEX MICROSCOPIC  I-STAT BETA HCG BLOOD, ED ([facility], [facility], [facility] ONLY)    EKG None  Radiology No results found.  Procedures Procedures (including critical care time)  Medications Ordered in ED Medications  sodium chloride 0.9 % bolus 1,000 mL (1,000 mLs Intravenous New Bag/Given  01/18/19 1315)     Initial Impression / Assessment and Plan / ED Course  I have reviewed the triage vital signs and the nursing notes.  Pertinent labs & imaging results that were available during my care of the patient were reviewed by me and considered in my medical decision making (see chart for details).  Clinical Course as of Jan 17 1446  Wed Jan 18, 2019  1425 Potassium(!): 3.3 [CG]  1425 Alcohol, Ethyl (B)(!!): 515 [CG]  1426 Likely 2/2 ETOH, VBG unremarkable  Anion gap(!): 23 [CG]  1430 Reevaluated patient.  She is now arousable to voice.  She can tell me her full name.  Admits to drinking alcohol.  States she lives with her wife Leotis ShamesLauren.  She denies SI. No pain anywhere. She gave me permission to call her.  I attempted to call [name] x2 but unable to contact.   [CG]    Clinical Course User Index [CG] Liberty HandyGibbons, Eisha Chatterjee J, PA-C    Highest on DDX is EtOH intoxication leading to AMS.  Unfortunately, patient has had frequent ER visits presenting similarly.  Last 2 UDS clean however 6 months ago she was seen in the ER for heroin use, opiate overdose also differential but less likely.  Patient placed on end-tidal CO2, cardiac monitor, continuous pulse ox.  She will be closely monitored.  Lab work including EtOH pending.  I do not think head CT is indicated today as she has no signs of head trauma and there is high suspicion for EtOH with no report of fall or head injury.  ER work-up reviewed by me remarkable for EtOH 515, K3.3.  Patient reevaluated, she is now more arousable and can stay awake for longer period of time.  On re-evaluation CN and strength/sensation intact bilaterally,  speech now more clear. Doubt CNS cause such as CVA, ICH. She can now provide some history and states that she did consume alcohol today but denies any other illicit drugs.  She denies attempts to harm herself, SI.  She given permission to call her wife Leotis ShamesLauren but unable to contact her.  Given clinical improvement, will plan on giving IV fluids, reassess with hopes of metabolization and discharge.  1500: RN notified me patient's wife has filled out IVC paperwork, at bedside. I am unable to contact wife. Given ETOH 515 patient warrant metabolization, reassessment before med cleared to participate in TTS evaluation.  Final Clinical Impressions(s) / ED Diagnoses   Final diagnoses:  ETOH abuse  Alcoholic intoxication without complication [facility](HCC)    ED Discharge Orders    None       Liberty HandyGibbons, Ellen Goris J, PA-C 01/18/19 1501    Derwood KaplanNanavati, Ankit, MD 01/22/19 1440

## 2019-01-18 NOTE — ED Notes (Signed)
Patient provided sandwich, snacks and soda for meal.

## 2019-01-18 NOTE — BH Assessment (Signed)
Tele Assessment Note   Patient Name: Sheila Graves MRN: [id number] Referring Physician: B. Lacinda Axon, MD Location of Patient: [facility] Location of Provider: [place] Department  Sheila Graves is an 38 y.o. female who presented to [facility] under IVC (petitioner is [name]) with complaint of intoxication.  Her BAC on admission was 515.  Pt lives in [place] with wife and mother in [place].  Pt is unemployed, and she is not followed by any outpatient services.  Pt was last assessed by TTS on December 29, 2018 due to intoxication.  History collected from Pt and Pt's wife.  Pt reported that she drank too much vodka, that she drinks daily, and that she is interested in rehab services.  Pt denied suicidal ideation or past suicide attempts, homicidal ideation, hallucination, and self-injurious behavior.  She reported that she attended detox once before, but she does not remember where.  Author spoke with [name] Graves.  Sheila reported that Pt drinks daily to intoxication.  Once intoxicated, she becomes belligerent and impulsive (for example, tries to drive).  Wife stated that yesterday, when confronted, Pt made a statement suggesting that she had intentionally overdosed on medication.  Wife took this to be a suicide threat.  Wife stated that Pt frequently makes suicidal statements.    During assessment, Pt presented as alert and oriented.  She had good eye contact and ws cooperative.  Demeanor was calm.  Pt's mood and affect were pleasant.  Pt expressed interest in rehab services.  Pt's speech was normal in rate, rhythm, and volume.  Thought processes were within normal range, and thought content was logical and goal-oriented.  There was no evidence of delusion.  Pt's memory and concentration were intact.  Insight, judgment, and impulse control were poor as evidenced by continued drinking.  Consulted with Peri Maris, DO, who determined that Pt may be psych-cleared.  Recommended discharge with outpatient  services.  Diagnosis: Alcohol Use Disorder, Severe  Past Medical History:  Past Medical History:  Diagnosis Date  . Alcohol withdrawal seizure ([place])   . ETOH abuse   . Hypokalemia   . Pancreatitis   . Thyroid disease     Past Surgical History:  Procedure Laterality Date  . FINGER SURGERY    . IRRIGATION AND DEBRIDEMENT SHOULDER Right 11/12/2018   Procedure: Irrigation And Debridement Shoulder;  Surgeon: Shona Needles, MD;  Location: [facility];  Service: Orthopedics;  Laterality: Right;  . ORIF CLAVICULAR FRACTURE Right 11/12/2018   Procedure: OPEN REDUCTION INTERNAL FIXATION (ORIF) CLAVICULAR FRACTURE;  Surgeon: Shona Needles, MD;  Location: [facility];  Service: Orthopedics;  Laterality: Right;    Family History: No family history on file.  Social History:  reports that she has been smoking. She has been smoking about 1.00 pack per day. She has never used smokeless tobacco. She reports current alcohol use. She reports previous drug use. Drug: Heroin.  Additional Social History:  Alcohol / Drug Use Pain Medications: See MAR Prescriptions: See MAR Over the Counter: See MAR History of alcohol / drug use?: Yes Substance #1 Name of Substance 1: Alcohol 1 - Amount (size/oz): Varied 1 - Frequency: Daily 1 - Duration: Ongoing 1 - Last Use / Amount: 01/17/2019 -- Pt's BAC on admission was 515  CIWA: CIWA-Ar BP: 112/76 Pulse Rate: 89 Nausea and Vomiting: no nausea and no vomiting Tactile Disturbances: none Tremor: no tremor Auditory Disturbances: not present Paroxysmal Sweats: no sweat visible Visual Disturbances: not present Anxiety: no anxiety, at ease  Headache, Fullness in Head: none present Agitation: normal activity Orientation and Clouding of Sensorium: oriented and can do serial additions CIWA-Ar Total: 0 COWS:    Allergies: No Known Allergies  Home Medications: (Not in a hospital admission)   OB/GYN Status:  No LMP recorded.  General Assessment Data Location of  Assessment: [facility] TTS Assessment: In system Is this a Tele or Face-to-Face Assessment?: Tele Assessment Is this an Initial Assessment or a Re-assessment for this encounter?: Initial Assessment Patient Accompanied by:: N/A Language Other than English: No Living Arrangements: Other (Comment) What gender do you identify as?: Female Marital status: Married Pregnancy Status: No Living Arrangements: Spouse/significant other Can pt return to current living arrangement?: Yes Admission Status: Involuntary Petitioner: Family member([name]) Is patient capable of signing voluntary admission?: Yes Referral Source: Self/Family/Friend Insurance type: None     Crisis Care Plan Living Arrangements: Spouse/significant other Name of Psychiatrist: None Name of Therapist: None  Education Status Is patient currently in school?: No Is the patient employed, unemployed or receiving disability?: Unemployed  Risk to self with the past 6 months Suicidal Ideation: No(Pt denied; see notes) Has patient been a risk to self within the past 6 months prior to admission? : No Suicidal Intent: No Has patient had any suicidal intent within the past 6 months prior to admission? : No Is patient at risk for suicide?: No Suicidal Plan?: No Has patient had any suicidal plan within the past 6 months prior to admission? : No Access to Means: No What has been your use of drugs/alcohol within the last 12 months?: Alcohol Previous Attempts/Gestures: No Intentional Self Injurious Behavior: None Family Suicide History: No Recent stressful life event(s): Other (Comment)(Continued alcohol use) Persecutory voices/beliefs?: No Depression: Yes Depression Symptoms: Loss of interest in usual pleasures, Isolating Substance abuse history and/or treatment for substance abuse?: No Suicide prevention information given to non-admitted patients: Not applicable  Risk to Others within the past 6 months Homicidal Ideation:  No Does patient have any lifetime risk of violence toward others beyond the six months prior to admission? : No Thoughts of Harm to Others: No Current Homicidal Intent: No Current Homicidal Plan: No Access to Homicidal Means: No History of harm to others?: No Assessment of Violence: None Noted Does patient have access to weapons?: No Criminal Charges Pending?: No Does patient have a court date: No Is patient on probation?: No  Psychosis Hallucinations: None noted Delusions: None noted  Mental Status Report Appearance/Hygiene: Unremarkable, In scrubs Eye Contact: Good Motor Activity: Unremarkable, Freedom of movement Speech: Logical/coherent Level of Consciousness: Alert Mood: Preoccupied Affect: Appropriate to circumstance Anxiety Level: None Thought Processes: Relevant, Coherent Judgement: Partial Orientation: Person, Place, Situation, Time Obsessive Compulsive Thoughts/Behaviors: None  Cognitive Functioning Concentration: Normal Memory: Recent Intact, Remote Intact Is patient IDD: No Insight: Poor Impulse Control: Poor Appetite: Fair Have you had any weight changes? : No Change Sleep: Decreased Total Hours of Sleep: 5 Vegetative Symptoms: None  ADLScreening [facility] Assessment Services) Patient's cognitive ability adequate to safely complete daily activities?: Yes Patient able to express need for assistance with ADLs?: Yes Independently performs ADLs?: Yes (appropriate for developmental age)  Prior Inpatient Therapy Prior Inpatient Therapy: Yes Prior Therapy Dates: unknown Prior Therapy Facilty/Provider(s): unknown Reason for Treatment: SA  Prior Outpatient Therapy Prior Outpatient Therapy: No Does patient have an ACCT team?: No Does patient have Intensive In-House Services?  : No Does patient have [facility] services? : No Does patient have P4CC services?: No  ADL Screening (condition at  time of admission) Patient's cognitive ability adequate to safely  complete daily activities?: Yes Is the patient deaf or have difficulty hearing?: No Does the patient have difficulty seeing, even when wearing glasses/contacts?: No Does the patient have difficulty concentrating, remembering, or making decisions?: No Patient able to express need for assistance with ADLs?: Yes Does the patient have difficulty dressing or bathing?: No Independently performs ADLs?: Yes (appropriate for developmental age) Does the patient have difficulty walking or climbing stairs?: No Weakness of Legs: None Weakness of Arms/Hands: None  Home Assistive Devices/Equipment Home Assistive Devices/Equipment: None  Therapy Consults (therapy consults require a physician order) PT Evaluation Needed: No OT Evalulation Needed: No SLP Evaluation Needed: No Abuse/Neglect Assessment (Assessment to be complete while patient is alone) Physical Abuse: Denies Verbal Abuse: Denies Sexual Abuse: Denies Exploitation of patient/patient's resources: Denies Values / Beliefs Cultural Requests During Hospitalization: None Spiritual Requests During Hospitalization: None Consults Spiritual Care Consult Needed: No Social Work Consult Needed: No Merchant navy officerAdvance Directives (For Healthcare) Does Patient Have a Medical Advance Directive?: No Would patient like information on creating a medical advance directive?: No - Patient declined          Disposition:  Disposition Initial Assessment Completed for this Encounter: Yes Disposition of Patient: Discharge(Per Narda AmberJ. Norman, DO, Pt is psych-cleared)  This service was provided via telemedicine using a 2-way, interactive audio and Immunologistvideo technology.  Names of all persons participating in this telemedicine service and their role in this encounter. Name:Sheila Graves Role: Patient  Name: Barbera SettersLauren Graves Role: Pt's wife          Earline Mayotteugene T Jalayah Gutridge 01/18/2019 6:55 PM

## 2019-01-18 NOTE — ED Provider Notes (Signed)
Patient is awake, alert, and no longer intoxicated.  She is not suicidal at this time.  That appears to be more when she was intoxicated and I do not think she needs to be involuntarily committed.  This has been reversed.   Sherwood Gambler, MD 01/18/19 [phone]

## 2019-01-18 NOTE — ED Provider Notes (Signed)
Care transferred from [place].  See note for full HPI  Patient presents with altered mental status.  Patient with history of alcohol abuse.  Initially patient only arousable to painful stimulation and quickly falls asleep.  Patient with improvement in exam however still unable to participate in psych evaluation.  She was IVC, per previous provider due to voicing suicidal ideations earlier today.  Plan for reevaluation once sober and TTS consult.  Physical Exam  BP 99/65   Pulse (!) 106   Temp 97.6 F (36.4 C) (Oral)   Resp 16   Ht (S) 5\' 11"  (1.803 m)   Wt (S) 61 kg   SpO2 94%   BMI 18.76 kg/m   Physical Exam Vitals signs and nursing note reviewed.  Constitutional:      General: She is not in acute distress.    Appearance: She is well-developed. She is not diaphoretic.  HENT:     Head: Normocephalic and atraumatic.  Eyes:     Pupils: Pupils are equal, round, and reactive to light.  Neck:     Musculoskeletal: Normal range of motion and neck supple.  Cardiovascular:     Rate and Rhythm: Normal rate and regular rhythm.     Pulses: Normal pulses.     Heart sounds: Normal heart sounds.  Pulmonary:     Effort: Pulmonary effort is normal. No respiratory distress.     Breath sounds: Normal breath sounds and air entry.  Abdominal:     General: Bowel sounds are normal. There is no distension.     Palpations: Abdomen is soft.     Tenderness: There is no abdominal tenderness.  Musculoskeletal: Normal range of motion.     Comments: Moves all 4 extremities without difficulty.  Skin:    General: Skin is warm and dry.  Neurological:     General: No focal deficit present.     Mental Status: She is alert.     Sensory: Sensation is intact.     Motor: Motor function is intact.     Coordination: Coordination is intact.     Gait: Gait is intact.  Psychiatric:        Attention and Perception: Attention normal.        Mood and Affect: Mood normal.        Thought Content: Thought  content normal. Thought content is not paranoid or delusional. Thought content does not include homicidal or suicidal ideation. Thought content does not include homicidal or suicidal plan.    ED Course/Procedures   Clinical Course as of Jan 17 2250  Wed Jan 18, 2019  1425 Potassium(!): 3.3 [CG]  1425 Alcohol, Ethyl (B)(!!): 515 [CG]  1426 Likely 2/2 ETOH, VBG unremarkable  Anion gap(!): 23 [CG]  1430 Reevaluated patient.  She is now arousable to voice.  She can tell me her full name.  Admits to drinking alcohol.  States she lives with her wife Leotis ShamesLauren.  She denies SI. No pain anywhere. She gave me permission to call her.  I attempted to call [name] x2 but unable to contact.   [CG]    Clinical Course User Index [CG] Liberty HandyGibbons, Claudia J, PA-C   Labs Reviewed  CBC WITH DIFFERENTIAL/PLATELET - Abnormal; Notable for the following components:      Result Value   Hemoglobin 15.8 (*)    HCT 47.8 (*)    All other components within normal limits  COMPREHENSIVE METABOLIC PANEL - Abnormal; Notable for the following components:   Potassium 3.3 (*)  CO2 18 (*)    Calcium 8.3 (*)    Anion gap 23 (*)    All other components within normal limits  ETHANOL - Abnormal; Notable for the following components:   Alcohol, Ethyl (B) 515 (*)    All other components within normal limits  AMMONIA - Abnormal; Notable for the following components:   Ammonia 40 (*)    All other components within normal limits  RAPID URINE DRUG SCREEN, HOSP PERFORMED - Abnormal; Notable for the following components:   Opiates POSITIVE (*)    All other components within normal limits  URINALYSIS, ROUTINE W REFLEX MICROSCOPIC - Abnormal; Notable for the following components:   APPearance HAZY (*)    Hgb urine dipstick MODERATE (*)    Ketones, ur 5 (*)    Protein, ur 30 (*)    Bacteria, UA MANY (*)    All other components within normal limits  BLOOD GAS, VENOUS - Abnormal; Notable for the following components:   pO2, Ven  51.8 (*)    Acid-base deficit 3.2 (*)    All other components within normal limits  COMPREHENSIVE METABOLIC PANEL - Abnormal; Notable for the following components:   Potassium 3.2 (*)    Calcium 7.4 (*)    Total Protein 5.6 (*)    Albumin 3.2 (*)    All other components within normal limits  I-STAT BETA HCG BLOOD, ED ([facility], [facility], [facility] ONLY)  Ct Head Wo Contrast  Result Date: 12/29/2018 CLINICAL DATA:  Found laying in the driveway.  Intoxicated. EXAM: CT HEAD WITHOUT CONTRAST TECHNIQUE: Contiguous axial images were obtained from the base of the skull through the vertex without intravenous contrast. COMPARISON:  CT head dated December 05, 2018. FINDINGS: Brain: No evidence of acute infarction, hemorrhage, hydrocephalus, extra-axial collection or mass lesion/mass effect. Vascular: No hyperdense vessel or unexpected calcification. Skull: Normal. Negative for fracture or focal lesion. Sinuses/Orbits: No acute finding. Other: None. IMPRESSION: Normal noncontrast head CT. Electronically Signed   By: Obie DredgeWilliam T Derry M.D.   On: 12/29/2018 12:09   Procedures MDM  38 year old female history of alcohol abuse presents for evaluation of altered mental status.  [facility]  Labs significant for alcohol of 515 with anion gap of 23.  Possible alcohol ketoacidosis.  She has been provided IV fluids and will order additional liter with plan to recheck anion gap. Denies toxic alcohol substance ingestion.  Patient now arousable to voice however still unable to talk with psychiatry per previous provider.  Plan for evaluation by psychiatry once sober and medically cleared.  She is currently under IVC.  On reevaluation patient sleeping, arousable by voice.  Admits to drinking "a lot of vodka."  Currently being administered IV fluids and will recheck to ensure gap is improving.  Patient will need TTS consult.  1900: Patient evaluated by psychiatry.  Psychiatry has determined that patient has been  psych cleared.  Recommend discharge with outpatient services.  Receiving IV fluids and repeat metabolic panel.  2200: Patient arousable by voice. Denies any intentional self harm. Denies SI, HI, AVH.  Repeat metabolic panel with normal anion gap, corrected with IV fluids. Ambulatory without difficulty. Tolerating PO. IVC reversed by attending Dr. Criss AlvineGoldston who has evaluated patient. Will home with resource for outpatient detox programs. Will also dc home with Librium taper.  The patient has been appropriately medically screened and/or stabilized in the ED. I have low suspicion for any other emergent medical condition which would require further screening,  evaluation or treatment in the ED or require inpatient management.  Patient is hemodynamically stable and in no acute distress.  Patient able to ambulate in department prior to ED.  Evaluation does not show acute pathology that would require ongoing or additional emergent interventions while in the emergency department or further inpatient treatment.  I have discussed the diagnosis with the patient and answered all questions.  Patient has no further complaints prior to discharge.  Patient is comfortable with plan discussed in room and is stable for discharge at this time.  I have discussed strict return precautions for returning to the emergency department.  Patient was encouraged to follow-up with PCP/specialist refer to at discharge.      Jered Heiny A, PA-C 01/18/19 2251    Sherwood Gambler, MD 01/19/19 1225

## 2019-01-18 NOTE — ED Notes (Signed)
Sitter at bedside.

## 2019-01-18 NOTE — ED Notes (Signed)
Patient has called RN "douchebag" and has informed RN that "I need a better answer than the doctor will come see me when he can and I want my fucking cell phone".

## 2019-01-18 NOTE — ED Provider Notes (Signed)
Physical Exam  BP 108/83 (BP Location: Right Arm)   Pulse (!) 108   Temp 97.6 F (36.4 C) (Oral)   Resp 13   SpO2 100%    Assumed care from Carmon Sails, PA-C at 1500. Briefly, the patient is a 38 y.o. female with PMHx of  has a past medical history of Alcohol withdrawal seizure ([place]), ETOH abuse, Hypokalemia, Pancreatitis, and Thyroid disease. here with alcohol intoxication, IVC after drinking heavily and threatening violence against self at home and her wife ultimately filed IVC petition.  Labs Reviewed  CBC WITH DIFFERENTIAL/PLATELET - Abnormal; Notable for the following components:      Result Value   Hemoglobin 15.8 (*)    HCT 47.8 (*)    All other components within normal limits  COMPREHENSIVE METABOLIC PANEL - Abnormal; Notable for the following components:   Potassium 3.3 (*)    CO2 18 (*)    Calcium 8.3 (*)    Anion gap 23 (*)    All other components within normal limits  ETHANOL - Abnormal; Notable for the following components:   Alcohol, Ethyl (B) 515 (*)    All other components within normal limits  AMMONIA - Abnormal; Notable for the following components:   Ammonia 40 (*)    All other components within normal limits  RAPID URINE DRUG SCREEN, HOSP PERFORMED - Abnormal; Notable for the following components:   Opiates POSITIVE (*)    All other components within normal limits  URINALYSIS, ROUTINE W REFLEX MICROSCOPIC - Abnormal; Notable for the following components:   APPearance HAZY (*)    Hgb urine dipstick MODERATE (*)    Ketones, ur 5 (*)    Protein, ur 30 (*)    Bacteria, UA MANY (*)    All other components within normal limits  BLOOD GAS, VENOUS - Abnormal; Notable for the following components:   pO2, Ven 51.8 (*)    Acid-base deficit 3.2 (*)    All other components within normal limits  I-STAT BETA HCG BLOOD, ED ([facility], [facility], [facility] ONLY)    Course of Care:    Physical Exam Vitals signs and nursing note reviewed.  Constitutional:      General: She is  not in acute distress.    Appearance: She is well-developed. She is not diaphoretic.     Comments: Somnolent but easily arousable.  HENT:     Head: Normocephalic and atraumatic.  Eyes:     General:        Right eye: No discharge.        Left eye: No discharge.     Conjunctiva/sclera: Conjunctivae normal.     Comments: EOMs normal to gross examination.  Neck:     Musculoskeletal: Normal range of motion.  Cardiovascular:     Rate and Rhythm: Normal rate and regular rhythm.  Pulmonary:     Comments: Breathing comfortably.  No audible wheeze or stridor. Abdominal:     General: There is no distension.  Musculoskeletal: Normal range of motion.  Skin:    General: Skin is warm and dry.  Neurological:     Mental Status: She is alert.     Comments: Cranial nerves intact to gross observation. Patient moves extremities without difficulty.  Psychiatric:     Comments: Somnolent, easily arousable. Oriented x 3.      ED Course/Procedures   Clinical Course as of Jan 18 1528  Wed Jan 18, 2019  1425 Potassium(!): 3.3 [CG]  1425 Alcohol, Ethyl (B)(!!): 515 [CG]  1426 Likely 2/2 ETOH, VBG unremarkable  Anion gap(!): 23 [CG]  1430 Reevaluated patient.  She is now arousable to voice.  She can tell me her full name.  Admits to drinking alcohol.  States she lives with her wife Leotis ShamesLauren.  She denies SI. No pain anywhere. She gave me permission to call her.  I attempted to call [name] x2 but unable to contact.   [CG]    Clinical Course User Index [CG] Liberty HandyGibbons, Claudia J, PA-C    Procedures  MDM   Patient IVC'ed. Will reassess by TTS when clinically sober.   On my reassessment of the patient, she is still somnolent but easily arousable.  Not yet clinically sober to speak with TTS.  Labwork demonstrating anion gap of 23 with CO2 of 18.  EtOH 515. Care signed out to [facility] CenterBritni Henderly, PA-C to reassess after more fluids.       Elisha PonderMurray, Kahiau Schewe B, PA-C 01/18/19 [id number]    Nanavati, Ankit,  MD 01/22/19 1452

## 2019-01-18 NOTE — Discharge Instructions (Signed)
Follow up outpatient for alcohol detox. You were provided with resources. I have given you a Librium taper if you chose to stop drinking alcohol to prevent withdrawal symptoms. Return for any new or worsening symptoms.

## 2019-01-18 NOTE — ED Notes (Signed)
..................  Patient is IVC'ed................Sheila Graves

## 2019-01-18 NOTE — ED Notes (Signed)
Date and time results received: 01/18/19 1400 (use smartphrase ".now" to insert current time)  Test: ETOH Critical Value: 515  Name of Provider Notified: Migdalia Dk.  Orders Received? Or Actions Taken?:

## 2019-01-18 NOTE — ED Notes (Signed)
Charge RN has allowed patient to make one phone call. Patient will then be changed out into purple scrubs.

## 2019-01-18 NOTE — ED Notes (Signed)
Pt. In burgundy scrubs.pt. has 1 belongings bag. Pt. Has 1 black jacket, 1 black t-shirt and 1 black shorts.pt. belongings locked up in [place] 16-18 on the RES A and B.

## 2019-01-18 NOTE — ED Notes (Signed)
Tele-Psych Machine is in room ready for TTS Consult.

## 2019-01-18 NOTE — ED Notes (Signed)
TTS is interviewing patient via Camera operator.

## 2019-01-23 ENCOUNTER — Emergency Department (HOSPITAL_COMMUNITY): Payer: Self-pay

## 2019-01-23 ENCOUNTER — Other Ambulatory Visit: Payer: Self-pay

## 2019-01-23 ENCOUNTER — Encounter (HOSPITAL_COMMUNITY): Payer: Self-pay | Admitting: Emergency Medicine

## 2019-01-23 ENCOUNTER — Emergency Department (HOSPITAL_COMMUNITY)
Admission: EM | Admit: 2019-01-23 | Discharge: 2019-01-23 | Disposition: A | Discharge status: Home / Self Care | Payer: Self-pay | Attending: Emergency Medicine | Admitting: Emergency Medicine

## 2019-01-23 DIAGNOSIS — K8020 Calculus of gallbladder without cholecystitis without obstruction: Secondary | ICD-10-CM | POA: Insufficient documentation

## 2019-01-23 DIAGNOSIS — M542 Cervicalgia: Secondary | ICD-10-CM | POA: Diagnosis not present

## 2019-01-23 DIAGNOSIS — R51 Headache: Secondary | ICD-10-CM | POA: Insufficient documentation

## 2019-01-23 DIAGNOSIS — R000 Tachycardia, unspecified: Secondary | ICD-10-CM | POA: Insufficient documentation

## 2019-01-23 DIAGNOSIS — F10129 Alcohol abuse with intoxication, unspecified: Secondary | ICD-10-CM | POA: Diagnosis not present

## 2019-01-23 DIAGNOSIS — F10920 Alcohol use, unspecified with intoxication, uncomplicated: Secondary | ICD-10-CM

## 2019-01-23 LAB — CBC WITH DIFFERENTIAL/PLATELET
Abs Immature Granulocytes: 0.01 10*3/uL (ref 0.00–0.07)
Basophils Absolute: 0 10*3/uL (ref 0.0–0.1)
Basophils Relative: 1 %
Eosinophils Absolute: 0 10*3/uL (ref 0.0–0.5)
Eosinophils Relative: 0 %
HCT: 40 % (ref 36.0–46.0)
Hemoglobin: 13.6 g/dL (ref 12.0–15.0)
Immature Granulocytes: 0 %
Lymphocytes Relative: 49 %
Lymphs Abs: 1.4 10*3/uL (ref 0.7–4.0)
MCH: 32.5 pg (ref 26.0–34.0)
MCHC: 34 g/dL (ref 30.0–36.0)
MCV: 95.5 fL (ref 80.0–100.0)
Monocytes Absolute: 0.2 10*3/uL (ref 0.1–1.0)
Monocytes Relative: 8 %
Neutro Abs: 1.2 10*3/uL — ABNORMAL LOW (ref 1.7–7.7)
Neutrophils Relative %: 42 %
Platelets: 188 10*3/uL (ref 150–400)
RBC: 4.19 MIL/uL (ref 3.87–5.11)
RDW: 12.9 % (ref 11.5–15.5)
WBC: 2.8 10*3/uL — ABNORMAL LOW (ref 4.0–10.5)
nRBC: 0 % (ref 0.0–0.2)

## 2019-01-23 LAB — COMPREHENSIVE METABOLIC PANEL
ALT: 15 U/L (ref 0–44)
AST: 27 U/L (ref 15–41)
Albumin: 3.9 g/dL (ref 3.5–5.0)
Alkaline Phosphatase: 67 U/L (ref 38–126)
Anion gap: 13 (ref 5–15)
BUN: 8 mg/dL (ref 6–20)
CO2: 25 mmol/L (ref 22–32)
Calcium: 8.4 mg/dL — ABNORMAL LOW (ref 8.9–10.3)
Chloride: 107 mmol/L (ref 98–111)
Creatinine, Ser: 0.96 mg/dL (ref 0.44–1.00)
GFR calc Af Amer: >60 mL/min (ref >60)
GFR calc non Af Amer: >60 mL/min (ref >60)
Glucose, Bld: 96 mg/dL (ref 70–99)
Potassium: 3.3 mmol/L — ABNORMAL LOW (ref 3.5–5.1)
Sodium: 145 mmol/L (ref 135–145)
Total Bilirubin: 0.7 mg/dL (ref 0.3–1.2)
Total Protein: 6.6 g/dL (ref 6.5–8.1)

## 2019-01-23 LAB — SALICYLATE LEVEL: Salicylate Lvl: <7 mg/dL (ref 2.8–30.0)

## 2019-01-23 LAB — I-STAT BETA HCG BLOOD, ED (MC, WL, AP ONLY): I-stat hCG, quantitative: <5 m[IU]/mL (ref <5)

## 2019-01-23 LAB — ACETAMINOPHEN LEVEL: Acetaminophen (Tylenol), Serum: <10 ug/mL — ABNORMAL LOW (ref 10–30)

## 2019-01-23 LAB — ETHANOL: Alcohol, Ethyl (B): 417 mg/dL (ref <10)

## 2019-01-23 LAB — LIPASE, BLOOD: Lipase: 20 U/L (ref 11–51)

## 2019-01-23 LAB — MAGNESIUM: Magnesium: 2.1 mg/dL (ref 1.7–2.4)

## 2019-01-23 MED ORDER — THIAMINE HCL 100 MG/ML IJ SOLN
Freq: Once | INTRAVENOUS | Status: AC
  Administered 2019-01-23: 18:00:00 via INTRAVENOUS
  Filled 2019-01-23: qty 1000

## 2019-01-23 MED ORDER — IOHEXOL 300 MG/ML  SOLN
100.0000 mL | Freq: Once | INTRAMUSCULAR | Status: AC | PRN
  Administered 2019-01-23: 20:00:00 100 mL via INTRAVENOUS

## 2019-01-23 MED ORDER — THIAMINE HCL 100 MG/ML IJ SOLN
100.0000 mg | Freq: Once | INTRAMUSCULAR | Status: AC
  Administered 2019-01-23: 17:00:00 100 mg via INTRAVENOUS
  Filled 2019-01-23: qty 2

## 2019-01-23 NOTE — ED Notes (Signed)
Date and time results received: 01/23/19  (use smartphrase ".now" to insert current time)  Test: alcolhol ethyl Critical Value: 417  Name of Provider Notified: Dr. Ralene Bathe notified at [place]  Orders Received? Or Actions Taken?Marland Kitchen No new orders at this time
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
## 2019-01-23 NOTE — ED Notes (Signed)
Patient transported to X-ray 

## 2019-01-23 NOTE — ED Notes (Signed)
Patient verbalizes understanding of discharge instructions. Opportunity for questioning and answers were provided. pt discharged from ED with spouse and work note.

## 2019-01-23 NOTE — ED Notes (Signed)
Wife at bedside.

## 2019-01-23 NOTE — ED Notes (Signed)
Patient transported to CT 
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
## 2019-01-23 NOTE — ED Notes (Addendum)
Wifes number [name]: [phone], this RN updated her per pts request.

## 2019-01-23 NOTE — ED Notes (Addendum)
Pt ambulatory around unit with no issues, pt given water and a [place] sandwich.
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
## 2019-01-23 NOTE — ED Notes (Signed)
[employer] at bedside 

## 2019-02-03 ENCOUNTER — Observation Stay (HOSPITAL_COMMUNITY)
Admission: EM | Admit: 2019-02-03 | Discharge: 2019-02-04 | Discharge status: Against Medical Advice | Payer: Self-pay | Attending: Internal Medicine | Admitting: Internal Medicine

## 2019-02-03 ENCOUNTER — Emergency Department (HOSPITAL_COMMUNITY): Payer: Self-pay

## 2019-02-03 ENCOUNTER — Other Ambulatory Visit: Payer: Self-pay

## 2019-02-03 ENCOUNTER — Encounter (HOSPITAL_COMMUNITY): Payer: Self-pay | Admitting: Emergency Medicine

## 2019-02-03 DIAGNOSIS — F17210 Nicotine dependence, cigarettes, uncomplicated: Secondary | ICD-10-CM | POA: Insufficient documentation

## 2019-02-03 DIAGNOSIS — E876 Hypokalemia: Secondary | ICD-10-CM | POA: Insufficient documentation

## 2019-02-03 DIAGNOSIS — R4189 Other symptoms and signs involving cognitive functions and awareness: Secondary | ICD-10-CM

## 2019-02-03 DIAGNOSIS — I959 Hypotension, unspecified: Secondary | ICD-10-CM | POA: Insufficient documentation

## 2019-02-03 DIAGNOSIS — Y908 Blood alcohol level of 240 mg/100 ml or more: Secondary | ICD-10-CM | POA: Insufficient documentation

## 2019-02-03 DIAGNOSIS — X31XXXA Exposure to excessive natural cold, initial encounter: Secondary | ICD-10-CM | POA: Insufficient documentation

## 2019-02-03 DIAGNOSIS — F10239 Alcohol dependence with withdrawal, unspecified: Secondary | ICD-10-CM | POA: Insufficient documentation

## 2019-02-03 DIAGNOSIS — Z0184 Encounter for antibody response examination: Secondary | ICD-10-CM | POA: Insufficient documentation

## 2019-02-03 DIAGNOSIS — F10929 Alcohol use, unspecified with intoxication, unspecified: Secondary | ICD-10-CM

## 2019-02-03 DIAGNOSIS — R040 Epistaxis: Secondary | ICD-10-CM | POA: Insufficient documentation

## 2019-02-03 DIAGNOSIS — T68XXXA Hypothermia, initial encounter: Secondary | ICD-10-CM | POA: Insufficient documentation

## 2019-02-03 DIAGNOSIS — Z20828 Contact with and (suspected) exposure to other viral communicable diseases: Secondary | ICD-10-CM | POA: Insufficient documentation

## 2019-02-03 DIAGNOSIS — F10229 Alcohol dependence with intoxication, unspecified: Principal | ICD-10-CM | POA: Insufficient documentation

## 2019-02-03 DIAGNOSIS — E079 Disorder of thyroid, unspecified: Secondary | ICD-10-CM | POA: Insufficient documentation

## 2019-02-03 LAB — COMPREHENSIVE METABOLIC PANEL
ALT: 13 U/L (ref 0–44)
AST: 25 U/L (ref 15–41)
Albumin: 3.9 g/dL (ref 3.5–5.0)
Alkaline Phosphatase: 63 U/L (ref 38–126)
Anion gap: 12 (ref 5–15)
BUN: 8 mg/dL (ref 6–20)
CO2: 28 mmol/L (ref 22–32)
Calcium: 8.1 mg/dL — ABNORMAL LOW (ref 8.9–10.3)
Chloride: 97 mmol/L — ABNORMAL LOW (ref 98–111)
Creatinine, Ser: 0.85 mg/dL (ref 0.44–1.00)
GFR calc Af Amer: >60 mL/min (ref >60)
GFR calc non Af Amer: >60 mL/min (ref >60)
Glucose, Bld: 89 mg/dL (ref 70–99)
Potassium: 2.9 mmol/L — ABNORMAL LOW (ref 3.5–5.1)
Sodium: 137 mmol/L (ref 135–145)
Total Bilirubin: 0.9 mg/dL (ref 0.3–1.2)
Total Protein: 6.4 g/dL — ABNORMAL LOW (ref 6.5–8.1)

## 2019-02-03 LAB — RAPID URINE DRUG SCREEN, HOSP PERFORMED
Amphetamines: NOT DETECTED
Barbiturates: NOT DETECTED
Benzodiazepines: NOT DETECTED
Cocaine: NOT DETECTED
Opiates: NOT DETECTED
Tetrahydrocannabinol: NOT DETECTED

## 2019-02-03 LAB — I-STAT BETA HCG BLOOD, ED (MC, WL, AP ONLY): I-stat hCG, quantitative: <5 m[IU]/mL (ref <5)

## 2019-02-03 LAB — CBC WITH DIFFERENTIAL/PLATELET
Abs Immature Granulocytes: 0.02 10*3/uL (ref 0.00–0.07)
Basophils Absolute: 0.1 10*3/uL (ref 0.0–0.1)
Basophils Relative: 1 %
Eosinophils Absolute: 0 10*3/uL (ref 0.0–0.5)
Eosinophils Relative: 0 %
HCT: 39.2 % (ref 36.0–46.0)
Hemoglobin: 13.3 g/dL (ref 12.0–15.0)
Immature Granulocytes: 1 %
Lymphocytes Relative: 53 %
Lymphs Abs: 2.3 10*3/uL (ref 0.7–4.0)
MCH: 33.1 pg (ref 26.0–34.0)
MCHC: 33.9 g/dL (ref 30.0–36.0)
MCV: 97.5 fL (ref 80.0–100.0)
Monocytes Absolute: 0.4 10*3/uL (ref 0.1–1.0)
Monocytes Relative: 10 %
Neutro Abs: 1.5 10*3/uL — ABNORMAL LOW (ref 1.7–7.7)
Neutrophils Relative %: 35 %
Platelets: 302 10*3/uL (ref 150–400)
RBC: 4.02 MIL/uL (ref 3.87–5.11)
RDW: 13.2 % (ref 11.5–15.5)
WBC: 4.2 10*3/uL (ref 4.0–10.5)
nRBC: 0 % (ref 0.0–0.2)

## 2019-02-03 LAB — URINALYSIS, ROUTINE W REFLEX MICROSCOPIC
Bilirubin Urine: NEGATIVE
Glucose, UA: NEGATIVE mg/dL
Hgb urine dipstick: NEGATIVE
Ketones, ur: NEGATIVE mg/dL
Leukocytes,Ua: NEGATIVE
Nitrite: NEGATIVE
Protein, ur: NEGATIVE mg/dL
Specific Gravity, Urine: 1.011 (ref 1.005–1.030)
pH: 7 (ref 5.0–8.0)

## 2019-02-03 LAB — ACETAMINOPHEN LEVEL: Acetaminophen (Tylenol), Serum: 16 ug/mL (ref 10–30)

## 2019-02-03 LAB — ETHANOL: Alcohol, Ethyl (B): 542 mg/dL (ref <10)

## 2019-02-03 LAB — SALICYLATE LEVEL: Salicylate Lvl: <7 mg/dL (ref 2.8–30.0)

## 2019-02-03 LAB — CBG MONITORING, ED: Glucose-Capillary: 91 mg/dL (ref 70–99)

## 2019-02-03 MED ORDER — POTASSIUM CHLORIDE 10 MEQ/100ML IV SOLN
10.0000 meq | Freq: Once | INTRAVENOUS | Status: AC
  Administered 2019-02-04: 10 meq via INTRAVENOUS

## 2019-02-03 MED ORDER — MAGNESIUM SULFATE 2 GM/50ML IV SOLN
2.0000 g | Freq: Once | INTRAVENOUS | Status: AC
  Administered 2019-02-03: 2 g via INTRAVENOUS
  Filled 2019-02-03: qty 50

## 2019-02-03 MED ORDER — NALOXONE HCL 4 MG/0.1ML NA LIQD
NASAL | Status: AC
  Filled 2019-02-03: qty 4

## 2019-02-03 MED ORDER — THIAMINE HCL 100 MG/ML IJ SOLN
Freq: Once | INTRAVENOUS | Status: AC
  Administered 2019-02-04: via INTRAVENOUS
  Filled 2019-02-03: qty 1000

## 2019-02-03 MED ORDER — POTASSIUM CHLORIDE 10 MEQ/100ML IV SOLN
10.0000 meq | INTRAVENOUS | Status: AC
  Administered 2019-02-03: 22:00:00 10 meq via INTRAVENOUS
  Filled 2019-02-03 (×2): qty 100

## 2019-02-03 MED ORDER — NALOXONE HCL 0.4 MG/ML IJ SOLN
INTRAMUSCULAR | Status: AC
  Administered 2019-02-03: 0.4 mg
  Filled 2019-02-03: qty 1

## 2019-02-03 MED ORDER — SODIUM CHLORIDE 0.9 % IV BOLUS
1000.0000 mL | Freq: Once | INTRAVENOUS | Status: AC
  Administered 2019-02-03: 1000 mL via INTRAVENOUS

## 2019-02-03 MED ORDER — SODIUM CHLORIDE 0.9 % IV BOLUS
1000.0000 mL | Freq: Once | INTRAVENOUS | Status: AC
  Administered 2019-02-04: 1000 mL via INTRAVENOUS

## 2019-02-03 NOTE — ED Notes (Signed)
Dr. Roslynn Amble aware of hypotension and continues hypothermia-bair hugger in place/Shari Upstill EDPA at bedside/patient re-evaluated and orders noted

## 2019-02-03 NOTE — ED Notes (Signed)
Attempted to call report x2, rn busy in another room. Will call back in 5 min
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
## 2019-02-03 NOTE — ED Provider Notes (Signed)
Patient signed out at end of shift by Dr. Roslynn Amble. Patient to ED unresponsive, ETOH 542, history of same. Patient unable to contribute to history.  History of psych issues - labs pending including assessment for possible overdose/ingestion. No response to Narcan. Plan: Metabolizing - observe  During ED evaluation the patient receiving IVF's. Blood pressure 440 systolic on arrival, dropping to 60's despite fluids. On 3rd liter currently. Potassium supplementation ongoing. Magnesium completed. Temperature noted to be 95 (temp foley). Bair hugger started.   Dr. Leonette Monarch in to see patient. Additional fluids ordered. The patient was moved/agitated with improved blood pressure response, to 10'U systolic. UDS, Tylenol and aspirin levels negative. Had CT negative  Given hypotension, excessive sedation, hypothermia, will admit - likely stepdown.  Hospitalist paged for admission.  Discussed with Dr. Josephine Cables who advises no stepdown beds at [facility]. One bed confirmed available at [facility]. Flow mgr to advise Dr. Josephine Cables who will arrange transfer to [facility].  CRITICAL CARE Performed by: Dewaine Oats   Total critical care time: 45 minutes  Critical care time was exclusive of separately billable procedures and treating other patients.  Critical care was necessary to treat or prevent imminent or life-threatening deterioration.  Critical care was time spent personally by me on the following activities: development of treatment plan with patient and/or surrogate as well as nursing, discussions with consultants, evaluation of patient's response to treatment, examination of patient, obtaining history from patient or surrogate, ordering and performing treatments and interventions, ordering and review of laboratory studies, ordering and review of radiographic studies, pulse oximetry and re-evaluation of patient's condition.    Charlann Lange, PA-C 02/04/19 0346    Fatima Blank, MD 02/04/19 [phone]

## 2019-02-03 NOTE — ED Notes (Signed)
Date and time results received: 02/03/19 2144  Test: ETOH Critical Value: 542  Name of Provider Notified: Dr. Roslynn Amble  Orders Received? Or Actions Taken?: see chart/monitor patient

## 2019-02-03 NOTE — ED Provider Notes (Signed)
[facility] DEPT Provider Note   CSN: [id number] Arrival date & time: 02/03/19  2024     History   Chief Complaint Chief Complaint  Patient presents with  . Unresponsive  . Alcohol Intoxication    HPI Sheila Graves is a 38 y.o. female.  Presents emergency department after being found unresponsive.  Patient is found in street.  Report patient may have drank a bottle of vodka and has history of alcohol abuse.  History limited due to patient's altered mental status.     HPI  Past Medical History:  Diagnosis Date  . Alcohol withdrawal seizure ([place])   . ETOH abuse   . Hypokalemia   . Pancreatitis   . Thyroid disease     Patient Active Problem List   Diagnosis Date Noted  . Fracture of clavicle, right, open 11/11/2018  . Alcohol dependence with withdrawal ([place]) 01/31/2018  . Major depressive disorder, recurrent, severe without psychotic features ([place]) 01/31/2018    Past Surgical History:  Procedure Laterality Date  . FINGER SURGERY    . IRRIGATION AND DEBRIDEMENT SHOULDER Right 11/12/2018   Procedure: Irrigation And Debridement Shoulder;  Surgeon: Shona Needles, MD;  Location: [place];  Service: Orthopedics;  Laterality: Right;  . ORIF CLAVICULAR FRACTURE Right 11/12/2018   Procedure: OPEN REDUCTION INTERNAL FIXATION (ORIF) CLAVICULAR FRACTURE;  Surgeon: Shona Needles, MD;  Location: [place];  Service: Orthopedics;  Laterality: Right;     OB History   No obstetric history on file.      Home Medications    Prior to Admission medications   Medication Sig Start Date End Date Taking? Authorizing Provider  acetaminophen (TYLENOL) 500 MG tablet Take 1 tablet (500 mg total) by mouth every 12 (twelve) hours. Patient not taking: Reported on 01/18/2019 11/12/18   Delray Alt, PA-C  chlordiazePOXIDE (LIBRIUM) 25 MG capsule 50mg  PO TID x 1D, then 25-50mg  PO BID X 1D, then 25-50mg  PO QD X 1D Patient taking differently: Take 25-50 mg by mouth See  admin instructions. 50mg  PO TID x 1D, then 25-50mg  PO BID X 1D, then 25-50mg  PO QD X 1D 01/18/19   Henderly, Britni A, PA-C    Family History History reviewed. No pertinent family history.  Social History Social History   Tobacco Use  . Smoking status: Current Every Day Smoker    Packs/day: 1.00  . Smokeless tobacco: Never Used  Substance Use Topics  . Alcohol use: Yes    Comment: fifth of liquor  . Drug use: Not Currently    Types: Heroin     Allergies   Patient has no known allergies.   Review of Systems Review of Systems   Physical Exam Updated Vital Signs BP 109/76   Pulse 73   Temp (!) 94.6 F (34.8 C)   Resp (!) 21   SpO2 100%   Physical Exam Constitutional:      Comments: Unresponsive, lying in bed  HENT:     Head: Normocephalic and atraumatic.     Nose: Nose normal.     Mouth/Throat:     Mouth: Mucous membranes are moist.     Pharynx: Oropharynx is clear.  Eyes:     Comments: Pupils 8mm equal round and reactive  Neck:     Musculoskeletal: Normal range of motion and neck supple.  Cardiovascular:     Rate and Rhythm: Normal rate and regular rhythm.     Pulses: Normal pulses.     Heart sounds: Normal  heart sounds.  Pulmonary:     Effort: Pulmonary effort is normal.     Breath sounds: Normal breath sounds.  Abdominal:     General: Abdomen is flat. Bowel sounds are normal.     Palpations: Abdomen is soft.  Musculoskeletal:        General: No swelling, tenderness or signs of injury.  Skin:    General: Skin is warm and dry.     Capillary Refill: Capillary refill takes less than 2 seconds.  Neurological:     Comments: Eyes no response to pain, verbal no response, moving all four extremities spontaneously      ED Treatments / Results  Labs (all labs ordered are listed, but only abnormal results are displayed) Labs Reviewed  ETHANOL - Abnormal; Notable for the following components:      Result Value   Alcohol, Ethyl (B) 542 (*)    All other  components within normal limits  CBC WITH DIFFERENTIAL/PLATELET - Abnormal; Notable for the following components:   Neutro Abs 1.5 (*)    All other components within normal limits  COMPREHENSIVE METABOLIC PANEL - Abnormal; Notable for the following components:   Potassium 2.9 (*)    Chloride 97 (*)    Calcium 8.1 (*)    Total Protein 6.4 (*)    All other components within normal limits  ACETAMINOPHEN LEVEL  SALICYLATE LEVEL  URINALYSIS, ROUTINE W REFLEX MICROSCOPIC  RAPID URINE DRUG SCREEN, HOSP PERFORMED  CBG MONITORING, ED  I-STAT BETA HCG BLOOD, ED ([facility], [facility], [facility] ONLY)    EKG None  Radiology Dg Chest Portable 1 View  Result Date: 02/03/2019 CLINICAL DATA:  Shortness of breath.  Patient found unresponsive. EXAM: PORTABLE CHEST 1 VIEW COMPARISON:  January 23, 2019 FINDINGS: The heart size and mediastinal contours are within normal limits. Both lungs are clear. The visualized skeletal structures are unremarkable. IMPRESSION: No active disease. Electronically Signed   By: Gerome Samavid  Williams III M.D   On: 02/03/2019 21:00    Procedures .Critical Care Performed by: Milagros Lollykstra, Claryssa Sandner S, MD Authorized by: Milagros Lollykstra, Junelle Hashemi S, MD   Critical care provider statement:    Critical care time (minutes):  42   Critical care was time spent personally by me on the following activities:  Evaluation of patient's response to treatment, ordering and performing treatments and interventions, ordering and review of laboratory studies, ordering and review of radiographic studies, pulse oximetry, re-evaluation of patient's condition, obtaining history from patient or surrogate and review of old charts   (including critical care time)  Medications Ordered in ED Medications  magnesium sulfate IVPB 2 g 50 mL (has no administration in time range)  potassium chloride 10 mEq in 100 mL IVPB (has no administration in time range)  sodium chloride 0.9 % bolus 1,000 mL (has no administration in time range)  naloxone  (NARCAN) 0.4 MG/ML injection (0.4 mg  Given 02/03/19 2053)     Initial Impression / Assessment and Plan / ED Course  I have reviewed the triage vital signs and the nursing notes.  Pertinent labs & imaging results that were available during my care of the patient were reviewed by me and considered in my medical decision making (see chart for details).  Clinical Course as of Feb 04 3  Fri Feb 03, 2019  2044 Completed initial assessment   [RD]  2051 Rechecked, not opening eyes or making comprehensible sounds but moving spontaneously   [RD]  2145 Alcohol, Ethyl (B)(!!): 542 [RD]  2236 Rechecked  patient   [RD]    Clinical Course User Index [RD] Milagros Lollykstra, Aleyah Balik S, MD       38 year old lady who presented to the emergency department after being found unresponsive on the ground.  Time my initial assessment patient was not responsive appeared to be quite intoxicated but was breathing well on her own.  Initial blood pressure and heart rate were stable.  Labs concerning for significant alcohol intoxication, some hypokalemia but otherwise unremarkable.  Provided fluids, electrolyte replacement.  [facility] Eudelia Bunchardama.  Plan to observe patient in the ER while patient metabolizes.  Final plan and disposition pending.  Of observation.  Please refer to their note for final plan and disposition.    Final Clinical Impressions(s) / ED Diagnoses   Final diagnoses:  Alcoholic intoxication with complication (HCC)  Unresponsive  Hypokalemia    ED Discharge Orders    None       Milagros Lollykstra, Eloisa Chokshi S, MD 02/04/19 [phone]

## 2019-02-04 ENCOUNTER — Emergency Department (HOSPITAL_COMMUNITY): Payer: Self-pay

## 2019-02-04 DIAGNOSIS — E876 Hypokalemia: Secondary | ICD-10-CM

## 2019-02-04 DIAGNOSIS — F10230 Alcohol dependence with withdrawal, uncomplicated: Secondary | ICD-10-CM

## 2019-02-04 DIAGNOSIS — I959 Hypotension, unspecified: Secondary | ICD-10-CM

## 2019-02-04 DIAGNOSIS — F10229 Alcohol dependence with intoxication, unspecified: Secondary | ICD-10-CM | POA: Diagnosis present

## 2019-02-04 DIAGNOSIS — T68XXXA Hypothermia, initial encounter: Secondary | ICD-10-CM

## 2019-02-04 DIAGNOSIS — F10929 Alcohol use, unspecified with intoxication, unspecified: Secondary | ICD-10-CM

## 2019-02-04 LAB — SARS CORONAVIRUS 2 BY RT PCR (HOSPITAL ORDER, PERFORMED IN ~~LOC~~ HOSPITAL LAB): SARS Coronavirus 2: NEGATIVE

## 2019-02-04 LAB — CBC
HCT: 31.6 % — ABNORMAL LOW (ref 36.0–46.0)
Hemoglobin: 10.4 g/dL — ABNORMAL LOW (ref 12.0–15.0)
MCH: 33.5 pg (ref 26.0–34.0)
MCHC: 32.9 g/dL (ref 30.0–36.0)
MCV: 101.9 fL — ABNORMAL HIGH (ref 80.0–100.0)
Platelets: 281 10*3/uL (ref 150–400)
RBC: 3.1 MIL/uL — ABNORMAL LOW (ref 3.87–5.11)
RDW: 13.6 % (ref 11.5–15.5)
WBC: 3.8 10*3/uL — ABNORMAL LOW (ref 4.0–10.5)
nRBC: 0 % (ref 0.0–0.2)

## 2019-02-04 LAB — COMPREHENSIVE METABOLIC PANEL
ALT: 10 U/L (ref 0–44)
AST: 21 U/L (ref 15–41)
Albumin: 2.8 g/dL — ABNORMAL LOW (ref 3.5–5.0)
Alkaline Phosphatase: 47 U/L (ref 38–126)
Anion gap: 6 (ref 5–15)
BUN: 5 mg/dL — ABNORMAL LOW (ref 6–20)
CO2: 21 mmol/L — ABNORMAL LOW (ref 22–32)
Calcium: 6.1 mg/dL — CL (ref 8.9–10.3)
Chloride: 116 mmol/L — ABNORMAL HIGH (ref 98–111)
Creatinine, Ser: 0.57 mg/dL (ref 0.44–1.00)
GFR calc Af Amer: >60 mL/min (ref >60)
GFR calc non Af Amer: >60 mL/min (ref >60)
Glucose, Bld: 80 mg/dL (ref 70–99)
Potassium: 4.2 mmol/L (ref 3.5–5.1)
Sodium: 143 mmol/L (ref 135–145)
Total Bilirubin: 0.4 mg/dL (ref 0.3–1.2)
Total Protein: 4.8 g/dL — ABNORMAL LOW (ref 6.5–8.1)

## 2019-02-04 LAB — HIV ANTIBODY (ROUTINE TESTING W REFLEX): HIV Screen 4th Generation wRfx: NONREACTIVE

## 2019-02-04 MED ORDER — FOLIC ACID 1 MG PO TABS
1.0000 mg | ORAL_TABLET | Freq: Every day | ORAL | Status: DC
  Administered 2019-02-04: 1 mg via ORAL
  Filled 2019-02-04: qty 1

## 2019-02-04 MED ORDER — ENOXAPARIN SODIUM 40 MG/0.4ML ~~LOC~~ SOLN: 40.0000 mg | SUBCUTANEOUS | Status: DC

## 2019-02-04 MED ORDER — LORAZEPAM 2 MG/ML IJ SOLN: 2.0000 mg | INTRAMUSCULAR | Status: DC | PRN

## 2019-02-04 MED ORDER — SODIUM CHLORIDE 0.9 % IV SOLN
Freq: Once | INTRAVENOUS | Status: AC
  Administered 2019-02-04: 04:00:00 via INTRAVENOUS

## 2019-02-04 MED ORDER — ADULT MULTIVITAMIN W/MINERALS CH
1.0000 | ORAL_TABLET | Freq: Every day | ORAL | Status: DC
  Administered 2019-02-04: 1 via ORAL
  Filled 2019-02-04: qty 1

## 2019-02-04 MED ORDER — VITAMIN B-1 100 MG PO TABS
100.0000 mg | ORAL_TABLET | Freq: Every day | ORAL | Status: DC
  Administered 2019-02-04: 100 mg via ORAL
  Filled 2019-02-04: qty 1

## 2019-02-04 NOTE — Progress Notes (Addendum)
Pt transferred via EMS. BP running lower side 83/62. CIWA is 2 this time. Pt does not look comfortable. Wants to check out, doing back and forth phone conversation with her girlfriend. Wants to check out herself AMA, paged hospitalist.  RN told her "why its important for her to be in hospital for her treatment". Offered her food, took foley out, tried to make her comfortable but Pt is insisting to get out of hospital. She wants to go right away.   Palma Holter, RN

## 2019-02-04 NOTE — ED Notes (Signed)
Patients shoes placed in belonging bag. Pts debit card in in one of her shoes. Pt is aware.

## 2019-02-04 NOTE — ED Notes (Signed)
Date and time results received: 02/04/19  0735  (use smartphrase ".now" to insert current time)  Test:Ca++ Critical Value: 6.1 Name of Provider Notified: Tim, RN  Orders Received? Or Actions Taken?: Actions Taken: Engineer, structural

## 2019-02-04 NOTE — ED Notes (Signed)
Patient transported to CT 

## 2019-02-04 NOTE — H&P (Signed)
History and Physical  Sheila Graves SWN:[id number] DOB: 1981/05/16 DOA: 02/03/2019  Referring physician: Madalyn Rob MD PCP: Patient, No Pcp Per  Patient coming from: Home   Chief Complaint: Alcohol Intoxication and unresponsive  HPI: Sheila Graves is a 38 y.o. female with medical history significant for alcohol abuse who presents to the emergency department via EMS after being found in the street unresponsive, it was thought that patient drank a whole bottle of vodka per ED medical record.  Nasal trumpet was placed by EMS due to nosebleed which has since stoppped.  History was unobtainable from the patient by the ED team due to patient's unresponsiveness.  ED Course:  In the emergency department, patient was hypothermic at 95.86F, she was hypotensive with a BP of 59/39.  IV hydration was provided, [facility] was given with improvement in temperature to 60F and BP to 80/59.  Work-up in the ED showed normal CBC, hypokalemia, alcohol level of 542. Patient was started on IV banana bag.  Magnesium sulfate was given, potassium was replenished.  IV Narcan 0.4 mg x 1 was given. SARS rhinovirus was negative.  CT of head without contrast and chest x-ray showed no acute findings.  At bedside, patient was somnolent but was easily arousable, she was oriented to person and place but not to time.  Review of Systems: Constitutional: Negative for chills and fever.  HENT: Negative for ear pain and sore throat.   Eyes: Negative for pain and visual disturbance.  Respiratory: Negative for cough, chest tightness and shortness of breath.   Cardiovascular: Negative for chest pain and palpitations.  Gastrointestinal: Negative for abdominal pain and vomiting.  Endocrine: Negative for polyphagia and polyuria.  Genitourinary: Negative for decreased urine volume, dysuria.  Musculoskeletal: Negative for arthralgias and back pain.  Skin: Negative for color change and rash.  Allergic/Immunologic: Negative for  immunocompromised state.  Neurological: Somnolent but easily arousable.  Negative for tremors Hematological: Does not bruise/bleed easily.  All other systems reviewed and are negative  Past Medical History:  Diagnosis Date  . Alcohol withdrawal seizure ([place])   . ETOH abuse   . Hypokalemia   . Pancreatitis   . Thyroid disease    Past Surgical History:  Procedure Laterality Date  . FINGER SURGERY    . IRRIGATION AND DEBRIDEMENT SHOULDER Right 11/12/2018   Procedure: Irrigation And Debridement Shoulder;  Surgeon: Sheila Needles, MD;  Location: [place];  Service: Orthopedics;  Laterality: Right;  . ORIF CLAVICULAR FRACTURE Right 11/12/2018   Procedure: OPEN REDUCTION INTERNAL FIXATION (ORIF) CLAVICULAR FRACTURE;  Surgeon: Sheila Needles, MD;  Location: [place];  Service: Orthopedics;  Laterality: Right;    Social History:  reports that she has been smoking. She has been smoking about 1.00 pack per day. She has never used smokeless tobacco. She reports current alcohol use. She reports previous drug use. Drug: Heroin.   No Known Allergies  History reviewed. No pertinent family history.   Prior to Admission medications   Medication Sig Start Date End Date Taking? Authorizing Provider  acetaminophen (TYLENOL) 500 MG tablet Take 1 tablet (500 mg total) by mouth every 12 (twelve) hours. Patient not taking: Reported on 01/18/2019 11/12/18   Delray Alt, PA-C  chlordiazePOXIDE (LIBRIUM) 25 MG capsule 50mg  PO TID x 1D, then 25-50mg  PO BID X 1D, then 25-50mg  PO QD X 1D Patient not taking: Reported on 02/04/2019 01/18/19   Henderly, Britni A, PA-C    Physical Exam: BP (!) 77/52   Pulse 91  Temp 97.7 F (36.5 C)   Resp 15   SpO2 96%   . General: 38 y.o. year-old female well developed well nourished in no acute distress.  Alert and oriented x3. . Cardiovascular: Regular rate and rhythm with no rubs or gallops.  No thyromegaly or JVD noted.  No lower extremity edema. 2/4 pulses in all 4  extremities. Marland Kitchen. Respiratory: Clear to auscultation with no wheezes or rales. Good inspiratory effort. . Abdomen: Soft nontender nondistended with normal bowel sounds x4 quadrants. . Muskuloskeletal: No cyanosis, clubbing or edema noted bilaterally . Neuro: CN II-XII intact, strength, sensation, reflexes . Skin: No ulcerative lesions noted or rashes . Psychiatry: Judgement and insight appear normal. Mood is appropriate for condition and setting          Labs on Admission:  Basic Metabolic Panel: Recent Labs  Lab 02/03/19 2044  NA 137  K 2.9*  CL 97*  CO2 28  GLUCOSE 89  BUN 8  CREATININE 0.85  CALCIUM 8.1*   Liver Function Tests: Recent Labs  Lab 02/03/19 2044  AST 25  ALT 13  ALKPHOS 63  BILITOT 0.9  PROT 6.4*  ALBUMIN 3.9   No results for input(s): LIPASE, AMYLASE in the last 168 hours. No results for input(s): AMMONIA in the last 168 hours. CBC: Recent Labs  Lab 02/03/19 2044  WBC 4.2  NEUTROABS 1.5*  HGB 13.3  HCT 39.2  MCV 97.5  PLT 302   Cardiac Enzymes: No results for input(s): CKTOTAL, CKMB, CKMBINDEX, TROPONINI in the last 168 hours.  BNP (last 3 results) No results for input(s): BNP in the last 8760 hours.  ProBNP (last 3 results) No results for input(s): PROBNP in the last 8760 hours.  CBG: Recent Labs  Lab 02/03/19 2034  GLUCAP 91    Radiological Exams on Admission: Ct Head Wo Contrast  Result Date: 02/04/2019 CLINICAL DATA:  38 year old female found unresponsive. EXAM: CT HEAD WITHOUT CONTRAST TECHNIQUE: Contiguous axial images were obtained from the base of the skull through the vertex without intravenous contrast. COMPARISON:  Head CT 01/23/2019 FINDINGS: Brain: No evidence of acute infarction, hemorrhage, hydrocephalus, extra-axial collection or mass lesion/mass effect. Vascular: No hyperdense vessel or unexpected calcification. Skull: Normal. Negative for fracture or focal lesion. Sinuses/Orbits: Mild diffuse mucoperiosteal thickening  paranasal sinuses. No air-fluid level. The mastoid air cells are clear. Other: None IMPRESSION: Normal unenhanced CT of the brain. Electronically Signed   By: Elgie CollardArash  Radparvar M.D.   On: 02/04/2019 01:46   Dg Chest Portable 1 View  Result Date: 02/03/2019 CLINICAL DATA:  Shortness of breath.  Patient found unresponsive. EXAM: PORTABLE CHEST 1 VIEW COMPARISON:  January 23, 2019 FINDINGS: The heart size and mediastinal contours are within normal limits. Both lungs are clear. The visualized skeletal structures are unremarkable. IMPRESSION: No active disease. Electronically Signed   By: Gerome Samavid  Williams III M.D   On: 02/03/2019 21:00    EKG: I independently viewed the EKG done and my findings are as followed: Sinus rhythm at 74bpm Assessment/Plan Present on Admission: . Alcohol intoxication (HCC) . Alcohol dependence with withdrawal (HCC)  Principal Problem:   Alcohol intoxication (HCC) Active Problems:   Alcohol dependence with withdrawal (HCC)   Hypokalemia   Hypothermia   Hypotension  Alcohol intoxication/alcohol dependence with withdrawal  [facility] Alcohol level was 542 Patient was started on IV banana bag, we shall continue with same at this time  Continue to monitor for withdrawal symptoms and continue alcohol withdrawal per CIWA protocol Continue thiamine, folic acid and multivitamin  Hypokalemia K+ was 2.9; this was replenished Continue to monitor potassium level with morning labs  Hypothermia Temperature on arrival at the ED was 53F, Bair Hugger was given with improvement in temperature to 76F, continue [employer] Hug[employer] and continue to monitor body temperature for improvement.  Hypotension BP has improved to 80/59, Continue IV hydration and continue to monitor BP.  DVT prophylaxis: Lovenox, SCDs   Code Status: Full code  Family Communication: None at bedside  Disposition Plan: Patient will be discharged home  once clinically improved  Consults called: None  Admission status: Full code    Frankey Shownladapo Lenda Baratta MD [facility]  If 7PM-7AM, please contact night-coverage [email or web address] 02/04/2019, 3:29 AM

## 2019-02-04 NOTE — Progress Notes (Signed)
MD talked to the pt over phone. RN took off her Foley she came from [place] long. IV off, tele DC. Pt walked in a hallway fine without dizziness, latest BP 90/60. RN gave her hospital scrub to change and wheeled her down to the [employer]	[place] entrance with Melburn Popper that she called. She signed her AMA before she left.  Palma Holter, RN

## 2019-02-04 NOTE — ED Notes (Signed)
She is easily aroused and is oriented x 4 with clear speech. She re-fixes her hair with a small hair tie at this time. [facility] also has just arrived, and are prepping her for transport as I write this.

## 2019-02-04 NOTE — ED Notes (Signed)
[facility] called for transport, papers in divider.

## 2019-02-04 NOTE — ED Notes (Signed)
Writer spoke with hospitalist and explained pt has had 2 runs of potassium thus far and asked if he wanted pt to get a 3rd. Hospitalist stated no, 2 was enough for now. Held 3rd run of potassium per verbal order.

## 2019-02-04 NOTE — ED Notes (Signed)
ED TO INPATIENT HANDOFF REPORT  ED Nurse Name and Phone #: [place] G, [phone]  S Name/Age/Gender Sheila Graves 38 y.o. female Room/Bed: RESB/RESB  Code Status   Code Status: Full Code  Home/SNF/Other Home Patient oriented to: self Is this baseline? No   Triage Complete: Triage complete  Chief Complaint Alcohol Intoxication; Syncope  Triage Note Patient found in the street unresponsive, pt thought to have drank a whole bottle of vodka and has hx of alcohol abuse. Nasal trumpet placed by EMS. Minimal gag reflex, responsive only to pain. 18g IV placed in LFA and towel collar placed around neck by EMS.    Allergies No Known Allergies  Level of Care/Admitting Diagnosis ED Disposition    ED Disposition Condition Comment   Admit  Hospital Area: [facility] [[id number]]  Level of Care: Progressive [102]  I expect the patient will be discharged within 24 hours: No (not a candidate for 5C-Observation unit)  Covid Evaluation: Confirmed COVID Negative  Diagnosis: Alcohol intoxication [facility]) [[id number]]  Admitting Physician: Frankey Shown [[id number]]  Attending Physician: Frankey Shown [[id number]]  PT Class (Do Not Modify): Observation [104]  PT Acc Code (Do Not Modify): Observation [10022]       B Medical/Surgery History Past Medical History:  Diagnosis Date  . Alcohol withdrawal seizure (HCC)   . ETOH abuse   . Hypokalemia   . Pancreatitis   . Thyroid disease    Past Surgical History:  Procedure Laterality Date  . FINGER SURGERY    . IRRIGATION AND DEBRIDEMENT SHOULDER Right 11/12/2018   Procedure: Irrigation And Debridement Shoulder;  Surgeon: Roby Lofts, MD;  [facility];  Service: Orthopedics;  Laterality: Right;  . ORIF CLAVICULAR FRACTURE Right 11/12/2018   Procedure: OPEN REDUCTION INTERNAL FIXATION (ORIF) CLAVICULAR FRACTURE;  Surgeon: Roby Lofts, MD;  Location[facility] OR;  Service: Orthopedics;  Laterality: Right;     A IV  Location/Drains/Wounds Patient Lines/Drains/Airways Status   Active Line/Drains/Airways    Name:   Placement date:   Placement time:   Site:   Days:   Peripheral IV 02/03/19 Right Forearm   02/03/19    2212    Forearm   1   Peripheral IV 02/03/19 Left Forearm   02/03/19    2154    Forearm   1   Peripheral IV 02/04/19 Left Antecubital   02/04/19    0031    Antecubital   less than 1          Intake/Output Last 24 hours  Intake/Output Summary (Last 24 hours) at 02/04/2019 0424 Last data filed at 02/04/2019 0346 Gross per 24 hour  Intake 4425.7 ml  Output 2400 ml  Net 2025.7 ml    Labs/Imaging Results for orders placed or performed during the hospital encounter of 02/03/19 (from the past 48 hour(s))  CBG monitoring, ED     Status: None   Collection Time: 02/03/19  8:34 PM  Result Value Ref Range   Glucose-Capillary 91 70 - 99 mg/dL  Ethanol     Status: Abnormal   Collection Time: 02/03/19  8:44 PM  Result Value Ref Range   Alcohol, Ethyl (B) 542 (HH) <10 mg/dL    Comment: CRITICAL RESULT CALLED TO, READ BACK BY AND VERIFIED WITH: C,DOSTER AT 2143 ON 02/03/19 BY A,MOHAMED (NOTE) Lowest detectable limit for serum alcohol is 10 mg/dL. For medical purposes only. Performed at [facility], [address] [place], [place] [address]   Acetaminophen level  Status: None   Collection Time: 02/03/19  8:44 PM  Result Value Ref Range   Acetaminophen (Tylenol), Serum 16 10 - 30 ug/mL    Comment: (NOTE) Therapeutic concentrations vary significantly. A range of 10-30 ug/mL  may be an effective concentration for many patients. However, some  are best treated at concentrations outside of this range. Acetaminophen concentrations >150 ug/mL at 4 hours after ingestion  and >50 ug/mL at 12 hours after ingestion are often associated with  toxic reactions. Performed at [facility], [place] [address], [place], [place] [address]   Salicylate level     Status:  None   Collection Time: 02/03/19  8:44 PM  Result Value Ref Range   Salicylate Lvl <0.2 2.8 - 30.0 mg/dL    Comment: Performed at [facility], [place] [address], [place], [place] [address]  CBC with Differential     Status: Abnormal   Collection Time: 02/03/19  8:44 PM  Result Value Ref Range   WBC 4.2 4.0 - 10.5 K/uL   RBC 4.02 3.87 - 5.11 MIL/uL   Hemoglobin 13.3 12.0 - 15.0 g/dL   HCT 39.2 36.0 - 46.0 %   MCV 97.5 80.0 - 100.0 fL   MCH 33.1 26.0 - 34.0 pg   MCHC 33.9 30.0 - 36.0 g/dL   RDW 13.2 11.5 - 15.5 %   Platelets 302 150 - 400 K/uL   nRBC 0.0 0.0 - 0.2 %   Neutrophils Relative % 35 %   Neutro Abs 1.5 (L) 1.7 - 7.7 K/uL   Lymphocytes Relative 53 %   Lymphs Abs 2.3 0.7 - 4.0 K/uL   Monocytes Relative 10 %   Monocytes Absolute 0.4 0.1 - 1.0 K/uL   Eosinophils Relative 0 %   Eosinophils Absolute 0.0 0.0 - 0.5 K/uL   Basophils Relative 1 %   Basophils Absolute 0.1 0.0 - 0.1 K/uL   Immature Granulocytes 1 %   Abs Immature Granulocytes 0.02 0.00 - 0.07 K/uL    Comment: Performed at [facility], [place] [address] [place], [place] [address]  Comprehensive metabolic panel     Status: Abnormal   Collection Time: 02/03/19  8:44 PM  Result Value Ref Range   Sodium 137 135 - 145 mmol/L   Potassium 2.9 (L) 3.5 - 5.1 mmol/L   Chloride 97 (L) 98 - 111 mmol/L   CO2 28 22 - 32 mmol/L   Glucose, Bld 89 70 - 99 mg/dL   BUN 8 6 - 20 mg/dL   Creatinine, Ser 0.85 0.44 - 1.00 mg/dL   Calcium 8.1 (L) 8.9 - 10.3 mg/dL   Total Protein 6.4 (L) 6.5 - 8.1 g/dL   Albumin 3.9 3.5 - 5.0 g/dL   AST 25 15 - 41 U/L   ALT 13 0 - 44 U/L   Alkaline Phosphatase 63 38 - 126 U/L   Total Bilirubin 0.9 0.3 - 1.2 mg/dL   GFR calc non Af Amer >60 >60 mL/min   GFR calc Af Amer >60 >60 mL/min   Anion gap 12 5 - 15    Comment: Performed at [facility], [place] [address] [place], [place] [address]  Urinalysis, Routine w reflex microscopic     Status:  None   Collection Time: 02/03/19  8:44 PM  Result Value Ref Range   Color, Urine YELLOW YELLOW   APPearance CLEAR CLEAR   Specific Gravity, Urine 1.011 1.005 - 1.030   pH 7.0 5.0 -  8.0   Glucose, UA NEGATIVE NEGATIVE mg/dL   Hgb urine dipstick NEGATIVE NEGATIVE   Bilirubin Urine NEGATIVE NEGATIVE   Ketones, ur NEGATIVE NEGATIVE mg/dL   Protein, ur NEGATIVE NEGATIVE mg/dL   Nitrite NEGATIVE NEGATIVE   Leukocytes,Ua NEGATIVE NEGATIVE    Comment: Performed at [facility], [address] [place], [place] [address]  Rapid urine drug screen (hospital performed)     Status: None   Collection Time: 02/03/19  8:45 PM  Result Value Ref Range   Opiates NONE DETECTED NONE DETECTED   Cocaine NONE DETECTED NONE DETECTED   Benzodiazepines NONE DETECTED NONE DETECTED   Amphetamines NONE DETECTED NONE DETECTED   Tetrahydrocannabinol NONE DETECTED NONE DETECTED   Barbiturates NONE DETECTED NONE DETECTED    Comment: (NOTE) DRUG SCREEN FOR MEDICAL PURPOSES ONLY.  IF CONFIRMATION IS NEEDED FOR ANY PURPOSE, NOTIFY LAB WITHIN 5 DAYS. LOWEST DETECTABLE LIMITS FOR URINE DRUG SCREEN Drug Class                     Cutoff (ng/mL) Amphetamine and metabolites    1000 Barbiturate and metabolites    200 Benzodiazepine                 200 Tricyclics and metabolites     300 Opiates and metabolites        300 Cocaine and metabolites        300 THC                            50 Performed at [facility], [address] [place], [place] [address]   I-Stat Beta hCG blood, ED ([facility], [facility], [facility] only)     Status: None   Collection Time: 02/03/19  9:09 PM  Result Value Ref Range   I-stat hCG, quantitative <5.0 <5 mIU/mL   Comment 3            Comment:   GEST. AGE      CONC.  (mIU/mL)   <=1 WEEK        5 - 50     2 WEEKS       50 - 500     3 WEEKS       100 - 10,000     4 WEEKS     1,000 - 30,000        FEMALE AND NON-PREGNANT FEMALE:     LESS THAN 5 mIU/mL   SARS  Coronavirus 2 [facility](Hospital order, Performed in [facility] hospital lab)     Status: None   Collection Time: 02/04/19 12:30 AM  Result Value Ref Range   SARS Coronavirus 2 NEGATIVE NEGATIVE    Comment: (NOTE) If result is NEGATIVE SARS-CoV-2 target nucleic acids are NOT DETECTED. The SARS-CoV-2 RNA is generally detectable in upper and lower  respiratory specimens during the acute phase of infection. The lowest  concentration of SARS-CoV-2 viral copies this assay can detect is 250  copies / mL. A negative result does not preclude SARS-CoV-2 infection  and should not be used as the sole basis for treatment or other  patient management decisions.  A negative result may occur with  improper specimen collection / handling, submission of specimen other  than nasopharyngeal swab, presence of viral mutation(s) within the  areas targeted by this assay, and inadequate number of viral copies  (<250 copies / mL). A negative result must be combined with  clinical  observations, patient history, and epidemiological information. If result is POSITIVE SARS-CoV-2 target nucleic acids are DETECTED. The SARS-CoV-2 RNA is generally detectable in upper and lower  respiratory specimens dur ing the acute phase of infection.  Positive  results are indicative of active infection with SARS-CoV-2.  Clinical  correlation with patient history and other diagnostic information is  necessary to determine patient infection status.  Positive results do  not rule out bacterial infection or co-infection with other viruses. If result is PRESUMPTIVE POSTIVE SARS-CoV-2 nucleic acids MAY BE PRESENT.   A presumptive positive result was obtained on the submitted specimen  and confirmed on repeat testing.  While 2019 novel coronavirus  (SARS-CoV-2) nucleic acids may be present in the submitted sample  additional confirmatory testing may be necessary for epidemiological  and / or clinical management purposes  to differentiate  between  SARS-CoV-2 and other Sarbecovirus currently known to infect humans.  If clinically indicated additional testing with an alternate test  methodology [phone] is advised. The SARS-CoV-2 RNA is generally  detectable in upper and lower respiratory sp ecimens during the acute  phase of infection. The expected result is Negative. Fact Sheet for Patients:  BoilerBrush.com.cyhttps://www.fda.gov/media/136312/download Fact Sheet for Healthcare Providers: https://pope.com/https://www.fda.gov/media/136313/download This test is not yet approved or cleared by the [place] FDA and has been authorized for detection and/or diagnosis of SARS-CoV-2 by FDA under an Emergency Use Authorization (EUA).  This EUA will remain in effect (meaning this test can be used) for the duration of the COVID-19 declaration under Section 564(b)(1) of the Act, 21 U.S.C. section 360bbb-3(b)(1), unless the authorization is terminated or revoked sooner. Performed at [facility], [address] [place], [place] [address]    Ct Head Wo Contrast  Result Date: 02/04/2019 CLINICAL DATA:  38 year old female found unresponsive. EXAM: CT HEAD WITHOUT CONTRAST TECHNIQUE: Contiguous axial images were obtained from the base of the skull through the vertex without intravenous contrast. COMPARISON:  Head CT 01/23/2019 FINDINGS: Brain: No evidence of acute infarction, hemorrhage, hydrocephalus, extra-axial collection or mass lesion/mass effect. Vascular: No hyperdense vessel or unexpected calcification. Skull: Normal. Negative for fracture or focal lesion. Sinuses/Orbits: Mild diffuse mucoperiosteal thickening paranasal sinuses. No air-fluid level. The mastoid air cells are clear. Other: None IMPRESSION: Normal unenhanced CT of the brain. Electronically Signed   By: Elgie CollardArash  Radparvar M.D.   On: 02/04/2019 01:46   Dg Chest Portable 1 View  Result Date: 02/03/2019 CLINICAL DATA:  Shortness of breath.  Patient found unresponsive. EXAM: PORTABLE  CHEST 1 VIEW COMPARISON:  January 23, 2019 FINDINGS: The heart size and mediastinal contours are within normal limits. Both lungs are clear. The visualized skeletal structures are unremarkable. IMPRESSION: No active disease. Electronically Signed   By: Gerome Samavid  Williams III M.D   On: 02/03/2019 21:00    Pending Labs Unresulted Labs (From admission, onward)    Start     Ordered   02/04/19 0500  Comprehensive metabolic panel  Tomorrow morning,   R     02/04/19 0253   02/04/19 0500  CBC  Tomorrow morning,   R     02/04/19 0253   02/04/19 0250  HIV antibody (Routine Testing)  Once,   STAT     02/04/19 0253          Vitals/Pain Today's Vitals   02/04/19 0315 02/04/19 0345 02/04/19 0400 02/04/19 0415  BP: (!) 77/52 (!) 69/40 (!) 76/48 (!) 79/54  Pulse: 91 92 89 88  Resp: 15 18  20  Temp: 97.7 F (36.5 C) 97.9 F (36.6 C) 97.9 F (36.6 C) 97.9 F (36.6 C)  TempSrc:      SpO2: 96% 95% 92% 96%  PainSc:        Isolation Precautions No active isolations  Medications Medications  potassium chloride 10 mEq in 100 mL IVPB (0 mEq Intravenous Stopped 02/03/19 2327)  LORazepam (ATIVAN) injection 2-3 mg (has no administration in time range)  folic acid (FOLVITE) tablet 1 mg (has no administration in time range)  multivitamin with minerals tablet 1 tablet (has no administration in time range)  thiamine (VITAMIN B-1) tablet 100 mg (has no administration in time range)  enoxaparin (LOVENOX) injection 40 mg (has no administration in time range)  naloxone (NARCAN) 0.4 MG/ML injection (0.4 mg  Given 02/03/19 2053)  magnesium sulfate IVPB 2 g 50 mL (0 g Intravenous Stopped 02/03/19 2322)  sodium chloride 0.9 % bolus 1,000 mL (0 mLs Intravenous Stopped 02/04/19 0234)  sodium chloride 0.9 % bolus 1,000 mL (0 mLs Intravenous Stopped 02/04/19 0346)  sodium chloride 0.9 % 1,000 mL with thiamine 100 mg, folic acid 1 mg, multivitamins adult 10 mL infusion ( Intravenous New Bag/Given 02/04/19 0029)  potassium  chloride 10 mEq in 100 mL IVPB ( Intravenous Stopped 02/04/19 0154)  0.9 %  sodium chloride infusion ( Intravenous New Bag/Given 02/04/19 0412)    Mobility walks High fall risk

## 2019-02-21 ENCOUNTER — Emergency Department (HOSPITAL_COMMUNITY): Payer: Self-pay

## 2019-02-21 ENCOUNTER — Emergency Department (HOSPITAL_COMMUNITY)
Admission: EM | Admit: 2019-02-21 | Discharge: 2019-02-22 | Disposition: A | Discharge status: Home / Self Care | Payer: Self-pay | Attending: Emergency Medicine | Admitting: Emergency Medicine

## 2019-02-21 DIAGNOSIS — Z20828 Contact with and (suspected) exposure to other viral communicable diseases: Secondary | ICD-10-CM | POA: Insufficient documentation

## 2019-02-21 DIAGNOSIS — F10229 Alcohol dependence with intoxication, unspecified: Secondary | ICD-10-CM | POA: Diagnosis present

## 2019-02-21 DIAGNOSIS — F332 Major depressive disorder, recurrent severe without psychotic features: Secondary | ICD-10-CM | POA: Insufficient documentation

## 2019-02-21 DIAGNOSIS — F17210 Nicotine dependence, cigarettes, uncomplicated: Secondary | ICD-10-CM | POA: Insufficient documentation

## 2019-02-21 DIAGNOSIS — Z008 Encounter for other general examination: Secondary | ICD-10-CM | POA: Insufficient documentation

## 2019-02-21 DIAGNOSIS — R45851 Suicidal ideations: Secondary | ICD-10-CM | POA: Insufficient documentation

## 2019-02-21 DIAGNOSIS — Y908 Blood alcohol level of 240 mg/100 ml or more: Secondary | ICD-10-CM | POA: Insufficient documentation

## 2019-02-21 DIAGNOSIS — F1010 Alcohol abuse, uncomplicated: Secondary | ICD-10-CM

## 2019-02-21 LAB — CBC WITH DIFFERENTIAL/PLATELET
Abs Immature Granulocytes: 0 10*3/uL (ref 0.00–0.07)
Basophils Absolute: 0 10*3/uL (ref 0.0–0.1)
Basophils Relative: 0 %
Eosinophils Absolute: 0 10*3/uL (ref 0.0–0.5)
Eosinophils Relative: 0 %
HCT: 40.5 % (ref 36.0–46.0)
Hemoglobin: 13.3 g/dL (ref 12.0–15.0)
Immature Granulocytes: 0 %
Lymphocytes Relative: 43 %
Lymphs Abs: 1.9 10*3/uL (ref 0.7–4.0)
MCH: 33 pg (ref 26.0–34.0)
MCHC: 32.8 g/dL (ref 30.0–36.0)
MCV: 100.5 fL — ABNORMAL HIGH (ref 80.0–100.0)
Monocytes Absolute: 0.4 10*3/uL (ref 0.1–1.0)
Monocytes Relative: 8 %
Neutro Abs: 2.2 10*3/uL (ref 1.7–7.7)
Neutrophils Relative %: 49 %
Platelets: 227 10*3/uL (ref 150–400)
RBC: 4.03 MIL/uL (ref 3.87–5.11)
RDW: 13.6 % (ref 11.5–15.5)
WBC: 4.5 10*3/uL (ref 4.0–10.5)
nRBC: 0 % (ref 0.0–0.2)

## 2019-02-21 LAB — SALICYLATE LEVEL: Salicylate Lvl: <7 mg/dL (ref 2.8–30.0)

## 2019-02-21 LAB — COMPREHENSIVE METABOLIC PANEL
ALT: 14 U/L (ref 0–44)
AST: 25 U/L (ref 15–41)
Albumin: 3.8 g/dL (ref 3.5–5.0)
Alkaline Phosphatase: 65 U/L (ref 38–126)
Anion gap: 9 (ref 5–15)
BUN: 12 mg/dL (ref 6–20)
CO2: 30 mmol/L (ref 22–32)
Calcium: 8.5 mg/dL — ABNORMAL LOW (ref 8.9–10.3)
Chloride: 103 mmol/L (ref 98–111)
Creatinine, Ser: 0.78 mg/dL (ref 0.44–1.00)
GFR calc Af Amer: >60 mL/min (ref >60)
GFR calc non Af Amer: >60 mL/min (ref >60)
Glucose, Bld: 78 mg/dL (ref 70–99)
Potassium: 2.7 mmol/L — CL (ref 3.5–5.1)
Sodium: 142 mmol/L (ref 135–145)
Total Bilirubin: 0.5 mg/dL (ref 0.3–1.2)
Total Protein: 6.5 g/dL (ref 6.5–8.1)

## 2019-02-21 LAB — I-STAT BETA HCG BLOOD, ED (MC, WL, AP ONLY): I-stat hCG, quantitative: <5 m[IU]/mL (ref <5)

## 2019-02-21 LAB — CBG MONITORING, ED: Glucose-Capillary: 64 mg/dL — ABNORMAL LOW (ref 70–99)

## 2019-02-21 LAB — ACETAMINOPHEN LEVEL: Acetaminophen (Tylenol), Serum: <10 ug/mL — ABNORMAL LOW (ref 10–30)

## 2019-02-21 LAB — MAGNESIUM: Magnesium: 1.8 mg/dL (ref 1.7–2.4)

## 2019-02-21 LAB — ETHANOL: Alcohol, Ethyl (B): 313 mg/dL (ref <10)

## 2019-02-21 MED ORDER — LORAZEPAM 2 MG/ML IJ SOLN: 0.0000 mg | Freq: Four times a day (QID) | INTRAMUSCULAR | Status: DC

## 2019-02-21 MED ORDER — POTASSIUM CHLORIDE CRYS ER 20 MEQ PO TBCR
40.0000 meq | EXTENDED_RELEASE_TABLET | Freq: Once | ORAL | Status: AC
  Administered 2019-02-22: 40 meq via ORAL
  Filled 2019-02-21 (×2): qty 2

## 2019-02-21 MED ORDER — LORAZEPAM 2 MG/ML IJ SOLN: 0.0000 mg | Freq: Two times a day (BID) | INTRAMUSCULAR | Status: DC

## 2019-02-21 MED ORDER — VITAMIN B-1 100 MG PO TABS
100.0000 mg | ORAL_TABLET | Freq: Every day | ORAL | Status: DC
  Administered 2019-02-22: 100 mg via ORAL
  Filled 2019-02-21 (×2): qty 1

## 2019-02-21 MED ORDER — SODIUM CHLORIDE 0.9 % IV BOLUS
1000.0000 mL | Freq: Once | INTRAVENOUS | Status: AC
  Administered 2019-02-21: 1000 mL via INTRAVENOUS

## 2019-02-21 MED ORDER — SODIUM CHLORIDE 0.9 % IV BOLUS
1000.0000 mL | Freq: Once | INTRAVENOUS | Status: AC
  Administered 2019-02-21: 22:00:00 1000 mL via INTRAVENOUS

## 2019-02-21 MED ORDER — LORAZEPAM 1 MG PO TABS: 0.0000 mg | ORAL_TABLET | Freq: Four times a day (QID) | ORAL | Status: DC

## 2019-02-21 MED ORDER — DEXTROSE 50 % IV SOLN
INTRAVENOUS | Status: AC
  Filled 2019-02-21: qty 50

## 2019-02-21 MED ORDER — LORAZEPAM 1 MG PO TABS: 0.0000 mg | ORAL_TABLET | Freq: Two times a day (BID) | ORAL | Status: DC

## 2019-02-21 MED ORDER — DEXTROSE 50 % IV SOLN
1.0000 | Freq: Once | INTRAVENOUS | Status: AC
  Administered 2019-02-21: 50 mL via INTRAVENOUS

## 2019-02-21 MED ORDER — THIAMINE HCL 100 MG/ML IJ SOLN
100.0000 mg | Freq: Every day | INTRAMUSCULAR | Status: DC
  Administered 2019-02-21: 100 mg via INTRAVENOUS
  Filled 2019-02-21: qty 2

## 2019-02-21 MED ORDER — POTASSIUM CHLORIDE 10 MEQ/100ML IV SOLN
10.0000 meq | INTRAVENOUS | Status: DC
  Administered 2019-02-21 (×2): 10 meq via INTRAVENOUS
  Filled 2019-02-21 (×2): qty 100

## 2019-02-21 MED ORDER — POTASSIUM CHLORIDE 10 MEQ/100ML IV SOLN: 10.0000 meq | Freq: Once | INTRAVENOUS | Status: DC

## 2019-02-21 NOTE — ED Notes (Signed)
Sheila Graves, pt's mother: [phone]

## 2019-02-21 NOTE — ED Notes (Signed)
Patient refused apple sauce and orange juice

## 2019-02-21 NOTE — ED Triage Notes (Addendum)
Arrived by [facility] found in neighbors yard minimally responsive. Patient consumed approximately 1.5 pint of Vodka. Initial O2 saturation was 87-89% on room air, placed on 2L/[place] increased to 98%. Patient received 1L NS bolus en route to this facility for Hypotension 82/44. Bolus of NS increased BP to 87/50. CBG 80. EMS reports that patient endorses SI. Patient's family called and said she had to take a knife from pt today because she was threatening to cut her wrists. After taking the knife away family member states patient stole money from her to purchase alcohol and told her she would "drink myself to death". Patient denies SI after arriving to facility.

## 2019-02-21 NOTE — ED Provider Notes (Signed)
[facility] DEPT Provider Note   CSN: [id number] Arrival date & time: 02/21/19  1856     History   Chief Complaint Chief Complaint  Patient presents with  . Alcohol Intoxication  . Suicidal    HPI Sheila Graves is a 38 y.o. female.     HPI  38 year old female with history of EtOH abuse, alcohol withdrawal seizure, hypokalemia, who presents to the emergency department today for evaluation of alcohol intoxication and suicidality.  Patient brought in by [facility] and they are at bedside and assist with the history.  They state that patient was found several houses away from her own passed out in the front lawn.  He states she consumed 1.5 pints of vodka.  Initial O2 saturation was 87 to 89% on room air.  Was placed on 2 L nasal cannula and increased to 98%.  Patient received 1 L normal saline bolus prior to arrival.  CBG 80.  They state that patient has been endorsing suicidality per her significant other.  Per EMS family was concerned because patient had a knife today and was threatening to cut her wrists.  She later stated that she wanted to drink herself to death.  History is somewhat limited as patient is not participating.  Past Medical History:  Diagnosis Date  . Alcohol withdrawal seizure ([place])   . ETOH abuse   . Hypokalemia   . Pancreatitis   . Thyroid disease     Patient Active Problem List   Diagnosis Date Noted  . Alcohol intoxication ([place]) 02/04/2019  . Hypokalemia 02/04/2019  . Hypothermia 02/04/2019  . Hypotension 02/04/2019  . Fracture of clavicle, right, open 11/11/2018  . Alcohol dependence with withdrawal ([place]) 01/31/2018  . Major depressive disorder, recurrent, severe without psychotic features ([place]) 01/31/2018    Past Surgical History:  Procedure Laterality Date  . FINGER SURGERY    . IRRIGATION AND DEBRIDEMENT SHOULDER Right 11/12/2018   Procedure: Irrigation And Debridement Shoulder;  Surgeon: Shona Needles, MD;  Location:  [facility];  Service: Orthopedics;  Laterality: Right;  . ORIF CLAVICULAR FRACTURE Right 11/12/2018   Procedure: OPEN REDUCTION INTERNAL FIXATION (ORIF) CLAVICULAR FRACTURE;  Surgeon: Shona Needles, MD;  Location: [facility];  Service: Orthopedics;  Laterality: Right;     OB History   No obstetric history on file.      Home Medications    Prior to Admission medications   Medication Sig Start Date End Date Taking? Authorizing Provider  acetaminophen (TYLENOL) 500 MG tablet Take 1 tablet (500 mg total) by mouth every 12 (twelve) hours. Patient not taking: Reported on 01/18/2019 11/12/18   Delray Alt, PA-C  chlordiazePOXIDE (LIBRIUM) 25 MG capsule 50mg  PO TID x 1D, then 25-50mg  PO BID X 1D, then 25-50mg  PO QD X 1D Patient not taking: Reported on 02/04/2019 01/18/19   Henderly, Britni A, PA-C    Family History No family history on file.  Social History Social History   Tobacco Use  . Smoking status: Current Every Day Smoker    Packs/day: 1.00  . Smokeless tobacco: Never Used  Substance Use Topics  . Alcohol use: Yes    Comment: fifth of liquor  . Drug use: Not Currently    Types: Heroin     Allergies   Patient has no known allergies.   Review of Systems Review of Systems  Reason unable to perform ROS: intoxicated, refusing to answer questions.     Physical Exam Updated Vital Signs BP 93/70  Pulse 79   Temp 97.6 F (36.4 C) (Oral)   Resp 19   LMP 02/14/2019   SpO2 99%   Physical Exam Vitals signs and nursing note reviewed.  Constitutional:      General: She is not in acute distress.    Appearance: She is well-developed.  HENT:     Head: Normocephalic and atraumatic.  Eyes:     Conjunctiva/sclera: Conjunctivae normal.  Neck:     Musculoskeletal: Neck supple.  Cardiovascular:     Rate and Rhythm: Normal rate and regular rhythm.     Heart sounds: Normal heart sounds. No murmur.  Pulmonary:     Effort: Pulmonary effort is normal. No respiratory distress.      Breath sounds: Normal breath sounds. No wheezing, rhonchi or rales.  Abdominal:     General: Bowel sounds are normal. There is no distension.     Palpations: Abdomen is soft.     Tenderness: There is no abdominal tenderness. There is no guarding or rebound.  Musculoskeletal:     Comments: TTP to the cervical spine.  Skin:    General: Skin is warm and dry.  Neurological:     Mental Status: She is alert.     Comments: Somnolent but arousable to voice, refuses to follow most commands but its able to move all extremities. No facial droop. Clear speech.     ED Treatments / Results  Labs (all labs ordered are listed, but only abnormal results are displayed) Labs Reviewed  CBC WITH DIFFERENTIAL/PLATELET - Abnormal; Notable for the following components:      Result Value   MCV 100.5 (*)    All other components within normal limits  COMPREHENSIVE METABOLIC PANEL - Abnormal; Notable for the following components:   Potassium 2.7 (*)    Calcium 8.5 (*)    All other components within normal limits  ETHANOL - Abnormal; Notable for the following components:   Alcohol, Ethyl (B) 313 (*)    All other components within normal limits  ACETAMINOPHEN LEVEL - Abnormal; Notable for the following components:   Acetaminophen (Tylenol), Serum <10 (*)    All other components within normal limits  CBG MONITORING, ED - Abnormal; Notable for the following components:   Glucose-Capillary 64 (*)    All other components within normal limits  SALICYLATE LEVEL  MAGNESIUM  RAPID URINE DRUG SCREEN, HOSP PERFORMED  I-STAT BETA HCG BLOOD, ED ([facility], [facility], [facility] ONLY)  CBG MONITORING, ED    EKG EKG Interpretation  Date/Time:  Tuesday February 21 2019 21:35:07 EDT Ventricular Rate:  76 PR Interval:    QRS Duration: 102 QT Interval:  416 QTC Calculation: 468 R Axis:   76 Text Interpretation:  Sinus rhythm No acute changes No old tracing to compare Confirmed by Derwood KaplanNanavati, Ankit [phone] on 02/21/2019 11:13:02 PM    Radiology Ct Head Wo Contrast  Result Date: 02/21/2019 CLINICAL DATA:  Unresponsive EXAM: CT HEAD WITHOUT CONTRAST CT CERVICAL SPINE WITHOUT CONTRAST TECHNIQUE: Multidetector CT imaging of the head and cervical spine was performed following the standard protocol without intravenous contrast. Multiplanar CT image reconstructions of the cervical spine were also generated. COMPARISON:  CT 02/04/2019, 01/23/2019 FINDINGS: CT HEAD FINDINGS Brain: No evidence of acute infarction, hemorrhage, hydrocephalus, extra-axial collection or mass lesion/mass effect. Vascular: No hyperdense vessel or unexpected calcification. Skull: Normal. Negative for fracture or focal lesion. Sinuses/Orbits: Mucosal thickening in the ethmoid sinuses Other: None CT CERVICAL SPINE FINDINGS Alignment: Unable to adequately evaluate C1-C2 articulation due to  marked degree of head rotation. Remainder of the cervical alignment is within normal limits. Facet alignment is within normal limits. Skull base and vertebrae: No acute fracture. No primary bone lesion or focal pathologic process. Soft tissues and spinal canal: No prevertebral fluid or swelling. No visible canal hematoma. Disc levels: Mild posterior spurring at C3-C4. Disc spaces are maintained. Upper chest: Negative. Other: None IMPRESSION: 1. Negative non contrasted CT appearance of the brain. 2. No obvious fracture. Unable to adequately evaluate C1-C2 articulation, secondary to severe degree of head rotation and limited utility of the coronal and sagittal views of this region. Rotary subluxation is unable to be excluded on the current images. Electronically Signed   By: Jasmine Pang M.D.   On: 02/21/2019 22:09   Ct Cervical Spine Wo Contrast  Result Date: 02/21/2019 CLINICAL DATA:  Unresponsive EXAM: CT HEAD WITHOUT CONTRAST CT CERVICAL SPINE WITHOUT CONTRAST TECHNIQUE: Multidetector CT imaging of the head and cervical spine was performed following the standard protocol without  intravenous contrast. Multiplanar CT image reconstructions of the cervical spine were also generated. COMPARISON:  CT 02/04/2019, 01/23/2019 FINDINGS: CT HEAD FINDINGS Brain: No evidence of acute infarction, hemorrhage, hydrocephalus, extra-axial collection or mass lesion/mass effect. Vascular: No hyperdense vessel or unexpected calcification. Skull: Normal. Negative for fracture or focal lesion. Sinuses/Orbits: Mucosal thickening in the ethmoid sinuses Other: None CT CERVICAL SPINE FINDINGS Alignment: Unable to adequately evaluate C1-C2 articulation due to marked degree of head rotation. Remainder of the cervical alignment is within normal limits. Facet alignment is within normal limits. Skull base and vertebrae: No acute fracture. No primary bone lesion or focal pathologic process. Soft tissues and spinal canal: No prevertebral fluid or swelling. No visible canal hematoma. Disc levels: Mild posterior spurring at C3-C4. Disc spaces are maintained. Upper chest: Negative. Other: None IMPRESSION: 1. Negative non contrasted CT appearance of the brain. 2. No obvious fracture. Unable to adequately evaluate C1-C2 articulation, secondary to severe degree of head rotation and limited utility of the coronal and sagittal views of this region. Rotary subluxation is unable to be excluded on the current images. Electronically Signed   By: Jasmine Pang M.D.   On: 02/21/2019 22:09    Procedures Procedures (including critical care time)  Medications Ordered in ED Medications  LORazepam (ATIVAN) injection 0-4 mg (0 mg Intravenous Hold 02/21/19 2240)    Or  LORazepam (ATIVAN) tablet 0-4 mg ( Oral See Alternative 02/21/19 2240)  LORazepam (ATIVAN) injection 0-4 mg (has no administration in time range)    Or  LORazepam (ATIVAN) tablet 0-4 mg (has no administration in time range)  thiamine (VITAMIN B-1) tablet 100 mg ( Oral See Alternative 02/21/19 2231)    Or  thiamine (B-1) injection 100 mg (100 mg Intravenous Given  02/21/19 2231)  potassium chloride 10 mEq in 100 mL IVPB ( Intravenous Canceled Entry 02/21/19 2302)  sodium chloride 0.9 % bolus 1,000 mL (0 mLs Intravenous Stopped 02/21/19 2039)  dextrose 50 % solution 50 mL (50 mLs Intravenous Given 02/21/19 2210)  potassium chloride SA (K-DUR) CR tablet 40 mEq (40 mEq Oral Given 02/22/19 0016)  sodium chloride 0.9 % bolus 1,000 mL (0 mLs Intravenous Stopped 02/21/19 2250)     Initial Impression / Assessment and Plan / ED Course  I have reviewed the triage vital signs and the nursing notes.  Pertinent labs & imaging results that were available during my care of the patient were reviewed by me and considered in my medical decision making (see chart for  details).     Final Clinical Impressions(s) / ED Diagnoses   Final diagnoses:  Alcohol abuse  Suicidal ideation   Pt presenting for eval of ETOH intoxication and suicidal ideations.   Labs reviewed: Cbc nonacute CMP with hypokalemia, repleted with IV and PO K. Otherwise nonacute ETOH elevated in the 300s. Acetaminophen and tylenol levels negative UDS pending at shift change  Beta hcg negative   CT head/cervical spine showed negative non contrasted CT appearance of the brain. No obvious fracture. Unable to adequately evaluate C1-C2 articulation, secondary to severe degree of head rotation and limited utility of the coronal and sagittal views of this region. Rotary subluxation is unable to be excluded on the current images.  - Pt placed in c-collar. Will need to reassess cervical spine when clinically sober.  9:43 PM reassesed pt. She is sitting up in bed. She has the covers over her head. She is crying and saying she made a mistake.   12:00 AM reassessed. Pt she is awake and alert.  At shift change, care transitioned to Sharilyn SitesLisa Sanders, PA-C with plan to f/u on repeat potassium and reassessment of her c-spine. She will also need to be evaluated by TTS for her SI. She is not currently IVC'ed but if she  is still endorsing SI and tries to leave I would commit her.  ED Discharge Orders    None       Karrie MeresCouture, Beula Joyner S, [place] JerseyPA-C 02/22/19 0038    Derwood KaplanNanavati, Ankit, MD 02/28/19 1323

## 2019-02-21 NOTE — ED Notes (Signed)
Critical Lab for Potassium of 2.7. Primary RN made aware.

## 2019-02-21 NOTE — BH Assessment (Signed)
[facility] Assessment Progress Note   Per nurse Levada Dy, patient too sleepy to be assessed at this time.  TTS to attempt later when patient is alert & oriented.

## 2019-02-22 ENCOUNTER — Other Ambulatory Visit: Payer: Self-pay

## 2019-02-22 LAB — RAPID URINE DRUG SCREEN, HOSP PERFORMED
Amphetamines: NOT DETECTED
Barbiturates: NOT DETECTED
Benzodiazepines: NOT DETECTED
Cocaine: NOT DETECTED
Opiates: NOT DETECTED
Tetrahydrocannabinol: NOT DETECTED

## 2019-02-22 LAB — I-STAT CHEM 8, ED
BUN: 8 mg/dL (ref 6–20)
Calcium, Ion: 0.98 mmol/L — ABNORMAL LOW (ref 1.15–1.40)
Chloride: 109 mmol/L (ref 98–111)
Creatinine, Ser: 0.8 mg/dL (ref 0.44–1.00)
Glucose, Bld: 65 mg/dL — ABNORMAL LOW (ref 70–99)
HCT: 38 % (ref 36.0–46.0)
Hemoglobin: 12.9 g/dL (ref 12.0–15.0)
Potassium: 4.1 mmol/L (ref 3.5–5.1)
Sodium: 144 mmol/L (ref 135–145)
TCO2: 26 mmol/L (ref 22–32)

## 2019-02-22 LAB — SARS CORONAVIRUS 2 (TAT 6-24 HRS): SARS Coronavirus 2: NEGATIVE

## 2019-02-22 LAB — CBG MONITORING, ED: Glucose-Capillary: 59 mg/dL — ABNORMAL LOW (ref 70–99)

## 2019-02-22 MED ORDER — DEXTROSE 50 % IV SOLN
INTRAVENOUS | Status: AC
  Filled 2019-02-22: qty 50

## 2019-02-22 MED ORDER — DEXTROSE 50 % IV SOLN
1.0000 | Freq: Once | INTRAVENOUS | Status: AC
  Administered 2019-02-22: 50 mL via INTRAVENOUS

## 2019-02-22 NOTE — Discharge Instructions (Addendum)
For your behavioral health needs you are advised to follow up with [facility].  New patients are seen at their walk-in clinic.  Walk-in hours are Monday - Friday from 8:30 am - 12:00 pm, and from 1:00 pm - 2:30 pm.  Walk-in patients are seen on a first come, first served basis, so try to arrive as early as possible for the best chance of being seen the same day:       [facility], [place] [address]      [phone]  To help you maintain a sober lifestyle, a substance abuse treatment program may be beneficial to you.  Contact one of the following facilities at your earliest opportunity to ask about enrolling:  RESIDENTIAL PROGRAMS:       [place], [place] [address]      [phone]       [facility]      [address] [place], [place] [address]      [phone]       [facility]      [place],  [address]      [phone]  OUTPATIENT PROGRAMS:  [facility] also provides substance abuse treatment services.

## 2019-02-22 NOTE — ED Notes (Signed)
Patient given [place] sandwich and Apple Juice

## 2019-02-22 NOTE — ED Notes (Signed)
Pt ambulatory to bathroom

## 2019-02-22 NOTE — BH Assessment (Signed)
[facility] Assessment Progress Note  Per Buford Dresser, DO, this pt does not require psychiatric hospitalization at this time.  Pt is to be discharged from [facility] with mental health and substance abuse treatment referrals.  These have been included in pt's discharge instructions.  Pt would also benefit from seeing Peer Support Specialists; they will be asked to speak to pt.  Pt's nurse has been notified.  Jalene Mullet, [place] Triage Specialist [phone]

## 2019-02-22 NOTE — [facility] Suicide Risk Assessment (Cosign Needed)
Suicide Risk Assessment  Discharge Assessment   [facility]   Principal Problem: Alcohol intoxication (HCC) Discharge Diagnoses: Principal Problem:   Alcohol intoxication (HCC) Active Problems:   Alcohol use disorder, severe, in controlled environment, dependence (HCC)   Total Time spent with patient: 30 minutes  Musculoskeletal: Strength & Muscle Tone: within normal limits Gait & Station: normal Patient leans: N/A  Psychiatric Specialty Exam:   Blood pressure 91/63, pulse 83, temperature 97.6 F (36.4 C), temperature source Oral, resp. rate 17, last menstrual period 02/14/2019, SpO2 97 %.There is no height or weight on file to calculate BMI.  General Appearance: Casual  Eye Contact::  Good  Speech:  Clear and Coherent and Normal Rate409  Volume:  Normal  Mood:  "Better" Appropriate  Affect:  Appropriate and Congruent  Thought Process:  Coherent, Goal Directed and Descriptions of Associations: Intact  Orientation:  Full (Time, Place, and Person)  Thought Content:  WDL  Suicidal Thoughts:  No  Homicidal Thoughts:  No  Memory:  Immediate;   Good Recent;   Good  Judgement:  Intact  Insight:  Present  Psychomotor Activity:  Normal  Concentration:  Good  Recall:  Good  Fund of Knowledge:Good  Language: Good  Akathisia:  No  Handed:  Right  AIMS (if indicated):     Assets:  Communication Skills Desire for Improvement Housing Physical Health Social Support Transportation  Sleep:     Cognition: WNL  ADL's:  Intact   Mental Status Per Nursing Assessment::   On Admission:    Sheila Graves, 38 y.o., female patient seen via tele psych by this provider, Dr. Sharma CovertNorman; and chart reviewed on 02/22/19.  On evaluation Sheila Graves reports "I drank to much yesterday; and when I'm drunk I always say or do things I don't mean.  I don't want to kill myself.  I drank 5 of those little bottles of liquor and then some more liquor out of a bottle."  Patient states  that she doesn't usually get that drunk.  Reports she has spoken to her wife and that we are able to contact her for collateral.  Patient states that she lives with her wife and wife's mother.  States that she is unemployed at this time related to being laid off after Covid.  Patient denies prior psychiatric history, suicide attempt, self harm, and violence.  Patient states that she is interested in outpatient services to help with alcohol problem.  At this time patient denies suicidal/self-harm/homicidal ideation, psychosis, and paranoia.   During evaluation Sheila Graves is alert/oriented x 4; calm/cooperative; and mood is congruent with affect.  She does not appear to be responding to internal/external stimuli or delusional thoughts.  Patient denies suicidal/self-harm/homicidal ideation, psychosis, and paranoia.  Patient answered question appropriately.  Collateral information gathered from patients wife who informs that her main concern is with patients use of alcohol and would like to know if there is a medication that can be given to assist patient once discharged.  States that she has no other concerns; feel the patient is safe to come home. Ordered Peer Support for outpatient resources   Demographic Factors:  Caucasian  Loss Factors: NA  Historical Factors: NA  Risk Reduction Factors:   Sense of responsibility to family, Religious beliefs about death, Living with another person, especially a relative and Positive social support  Continued Clinical Symptoms:  Alcohol/Substance Abuse/Dependencies  Cognitive Features That Contribute To Risk:  None    Suicide Risk:  Minimal:  No identifiable suicidal ideation.  Patients presenting with no risk factors but with morbid ruminations; may be classified as minimal risk based on the severity of the depressive symptoms    Plan Of Care/Follow-up recommendations:  Activity:  As tolerated Diet:  Heart healthy Other:  Follow up with resources  given   Disposition: No evidence of imminent risk to self or others at present.   Patient does not meet criteria for psychiatric inpatient admission. Supportive therapy provided about ongoing stressors. Discussed crisis plan, support from social network, calling 911, coming to the Emergency Department, and calling Suicide Hotline.  Heberto Sturdevant, NP 02/22/2019, 2:03 PM

## 2019-02-22 NOTE — ED Notes (Signed)
Pt given 8oz of orange juice 

## 2019-02-22 NOTE — ED Notes (Signed)
TTS at bedside. 

## 2019-02-22 NOTE — ED Notes (Signed)
Gave report to [place] 

## 2019-02-22 NOTE — BH Assessment (Signed)
Tele Assessment Note   Patient Name: Sheila Graves MRN: [id number] Referring Physician: Leonia Coronaortni Couture, PA Location of Patient: [facility] Location of Provider: Behavioral Health TTS Department  Sheila Graves is an 38 y.o. female.  -Clinician reviewed note by Leonia Coronaortni Couture, PA. Pt is a 38 year old female with history of EtOH abuse, alcohol withdrawal seizure, hypokalemia, who presents to the emergency department today for evaluation of alcohol intoxication and suicidality.  Patient brought in by [employer] and they are at bedside and assist with the history.  They state that patient was found several houses away from her own passed out in the front lawn.  He states she consumed 1.5 pints of vodka.  Initial O2 saturation was 87 to 89% on room air.  Was placed on 2 L nasal cannula and increased to 98%.  Patient received 1 L normal saline bolus prior to arrival.  CBG 80.  They state that patient has been endorsing suicidality per her significant other.  Per EMS family was concerned because patient had a knife today and was threatening to cut her wrists.  She later stated that she wanted to drink herself to death.  When clinician came on shift, this patient's wife had called the crisis line to talk about patient's drinking and threats to kill herself.  She said that patient had threatened to kill herself w/ a knife last night and had left the house when the knife was taken from her.  At that time she said she would drink herself to death.  Patient had stolen money to get more ETOH and had consumed about 1.5 pints of vodka.  Wife is concerned that patient is trying to kill herself.  She is also worried that once patient is sober she will deny everything and be discharged.  When this clinician talked to patient she denied any SI, HI or A/V hallucinations.  Patient says that the part of  Story about the knife is untrue and her wife is trying to get her in trouble.  Patient however did not know how much ETOH she had consumed  nor did she know how she got to the hospital.  When she was told she said, "Oh, I don't remember."    Patient says she does not drink every day.  Her BAL was 313 at 19:43 on 08/25.  Patient has no other drugs that she uses.  She has a pending DUI charge.  She admits to being depressed and drinking because she is bored.  She said that she and wife live with wife's mother and that they argue all the time.  This is stressful to her.  Patient's mother was contacted for collateral when wife was unavailable.  Mother said that patient has been making suicidal statements even when sober.  She has been in and out of rehabilitation centers.  She just left [facility] last week early and went home to spouse and started drinking again.  Patient has had multiple DUIs according to mother.  -Clinician discussed with Sherron Flemingsashawn Dixon, NP who recommends AM psych eval.  Spoke also with Sharilyn SitesLisa Sanders, PA who took over for Cortni and who said the patient needed to be IVC'ed if she attempted to leave.  Diagnosis: F33.2 MDD recurrent, severe; F10.20 ETOH use d/o severe  Past Medical History:  Past Medical History:  Diagnosis Date  . Alcohol withdrawal seizure (HCC)   . ETOH abuse   . Hypokalemia   . Pancreatitis   . Thyroid disease     Past Surgical History:  Procedure Laterality Date  . FINGER SURGERY    . IRRIGATION AND DEBRIDEMENT SHOULDER Right 11/12/2018   Procedure: Irrigation And Debridement Shoulder;  Surgeon: Roby Lofts, MD;  Location: [facility] OR;  Service: Orthopedics;  Laterality: Right;  . ORIF CLAVICULAR FRACTURE Right 11/12/2018   Procedure: OPEN REDUCTION INTERNAL FIXATION (ORIF) CLAVICULAR FRACTURE;  Surgeon: Roby Lofts, MD;  Location: [facility] OR;  Service: Orthopedics;  Laterality: Right;    Family History: No family history on file.  Social History:  reports that she has been smoking. She has been smoking about 1.00 pack per day. She has never used smokeless tobacco. She reports current alcohol use.  She reports previous drug use. Drug: Heroin.  Additional Social History:  Substance #1 Name of Substance 1: ETOH 1 - Age of First Use: Teens 1 - Amount (size/oz): Over a pint at a time 1 - Frequency: Pt reports every few days 1 - Duration: off and on 1 - Last Use / Amount: 08/25  CIWA: CIWA-Ar BP: (!) 94/59 Pulse Rate: 99 Nausea and Vomiting: 3 Tactile Disturbances: none Tremor: no tremor Auditory Disturbances: not present Paroxysmal Sweats: no sweat visible Visual Disturbances: not present Anxiety: two Headache, Fullness in Head: none present Agitation: normal activity Orientation and Clouding of Sensorium: oriented and can do serial additions CIWA-Ar Total: 5 COWS:    Allergies: No Known Allergies  Home Medications: (Not in a hospital admission)   OB/GYN Status:  Patient's last menstrual period was 02/14/2019.  General Assessment Data Assessment unable to be completed: Yes Reason for not completing assessment: Pt too inebriated & sleepy to assess Location of Assessment: [facility] TTS Assessment: In system Is this a Tele or Face-to-Face Assessment?: Tele Assessment Is this an Initial Assessment or a Re-assessment for this encounter?: Initial Assessment Patient Accompanied by:: N/A Language Other than English: No Living Arrangements: Other (Comment)(Patient lives w/ wife and mother in Social [place]) What gender do you identify as?: Female Marital status: Married Pregnancy Status: No Living Arrangements: Spouse/significant other Can pt return to current living arrangement?: Yes Admission Status: Voluntary Petitioner: Other Is patient capable of signing voluntary admission?: Yes Referral Source: Self/Family/Friend Insurance type: self     Crisis Care Plan Living Arrangements: Spouse/significant other Name of Psychiatrist: None Name of Therapist: None  Education Status Is patient currently in school?: No Is the patient employed, unemployed or receiving disability?:  Unemployed  Risk to self with the past 6 months Suicidal Ideation: No Has patient been a risk to self within the past 6 months prior to admission? : Yes Suicidal Intent: No Has patient had any suicidal intent within the past 6 months prior to admission? : No Is patient at risk for suicide?: No(Pt reportedly made threats to cut herself or drink to death) Suicidal Plan?: No(Wife said patient has threatened to cut herself.) Has patient had any suicidal plan within the past 6 months prior to admission? : No Access to Means: No What has been your use of drugs/alcohol within the last 12 months?: ETOH Previous Attempts/Gestures: No How many times?: 0 Other Self Harm Risks: N/A Triggers for Past Attempts: None known Intentional Self Injurious Behavior: None Family Suicide History: No Recent stressful life event(s): Conflict (Comment), Turmoil (Comment)(Conflict w/ wife) Persecutory voices/beliefs?: No Depression: Yes Depression Symptoms: Loss of interest in usual pleasures, Insomnia, Despondent Substance abuse history and/or treatment for substance abuse?: Yes Suicide prevention information given to non-admitted patients: Not applicable  Risk to Others within the past 6 months Homicidal Ideation: No  Does patient have any lifetime risk of violence toward others beyond the six months prior to admission? : No Thoughts of Harm to Others: No Current Homicidal Intent: No Current Homicidal Plan: No Access to Homicidal Means: No Identified Victim: No one History of harm to others?: No Assessment of Violence: None Noted Violent Behavior Description: None reported Does patient have access to weapons?: No Criminal Charges Pending?: Yes Describe Pending Criminal Charges: DUI Does patient have a court date: Yes Court Date: ("Sometime in October") Is patient on probation?: No  Psychosis Hallucinations: None noted Delusions: None noted  Mental Status Report Appearance/Hygiene: Unremarkable,  In scrubs Eye Contact: Good Motor Activity: Freedom of movement, Unremarkable Speech: Logical/coherent Level of Consciousness: Alert, Irritable Mood: Depressed, Sad, Anxious Affect: Anxious, Sad Anxiety Level: Minimal Thought Processes: Coherent, Relevant Judgement: Impaired Orientation: Person, Place, Situation, Time Obsessive Compulsive Thoughts/Behaviors: None  Cognitive Functioning Concentration: Normal Memory: Remote Intact, Recent Intact Is patient IDD: No Insight: Poor Impulse Control: Poor Appetite: Fair Have you had any weight changes? : No Change Sleep: Decreased Total Hours of Sleep: (<4H/D) Vegetative Symptoms: None  ADLScreening [facility]) Patient's cognitive ability adequate to safely complete daily activities?: Yes Patient able to express need for assistance with ADLs?: Yes Independently performs ADLs?: Yes (appropriate for developmental age)  Prior Inpatient Therapy Prior Inpatient Therapy: Yes Prior Therapy Dates: 2440(1027) Prior Therapy Facilty/Provider(s): [facility] Reason for Treatment: SA  Prior Outpatient Therapy Prior Outpatient Therapy: No Does patient have an ACCT team?: No Does patient have Intensive In-House Services?  : No Does patient have [facility] services? : No Does patient have P4CC services?: No  ADL Screening (condition at time of admission) Patient's cognitive ability adequate to safely complete daily activities?: Yes Is the patient deaf or have difficulty hearing?: No Does the patient have difficulty seeing, even when wearing glasses/contacts?: No Does the patient have difficulty concentrating, remembering, or making decisions?: No Patient able to express need for assistance with ADLs?: Yes Does the patient have difficulty dressing or bathing?: No Independently performs ADLs?: Yes (appropriate for developmental age) Does the patient have difficulty walking or climbing stairs?: No Weakness of Legs: None Weakness of  Arms/Hands: None  Home Assistive Devices/Equipment Home Assistive Devices/Equipment: None    Abuse/Neglect Assessment (Assessment to be complete while patient is alone) Physical Abuse: Denies Verbal Abuse: Denies Sexual Abuse: Denies Exploitation of patient/patient's resources: Denies     Regulatory affairs officer (For Healthcare) Does Patient Have a Medical Advance Directive?: No Would patient like information on creating a medical advance directive?: No - Patient declined          Disposition:  Disposition Initial Assessment Completed for this Encounter: Yes Patient referred to: Other (Comment)(AM psych eval)  This service was provided via telemedicine using a 2-way, interactive audio and video technology.  Names of all persons participating in this telemedicine service and their role in this encounter. Name: Sheila Graves Role: patient  Name: Dallas Breeding Role: mother  Name: Curlene Dolphin, M.S. LCAS QP Role: clinician  Name:  Role:     Raymondo Band 02/22/2019 6:22 AM

## 2019-02-22 NOTE — ED Provider Notes (Signed)
  Assumed care from [place] at shift change.  See prior notes for full H&P.  Briefly, 38 y.o. F here acutely intoxicated with suicidal ideation.  Patient was intoxicated today and found passed out on neighbors lawn.  Significant other reports SI, actually had to take a knife away from her today and voiced wanting to drink herself to death.  Labs with low K+ but on repeat this has improved.  CT Head/cervical spine overall reassuring but unable to completely evaluate C1-C2 due to positioning.  Patient remains in c-collar currently.  Plan:  Will need to sober to participate in TTS.  Will also re-check neck for focal tenderness.  If still tender, would leave in cervical collar and follow-up as an OP.   6:27 AM Discussed with TTS-- patient denying SI or making statements during assessment however TTS has spoken with wife and patient's mother for collateral information and both parties state she has been making statements about wanting to kill herself and confirm the incident with the knife from yesterday. Recommendation is for AM psych assessment.    6:33 AM Went to speak with patient and she is not wearing cervical collar, this is in the floor.  RN reports she removed this a while ago and threw it in the floor.  When I talk with her she now is denying neck pain. No acute deformity or focal tenderness. She is moving her arms and legs well, has ambulated to the bathroom twice without issue.  She is not displaying any signs/symptoms concerning for cauda equina or other acute spinal pathology.  Feel it is reasonable to discontinue c-collar at this time.    Care will be signed out to morning team to follow-up on AM psych evaluation.   Larene Pickett, PA-C 02/22/19 1833    Merryl Hacker, MD 02/25/19 [id number]

## 2019-02-22 NOTE — Patient Outreach (Signed)
ED Peer Support Specialist Patient Intake (Complete at intake & 30-60 Day Follow-up)  Name: Sheila Graves  MRN: [id number]  Age: 38 y.o.   Date of Admission: 02/22/2019  Intake: Initial Comments:      Primary Reason Admitted: Alcohol Intoxication, Suicidal   Lab values: Alcohol/ETOH: Positive Positive UDS? No Amphetamines: No Barbiturates: No Benzodiazepines: No Cocaine: No Opiates: No Cannabinoids: No  Demographic information: Gender: Female Ethnicity: White Marital Status: Married Insurance underwriter Status: Uninsured/Self-pay Ecologist (Work Neurosurgeon, Physicist, medical, etc.: No Lives with: Partner/Spouse Living situation: House/Apartment  Reported Patient History: Patient reported health conditions: None Patient aware of HIV and hepatitis status: No  In past year, has patient visited ED for any reason? Yes  Number of ED visits: 2  Reason(s) for visit: Same situation  In past year, has patient been hospitalized for any reason? No  Number of hospitalizations:    Reason(s) for hospitalization:    In past year, has patient been arrested? Yes  Number of arrests: 1  Reason(s) for arrest: DUI  In past year, has patient been incarcerated? No  Number of incarcerations:    Reason(s) for incarceration:    In past year, has patient received medication-assisted treatment? No  In past year, patient received the following treatments: Residential treatment (non-hospital)  In past year, has patient received any harm reduction services? No  Did this include any of the following?    In past year, has patient received care from a mental health provider for diagnosis other than SUD? No  In past year, is this first time patient has overdosed? No  Number of past overdoses:    In past year, is this first time patient has been hospitalized for an overdose?    Number of hospitalizations for overdose(s):    Is patient currently receiving treatment for a  mental health diagnosis? No  Patient reports experiencing difficulty participating in SUD treatment: No    Most important reason(s) for this difficulty?    Has patient received prior services for treatment? No  In past, patient has received services from following agencies:    Plan of Care:  Suggested follow up at these agencies/treatment centers: [facility]), Other (comment)  Other information: CPSS met with Pt and was able to process with Pt to gain information to better assist Pt. CPSS was made aware that Pt understands how serious the situation is and what she needs to do to better the quality of her life.Pt stated that she tried [facility] services and left before completing. Pt stated that she wants to change her life but Pt has to be willing to change. CPSS John left Pt with resources for facilities that Pt can contact for assistance, CPSS also left contact information for Pt to stay in contact with CPSS in the community.   Aaron Edelman Twan Harkin, [place]  02/22/2019 2:23 PM

## 2019-03-15 ENCOUNTER — Emergency Department (HOSPITAL_COMMUNITY): Payer: Self-pay

## 2019-03-15 ENCOUNTER — Inpatient Hospital Stay (HOSPITAL_COMMUNITY)
Admission: EM | Admit: 2019-03-15 | Discharge: 2019-03-21 | DRG: 871 | Disposition: A | Discharge status: Home / Self Care | Payer: Self-pay | Attending: Internal Medicine | Admitting: Internal Medicine

## 2019-03-15 ENCOUNTER — Other Ambulatory Visit: Payer: Self-pay

## 2019-03-15 DIAGNOSIS — R402232 Coma scale, best verbal response, inappropriate words, at arrival to emergency department: Secondary | ICD-10-CM | POA: Diagnosis present

## 2019-03-15 DIAGNOSIS — F10239 Alcohol dependence with withdrawal, unspecified: Secondary | ICD-10-CM | POA: Diagnosis present

## 2019-03-15 DIAGNOSIS — F1020 Alcohol dependence, uncomplicated: Secondary | ICD-10-CM | POA: Diagnosis present

## 2019-03-15 DIAGNOSIS — F332 Major depressive disorder, recurrent severe without psychotic features: Secondary | ICD-10-CM | POA: Diagnosis present

## 2019-03-15 DIAGNOSIS — R7401 Elevation of levels of liver transaminase levels: Secondary | ICD-10-CM | POA: Diagnosis present

## 2019-03-15 DIAGNOSIS — F17210 Nicotine dependence, cigarettes, uncomplicated: Secondary | ICD-10-CM | POA: Diagnosis present

## 2019-03-15 DIAGNOSIS — Z20828 Contact with and (suspected) exposure to other viral communicable diseases: Secondary | ICD-10-CM | POA: Diagnosis present

## 2019-03-15 DIAGNOSIS — E870 Hyperosmolality and hypernatremia: Secondary | ICD-10-CM | POA: Diagnosis present

## 2019-03-15 DIAGNOSIS — N170 Acute kidney failure with tubular necrosis: Secondary | ICD-10-CM | POA: Diagnosis present

## 2019-03-15 DIAGNOSIS — E872 Acidosis, unspecified: Secondary | ICD-10-CM | POA: Diagnosis present

## 2019-03-15 DIAGNOSIS — R402122 Coma scale, eyes open, to pain, at arrival to emergency department: Secondary | ICD-10-CM | POA: Diagnosis present

## 2019-03-15 DIAGNOSIS — Z79899 Other long term (current) drug therapy: Secondary | ICD-10-CM

## 2019-03-15 DIAGNOSIS — A419 Sepsis, unspecified organism: Principal | ICD-10-CM | POA: Diagnosis present

## 2019-03-15 DIAGNOSIS — R6521 Severe sepsis with septic shock: Secondary | ICD-10-CM | POA: Diagnosis present

## 2019-03-15 DIAGNOSIS — E039 Hypothyroidism, unspecified: Secondary | ICD-10-CM | POA: Diagnosis present

## 2019-03-15 DIAGNOSIS — F10939 Alcohol use, unspecified with withdrawal, unspecified: Secondary | ICD-10-CM | POA: Diagnosis present

## 2019-03-15 DIAGNOSIS — M6282 Rhabdomyolysis: Secondary | ICD-10-CM | POA: Diagnosis present

## 2019-03-15 DIAGNOSIS — D696 Thrombocytopenia, unspecified: Secondary | ICD-10-CM | POA: Diagnosis present

## 2019-03-15 DIAGNOSIS — F10229 Alcohol dependence with intoxication, unspecified: Secondary | ICD-10-CM | POA: Diagnosis present

## 2019-03-15 DIAGNOSIS — F5104 Psychophysiologic insomnia: Secondary | ICD-10-CM | POA: Diagnosis present

## 2019-03-15 DIAGNOSIS — R400 Somnolence: Secondary | ICD-10-CM

## 2019-03-15 DIAGNOSIS — E876 Hypokalemia: Secondary | ICD-10-CM | POA: Diagnosis present

## 2019-03-15 DIAGNOSIS — K7010 Alcoholic hepatitis without ascites: Secondary | ICD-10-CM | POA: Diagnosis present

## 2019-03-15 DIAGNOSIS — D649 Anemia, unspecified: Secondary | ICD-10-CM | POA: Diagnosis present

## 2019-03-15 DIAGNOSIS — E860 Dehydration: Secondary | ICD-10-CM | POA: Diagnosis present

## 2019-03-15 DIAGNOSIS — E611 Iron deficiency: Secondary | ICD-10-CM | POA: Diagnosis present

## 2019-03-15 DIAGNOSIS — E874 Mixed disorder of acid-base balance: Secondary | ICD-10-CM | POA: Diagnosis present

## 2019-03-15 DIAGNOSIS — G9341 Metabolic encephalopathy: Secondary | ICD-10-CM | POA: Diagnosis present

## 2019-03-15 DIAGNOSIS — R9431 Abnormal electrocardiogram [ECG] [EKG]: Secondary | ICD-10-CM | POA: Diagnosis present

## 2019-03-15 DIAGNOSIS — R402362 Coma scale, best motor response, obeys commands, at arrival to emergency department: Secondary | ICD-10-CM | POA: Diagnosis present

## 2019-03-15 DIAGNOSIS — N179 Acute kidney failure, unspecified: Secondary | ICD-10-CM | POA: Diagnosis present

## 2019-03-15 DIAGNOSIS — I959 Hypotension, unspecified: Secondary | ICD-10-CM | POA: Diagnosis present

## 2019-03-15 DIAGNOSIS — Z781 Physical restraint status: Secondary | ICD-10-CM

## 2019-03-15 DIAGNOSIS — F10929 Alcohol use, unspecified with intoxication, unspecified: Secondary | ICD-10-CM

## 2019-03-15 LAB — CBC WITH DIFFERENTIAL/PLATELET
Abs Immature Granulocytes: 0.16 10*3/uL — ABNORMAL HIGH (ref 0.00–0.07)
Basophils Absolute: 0 10*3/uL (ref 0.0–0.1)
Basophils Relative: 0 %
Eosinophils Absolute: 0 10*3/uL (ref 0.0–0.5)
Eosinophils Relative: 0 %
HCT: 45.1 % (ref 36.0–46.0)
Hemoglobin: 14.3 g/dL (ref 12.0–15.0)
Immature Granulocytes: 1 %
Lymphocytes Relative: 13 %
Lymphs Abs: 2.4 10*3/uL (ref 0.7–4.0)
MCH: 33 pg (ref 26.0–34.0)
MCHC: 31.7 g/dL (ref 30.0–36.0)
MCV: 104.2 fL — ABNORMAL HIGH (ref 80.0–100.0)
Monocytes Absolute: 1.1 10*3/uL — ABNORMAL HIGH (ref 0.1–1.0)
Monocytes Relative: 6 %
Neutro Abs: 14.5 10*3/uL — ABNORMAL HIGH (ref 1.7–7.7)
Neutrophils Relative %: 80 %
Platelets: 302 10*3/uL (ref 150–400)
RBC: 4.33 MIL/uL (ref 3.87–5.11)
RDW: 13.7 % (ref 11.5–15.5)
WBC: 18.2 10*3/uL — ABNORMAL HIGH (ref 4.0–10.5)
nRBC: 0 % (ref 0.0–0.2)

## 2019-03-15 LAB — COMPREHENSIVE METABOLIC PANEL
ALT: 57 U/L — ABNORMAL HIGH (ref 0–44)
AST: 193 U/L — ABNORMAL HIGH (ref 15–41)
Albumin: 3.8 g/dL (ref 3.5–5.0)
Alkaline Phosphatase: 92 U/L (ref 38–126)
Anion gap: 36 — ABNORMAL HIGH (ref 5–15)
BUN: 39 mg/dL — ABNORMAL HIGH (ref 6–20)
CO2: 11 mmol/L — ABNORMAL LOW (ref 22–32)
Calcium: 7.3 mg/dL — ABNORMAL LOW (ref 8.9–10.3)
Chloride: 95 mmol/L — ABNORMAL LOW (ref 98–111)
Creatinine, Ser: 2.37 mg/dL — ABNORMAL HIGH (ref 0.44–1.00)
GFR calc Af Amer: 30 mL/min — ABNORMAL LOW (ref >60)
GFR calc non Af Amer: 26 mL/min — ABNORMAL LOW (ref >60)
Glucose, Bld: 82 mg/dL (ref 70–99)
Potassium: 3.6 mmol/L (ref 3.5–5.1)
Sodium: 142 mmol/L (ref 135–145)
Total Bilirubin: 0.4 mg/dL (ref 0.3–1.2)
Total Protein: 6.5 g/dL (ref 6.5–8.1)

## 2019-03-15 LAB — RAPID URINE DRUG SCREEN, HOSP PERFORMED
Amphetamines: NOT DETECTED
Barbiturates: NOT DETECTED
Benzodiazepines: NOT DETECTED
Cocaine: NOT DETECTED
Opiates: NOT DETECTED
Tetrahydrocannabinol: NOT DETECTED

## 2019-03-15 LAB — SALICYLATE LEVEL: Salicylate Lvl: <7 mg/dL (ref 2.8–30.0)

## 2019-03-15 LAB — ACETAMINOPHEN LEVEL: Acetaminophen (Tylenol), Serum: <10 ug/mL — ABNORMAL LOW (ref 10–30)

## 2019-03-15 LAB — URINALYSIS, ROUTINE W REFLEX MICROSCOPIC
Bilirubin Urine: NEGATIVE
Cellular Cast, UA: 35
Glucose, UA: NEGATIVE mg/dL
Ketones, ur: 20 mg/dL — AB
Leukocytes,Ua: NEGATIVE
Nitrite: NEGATIVE
Protein, ur: 100 mg/dL — AB
Specific Gravity, Urine: 1.018 (ref 1.005–1.030)
pH: 5 (ref 5.0–8.0)

## 2019-03-15 LAB — SODIUM, URINE, RANDOM: Sodium, Ur: 31 mmol/L

## 2019-03-15 LAB — PREGNANCY, URINE: Preg Test, Ur: NEGATIVE

## 2019-03-15 LAB — ETHANOL: Alcohol, Ethyl (B): 451 mg/dL (ref <10)

## 2019-03-15 LAB — MAGNESIUM: Magnesium: 2.5 mg/dL — ABNORMAL HIGH (ref 1.7–2.4)

## 2019-03-15 LAB — LIPASE, BLOOD: Lipase: 17 U/L (ref 11–51)

## 2019-03-15 LAB — TROPONIN I (HIGH SENSITIVITY): Troponin I (High Sensitivity): 7 ng/L (ref <18)

## 2019-03-15 LAB — CREATININE, URINE, RANDOM: Creatinine, Urine: 166.66 mg/dL

## 2019-03-15 MED ORDER — SODIUM CHLORIDE 0.9 % IV BOLUS
1000.0000 mL | Freq: Once | INTRAVENOUS | Status: AC
  Administered 2019-03-15: 21:00:00 1000 mL via INTRAVENOUS

## 2019-03-15 MED ORDER — THIAMINE HCL 100 MG/ML IJ SOLN
100.0000 mg | Freq: Every day | INTRAMUSCULAR | Status: DC
  Administered 2019-03-16 – 2019-03-21 (×6): 100 mg via INTRAVENOUS
  Filled 2019-03-15 (×4): qty 2

## 2019-03-15 MED ORDER — SODIUM CHLORIDE 0.9 % IV BOLUS
1000.0000 mL | Freq: Once | INTRAVENOUS | Status: AC
  Administered 2019-03-15: 16:00:00 1000 mL via INTRAVENOUS

## 2019-03-15 MED ORDER — SODIUM CHLORIDE 0.9 % IV BOLUS
1000.0000 mL | Freq: Once | INTRAVENOUS | Status: AC
  Administered 2019-03-15: 1000 mL via INTRAVENOUS

## 2019-03-15 NOTE — ED Notes (Signed)
Unable to do CT scan at this time. Attempted to have patient lay on her back and she would not stay. Continues to stay in fetal position. RN aware.

## 2019-03-15 NOTE — ED Triage Notes (Signed)
Pt BIB EMS from hotel. EMS found half gallon bottle of Vodka at scene. Pt responsive to pain only. 80/60 BP upon arrival, CBG 78. Bolus of NS started. Pt has hx of alcohol and drug abuse.

## 2019-03-15 NOTE — ED Notes (Signed)
Date and time results received: 03/15/19 1635 (use smartphrase ".now" to insert current time)  Test: Lactic acid Critical Value: 2.8  Name of Provider Notified: Dr. Sherry Ruffing  Orders Received? Or Actions Taken?:

## 2019-03-15 NOTE — H&P (Signed)
Sheila DuraLeslie Redford ZOX:[id number] DOB: 02-15-1981 DOA: 03/15/2019     PCP: Patient, No Pcp Per   Outpatient Specialists:   NONE    Patient arrived to ER on 03/15/19 at 1533  Patient coming from: home    Chief Complaint:  Chief Complaint  Patient presents with   Hypotension    HPI: Sheila Graves is a 38 y.o. female with medical history significant of EtOH abuse, suicidal ideations, History of IV drug use in the past   Presented with from hotel unresponsive EMS found half a gallon of vodka at the scene initially hypotensive down to 80/60 CBG 78 given IV fluids Patient is unable to provide detailed history but currently states she was not trying to commit suicide does admit to drinking large a cup amounts of alcohol States she is currently has no suicidal ideations. Patient otherwise has no other complaints No recent fevers or chills no chest pain or shortness of breath no cough  Infectious risk factors:  Reports generalized pain   In  ER RAPID COVID TEST in house testing  Pending  Lab Results  Component Value Date   SARSCOV2NAA NEGATIVE 02/22/2019   SARSCOV2NAA NEGATIVE 02/04/2019   SARSCOV2NAA NEGATIVE 11/11/2018     Regarding pertinent Chronic problems:   Alcohol abuse and drug abuse with recurrent suicidal ideations    While in ER: Found to have AKI with creatinine up to 2.37 elevated white blood cell count 18.2 Alcohol level of 451 UTI The following Work up has been ordered so far:  Orders Placed This Encounter  Procedures   Urine culture   Blood culture (routine x 2)   CT Head Wo Contrast   DG Chest Portable 1 View   CBC with Differential   Comprehensive metabolic panel   Salicylate level   Acetaminophen level   Rapid urine drug screen (hospital performed)   Ethanol   Lipase, blood   Magnesium   Urinalysis, Routine w reflex microscopic   Pregnancy, urine   Cardiac monitoring   Consult to hospitalist  ALL PATIENTS BEING ADMITTED/HAVING  PROCEDURES NEED COVID-19 SCREENING   ED EKG   EKG 12-Lead    Following Medications were ordered in ER: Medications  sodium chloride 0.9 % bolus 1,000 mL (0 mLs Intravenous Stopped 03/15/19 1839)  sodium chloride 0.9 % bolus 1,000 mL (0 mLs Intravenous Stopped 03/15/19 1839)  sodium chloride 0.9 % bolus 1,000 mL (0 mLs Intravenous Stopped 03/15/19 2213)        Consult Orders  (From admission, onward)         Start     Ordered   03/15/19 2156  Consult to hospitalist  ALL PATIENTS BEING ADMITTED/HAVING PROCEDURES NEED COVID-19 SCREENING  Once    Comments: ALL PATIENTS BEING ADMITTED/HAVING PROCEDURES NEED COVID-19 SCREENING  Provider:  (Not yet assigned)  Question Answer Comment  Place call to: [facility]   Reason for Consult Admit      03/15/19 2155          Significant initial  Findings: Abnormal Labs Reviewed  CBC WITH DIFFERENTIAL/PLATELET - Abnormal; Notable for the following components:      Result Value   WBC 18.2 (*)    MCV 104.2 (*)    Neutro Abs 14.5 (*)    Monocytes Absolute 1.1 (*)    Abs Immature Granulocytes 0.16 (*)    All other components within normal limits  COMPREHENSIVE METABOLIC PANEL - Abnormal; Notable for the following components:   Chloride 95 (*)  CO2 11 (*)    BUN 39 (*)    Creatinine, Ser 2.37 (*)    Calcium 7.3 (*)    AST 193 (*)    ALT 57 (*)    GFR calc non Af Amer 26 (*)    GFR calc Af Amer 30 (*)    Anion gap 36 (*)    All other components within normal limits  ACETAMINOPHEN LEVEL - Abnormal; Notable for the following components:   Acetaminophen (Tylenol), Serum <10 (*)    All other components within normal limits  ETHANOL - Abnormal; Notable for the following components:   Alcohol, Ethyl (B) 451 (*)    All other components within normal limits  MAGNESIUM - Abnormal; Notable for the following components:   Magnesium 2.5 (*)    All other components within normal limits  URINALYSIS, ROUTINE W REFLEX MICROSCOPIC -  Abnormal; Notable for the following components:   APPearance CLOUDY (*)    Hgb urine dipstick LARGE (*)    Ketones, ur 20 (*)    Protein, ur 100 (*)    Bacteria, UA MANY (*)    All other components within normal limits     Otherwise labs showing:    Recent Labs  Lab 03/15/19 1600  NA 142  K 3.6  CO2 11*  GLUCOSE 82  BUN 39*  CREATININE 2.37*  CALCIUM 7.3*  MG 2.5*    Cr   Up from baseline see below Lab Results  Component Value Date   CREATININE 2.37 (H) 03/15/2019   CREATININE 0.80 02/22/2019   CREATININE 0.78 02/21/2019    Recent Labs  Lab 03/15/19 1600  AST 193*  ALT 57*  ALKPHOS 92  BILITOT 0.4  PROT 6.5  ALBUMIN 3.8   Lab Results  Component Value Date   CALCIUM 7.3 (L) 03/15/2019        WBC      Component Value Date/Time   WBC 18.2 (H) 03/15/2019 1600   ANC    Component Value Date/Time   NEUTROABS 14.5 (H) 03/15/2019 1600   ALC No components found for: LYMPHAB    Plt: Lab Results  Component Value Date   PLT 302 03/15/2019     Lactic Acid, Venous ordered No results found for: LATICACIDVEN    COVID-19 Labs  No results for input(s): DDIMER, FERRITIN, LDH, CRP in the last 72 hours.  Lab Results  Component Value Date   SARSCOV2NAA NEGATIVE 02/22/2019   SARSCOV2NAA NEGATIVE 02/04/2019   SARSCOV2NAA NEGATIVE 11/11/2018     Venous  Blood Gas result: Ordered     HG/HCT stable,       Component Value Date/Time   HGB 14.3 03/15/2019 1600   HCT 45.1 03/15/2019 1600    Recent Labs  Lab 03/15/19 1600  LIPASE 17     ECG: Ordered Personally reviewed by me showing: HR : 124 Rhythm: Sinus tachycardia    no evidence of ischemic changes QTC 543       UA  no evidence of UTI   large amount of hemoglobin    Urine analysis:    Component Value Date/Time   COLORURINE YELLOW 03/15/2019 1610   APPEARANCEUR CLOUDY (A) 03/15/2019 1610   LABSPEC 1.018 03/15/2019 1610   PHURINE 5.0 03/15/2019 1610   GLUCOSEU NEGATIVE 03/15/2019  1610   HGBUR LARGE (A) 03/15/2019 1610   BILIRUBINUR NEGATIVE 03/15/2019 1610   KETONESUR 20 (A) 03/15/2019 1610   PROTEINUR 100 (A) 03/15/2019 1610   NITRITE NEGATIVE 03/15/2019 1610  LEUKOCYTESUR NEGATIVE 03/15/2019 1610      Ordered  CT HEAD  NON acute  CXR -  NON acute     ED Triage Vitals [03/15/19 1540]  Enc Vitals Group     BP (!) 72/40     Pulse Rate (!) 123     Resp 18     Temp 97.7 F (36.5 C)     Temp Source Oral     SpO2 91 %     Weight      Height      Head Circumference      Peak Flow      Pain Score      Pain Loc      Pain Edu?      Excl. in GC?   ZOXW(96)@TMAX(24)@       Latest  Blood pressure (!) 85/51, pulse (!) 140, temperature 97.7 F (36.5 C), temperature source Oral, resp. rate (!) 23, last menstrual period 02/14/2019, SpO2 100 %.    Hospitalist was called for admission for dehydration   Review of Systems:    Pertinent positives include: unreponsive  Constitutional:  No weight loss, night sweats, Fevers, chills, fatigue, weight loss  HEENT:  No headaches, Difficulty swallowing,Tooth/dental problems,Sore throat,  No sneezing, itching, ear ache, nasal congestion, post nasal drip,  Cardio-vascular:  No chest pain, Orthopnea, PND, anasarca, dizziness, palpitations.no Bilateral lower extremity swelling  GI:  No heartburn, indigestion, abdominal pain, nausea, vomiting, diarrhea, change in bowel habits, loss of appetite, melena, blood in stool, hematemesis Resp:  no shortness of breath at rest. No dyspnea on exertion, No excess mucus, no productive cough, No non-productive cough, No coughing up of blood.No change in color of mucus.No wheezing. Skin:  no rash or lesions. No jaundice GU:  no dysuria, change in color of urine, no urgency or frequency. No straining to urinate.  No flank pain.  Musculoskeletal:  No joint pain or no joint swelling. No decreased range of motion. No back pain.  Psych:  No change in mood or affect. No depression or  anxiety. No memory loss.  Neuro: no localizing neurological complaints, no tingling, no weakness, no double vision, no gait abnormality, no slurred speech, no confusion  All systems reviewed and apart from HOPI all are negative  Past Medical History:   Past Medical History:  Diagnosis Date   Alcohol withdrawal seizure (HCC)    ETOH abuse    Hypokalemia    Pancreatitis    Thyroid disease       Past Surgical History:  Procedure Laterality Date   FINGER SURGERY     IRRIGATION AND DEBRIDEMENT SHOULDER Right 11/12/2018   Procedure: Irrigation And Debridement Shoulder;  Surgeon: Roby LoftsHaddix, Kevin P, MD;  Location: [facility] OR;  Service: Orthopedics;  Laterality: Right;   ORIF CLAVICULAR FRACTURE Right 11/12/2018   Procedure: OPEN REDUCTION INTERNAL FIXATION (ORIF) CLAVICULAR FRACTURE;  Surgeon: Roby LoftsHaddix, Kevin P, MD;  Location: [facility] OR;  Service: Orthopedics;  Laterality: Right;    Social History:  Ambulatory   independently      reports that she has been smoking. She has been smoking about 1.00 pack per day. She has never used smokeless tobacco. She reports current alcohol use. She reports previous drug use. Drug: Heroin.   Family History:   Family History  Problem Relation Age of Onset   Alcohol abuse Other     Allergies: No Known Allergies   Prior to Admission medications   Not on File   Physical Exam:  Blood pressure (!) 85/51, pulse (!) 140, temperature 97.7 F (36.5 C), temperature source Oral, resp. rate (!) 23, last menstrual period 02/14/2019, SpO2 100 %. 1. General:  in No  Acute distress    Chronically ill -appearing 2. Psychological: Alert and  Oriented 3. Head/ENT:    Dry Mucous Membranes                          Head Non traumatic, neck supple                        Poor Dentition 4. SKIN:   decreased Skin turgor,  Skin clean Dry and intact no rash 5. Heart: Regular rate and rhythm no  Murmur, no Rub or gallop 6. Lungs:  no wheezes or crackles   7. Abdomen:  Soft, generalized tenderness to palpation non distended  bowel sounds present 8. Lower extremities: no clubbing, cyanosis, no  edema 9. Neurologically Grossly intact, moving all 4 extremities equally  10. MSK: Normal range of motion Hold patient reports tenderness to palpation all over the body including abdomen chest arms legs she is screaming out in pain with every touch but otherwise appears to be resting comfortably  All other LABS:     Recent Labs  Lab 03/15/19 1600  WBC 18.2*  NEUTROABS 14.5*  HGB 14.3  HCT 45.1  MCV 104.2*  PLT 302     Recent Labs  Lab 03/15/19 1600  NA 142  K 3.6  CL 95*  CO2 11*  GLUCOSE 82  BUN 39*  CREATININE 2.37*  CALCIUM 7.3*  MG 2.5*     Recent Labs  Lab 03/15/19 1600  AST 193*  ALT 57*  ALKPHOS 92  BILITOT 0.4  PROT 6.5  ALBUMIN 3.8       Cultures: No results found for: SDES, SPECREQUEST, CULT, REPTSTATUS   Radiological Exams on Admission: Ct Head Wo Contrast  Result Date: 03/15/2019 CLINICAL DATA:  Altered mental status. Suspected ethanol intoxication EXAM: CT HEAD WITHOUT CONTRAST TECHNIQUE: Contiguous axial images were obtained from the base of the skull through the vertex without intravenous contrast. COMPARISON:  February 21, 2019 FINDINGS: Brain: The ventricles are normal in size and configuration. There is no intracranial mass, hemorrhage, extra-axial fluid collection, or midline shift. The brain parenchyma appears unremarkable. No acute infarct evident. Vascular: There is no hyperdense vessel. There is no evident vascular calcification. Skull: The bony calvarium appears intact. Sinuses/Orbits: There is mucosal thickening and opacification in multiple ethmoid air cells. Other paranasal sinuses are clear. Orbits appear symmetric bilaterally. Other: Mastoid air cells are clear. IMPRESSION: Areas of ethmoid sinus disease.  Study otherwise unremarkable. Electronically Signed   By: Lowella Grip III M.D.   On: 03/15/2019 21:45     Dg Chest Portable 1 View  Result Date: 03/15/2019 CLINICAL DATA:  Altered mental status.  Hypotension. EXAM: PORTABLE CHEST 1 VIEW COMPARISON:  February 03, 2019 FINDINGS: There is no edema or consolidation. Heart size and pulmonary vascularity are normal. No adenopathy. There is postop change in the right clavicle. IMPRESSION: No edema or consolidation. Electronically Signed   By: Lowella Grip III M.D.   On: 03/15/2019 16:32    Chart has been reviewed    Assessment/Plan  38 y.o. female with medical history significant of EtOH abuse, suicidal ideations, History of IV drug use in the past  Admitted for severe dehydration alcohol intoxication  Present on Admission:  Dehydration -  we will rehydrate and continue to follow check CK Evidence of metabolic acidosis we will check VBG most likely secondary to alcohol induced acidosis with increased gap as well as probably elevated lactic acid in the setting of severe dehydration and hypotension labs are currently pending Admit to stepdown   Alcohol intoxication (HCC) -patient has history of ongoing severe alcohol abuse will likely withdrawal.  Order CIWA protocol administer thiamine   Hypotension improved with IV fluid rehydration   Prolonged QT interval - - will monitor on tele avoid QT prolonging medications, rehydrate correct electrolytes  AKI -in setting of severe dehydration obtain urine electrolytes rehydrate and follow   Other plan as per orders.  DVT prophylaxis:  SCD    Code Status:  FULL CODE   Family Communication:   Family not at  Bedside    Disposition Plan:     To home once workup is complete and patient is stable                                        Social Work  consulted                                    Consults called: none   Admission status:  ED Disposition    ED Disposition Condition Comment   Admit  Hospital Area: [facility] [[id number]]  Level of Care: Stepdown [14]  Admit to  SDU based on following criteria: Hemodynamic compromise or significant risk of instability:  Patient requiring short term acute titration and management of vasoactive drips, and invasive monitoring (i.e., CVP and Arterial line).  Covid Evaluation: Asymptomatic Screening Protocol (No Symptoms)  Diagnosis: Dehydration [276.51.ICD-9-CM]  Admitting Physician: Therisa Doyne [[id number]]  Attending Physician: Therisa Doyne [[id number]]  PT Class (Do Not Modify): Observation [104]  PT Acc Code (Do Not Modify): Observation [10022]       Obs       Level of care         SDU tele indefinitely please discontinue once patient no longer qualifies  Precautions:   No active isolations  PPE: Used by the provider:   P100  eye Goggles,  Gloves    Reagan Klemz 03/16/2019, 2:15 AM    [facility]     after 2 AM please page floor coverage PA If 7AM-7PM, please contact the day team taking care of the patient using [email or web address]

## 2019-03-15 NOTE — ED Provider Notes (Signed)
[facility]-EMERGENCY DEPT Provider Note   CSN: [id number] Arrival date & time: 03/15/19  1533     History   Chief Complaint Chief Complaint  Patient presents with   Hypotension    HPI Sheila Graves is a 38 y.o. female.  lvl 5 caveat for AMS     The history is provided by medical records. The history is limited by the condition of the patient.  Altered Mental Status Presenting symptoms: partial responsiveness   Severity:  Severe Most recent episode:  Today Chronicity:  Recurrent Context: alcohol use     Past Medical History:  Diagnosis Date   Alcohol withdrawal seizure (HCC)    ETOH abuse    Hypokalemia    Pancreatitis    Thyroid disease     Patient Active Problem List   Diagnosis Date Noted   Alcohol intoxication (HCC) 02/04/2019   Hypokalemia 02/04/2019   Hypothermia 02/04/2019   Hypotension 02/04/2019   Fracture of clavicle, right, open 11/11/2018   Alcohol use disorder, severe, in controlled environment, dependence (HCC) 01/31/2018   Major depressive disorder, recurrent, severe without psychotic features (HCC) 01/31/2018    Past Surgical History:  Procedure Laterality Date   FINGER SURGERY     IRRIGATION AND DEBRIDEMENT SHOULDER Right 11/12/2018   Procedure: Irrigation And Debridement Shoulder;  Surgeon: Roby Lofts, MD;  Location: [facility] OR;  Service: Orthopedics;  Laterality: Right;   ORIF CLAVICULAR FRACTURE Right 11/12/2018   Procedure: OPEN REDUCTION INTERNAL FIXATION (ORIF) CLAVICULAR FRACTURE;  Surgeon: Roby Lofts, MD;  Location: [facility] OR;  Service: Orthopedics;  Laterality: Right;     OB History   No obstetric history on file.      Home Medications    Prior to Admission medications   Not on File    Family History No family history on file.  Social History Social History   Tobacco Use   Smoking status: Current Every Day Smoker    Packs/day: 1.00   Smokeless tobacco: Never Used  Substance  Use Topics   Alcohol use: Yes    Comment: fifth of liquor   Drug use: Not Currently    Types: Heroin     Allergies   Patient has no known allergies.   Review of Systems Review of Systems  Unable to perform ROS: Mental status change     Physical Exam Updated Vital Signs BP (!) 72/40 (BP Location: Left Arm)    Pulse (!) 123    Temp 97.7 F (36.5 C) (Oral)    Resp 18    LMP 02/14/2019    SpO2 91%   Physical Exam Vitals signs and nursing note reviewed.  Constitutional:      General: She is not in acute distress.    Appearance: She is normal weight. She is ill-appearing. She is not toxic-appearing.  HENT:     Head: Normocephalic.     Nose: No congestion or rhinorrhea.     Mouth/Throat:     Mouth: Mucous membranes are dry.     Pharynx: No oropharyngeal exudate or posterior oropharyngeal erythema.  Eyes:     Extraocular Movements: Extraocular movements intact.     Conjunctiva/sclera: Conjunctivae normal.     Pupils: Pupils are equal, round, and reactive to light.  Neck:     Musculoskeletal: No muscular tenderness.  Cardiovascular:     Rate and Rhythm: Regular rhythm. Tachycardia present.     Pulses: Normal pulses.     Heart sounds: No murmur.  Pulmonary:  Effort: Pulmonary effort is normal. No respiratory distress.     Breath sounds: No wheezing, rhonchi or rales.  Abdominal:     General: Abdomen is flat. There is no distension.     Tenderness: There is no abdominal tenderness.  Musculoskeletal:        General: No tenderness.  Skin:    Capillary Refill: Capillary refill takes less than 2 seconds.     Findings: No rash.  Neurological:     Mental Status: She is unresponsive.     GCS: GCS eye subscore is 2. GCS verbal subscore is 3. GCS motor subscore is 5.     Motor: No abnormal muscle tone.     Comments: Somnolent      ED Treatments / Results  Labs (all labs ordered are listed, but only abnormal results are displayed) Labs Reviewed  CBC WITH  DIFFERENTIAL/PLATELET - Abnormal; Notable for the following components:      Result Value   WBC 18.2 (*)    MCV 104.2 (*)    Neutro Abs 14.5 (*)    Monocytes Absolute 1.1 (*)    Abs Immature Granulocytes 0.16 (*)    All other components within normal limits  COMPREHENSIVE METABOLIC PANEL - Abnormal; Notable for the following components:   Chloride 95 (*)    CO2 11 (*)    BUN 39 (*)    Creatinine, Ser 2.37 (*)    Calcium 7.3 (*)    AST 193 (*)    ALT 57 (*)    GFR calc non Af Amer 26 (*)    GFR calc Af Amer 30 (*)    Anion gap 36 (*)    All other components within normal limits  ACETAMINOPHEN LEVEL - Abnormal; Notable for the following components:   Acetaminophen (Tylenol), Serum <10 (*)    All other components within normal limits  ETHANOL - Abnormal; Notable for the following components:   Alcohol, Ethyl (B) 451 (*)    All other components within normal limits  MAGNESIUM - Abnormal; Notable for the following components:   Magnesium 2.5 (*)    All other components within normal limits  URINALYSIS, ROUTINE W REFLEX MICROSCOPIC - Abnormal; Notable for the following components:   APPearance CLOUDY (*)    Hgb urine dipstick LARGE (*)    Ketones, ur 20 (*)    Protein, ur 100 (*)    Bacteria, UA MANY (*)    All other components within normal limits  URINE CULTURE  CULTURE, BLOOD (ROUTINE X 2)  CULTURE, BLOOD (ROUTINE X 2)  SALICYLATE LEVEL  RAPID URINE DRUG SCREEN, HOSP PERFORMED  LIPASE, BLOOD  PREGNANCY, URINE  TROPONIN I (HIGH SENSITIVITY)  TROPONIN I (HIGH SENSITIVITY)    EKG EKG Interpretation  Date/Time:  Wednesday March 15 2019 16:09:17 EDT Ventricular Rate:  124 PR Interval:    QRS Duration: 79 QT Interval:  378 QTC Calculation: 543 R Axis:   91 Text Interpretation:  Sinus tachycardia Borderline right axis deviation Borderline repol abnrm, anterolateral leads Prolonged QT interval when compared to prior,  faster rate and longer QTc.  No STEMI Confirmed  by Antony Blackbird [phone]) on 03/15/2019 4:11:57 PM   Radiology Ct Head Wo Contrast  Result Date: 03/15/2019 CLINICAL DATA:  Altered mental status. Suspected ethanol intoxication EXAM: CT HEAD WITHOUT CONTRAST TECHNIQUE: Contiguous axial images were obtained from the base of the skull through the vertex without intravenous contrast. COMPARISON:  February 21, 2019 FINDINGS: Brain: The ventricles are normal in  size and configuration. There is no intracranial mass, hemorrhage, extra-axial fluid collection, or midline shift. The brain parenchyma appears unremarkable. No acute infarct evident. Vascular: There is no hyperdense vessel. There is no evident vascular calcification. Skull: The bony calvarium appears intact. Sinuses/Orbits: There is mucosal thickening and opacification in multiple ethmoid air cells. Other paranasal sinuses are clear. Orbits appear symmetric bilaterally. Other: Mastoid air cells are clear. IMPRESSION: Areas of ethmoid sinus disease.  Study otherwise unremarkable. Electronically Signed   By: Bretta BangWilliam  Woodruff III M.D.   On: 03/15/2019 21:45   Dg Chest Portable 1 View  Result Date: 03/15/2019 CLINICAL DATA:  Altered mental status.  Hypotension. EXAM: PORTABLE CHEST 1 VIEW COMPARISON:  February 03, 2019 FINDINGS: There is no edema or consolidation. Heart size and pulmonary vascularity are normal. No adenopathy. There is postop change in the right clavicle. IMPRESSION: No edema or consolidation. Electronically Signed   By: Bretta BangWilliam  Woodruff III M.D.   On: 03/15/2019 16:32    Procedures Procedures (including critical care time)  CRITICAL CARE Performed by: Canary Brimhristopher J Brnadon Eoff Total critical care time: 35 minutes Critical care time was exclusive of separately billable procedures and treating other patients. Critical care was necessary to treat or prevent imminent or life-threatening deterioration. Critical care was time spent personally by me on the following activities: development of  treatment plan with patient and/or surrogate as well as nursing, discussions with consultants, evaluation of patient's response to treatment, examination of patient, obtaining history from patient or surrogate, ordering and performing treatments and interventions, ordering and review of laboratory studies, ordering and review of radiographic studies, pulse oximetry and re-evaluation of patient's condition.   Medications Ordered in ED Medications  sodium chloride 0.9 % bolus 1,000 mL (0 mLs Intravenous Stopped 03/15/19 1839)  sodium chloride 0.9 % bolus 1,000 mL (0 mLs Intravenous Stopped 03/15/19 1839)  sodium chloride 0.9 % bolus 1,000 mL (0 mLs Intravenous Stopped 03/15/19 2213)     Initial Impression / Assessment and Plan / ED Course  I have reviewed the triage vital signs and the nursing notes.  Pertinent labs & imaging results that were available during my care of the patient were reviewed by me and considered in my medical decision making (see chart for details).        Sheila Graves is a 38 y.o. female with a past medical history significant for thyroid disease, pancreatitis, prior alcohol abuse with alcohol withdrawal seizure, and recent visit for hypotension with altered mental status in the setting of intoxication who presents with hypotension, altered mental status, and likely intoxication.  Patient is brought in by EMS after she was found in a hotel room with half empty bottles of vodka around her with somnolence and decreased mental status.  Patient is responsive to pain during transport.  Patient was hypotensive with blood pressure in the 80s with EMS and in the 70s on arrival.  Glucose normal at 78.  Patient was seen for very similar presentation several weeks ago with intoxication, hypoxia, and hypotension.  After patient was able to metabolize the alcohol, patient denied any suicidal ideation or threats and was able to follow-up as an outpatient for alcohol abuse and substance  abuse.  Patient will not answer any questions but does not have any evidence of external trauma.  Patient was found in the bed and not on the floor.  Doubt injury however, given the intoxication, cannot rule out head injury prior to laying on the bed.  On exam, patient's lungs  are clear and chest and abdomen are nontender.  Patient moving all extremities to painful stimuli.  Pupils are not pinpoint and are 4 mm and reactive bilaterally.  Patient moving her eyes in all directions with head movement.  Patient was slightly hypoxic with sats in the mid 80s and she was placed on nasal cannula oxygen segmentation improving the oxygen saturations.  Clinically I suspect this is a similar presentation to prior with alcohol intoxication causing the altered mental status, tachycardia, and hypotension.  She is afebrile.  Lungs were clear however will get chest x-ray and will get urinalysis to look for infection.  Low suspicion for infection at this time.  Will hold on broad-spectrum antibiotics initially unless a source is clearly discovered.  We will give fluids and will check further Co. ingestions.  She did moan the word no when I asked if this was a suicide attempt during painful stimulation.  Anticipate reassessment after work-up and metabolism of likely alcohol intoxication.  Patient's alcohol returned at 451.  Urinalysis does not show nitrites or leukocytes, doubt UTI.  UDS negative.  CBC shows mild leukocytosis with no anemia.  Metabolic panel shows acute kidney injury with creatinine of 2.37 up from normal previously 3 weeks ago.  Patient also has elevation in her AST and ALT.  Anion gap is 36 likely related to the alcohol.  No evidence of DKA at this time.  Salicylate and Tylenol negative.  Lipase normal.  Chest x-ray shows no pneumonia or injury.  Patient has received 2 L of fluid.  Blood pressure still around 100 systolic.  She is still tachycardic.  We will give more fluids.  She is awaiting head CT  prior to admission for AKI and somnolence likely due to alcohol intoxication.  Low suspicion for infection at this time.     9:18 PM Patient's mental status has improved on reassessment.  She is now answering some questions and reports he is having some headache.  She is still intoxicated and disoriented and somnolent but is arousable to voice now instead of just pain.  Patient moving all extremities.  Patient informed that she has acute kidney injury and will need admission for rehydration and monitoring.  She denied SI or HI and denied overdose attempt.  She denies other medication use.  She is now getting her third liter fluids.  She is still tachycardic in the 130s but her blood pressure has improved over 100 systolic.  10:18 PM Head CT shows no intracranial injury or bleed.  Due to her to continue tachycardia, soft blood pressures, and intoxication with AKI, she will be admitted.   Final Clinical Impressions(s) / ED Diagnoses   Final diagnoses:  AKI (acute kidney injury) (HCC)  Dehydration  Somnolence  Alcoholic intoxication with complication (HCC)    Clinical Impression: 1. AKI (acute kidney injury) (HCC)   2. Dehydration   3. Somnolence   4. Alcoholic intoxication with complication (HCC)     Disposition: Admit  This note was prepared with assistance of Dragon voice recognition software. Occasional wrong-word or sound-a-like substitutions may have occurred due to the inherent limitations of voice recognition software.      Daya Dutt, Canary Brimhristopher J, MD 03/15/19 [phone]

## 2019-03-16 ENCOUNTER — Observation Stay (HOSPITAL_COMMUNITY): Payer: Self-pay

## 2019-03-16 ENCOUNTER — Encounter (HOSPITAL_COMMUNITY): Payer: Self-pay | Admitting: Internal Medicine

## 2019-03-16 ENCOUNTER — Inpatient Hospital Stay: Payer: Self-pay

## 2019-03-16 DIAGNOSIS — R9431 Abnormal electrocardiogram [ECG] [EKG]: Secondary | ICD-10-CM | POA: Diagnosis present

## 2019-03-16 DIAGNOSIS — R6521 Severe sepsis with septic shock: Secondary | ICD-10-CM

## 2019-03-16 DIAGNOSIS — N179 Acute kidney failure, unspecified: Secondary | ICD-10-CM

## 2019-03-16 DIAGNOSIS — E872 Acidosis, unspecified: Secondary | ICD-10-CM | POA: Diagnosis present

## 2019-03-16 DIAGNOSIS — K7010 Alcoholic hepatitis without ascites: Secondary | ICD-10-CM

## 2019-03-16 DIAGNOSIS — E860 Dehydration: Secondary | ICD-10-CM

## 2019-03-16 DIAGNOSIS — F10929 Alcohol use, unspecified with intoxication, unspecified: Secondary | ICD-10-CM

## 2019-03-16 DIAGNOSIS — R7401 Elevation of levels of liver transaminase levels: Secondary | ICD-10-CM | POA: Diagnosis present

## 2019-03-16 DIAGNOSIS — A419 Sepsis, unspecified organism: Principal | ICD-10-CM

## 2019-03-16 DIAGNOSIS — N170 Acute kidney failure with tubular necrosis: Secondary | ICD-10-CM

## 2019-03-16 DIAGNOSIS — M6282 Rhabdomyolysis: Secondary | ICD-10-CM

## 2019-03-16 DIAGNOSIS — R740 Nonspecific elevation of levels of transaminase and lactic acid dehydrogenase [LDH]: Secondary | ICD-10-CM

## 2019-03-16 LAB — BLOOD GAS, VENOUS
Acid-base deficit: 21.4 mmol/L — ABNORMAL HIGH (ref 0.0–2.0)
Bicarbonate: 9 mmol/L — ABNORMAL LOW (ref 20.0–28.0)
O2 Saturation: 60.6 %
Patient temperature: 97.7
pCO2, Ven: 36.1 mmHg — ABNORMAL LOW (ref 44.0–60.0)
pH, Ven: 7.022 — CL (ref 7.250–7.430)
pO2, Ven: 43.4 mmHg (ref 32.0–45.0)

## 2019-03-16 LAB — ETHANOL: Alcohol, Ethyl (B): <10 mg/dL (ref <10)

## 2019-03-16 LAB — CBC
HCT: 35 % — ABNORMAL LOW (ref 36.0–46.0)
Hemoglobin: 11.3 g/dL — ABNORMAL LOW (ref 12.0–15.0)
MCH: 33.4 pg (ref 26.0–34.0)
MCHC: 32.3 g/dL (ref 30.0–36.0)
MCV: 103.6 fL — ABNORMAL HIGH (ref 80.0–100.0)
Platelets: 172 10*3/uL (ref 150–400)
RBC: 3.38 MIL/uL — ABNORMAL LOW (ref 3.87–5.11)
RDW: 14.1 % (ref 11.5–15.5)
WBC: 13.2 10*3/uL — ABNORMAL HIGH (ref 4.0–10.5)
nRBC: 0.2 % (ref 0.0–0.2)

## 2019-03-16 LAB — SARS CORONAVIRUS 2 (TAT 6-24 HRS): SARS Coronavirus 2: NEGATIVE

## 2019-03-16 LAB — COMPREHENSIVE METABOLIC PANEL
ALT: 68 U/L — ABNORMAL HIGH (ref 0–44)
AST: 249 U/L — ABNORMAL HIGH (ref 15–41)
Albumin: 3.4 g/dL — ABNORMAL LOW (ref 3.5–5.0)
Alkaline Phosphatase: 83 U/L (ref 38–126)
Anion gap: 33 — ABNORMAL HIGH (ref 5–15)
BUN: 43 mg/dL — ABNORMAL HIGH (ref 6–20)
CO2: 7 mmol/L — ABNORMAL LOW (ref 22–32)
Calcium: 6.8 mg/dL — ABNORMAL LOW (ref 8.9–10.3)
Chloride: 109 mmol/L (ref 98–111)
Creatinine, Ser: 1.88 mg/dL — ABNORMAL HIGH (ref 0.44–1.00)
GFR calc Af Amer: 39 mL/min — ABNORMAL LOW (ref >60)
GFR calc non Af Amer: 34 mL/min — ABNORMAL LOW (ref >60)
Glucose, Bld: 89 mg/dL (ref 70–99)
Potassium: 4.4 mmol/L (ref 3.5–5.1)
Sodium: 149 mmol/L — ABNORMAL HIGH (ref 135–145)
Total Bilirubin: 0.5 mg/dL (ref 0.3–1.2)
Total Protein: 5.6 g/dL — ABNORMAL LOW (ref 6.5–8.1)

## 2019-03-16 LAB — BLOOD GAS, ARTERIAL
Acid-base deficit: 17.4 mmol/L — ABNORMAL HIGH (ref 0.0–2.0)
Bicarbonate: 8.8 mmol/L — ABNORMAL LOW (ref 20.0–28.0)
Drawn by: 232811
O2 Saturation: 97.5 %
Patient temperature: 98.5
pCO2 arterial: 22.1 mmHg — ABNORMAL LOW (ref 32.0–48.0)
pH, Arterial: 7.223 — ABNORMAL LOW (ref 7.350–7.450)
pO2, Arterial: 113 mmHg — ABNORMAL HIGH (ref 83.0–108.0)

## 2019-03-16 LAB — CK
Total CK: 11587 U/L — ABNORMAL HIGH (ref 38–234)
Total CK: 11827 U/L — ABNORMAL HIGH (ref 38–234)

## 2019-03-16 LAB — BASIC METABOLIC PANEL
Anion gap: 25 — ABNORMAL HIGH (ref 5–15)
BUN: 40 mg/dL — ABNORMAL HIGH (ref 6–20)
CO2: 15 mmol/L — ABNORMAL LOW (ref 22–32)
Calcium: 6.4 mg/dL — CL (ref 8.9–10.3)
Chloride: 106 mmol/L (ref 98–111)
Creatinine, Ser: 1.64 mg/dL — ABNORMAL HIGH (ref 0.44–1.00)
GFR calc Af Amer: 46 mL/min — ABNORMAL LOW (ref >60)
GFR calc non Af Amer: 40 mL/min — ABNORMAL LOW (ref >60)
Glucose, Bld: 156 mg/dL — ABNORMAL HIGH (ref 70–99)
Potassium: 3.4 mmol/L — ABNORMAL LOW (ref 3.5–5.1)
Sodium: 146 mmol/L — ABNORMAL HIGH (ref 135–145)

## 2019-03-16 LAB — VOLATILES,BLD-ACETONE,ETHANOL,ISOPROP,METHANOL
Acetone, blood: 0.02 % — ABNORMAL HIGH (ref 0.000–0.010)
Ethanol, blood: NEGATIVE % (ref 0.000–0.010)
Isopropanol, blood: NEGATIVE % (ref 0.000–0.010)
Methanol, blood: NEGATIVE % (ref 0.000–0.010)

## 2019-03-16 LAB — LACTIC ACID, PLASMA
Lactic Acid, Venous: 10.8 mmol/L (ref 0.5–1.9)
Lactic Acid, Venous: 6.9 mmol/L (ref 0.5–1.9)
Lactic Acid, Venous: 4.1 mmol/L (ref 0.5–1.9)

## 2019-03-16 LAB — TSH: TSH: 1.272 u[IU]/mL (ref 0.350–4.500)

## 2019-03-16 LAB — ETHYLENE GLYCOL: Ethylene Glycol Lvl: <5 mg/dL

## 2019-03-16 LAB — MAGNESIUM: Magnesium: 2.1 mg/dL (ref 1.7–2.4)

## 2019-03-16 LAB — TROPONIN I (HIGH SENSITIVITY): Troponin I (High Sensitivity): 40 ng/L — ABNORMAL HIGH (ref <18)

## 2019-03-16 LAB — URINE CULTURE: Culture: NO GROWTH

## 2019-03-16 LAB — BETA-HYDROXYBUTYRIC ACID: Beta-Hydroxybutyric Acid: 7.31 mmol/L — ABNORMAL HIGH (ref 0.05–0.27)

## 2019-03-16 LAB — PHOSPHORUS
Phosphorus: 8.5 mg/dL — ABNORMAL HIGH (ref 2.5–4.6)
Phosphorus: 7.3 mg/dL — ABNORMAL HIGH (ref 2.5–4.6)

## 2019-03-16 LAB — SODIUM, URINE, RANDOM: Sodium, Ur: 106 mmol/L

## 2019-03-16 LAB — OSMOLALITY, URINE: Osmolality, Ur: 599 mOsm/kg (ref 300–900)

## 2019-03-16 LAB — OSMOLALITY: Osmolality: 335 mOsm/kg (ref 275–295)

## 2019-03-16 LAB — PROTIME-INR
INR: 1 (ref 0.8–1.2)
Prothrombin Time: 13.3 seconds (ref 11.4–15.2)

## 2019-03-16 MED ORDER — ACETAMINOPHEN 650 MG RE SUPP: 650.0000 mg | Freq: Four times a day (QID) | RECTAL | Status: DC | PRN

## 2019-03-16 MED ORDER — VITAMIN B-1 100 MG PO TABS
100.0000 mg | ORAL_TABLET | Freq: Every day | ORAL | Status: DC
  Administered 2019-03-20 – 2019-03-21 (×2): 100 mg via ORAL
  Filled 2019-03-16 (×2): qty 1

## 2019-03-16 MED ORDER — SODIUM CHLORIDE 0.9 % IV SOLN
INTRAVENOUS | Status: DC | PRN
  Administered 2019-03-16: 500 mL via INTRAVENOUS

## 2019-03-16 MED ORDER — LORAZEPAM 2 MG/ML IJ SOLN
1.0000 mg | INTRAMUSCULAR | Status: AC | PRN
  Administered 2019-03-16: 1 mg via INTRAVENOUS
  Administered 2019-03-16 – 2019-03-18 (×14): 2 mg via INTRAVENOUS
  Filled 2019-03-16 (×16): qty 1

## 2019-03-16 MED ORDER — FOLIC ACID 1 MG PO TABS: 1.0000 mg | ORAL_TABLET | Freq: Every day | ORAL | Status: DC

## 2019-03-16 MED ORDER — ADULT MULTIVITAMIN W/MINERALS CH
1.0000 | ORAL_TABLET | Freq: Every day | ORAL | Status: DC
  Administered 2019-03-19 – 2019-03-21 (×3): 1 via ORAL
  Filled 2019-03-16 (×3): qty 1

## 2019-03-16 MED ORDER — POTASSIUM CHLORIDE 20 MEQ/15ML (10%) PO SOLN: 40.0000 meq | Freq: Once | ORAL | Status: DC

## 2019-03-16 MED ORDER — CHLORHEXIDINE GLUCONATE CLOTH 2 % EX PADS
6.0000 | MEDICATED_PAD | Freq: Every day | CUTANEOUS | Status: DC
  Administered 2019-03-16 – 2019-03-21 (×6): 6 via TOPICAL

## 2019-03-16 MED ORDER — THIAMINE HCL 100 MG/ML IJ SOLN
100.0000 mg | Freq: Every day | INTRAMUSCULAR | Status: DC
  Administered 2019-03-18: 09:00:00 100 mg via INTRAVENOUS
  Filled 2019-03-16 (×2): qty 2

## 2019-03-16 MED ORDER — SODIUM CHLORIDE 0.45 % IV SOLN
INTRAVENOUS | Status: DC
  Administered 2019-03-16 – 2019-03-17 (×3): via INTRAVENOUS
  Administered 2019-03-18: 100 mL/h via INTRAVENOUS
  Administered 2019-03-19 – 2019-03-20 (×3): via INTRAVENOUS

## 2019-03-16 MED ORDER — ONDANSETRON HCL 4 MG/2ML IJ SOLN
INTRAMUSCULAR | Status: AC
  Filled 2019-03-16: qty 2

## 2019-03-16 MED ORDER — SODIUM CHLORIDE 0.9 % IV BOLUS
2000.0000 mL | Freq: Once | INTRAVENOUS | Status: AC
  Administered 2019-03-16: 2000 mL via INTRAVENOUS

## 2019-03-16 MED ORDER — LORAZEPAM 1 MG PO TABS
1.0000 mg | ORAL_TABLET | ORAL | Status: AC | PRN
  Administered 2019-03-16: 2 mg via ORAL
  Filled 2019-03-16: qty 2

## 2019-03-16 MED ORDER — PANTOPRAZOLE SODIUM 40 MG IV SOLR
40.0000 mg | Freq: Two times a day (BID) | INTRAVENOUS | Status: DC
  Administered 2019-03-16 – 2019-03-21 (×11): 40 mg via INTRAVENOUS
  Filled 2019-03-16 (×11): qty 40

## 2019-03-16 MED ORDER — ACETAMINOPHEN 325 MG PO TABS: 650.0000 mg | ORAL_TABLET | Freq: Four times a day (QID) | ORAL | Status: DC | PRN

## 2019-03-16 MED ORDER — CALCIUM GLUCONATE-NACL 1-0.675 GM/50ML-% IV SOLN
1.0000 g | Freq: Once | INTRAVENOUS | Status: AC
  Administered 2019-03-16: 1000 mg via INTRAVENOUS
  Filled 2019-03-16: qty 50

## 2019-03-16 MED ORDER — ORAL CARE MOUTH RINSE: 15.0000 mL | Freq: Two times a day (BID) | OROMUCOSAL | Status: DC

## 2019-03-16 MED ORDER — CHLORHEXIDINE GLUCONATE 0.12 % MT SOLN
15.0000 mL | Freq: Two times a day (BID) | OROMUCOSAL | Status: DC
  Administered 2019-03-16 – 2019-03-18 (×5): 15 mL via OROMUCOSAL
  Filled 2019-03-16 (×5): qty 15

## 2019-03-16 MED ORDER — DEXMEDETOMIDINE HCL IN NACL 200 MCG/50ML IV SOLN: 0.2000 ug/kg/h | INTRAVENOUS | Status: DC

## 2019-03-16 MED ORDER — SODIUM CHLORIDE 0.9 % IV BOLUS
1000.0000 mL | Freq: Once | INTRAVENOUS | Status: AC
  Administered 2019-03-16: 1000 mL via INTRAVENOUS

## 2019-03-16 MED ORDER — ORAL CARE MOUTH RINSE
15.0000 mL | Freq: Two times a day (BID) | OROMUCOSAL | Status: DC
  Administered 2019-03-17 – 2019-03-18 (×3): 15 mL via OROMUCOSAL

## 2019-03-16 MED ORDER — SODIUM CHLORIDE 0.9 % IV SOLN
1.0000 g | Freq: Every day | INTRAVENOUS | Status: DC
  Administered 2019-03-16 – 2019-03-18 (×3): 1 g via INTRAVENOUS
  Filled 2019-03-16 (×3): qty 1

## 2019-03-16 MED ORDER — SODIUM CHLORIDE 0.9% FLUSH
10.0000 mL | Freq: Two times a day (BID) | INTRAVENOUS | Status: DC
  Administered 2019-03-16 – 2019-03-19 (×6): 10 mL
  Administered 2019-03-20: 20 mL

## 2019-03-16 MED ORDER — POTASSIUM CHLORIDE 10 MEQ/100ML IV SOLN
10.0000 meq | INTRAVENOUS | Status: AC
  Administered 2019-03-16 (×2): 10 meq via INTRAVENOUS
  Filled 2019-03-16 (×2): qty 100

## 2019-03-16 MED ORDER — STERILE WATER FOR INJECTION IV SOLN
INTRAVENOUS | Status: DC
  Administered 2019-03-16: 05:00:00 via INTRAVENOUS
  Filled 2019-03-16 (×2): qty 850

## 2019-03-16 MED ORDER — CHLORDIAZEPOXIDE HCL 25 MG PO CAPS
25.0000 mg | ORAL_CAPSULE | Freq: Four times a day (QID) | ORAL | Status: DC
  Administered 2019-03-16 – 2019-03-20 (×9): 25 mg via ORAL
  Filled 2019-03-16 (×9): qty 1

## 2019-03-16 MED ORDER — SODIUM BICARBONATE 8.4 % IV SOLN
INTRAVENOUS | Status: DC
  Administered 2019-03-16 – 2019-03-18 (×4): via INTRAVENOUS
  Filled 2019-03-16 (×4): qty 75

## 2019-03-16 MED ORDER — CHLORDIAZEPOXIDE HCL 25 MG PO CAPS
50.0000 mg | ORAL_CAPSULE | Freq: Once | ORAL | Status: AC
  Administered 2019-03-16: 50 mg via ORAL
  Filled 2019-03-16: qty 2

## 2019-03-16 MED ORDER — SODIUM CHLORIDE 0.9 % IV SOLN
INTRAVENOUS | Status: DC
  Administered 2019-03-16: 05:00:00 via INTRAVENOUS

## 2019-03-16 MED ORDER — POTASSIUM CHLORIDE 10 MEQ/100ML IV SOLN
10.0000 meq | INTRAVENOUS | Status: AC
  Administered 2019-03-16 (×2): 10 meq via INTRAVENOUS
  Filled 2019-03-16 (×2): qty 100

## 2019-03-16 MED ORDER — HEPARIN SODIUM (PORCINE) 5000 UNIT/ML IJ SOLN
5000.0000 [IU] | Freq: Three times a day (TID) | INTRAMUSCULAR | Status: DC
  Administered 2019-03-16 – 2019-03-17 (×3): 5000 [IU] via SUBCUTANEOUS
  Filled 2019-03-16 (×3): qty 1

## 2019-03-16 MED ORDER — ONDANSETRON HCL 4 MG/2ML IJ SOLN
4.0000 mg | Freq: Four times a day (QID) | INTRAMUSCULAR | Status: DC | PRN
  Administered 2019-03-16: 4 mg via INTRAVENOUS

## 2019-03-16 MED ORDER — SODIUM CHLORIDE 0.9% FLUSH: 10.0000 mL | INTRAVENOUS | Status: DC | PRN

## 2019-03-16 NOTE — Progress Notes (Signed)
Peripherally Inserted Central Catheter/Midline Placement  The IV Nurse has discussed with the patient and/or persons authorized to consent for the patient, the purpose of this procedure and the potential benefits and risks involved with this procedure.  The benefits include less needle sticks, lab draws from the catheter, and the patient may be discharged home with the catheter. Risks include, but not limited to, infection, bleeding, blood clot (thrombus formation), and puncture of an artery; nerve damage and irregular heartbeat and possibility to perform a PICC exchange if needed/ordered by physician.  Alternatives to this procedure were also discussed.  B[employer] Power PICC patient education guide, fact sheet on infection prevention and patient information card has been provided to patient /or left at bedside.    PICC/Midline Placement Documentation  PICC Double Lumen 03/16/19 PICC Right Brachial 38 cm 0 cm (Active)  Indication for Insertion or Continuance of Line Vasoactive infusions 03/16/19 1700  Exposed Catheter (cm) 0 cm 03/16/19 1700  Site Assessment Clean;Dry;Intact 03/16/19 1700  Lumen #1 Status Flushed;Saline locked;Blood return noted 03/16/19 1700  Lumen #2 Status Flushed;Saline locked;Blood return noted 03/16/19 1700  Dressing Type Transparent;Securing device 03/16/19 1700  Dressing Status Clean;Dry;Intact;Antimicrobial disc in place 03/16/19 [place] checked and tightened 03/16/19 1700  Dressing Intervention New dressing;Other (Comment) 03/16/19 1700  Dressing Change Due 03/23/19 03/16/19 1700    Patient wife gave consent for procedure   Virgilio Belling 03/16/2019, 6:02 PM

## 2019-03-16 NOTE — ED Notes (Signed)
Hospitalist paged for critical values.

## 2019-03-16 NOTE — ED Notes (Signed)
Attempted to call report, no answer at this time.

## 2019-03-16 NOTE — Progress Notes (Signed)
Spoke with patient's wife at bedside. Reports patient has ongoing issue with heavy drinking and has multiple ED visits in past months with etoh overdose. She has expressed being suicidal and threatening the wife that she would sometimes slit her wrist or jump from the stairs. Wife feels threatened to live with her kids with the patient. Patient threatens to be suicidal to her but once she's asked by EMS or hospital personnel in the ED she denies and gets discharged home.  Patient has hx of major depressive disorder with psychotic behavior and was admitted to [facility] last in 2019. Will obtain psych consult. Patient is currently encephalitic and unable to make safe complex medical decision. She currently doesn't have capacity to leave AMA even if she expresses to.

## 2019-03-16 NOTE — ED Notes (Signed)
Pt repositioned in bed and given warm blankets.

## 2019-03-16 NOTE — Progress Notes (Addendum)
PROGRESS NOTE                                                                                                                                                                                                             Patient Demographics:    Sheila Graves, is a 38 y.o. female, DOB - Jun 29, 1981, ZOX:[id number]  Admit date - 03/15/2019   Admitting Physician Therisa DoyneAnastassia Doutova, MD  Outpatient Primary MD for the patient is Patient, No Pcp Per  LOS - 0  Outpatient Specialists: none  Chief Complaint  Patient presents with   Hypotension       Brief Narrative   38 year old female with alcohol abuse and history of suicidal ideations, prior history of IV drug use was found in a hotel unresponsive.  EMS found half a gallon of vodka at the scene.  She was hypotensive with blood pressure 80/60 mmHg and CBG of 78.  1 month back she was brought to the ED by EMS after found unresponsive with significant alcohol level and early withdrawal but signed out AMA the same night. Patient is confused and provides limited history.  Says she only drank vodka and no methanol, antifreeze or any rubbing alcohol or mouthwash.  Patient found to be acute severe metabolic acidosis with anion gap in the 30s, severe lactic acidosis in the 10s, AKI, transaminitis and CPK of 11,000.  Alcohol level of 450s. Patient required admission to stepdown unit for close monitoring  Subjective:   Patient seen and examined this morning.  Blood pressure is soft with systolic in the 90s, tachycardic in the 120s.  Is confused.  Was restrained as she was trying to get out of bed.  Did not remember where she was and why she was in the hospital.  Complains of pain in her upper abdomen   Assessment  & Plan :    Active Problems: Sepsis, noninfectious with multiorgan failure Suspect secondary to alcoholic hepatitis, ? ingestion of other agents (ethylene glycol, isopropyl  alcohol).  Patient hypotensive, tachycardic with significant lactic acid, metabolic acidosis.  ATN and transaminitis. Continue stepdown monitoring. Persistent worsened metabolic acidosis, bicarb of 7 this morning and anion gap of 33.  Alcohol level of 450s, significant osmolar gap (400-450). Labs ordered for ethylene glycol, methanol and aspirin while alcohol. On 3 Amps of bicarb.  Monitor lites every 6-8  hours.  Foley placed in for closed urine output monitoring. Nephrology and PCCM consulted.  High risk of requiring CRRT.  Acute symptoms Acute rhabdomyolysis CPK 11,000. IV hydration and UOP monitoring.  Urine drug screen negative for cocaine, benzos or opiates.  Acute tubular necrosis Prerenal (Fena 0.3)+/- RTA.  Check urine osmolality.  Avoid nephrotoxins.  Monitor with IV fluids. Check renal ultrasound.  Alcohol intoxication with withdrawal Has transaminitis with alcohol level in the 450s.  Tachycardic and hypertensive with confusion and restlessness. Being monitored on CIWA.  Abdominal ultrasound shows no definite liver or GB pathology. Suggests a distended abdomen. Had a coffee ground emesis this morning. Will avoid NG placement for now.  transaminitis Secondary to alcoholic hepatitis. Discriminant score is low and does not need steroids at present.    ?UTI  on empiric rocephin. Follow cx  Patient status : inpatient  patient has multiorgan failure ( liver, kidneys) with septic shock of non infectious etiology, acute encephalopathy and rhabdomyolysis. patient has critical illness requiring close monitored in the ICU setting. She is at high risk for further deterioration of her symptoms with requirement of pressors, mechanical ventilation and CRRT. patient needs to be monitored din an inpatient setting for at least >2 midnights  Code Status : full code  Family Communication  : none  Disposition Plan  : pending  Barriers For Discharge : active symptoms  Consults  :  PCCM/  renal  Procedures  : [place] abd  DVT Prophylaxis  :  Lovenox -  Lab Results  Component Value Date   PLT 172 03/16/2019    Antibiotics  :    Anti-infectives (From admission, onward)   Start     Dose/Rate Route Frequency Ordered Stop   03/16/19 0415  cefTRIAXone (ROCEPHIN) 1 g in sodium chloride 0.9 % 100 mL IVPB     1 g 200 mL/hr over 30 Minutes Intravenous Daily 03/16/19 0403          Objective:   Vitals:   03/16/19 0500 03/16/19 0600 03/16/19 0700 03/16/19 0800  BP: (!) 88/40 (!) 71/37 98/62 (!) 92/58  Pulse: (!) 136 (!) 131 (!) 137 (!) 131  Resp: 16 (!) 21 16   Temp:   98.3 F (36.8 C)   TempSrc:   Oral   SpO2: 100% 100% 100%     Wt Readings from Last 3 Encounters:  01/18/19 (S) 61 kg  09/14/18 61.2 kg  08/25/18 72.6 kg     Intake/Output Summary (Last 24 hours) at 03/16/2019 1118 Last data filed at 03/16/2019 0547 Gross per 24 hour  Intake 1221.4 ml  Output 350 ml  Net 871.4 ml     Physical Exam  Gen: not in distress, fatigue HEENT: no pallor, dry mucosa, supple neck Chest: clear b/l, no added sounds CVS: S1&S2 tachycardic, no murmurs GI: soft, epigastric tenderness ND, BS+ Musculoskeletal: warm, no edema CNS: AAOX1, tremors+    Data Review:    CBC Recent Labs  Lab 03/15/19 1600 03/16/19 0630  WBC 18.2* 13.2*  HGB 14.3 11.3*  HCT 45.1 35.0*  PLT 302 172  MCV 104.2* 103.6*  MCH 33.0 33.4  MCHC 31.7 32.3  RDW 13.7 14.1  LYMPHSABS 2.4  --   MONOABS 1.1*  --   EOSABS 0.0  --   BASOSABS 0.0  --     Chemistries  Recent Labs  Lab 03/15/19 1600 03/16/19 0630  NA 142 149*  K 3.6 4.4  CL 95* 109  CO2 11* 7*  GLUCOSE 82 89  BUN 39* 43*  CREATININE 2.37* 1.88*  CALCIUM 7.3* 6.8*  MG 2.5* 2.1  AST 193* 249*  ALT 57* 68*  ALKPHOS 92 83  BILITOT 0.4 0.5   ------------------------------------------------------------------------------------------------------------------ No results for input(s): CHOL, HDL, LDLCALC, TRIG, CHOLHDL,  LDLDIRECT in the last 72 hours.  No results found for: HGBA1C ------------------------------------------------------------------------------------------------------------------ Recent Labs    03/16/19 0755  TSH 1.272   ------------------------------------------------------------------------------------------------------------------ No results for input(s): VITAMINB12, FOLATE, FERRITIN, TIBC, IRON, RETICCTPCT in the last 72 hours.  Coagulation profile Recent Labs  Lab 03/15/19 2327  INR 1.0    No results for input(s): DDIMER in the last 72 hours.  Cardiac Enzymes No results for input(s): CKMB, TROPONINI, MYOGLOBIN in the last 168 hours.  Invalid input(s): CK ------------------------------------------------------------------------------------------------------------------ No results found for: BNP  Inpatient Medications  Scheduled Meds:  chlorhexidine  15 mL Mouth Rinse BID   Chlorhexidine Gluconate Cloth  6 each Topical Daily   folic acid  1 mg Oral Daily   mouth rinse  15 mL Mouth Rinse q12n4p   mouth rinse  15 mL Mouth Rinse BID   multivitamin with minerals  1 tablet Oral Daily   ondansetron       thiamine injection  100 mg Intravenous Daily   thiamine  100 mg Oral Daily   Or   thiamine  100 mg Intravenous Daily   Continuous Infusions:  cefTRIAXone (ROCEPHIN)  IV Stopped (03/16/19 8182)   dexmedetomidine (PRECEDEX) IV infusion      sodium bicarbonate (isotonic) infusion in sterile water 100 mL/hr at 03/16/19 0547   PRN Meds:.acetaminophen **OR** acetaminophen, LORazepam **OR** LORazepam, ondansetron (ZOFRAN) IV  Micro Results No results found for this or any previous visit (from the past 240 hour(s)).  Radiology Reports Ct Head Wo Contrast  Result Date: 03/15/2019 CLINICAL DATA:  Altered mental status. Suspected ethanol intoxication EXAM: CT HEAD WITHOUT CONTRAST TECHNIQUE: Contiguous axial images were obtained from the base of the skull through  the vertex without intravenous contrast. COMPARISON:  February 21, 2019 FINDINGS: Brain: The ventricles are normal in size and configuration. There is no intracranial mass, hemorrhage, extra-axial fluid collection, or midline shift. The brain parenchyma appears unremarkable. No acute infarct evident. Vascular: There is no hyperdense vessel. There is no evident vascular calcification. Skull: The bony calvarium appears intact. Sinuses/Orbits: There is mucosal thickening and opacification in multiple ethmoid air cells. Other paranasal sinuses are clear. Orbits appear symmetric bilaterally. Other: Mastoid air cells are clear. IMPRESSION: Areas of ethmoid sinus disease.  Study otherwise unremarkable. Electronically Signed   By: Lowella Grip III M.D.   On: 03/15/2019 21:45   Ct Head Wo Contrast  Result Date: 02/21/2019 CLINICAL DATA:  Unresponsive EXAM: CT HEAD WITHOUT CONTRAST CT CERVICAL SPINE WITHOUT CONTRAST TECHNIQUE: Multidetector CT imaging of the head and cervical spine was performed following the standard protocol without intravenous contrast. Multiplanar CT image reconstructions of the cervical spine were also generated. COMPARISON:  CT 02/04/2019, 01/23/2019 FINDINGS: CT HEAD FINDINGS Brain: No evidence of acute infarction, hemorrhage, hydrocephalus, extra-axial collection or mass lesion/mass effect. Vascular: No hyperdense vessel or unexpected calcification. Skull: Normal. Negative for fracture or focal lesion. Sinuses/Orbits: Mucosal thickening in the ethmoid sinuses Other: None CT CERVICAL SPINE FINDINGS Alignment: Unable to adequately evaluate C1-C2 articulation due to marked degree of head rotation. Remainder of the cervical alignment is within normal limits. Facet alignment is within normal limits. Skull base and vertebrae: No acute fracture. No primary bone lesion or focal pathologic process.  Soft tissues and spinal canal: No prevertebral fluid or swelling. No visible canal hematoma. Disc levels:  Mild posterior spurring at C3-C4. Disc spaces are maintained. Upper chest: Negative. Other: None IMPRESSION: 1. Negative non contrasted CT appearance of the brain. 2. No obvious fracture. Unable to adequately evaluate C1-C2 articulation, secondary to severe degree of head rotation and limited utility of the coronal and sagittal views of this region. Rotary subluxation is unable to be excluded on the current images. Electronically Signed   By: Jasmine PangKim  Fujinaga M.D.   On: 02/21/2019 22:09   Ct Cervical Spine Wo Contrast  Result Date: 02/21/2019 CLINICAL DATA:  Unresponsive EXAM: CT HEAD WITHOUT CONTRAST CT CERVICAL SPINE WITHOUT CONTRAST TECHNIQUE: Multidetector CT imaging of the head and cervical spine was performed following the standard protocol without intravenous contrast. Multiplanar CT image reconstructions of the cervical spine were also generated. COMPARISON:  CT 02/04/2019, 01/23/2019 FINDINGS: CT HEAD FINDINGS Brain: No evidence of acute infarction, hemorrhage, hydrocephalus, extra-axial collection or mass lesion/mass effect. Vascular: No hyperdense vessel or unexpected calcification. Skull: Normal. Negative for fracture or focal lesion. Sinuses/Orbits: Mucosal thickening in the ethmoid sinuses Other: None CT CERVICAL SPINE FINDINGS Alignment: Unable to adequately evaluate C1-C2 articulation due to marked degree of head rotation. Remainder of the cervical alignment is within normal limits. Facet alignment is within normal limits. Skull base and vertebrae: No acute fracture. No primary bone lesion or focal pathologic process. Soft tissues and spinal canal: No prevertebral fluid or swelling. No visible canal hematoma. Disc levels: Mild posterior spurring at C3-C4. Disc spaces are maintained. Upper chest: Negative. Other: None IMPRESSION: 1. Negative non contrasted CT appearance of the brain. 2. No obvious fracture. Unable to adequately evaluate C1-C2 articulation, secondary to severe degree of head rotation  and limited utility of the coronal and sagittal views of this region. Rotary subluxation is unable to be excluded on the current images. Electronically Signed   By: Jasmine PangKim  Fujinaga M.D.   On: 02/21/2019 22:09   [place] Abdomen Complete  Result Date: 03/16/2019 CLINICAL DATA:  Transaminitis. EXAM: ABDOMEN ULTRASOUND COMPLETE COMPARISON:  Ultrasound of February 27, 2017. FINDINGS: Gallbladder: No gallstones or wall thickening visualized. No sonographic Murphy sign noted by sonographer. Common bile duct: Diameter: 3 mm which is within normal limits. Liver: No focal lesion identified. Within normal limits in parenchymal echogenicity. Portal vein is patent on color Doppler imaging with normal direction of blood flow towards the liver. IVC: No abnormality visualized. Pancreas: Visualized portion unremarkable. Spleen: Size and appearance within normal limits. Right Kidney: Length: 10.4 cm. Echogenicity within normal limits. No mass or hydronephrosis visualized. Left Kidney: Length: 10.2 cm. Echogenicity within normal limits. No mass or hydronephrosis visualized. Abdominal aorta: No aneurysm visualized. Other findings: Large anechoic abnormality is noted in left upper quadrant which contains multiple internal echoes most consistent with dilated stomach. IMPRESSION: Exam is somewhat limited as patient could not hold still. Large anechoic abnormality with internal echoes is noted in left upper quadrant which most likely represents significantly distended stomach. No other abnormality seen in the abdomen. Electronically Signed   By: Lupita RaiderJames  Green Jr M.D.   On: 03/16/2019 09:37   Dg Chest Portable 1 View  Result Date: 03/15/2019 CLINICAL DATA:  Altered mental status.  Hypotension. EXAM: PORTABLE CHEST 1 VIEW COMPARISON:  February 03, 2019 FINDINGS: There is no edema or consolidation. Heart size and pulmonary vascularity are normal. No adenopathy. There is postop change in the right clavicle. IMPRESSION: No edema or consolidation.  Electronically Signed  By: Bretta Bang III M.D.   On: 03/15/2019 16:32    Time Spent in minutes  35   Genene Kilman M.D on 03/16/2019 at 11:18 AM  Between 7am to 7pm - Pager - [phone]  After 7pm go to [email or web address] - password [facility]  [facility] -  Office  [phone]

## 2019-03-16 NOTE — Progress Notes (Signed)
CRITICAL VALUE ALERT  Critical Value:  Lactic 4.1  Date & Time Notied:  9/17 1235  Provider Notified: Clementeen Graham, MD   Orders Received/Actions taken: awaiting new orders

## 2019-03-16 NOTE — Progress Notes (Signed)
CRITICAL VALUE ALERT  Critical Value:  Calcium 6.4   Date & Time Notied:  5953 03/16/19   Provider Notified: Clementeen Graham, MD   Orders Received/Actions taken: awaiting new orders

## 2019-03-16 NOTE — Consult Note (Addendum)
NAME:  Sheila Graves, MRN:  [id number], DOB:  [birth year], LOS: 0 ADMISSION DATE:  03/15/2019, CONSULTATION DATE:  9/17 REFERRING MD:  Dhungel, CHIEF COMPLAINT:  Acute encephalopathy    Brief History   38 year old female admitted 9/16 w/ working dx of dehydration, acute renal failure and metabolic acidosis and alcohol intoxication.  PCCM asked to evaluate 9/17 for worsening metabolic acidosis   History of present illness   This is a 38 year old white female with a prior history of polysubstance abuse and alcohol abuse as well as prior suicidal ideations.  She presented to the emergency room on 9/16 after being found at a hotel room unresponsive.  There was a empty half gallon of vodka at the scene.  Blood pressure on arrival 80/60. In the emergency room initial evaluation as follows: Serum creatinine 2.37, white blood cell count 18.2.  AST 193, ALT 57.  Blood pressure 72/40 heart rate 123, lipase 17, urine drug screen negative, lactic acid 10.8, ethanol level 451.  Total CK 11,587. She was admitted to the stepdown unit therapeutic interventions have included: IV hydration, telemetry monitoring and CIWA protocol As of 9/17 critical care consulted, metabolic acidosis worse, liver fxn worse and osmolality elevated. At this time Lactic acid and creatinie actually improved. PCCM asked to evaluate   Past Medical History  Alcohol abuse, pancreatitis, thyroid disease, polysubstance abuse.  Significant Hospital Events   8/16 admitted  Consults:  PCCM  Procedures:    Significant Diagnostic Tests:  Serum Osmo: 335 Total CK 11,587(admit)-->11,827(9/17) Lactic acid 10.8 (admit)-->6.9 (9/17) Urine osmo>> Beta Hydroxybutyric acid>>> Repeat ethanol level 9/17>>> UA 9/16: many bacteria, Ketones +20, protein 100, neg nitrates and leukocytes.    Micro Data:  [place] 2 9/16>>> COVID 9/16: neg  UC 9/16>>>  Antimicrobials:    Interim history/subjective:  Not in acute distress but is tremulous    Objective   Blood pressure (Abnormal) 92/58, pulse (Abnormal) 131, temperature 98.3 F (36.8 C), temperature source Oral, resp. rate 16, last menstrual period 02/14/2019, SpO2 100 %.        Intake/Output Summary (Last 24 hours) at 03/16/2019 1104 Last data filed at 03/16/2019 0547 Gross per 24 hour  Intake 1221.4 ml  Output 350 ml  Net 871.4 ml   There were no vitals filed for this visit.  Examination: General: chronically ill appearing white female. Cooperative but tremulous  HENT: NCAT MM are dry. Neck veins are flat, dried emesis on face and chest   Lungs: clear w/ dry cough  Cardiovascular: tachy RRR no MRG Abdomen: not tender + bowel sounds  Extremities: warm and dry  Neuro: awake, tremulous, oriented x 3 currently  GU: voids   Resolved Hospital Problem list     Assessment & Plan:   Anion-gap Metabolic acidosis w/hyperosmolar state  AND what looks like concomitant metabolic alkalosis  Has both high anion gap metabolic acidosis (2/2 lactic acidosis and prob renal failure vs alcohol related Ketosis or also consider other ingested substance such as ethylene glycol and metabolic alk. ? 2/2 vomiting?? -lactic acid elevated from seizure? Had high total CKs. Could also have simply been on floor for prolonged period of time.  -has been on bicarb gtt since 0400 am 9/17 -acid base had been declining.  -she is hyperosmolar but have not checked ethanol level at same time to calculate gap Plan Repeat serum etoh F/u volatile acids and ethyelene glycol, check beta-hydroxybutiric acid  abg Serial chemistries  Cont bicarb gtt for now Checking urine myoglobin  AKI presume 2/2 volume depletion Scr improving. FENa was pre-renal Plan Avoid nephrotoxins Cont IVFs Avoid hypotension Renal dose meds Serial chem Agree w/ foley cath  Fluid and electrolyte imbalance: hypernatremia  Plan Trend serial chemistries.   Alcohol withdrawal syndrome w/ sinus tachycardia and early tremor   Plan Cont IVFs Thiamine and folate Cont CIWA protocol for no; may need   Elevated LFTs. Initially c/w ETOH Plan Cont to trend Careful w/ benzos  Hx of hypothyroidism  Plan F/u as out pt   Anemia w/out bleeding (macrocytic) Plan Trend CBC  Mild leukocytosis w/ dirty urine r/o UTI Plan Cont rocephin for now  Best practice:  Diet: ice chips  Pain/Anxiety/Delirium protocol (if indicated): NA VAP protocol (if indicated): AN DVT prophylaxis: [place] heparin  GI prophylaxis: PPI Glucose control: na Mobility: BR Code Status: full code  Family Communication: pending  Disposition:  Critically ill due to multiple metabolic derangements requiring complex decision making.  Labs   CBC: Recent Labs  Lab 03/15/19 1600 03/16/19 0630  WBC 18.2* 13.2*  NEUTROABS 14.5*  --   HGB 14.3 11.3*  HCT 45.1 35.0*  MCV 104.2* 103.6*  PLT 302 440    Basic Metabolic Panel: Recent Labs  Lab 03/15/19 1600 03/15/19 2359 03/16/19 0630  NA 142  --  149*  K 3.6  --  4.4  CL 95*  --  109  CO2 11*  --  7*  GLUCOSE 82  --  89  BUN 39*  --  43*  CREATININE 2.37*  --  1.88*  CALCIUM 7.3*  --  6.8*  MG 2.5*  --  2.1  PHOS  --  8.5* 7.3*   GFR: CrCl cannot be calculated (Unknown ideal weight.). Recent Labs  Lab 03/15/19 1600 03/15/19 2254 03/16/19 0630 03/16/19 0755  WBC 18.2*  --  13.2*  --   LATICACIDVEN  --  10.8*  --  6.9*    Liver Function Tests: Recent Labs  Lab 03/15/19 1600 03/16/19 0630  AST 193* 249*  ALT 57* 68*  ALKPHOS 92 83  BILITOT 0.4 0.5  PROT 6.5 5.6*  ALBUMIN 3.8 3.4*   Recent Labs  Lab 03/15/19 1600  LIPASE 17   No results for input(s): AMMONIA in the last 168 hours.  ABG    Component Value Date/Time   PHART 7.223 (L) 03/16/2019 0630   PCO2ART 22.1 (L) 03/16/2019 0630   PO2ART 113 (H) 03/16/2019 0630   HCO3 8.8 (L) 03/16/2019 0630   TCO2 26 02/22/2019 0100   ACIDBASEDEF 17.4 (H) 03/16/2019 0630   O2SAT 97.5 03/16/2019 0630      Coagulation Profile: Recent Labs  Lab 03/15/19 2327  INR 1.0    Cardiac Enzymes: Recent Labs  Lab 03/15/19 2327  CKTOTAL 11,587*    HbA1C: No results found for: HGBA1C  CBG: No results for input(s): GLUCAP in the last 168 hours.  Review of Systems:   Not able   Past Medical History  She,  has a past medical history of Alcohol withdrawal seizure ([place]), ETOH abuse, Hypokalemia, Pancreatitis, and Thyroid disease.   Surgical History    Past Surgical History:  Procedure Laterality Date  . FINGER SURGERY    . IRRIGATION AND DEBRIDEMENT SHOULDER Right 11/12/2018   Procedure: Irrigation And Debridement Shoulder;  Surgeon: Shona Needles, MD;  Location: [facility];  Service: Orthopedics;  Laterality: Right;  . ORIF CLAVICULAR FRACTURE Right 11/12/2018   Procedure: OPEN REDUCTION INTERNAL FIXATION (ORIF) CLAVICULAR FRACTURE;  Surgeon: Doreatha Martin,  Thomasene Lot, MD;  Location: [facility];  Service: Orthopedics;  Laterality: Right;     Social History   reports that she has been smoking. She has been smoking about 1.00 pack per day. She has never used smokeless tobacco. She reports current alcohol use. She reports previous drug use. Drug: Heroin.   Family History   Her family history includes Alcohol abuse in an other family member.   Allergies No Known Allergies   Home Medications  Prior to Admission medications   Not on File     Critical care time:  43 minutes     Erick Colace ACNP-BC [place] Pager # [phone] OR # [phone] if no answer   PCCM Attending:   38 yo alcoholic, polysub abuse history. Admitted for alcohol intoxication, alcoholic hepatitis, lactic acidosis. After fluid resuscitation numbers are improving. Large volume hematemesis this morning into the floor.   Labs pending for toxic alcohol. But alcohol level positive with high anion gap  BP (!) 92/58   Pulse (!) 129   Temp 98.3 F (36.8 C) (Oral)   Resp 20   Wt 62.1 kg   LMP 02/14/2019    SpO2 100%   BMI 19.09 kg/m   Gen: anxious, tremulous  HENT: no nystagmus, tracking  Heart: regular, tachy Lungs: CTAB  Abd: pain with palpation in the abdomen, mainly epigastric  Ext: no edema   Labs: reviewed, elevated AST/ALT  SCR 1.6 LA improving, clearing   A: Alcoholic Hepatitis  Alcohol Withdrawal  Lactic Acidosis  AKI  Rhabdomyolysis  Possible UTI  P: Ativan PRN  Librium TID  IVF, with bicarb infusion started overnight  DF is low, no need for steroids  zofran for nausea protonix 38m IV  ?ceftriaxone for UTI  Recheck labs this afternoon   This patient is critically ill with multiple organ system failure; which, requires frequent high complexity decision making, assessment, support, evaluation, and titration of therapies. This was completed through the application of advanced monitoring technologies and extensive interpretation of multiple databases. During this encounter critical care time was devoted to patient care services described in this note for 34 minutes.   BGarner Nash DO [facility] 03/16/2019 2:17 PM  Personal pager: [phone] unanswered, please page CCM On-call: [phone]

## 2019-03-16 NOTE — Consult Note (Addendum)
Renal Service Consult Note [facility]  Sheila DuraLeslie Graves 03/16/2019 Sheila Graves Requesting Physician:  Dr Gonzella Lexhungel  Reason for Consult:  AKI HPI: The patient is a 38 y.o. year-old w/ hx of pancreatitis, ETOH abuse w/ hx withdrawal presented to ED yesterday w/ poor MS, responding to pain only. BP's 80/60 in ED. Hx etoh/ drug abuse.  In ED creat was 2.36, WBC 18k and etoh 451. prob UTI.  Pt was admitted and given IVF's for dehydration severe, thiamine, CIWA protocol w/ Ativan.  Past hx of SI, none yesterday.  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  Creat was 2.37 on admit and 1.8 today. CPK is 11,587.  Lactic acid 10.8 > 6.9.  Na 146, CO2 11 > 7, AG 36 > 33. Albumin 3.8, AST 249, ALT 68, Tbili 0.5.  Hb 14 > 11   UA 9/16 > cloudy, large Hb, 20 ketones, 5.0, many bact, 20-50 wbc/ no rbc. UDS negative. Serum osm this am is ^^335 (275- 295).  ETOH yest was 451, repeat pending.  With yest etoh level > osm gap is zero.  ETOH level today is likely much lower though.   Pt d/o sore legs and thighs, thirsty and hungery, no SOB , cough or abd pain . No hx kidney disease, voiding issues or hematuria.  Has long hx 10-20 yrs of etoh abuse, drinks about 1 fifth of vodka per day.   ROS  n/a   Past Medical History  Past Medical History:  Diagnosis Date  . Alcohol withdrawal seizure (HCC)   . ETOH abuse   . Hypokalemia   . Pancreatitis   . Thyroid disease    Past Surgical History  Past Surgical History:  Procedure Laterality Date  . FINGER SURGERY    . IRRIGATION AND DEBRIDEMENT SHOULDER Right 11/12/2018   Procedure: Irrigation And Debridement Shoulder;  Surgeon: Roby LoftsHaddix, Kevin P, MD;  Location: [facility] OR;  Service: Orthopedics;  Laterality: Right;  . ORIF CLAVICULAR FRACTURE Right 11/12/2018   Procedure: OPEN REDUCTION INTERNAL FIXATION (ORIF) CLAVICULAR FRACTURE;  Surgeon: Roby LoftsHaddix, Kevin P, MD;  Location: [facility] OR;  Service: Orthopedics;  Laterality: Right;   Family History  Family History  Problem Relation Age  of Onset  . Alcohol abuse Other    Social History  reports that she has been smoking. She has been smoking about 1.00 pack per day. She has never used smokeless tobacco. She reports current alcohol use. She reports previous drug use. Drug: Heroin. Allergies No Known Allergies Home medications Prior to Admission medications   Not on File   Liver Function Tests Recent Labs  Lab 03/15/19 1600 03/16/19 0630  AST 193* 249*  ALT 57* 68*  ALKPHOS 92 83  BILITOT 0.4 0.5  PROT 6.5 5.6*  ALBUMIN 3.8 3.4*   Recent Labs  Lab 03/15/19 1600  LIPASE 17   CBC Recent Labs  Lab 03/15/19 1600 03/16/19 0630  WBC 18.2* 13.2*  NEUTROABS 14.5*  --   HGB 14.3 11.3*  HCT 45.1 35.0*  MCV 104.2* 103.6*  PLT 302 172   Basic Metabolic Panel Recent Labs  Lab 03/15/19 1600 03/15/19 2359 03/16/19 0630  NA 142  --  149*  K 3.6  --  4.4  CL 95*  --  109  CO2 11*  --  7*  GLUCOSE 82  --  89  BUN 39*  --  43*  CREATININE 2.37*  --  1.88*  CALCIUM 7.3*  --  6.8*  PHOS  --  8.5*  7.3*   Iron/TIBC/Ferritin/ %Sat No results found for: IRON, TIBC, FERRITIN, IRONPCTSAT  Vitals:   03/16/19 0600 03/16/19 0700 03/16/19 0800 03/16/19 1201  BP: (!) 71/37 98/62 (!) 92/58   Pulse: (!) 131 (!) 137 (!) 131 (!) 129  Resp: (!) 21 16  20   Temp:  98.3 F (36.8 C)    TempSrc:  Oral    SpO2: 100% 100%  100%  Weight:    62.1 kg    Exam Gen restless, pleasant, Ox 3, in restraints No rash, cyanosis or gangrene Sclera anicteric, throat clear and slighlty dry  No jvd or bruits, flat neck veins Chest clear bilat to bases RRR no MRG Abd soft , nondist, tender L mid abd,  no mass or ascites +bs GU defer MS no joint effusions or deformity Ext no LE or UE edema, no wounds or ulcers, skin turgor slightly down Neuro is alert, Ox 3 , nf, restless, fidgety    Home meds:  - none     CPK is 11,587.  Lactic acid 10.8 > 6.9.  Na 146, CO2 11 > 7, AG 36 > 33. Albumin 3.8, AST 249, ALT 68, Tbili 0.5.  Hb 14 >  11  UA 9/16 > cloudy, large Hb, 20 ketones, 5.0, many bact, 20-50 wbc/ no rbc UDS negative Serum osm this am is ^^335 (275- 295), calculated Osm is 333 using yesterday etoh level 451.  Repeat Etoh pending for recalculation of Osm gap.   UNa 31 > 106, UCr 167   Assessment/ Plan: 1. AKI - in severely vol depleted alcoholic w/ CPK 97,673.  Hemodynamic +/- ATN.  Making some urine.  Is still vol depleted on exam, will rebolus and cont IVF"s at 200 cc/hr total. Will change IVF's to more hypotonic and add D5W to clear ketones.  Creat improving w/ IVF's.  2. Anion gap metabolic acidosis - +osm gap is present but could be due to ketosis.  Will add dextrose to IVF's for this.  No hx of suggest SI or volatile alcohol ingestion, levels pending.  3. Vol depletion - as above 4. Etoh abuse / withdrawal       Kelly Splinter  MD 03/16/2019, 12:21 PM

## 2019-03-16 NOTE — Progress Notes (Signed)
CRITICAL VALUE ALERT  Critical Value:  Serum Osmolality 335   Date & Time Notied:  9/17 1017  Provider Notified: Clementeen Graham, MD   Orders Received/Actions taken: No new orders

## 2019-03-16 NOTE — Progress Notes (Signed)
Osmolar gap 10 Calculated w/ most recent BMP/osmo and etoh  Cont current rx  Erick Colace ACNP-BC [place] Pager # [phone] OR # [phone] if no answer

## 2019-03-16 NOTE — Progress Notes (Signed)
Dear Doctor:  This patient has been identified as a candidate for PICC for the following reason (s): IV therapy over 48 hours, drug pH or osmolality (causing phlebitis, infiltration in 24 hours) and drug extravasation potential with tissue necrosis (KCL, Dilantin, Dopamine, CaCl, MgSO4, chemo vesicant) If you agree, please write an order for the indicated device. For any questions contact the Vascular Access Team at [phone] if no answer, please leave a message.  Thank you for supporting the early vascular access assessment program.

## 2019-03-16 NOTE — ED Notes (Signed)
Attempted to obtain blood work from IVs and straight stick, unsuccessful at this time

## 2019-03-16 NOTE — ED Notes (Signed)
Pt sheets and brief changed. Pt placed into a hospital gown and cords for monitor reattached. Pt pulling on cords, educated patient on importance for keeping the monitor on

## 2019-03-16 NOTE — ED Notes (Signed)
Spoke to Dr. Hilbert Bible, made aware of patients status and transfer to the stepdown ICU

## 2019-03-17 DIAGNOSIS — G9341 Metabolic encephalopathy: Secondary | ICD-10-CM

## 2019-03-17 DIAGNOSIS — F10239 Alcohol dependence with withdrawal, unspecified: Secondary | ICD-10-CM

## 2019-03-17 LAB — BASIC METABOLIC PANEL
Anion gap: 11 (ref 5–15)
Anion gap: 12 (ref 5–15)
Anion gap: 16 — ABNORMAL HIGH (ref 5–15)
Anion gap: 30 — ABNORMAL HIGH (ref 5–15)
BUN: 23 mg/dL — ABNORMAL HIGH (ref 6–20)
BUN: 27 mg/dL — ABNORMAL HIGH (ref 6–20)
BUN: 32 mg/dL — ABNORMAL HIGH (ref 6–20)
BUN: 43 mg/dL — ABNORMAL HIGH (ref 6–20)
CO2: 12 mmol/L — ABNORMAL LOW (ref 22–32)
CO2: 19 mmol/L — ABNORMAL LOW (ref 22–32)
CO2: 23 mmol/L (ref 22–32)
CO2: 25 mmol/L (ref 22–32)
Calcium: 6.3 mg/dL — CL (ref 8.9–10.3)
Calcium: 6.7 mg/dL — ABNORMAL LOW (ref 8.9–10.3)
Calcium: 7.2 mg/dL — ABNORMAL LOW (ref 8.9–10.3)
Calcium: 7.6 mg/dL — ABNORMAL LOW (ref 8.9–10.3)
Chloride: 104 mmol/L (ref 98–111)
Chloride: 105 mmol/L (ref 98–111)
Chloride: 106 mmol/L (ref 98–111)
Chloride: 107 mmol/L (ref 98–111)
Creatinine, Ser: 1.03 mg/dL — ABNORMAL HIGH (ref 0.44–1.00)
Creatinine, Ser: 1.18 mg/dL — ABNORMAL HIGH (ref 0.44–1.00)
Creatinine, Ser: 1.49 mg/dL — ABNORMAL HIGH (ref 0.44–1.00)
Creatinine, Ser: 1.79 mg/dL — ABNORMAL HIGH (ref 0.44–1.00)
GFR calc Af Amer: 42 mL/min — ABNORMAL LOW (ref >60)
GFR calc Af Amer: 52 mL/min — ABNORMAL LOW (ref >60)
GFR calc Af Amer: >60 mL/min (ref >60)
GFR calc Af Amer: >60 mL/min (ref >60)
GFR calc non Af Amer: 36 mL/min — ABNORMAL LOW (ref >60)
GFR calc non Af Amer: 45 mL/min — ABNORMAL LOW (ref >60)
GFR calc non Af Amer: 59 mL/min — ABNORMAL LOW (ref >60)
GFR calc non Af Amer: >60 mL/min (ref >60)
Glucose, Bld: 132 mg/dL — ABNORMAL HIGH (ref 70–99)
Glucose, Bld: 133 mg/dL — ABNORMAL HIGH (ref 70–99)
Glucose, Bld: 158 mg/dL — ABNORMAL HIGH (ref 70–99)
Glucose, Bld: 56 mg/dL — ABNORMAL LOW (ref 70–99)
Potassium: 3.2 mmol/L — ABNORMAL LOW (ref 3.5–5.1)
Potassium: 3.5 mmol/L (ref 3.5–5.1)
Potassium: 3.6 mmol/L (ref 3.5–5.1)
Potassium: 3.9 mmol/L (ref 3.5–5.1)
Sodium: 140 mmol/L (ref 135–145)
Sodium: 141 mmol/L (ref 135–145)
Sodium: 142 mmol/L (ref 135–145)
Sodium: 147 mmol/L — ABNORMAL HIGH (ref 135–145)

## 2019-03-17 LAB — CBC
HCT: 25.6 % — ABNORMAL LOW (ref 36.0–46.0)
Hemoglobin: 8.5 g/dL — ABNORMAL LOW (ref 12.0–15.0)
MCH: 32.6 pg (ref 26.0–34.0)
MCHC: 33.2 g/dL (ref 30.0–36.0)
MCV: 98.1 fL (ref 80.0–100.0)
Platelets: 122 10*3/uL — ABNORMAL LOW (ref 150–400)
RBC: 2.61 MIL/uL — ABNORMAL LOW (ref 3.87–5.11)
RDW: 14.2 % (ref 11.5–15.5)
WBC: 4.8 10*3/uL (ref 4.0–10.5)
nRBC: 0 % (ref 0.0–0.2)

## 2019-03-17 LAB — GLUCOSE, CAPILLARY: Glucose-Capillary: 126 mg/dL — ABNORMAL HIGH (ref 70–99)

## 2019-03-17 LAB — PHOSPHORUS: Phosphorus: 1.1 mg/dL — ABNORMAL LOW (ref 2.5–4.6)

## 2019-03-17 LAB — HEPATIC FUNCTION PANEL
ALT: 58 U/L — ABNORMAL HIGH (ref 0–44)
AST: 201 U/L — ABNORMAL HIGH (ref 15–41)
Albumin: 2.8 g/dL — ABNORMAL LOW (ref 3.5–5.0)
Alkaline Phosphatase: 55 U/L (ref 38–126)
Bilirubin, Direct: 0.1 mg/dL (ref 0.0–0.2)
Indirect Bilirubin: 0.7 mg/dL (ref 0.3–0.9)
Total Bilirubin: 0.8 mg/dL (ref 0.3–1.2)
Total Protein: 4.8 g/dL — ABNORMAL LOW (ref 6.5–8.1)

## 2019-03-17 LAB — CK: Total CK: 6984 U/L — ABNORMAL HIGH (ref 38–234)

## 2019-03-17 LAB — MAGNESIUM: Magnesium: 1.8 mg/dL (ref 1.7–2.4)

## 2019-03-17 LAB — MRSA PCR SCREENING: MRSA by PCR: POSITIVE — AB

## 2019-03-17 MED ORDER — CALCIUM GLUCONATE-NACL 2-0.675 GM/100ML-% IV SOLN
2.0000 g | Freq: Once | INTRAVENOUS | Status: AC
  Administered 2019-03-17: 2000 mg via INTRAVENOUS
  Filled 2019-03-17: qty 100

## 2019-03-17 MED ORDER — POTASSIUM CHLORIDE 10 MEQ/100ML IV SOLN
10.0000 meq | INTRAVENOUS | Status: AC
  Administered 2019-03-17 (×4): 10 meq via INTRAVENOUS
  Filled 2019-03-17 (×4): qty 100

## 2019-03-17 MED ORDER — ENOXAPARIN SODIUM 40 MG/0.4ML ~~LOC~~ SOLN
40.0000 mg | SUBCUTANEOUS | Status: DC
  Administered 2019-03-18: 09:00:00 40 mg via SUBCUTANEOUS
  Filled 2019-03-17: qty 0.4

## 2019-03-17 MED ORDER — MAGNESIUM SULFATE 2 GM/50ML IV SOLN
2.0000 g | Freq: Once | INTRAVENOUS | Status: AC
  Administered 2019-03-17: 2 g via INTRAVENOUS
  Filled 2019-03-17: qty 50

## 2019-03-17 MED ORDER — MUPIROCIN 2 % EX OINT
1.0000 "application " | TOPICAL_OINTMENT | Freq: Two times a day (BID) | CUTANEOUS | Status: DC
  Administered 2019-03-17 – 2019-03-21 (×9): 1 via NASAL
  Filled 2019-03-17 (×2): qty 22

## 2019-03-17 MED ORDER — FOLIC ACID 5 MG/ML IJ SOLN
1.0000 mg | Freq: Every day | INTRAMUSCULAR | Status: DC
  Administered 2019-03-17 – 2019-03-20 (×3): 1 mg via INTRAVENOUS
  Filled 2019-03-17 (×4): qty 0.2

## 2019-03-17 NOTE — Progress Notes (Signed)
[facility] lab was sent down, by this RN, at approximately 2330. Labs resulted about 30-45 minutes later. At 0200, lab called and reported a critical lab value of calcium 6.3. I questioned this because I had not sent down any more labs that would have been resulting at this time. Lab reported that they ran the same tube of blood twice, after it had sat for a few hours. They stated they could not take the lab results out, even though they were believed to be inaccurate. Calcium was 6.7 on initial result; e-link was notified and orders placed. I explained the situation of the second results in the chart to the e-link nurse. Agricultural consultant notified. Blood sugar on second result was 59; CBG immediately checked and resulted at 129. I will continue to monitor and send another lab draw down at 0500.

## 2019-03-17 NOTE — Progress Notes (Signed)
[facility] Progress Note  Subjective: creat down 1.0, AG normalized. Phos 1.1  Vitals:   03/17/19 1100 03/17/19 1130 03/17/19 1200 03/17/19 1400  BP: 128/80 119/76 (!) 132/91 120/89  Pulse: 63 95 99 68  Resp: (!) 21     Temp:   97.9 F (36.6 C)   TempSrc:   Axillary   SpO2: 97%  98%   Weight:        Inpatient medications: . chlordiazePOXIDE  25 mg Oral QID  . chlorhexidine  15 mL Mouth Rinse BID  . Chlorhexidine Gluconate Cloth  6 each Topical Daily  . enoxaparin (LOVENOX) injection  40 mg Subcutaneous Q24H  . folic acid  1 mg Intravenous Daily  . mouth rinse  15 mL Mouth Rinse BID  . multivitamin with minerals  1 tablet Oral Daily  . mupirocin ointment  1 application Nasal BID  . pantoprazole (PROTONIX) IV  40 mg Intravenous Q12H  . sodium chloride flush  10-40 mL Intracatheter Q12H  . thiamine injection  100 mg Intravenous Daily  . thiamine  100 mg Oral Daily   Or  . thiamine  100 mg Intravenous Daily   . sodium chloride 100 mL/hr at 03/17/19 1036  . sodium chloride Stopped (03/16/19 1934)  . cefTRIAXone (ROCEPHIN)  IV Stopped (03/17/19 0529)  .  sodium bicarbonate  infusion 1000 mL 100 mL/hr at 03/17/19 1036   sodium chloride, LORazepam **OR** LORazepam, ondansetron (ZOFRAN) IV, sodium chloride flush    Exam: Gen restless, pleasant, Ox 3, in restraints No rash, cyanosis or gangrene Sclera anicteric, throat clear and slighlty dry  No jvd or bruits, flat neck veins Chest clear bilat to bases RRR no MRG Abd soft , nondist, tender L mid abd,  no mass or ascites +bs Ext no LE or UE edema Neuro is alert, Ox 3     Home meds:  - none  UA 9/16 > cloudy, large Hb, 20 ketones, 5.0, many bact, 20-50 wbc/ no rbc UDS negative Serum osm this am is ^^335 (275- 295), calculated Osm is 333 using yesterday etoh level 451.  Repeat Etoh pending for recalculation of Osm gap.   UNa 31 > 106, UCr 167   Assessment/ Plan: 1. AKI - in severely vol depleted  alcoholic w/ CPK 03,500.  Hemodynamic +/- ATN.  Renal function has normalized, CPK down 1.9.  Would cont IVF"s for next 24- 48 hrs. Anion gap closed. No other suggestions. Will sign off.  2. Anion gap metabolic acidosis - better 3. Vol depletion - as above 4. Etoh abuse / withdrawal      Rob Doctor, hospital 03/17/2019, 3:49 PM  Iron/TIBC/Ferritin/ %Sat No results found for: IRON, TIBC, FERRITIN, IRONPCTSAT Recent Labs  Lab 03/15/19 2327  03/17/19 1104  NA  --    < > 140  K  --    < > 3.2*  CL  --    < > 104  CO2  --    < > 25  GLUCOSE  --    < > 132*  BUN  --    < > 23*  CREATININE  --    < > 1.03*  CALCIUM  --    < > 7.2*  PHOS  --    < > 1.1*  ALBUMIN  --    < > 2.8*  INR 1.0  --   --    < > = values in this interval not displayed.   Recent Labs  Lab 03/17/19  1104  AST 201*  ALT 58*  ALKPHOS 55  BILITOT 0.8  PROT 4.8*   Recent Labs  Lab 03/17/19 0500  WBC 4.8  HGB 8.5*  HCT 25.6*  PLT 122*

## 2019-03-17 NOTE — Progress Notes (Addendum)
PROGRESS NOTE                                                                                                                                                                                                             Patient Demographics:    Sheila Graves, is a 38 y.o. female, DOB - 1981/02/16, PIR:[id number]  Admit date - 03/15/2019   Admitting Physician Therisa Doyne, MD  Outpatient Primary MD for the patient is Patient, No Pcp Per  LOS - 1  Outpatient Specialists: none  Chief Complaint  Patient presents with   Hypotension       Brief Narrative   38 year old female with alcohol abuse and history of suicidal ideations, prior history of IV drug use was found in a hotel unresponsive.  EMS found half a gallon of vodka at the scene.  She was hypotensive with blood pressure 80/60 mmHg and CBG of 78.  1 month back she was brought to the ED by EMS after found unresponsive with significant alcohol level and early withdrawal but signed out AMA the same night. Patient is confused and provides limited history.  Says she only drank vodka and no methanol, antifreeze or any rubbing alcohol or mouthwash.  Patient found to be acute severe metabolic acidosis with anion gap in the 30s, severe lactic acidosis in the 10s, ATN, transaminitis and CPK of 11,000.  Alcohol level of 450s.   Subjective:   Patient was tachycardic with soft blood pressure throughout the day.  Requiring intermittent Ativan.  She pulled her Foley out.  Early this morning she was given Ativan and patient has been restless and poorly arousable.  Heart rate and blood pressure have remained stable.  PICC line placed for IV access but noted to have bleeding from that site.   Assessment  & Plan :    Active Problems: Sepsis, noninfectious with multiorgan failure Likely alcoholic hepatitis with severe metabolic acidosis and ketosis. Screening for other volatile's  (ethylene glycol, methanol and isopropyl alcohol was negative) Blood pressure and heart rate improving.  ATN improving (unable to monitor urine output as she pulled her Foley last night), anion gap closed (was 33).  Continue IV fluids. Appreciate nephrology and CCM consult. Patient's condition is still guarded.  Acute symptoms Acute rhabdomyolysis CPK 11,000.  Getting aggressive IV hydration.  Urine drug screen negative for  cocaine.  Follow a.m. labs   Acute tubular necrosis Prerenal (Fena 0.3) ATN.  Improving with aggressive IV hydration.  Avoid nephrotoxins.  Renal consult appreciated.  Alcohol intoxication with withdrawal  transaminitis with alcohol level in the 450s.  CIWA in the 20s.  Getting IV Ativan as needed.  Low threshold for Precedex drip.  Being monitored on CIWA.  Abdominal ultrasound shows no definite liver or GB pathology. Suggests a distended abdomen.  Avoiding NG tube placement with concern for coffee-ground emesis.  Currently n.p.o.  Acute metabolic encephalopathy Combination of alcohol intoxication/withdrawal and dehydration.  Monitor closely  transaminitis Secondary to alcoholic hepatitis. Discriminant score is low and does not need steroids at present.  A.m. lab pending.  Major depressive disorder with psychotic behavior Has been hospitalized to [facility] in the past.  As per wife she has ongoing issue with drinking and multiple ED visits.  Patient has expressed being suicidal and threatening the wife multiple times. Psych consult placed. Patient does not have capacity to make complex medical decision at present.  ?UTI  on empiric rocephin. Follow cx  Hypocalcemia Replenished.  Prolonged QTC Resolved currently  Code Status : full code  Family Communication  : Spoke with wife at bedside.  Disposition Plan  : pending  Barriers For Discharge : active symptoms  Consults  :  PCCM/ renal  Procedures  : US abd, PICC line  DVT Prophylaxis  :  Lovenox -  Lab  Results  Component Value Date   PLT 172 03/16/2019    Antibiotics  :    Anti-infectives (From admission, onward)   Start     Dose/Rate Route Frequency Ordered Stop   03/16/19 0415  cefTRIAXone (ROCEPHIN) 1 g in sodium chloride 0.9 % 100 mL IVPB     1 g 200 mL/hr over 30 Minutes Intravenous Daily 03/16/19 0403          Objective:   Vitals:   03/17/19 0400 03/17/19 0500 03/17/19 0502 03/17/19 0700  BP: 121/85 (!) 123/100  118/84  Pulse: (!) 103 (!) 115  96  Resp: (!) 22 (!) 27  (!) 24  Temp: 98.8 F (37.1 C)     TempSrc: Axillary     SpO2: 93% 91%  96%  Weight:   65.6 kg     Wt Readings from Last 3 Encounters:  03/17/19 65.6 kg  01/18/19 (S) 61 kg  09/14/18 61.2 kg     Intake/Output Summary (Last 24 hours) at 03/17/2019 0839 Last data filed at 03/17/2019 0554 Gross per 24 hour  Intake 4839.57 ml  Output 260 ml  Net 4579.57 ml    Physical exam Restless, poorly arousable HEENT: Dry mucosa, supple neck Chest: Clear bilaterally CVs: S1-S2 normal, no murmurs GI: Soft, mild distention, nontender, bowel sounds present, small amount of vaginal bleeding Musculoskeletal: Warm, no edema CNS: Restless and poorly arousable    Data Review:    CBC Recent Labs  Lab 03/15/19 1600 03/16/19 0630  WBC 18.2* 13.2*  HGB 14.3 11.3*  HCT 45.1 35.0*  PLT 302 172  MCV 104.2* 103.6*  MCH 33.0 33.4  MCHC 31.7 32.3  RDW 13.7 14.1  LYMPHSABS 2.4  --   MONOABS 1.1*  --   EOSABS 0.0  --   BASOSABS 0.0  --     Chemistries  Recent Labs  Lab 03/15/19 1600 03/16/19 0630 03/16/19 1203 03/16/19 2340 03/17/19 0000 03/17/19 0500  NA 142 149* 146* 142 147* 141  K 3.6 4.4 3.4* 3.5  3.9 3.6  CL 95* 109 106 107 105 106  CO2 11* 7* 15* 19* 12* 23  GLUCOSE 82 89 156* 158* 56* 133*  BUN 39* 43* 40* 32* 43* 27*  CREATININE 2.37* 1.88* 1.64* 1.49* 1.79* 1.18*  CALCIUM 7.3* 6.8* 6.4* 6.7* 6.3* 7.6*  MG 2.5* 2.1  --   --   --   --   AST 193* 249*  --   --   --   --   ALT 57*  68*  --   --   --   --   ALKPHOS 92 83  --   --   --   --   BILITOT 0.4 0.5  --   --   --   --    ------------------------------------------------------------------------------------------------------------------ No results for input(s): CHOL, HDL, LDLCALC, TRIG, CHOLHDL, LDLDIRECT in the last 72 hours.  No results found for: HGBA1C ------------------------------------------------------------------------------------------------------------------ Recent Labs    03/16/19 0755  TSH 1.272   ------------------------------------------------------------------------------------------------------------------ No results for input(s): VITAMINB12, FOLATE, FERRITIN, TIBC, IRON, RETICCTPCT in the last 72 hours.  Coagulation profile Recent Labs  Lab 03/15/19 2327  INR 1.0    No results for input(s): DDIMER in the last 72 hours.  Cardiac Enzymes No results for input(s): CKMB, TROPONINI, MYOGLOBIN in the last 168 hours.  Invalid input(s): CK ------------------------------------------------------------------------------------------------------------------ No results found for: BNP  Inpatient Medications  Scheduled Meds:  chlordiazePOXIDE  25 mg Oral QID   chlorhexidine  15 mL Mouth Rinse BID   Chlorhexidine Gluconate Cloth  6 each Topical Daily   folic acid  1 mg Oral Daily   heparin injection (subcutaneous)  5,000 Units Subcutaneous Q8H   mouth rinse  15 mL Mouth Rinse BID   multivitamin with minerals  1 tablet Oral Daily   pantoprazole (PROTONIX) IV  40 mg Intravenous Q12H   sodium chloride flush  10-40 mL Intracatheter Q12H   thiamine injection  100 mg Intravenous Daily   thiamine  100 mg Oral Daily   Or   thiamine  100 mg Intravenous Daily   Continuous Infusions:  sodium chloride Stopped (03/17/19 0552)   sodium chloride Stopped (03/16/19 1934)   cefTRIAXone (ROCEPHIN)  IV Stopped (03/17/19 0529)    sodium bicarbonate  infusion 1000 mL Stopped (03/17/19 0552)     PRN Meds:.sodium chloride, LORazepam **OR** LORazepam, ondansetron (ZOFRAN) IV, sodium chloride flush  Micro Results Recent Results (from the past 240 hour(s))  Urine culture     Status: None   Collection Time: 03/15/19  4:10 PM   Specimen: Urine, Random  Result Value Ref Range Status   Specimen Description   Final    URINE, RANDOM Performed at [facility], [address] [place], [place] [address]    Special Requests   Final    NONE Performed at [facility], [address] [place], [place] [address]    Culture   Final    NO GROWTH Performed at [facility], [address] [place], [place] [address]    Report Status 03/16/2019 FINAL  Final  Blood culture (routine x 2)     Status: None (Preliminary result)   Collection Time: 03/15/19  5:02 PM   Specimen: BLOOD LEFT HAND  Result Value Ref Range Status   Specimen Description   Final    BLOOD LEFT HAND Performed at [facility], [address], [place], [place] [address]    Special Requests   Final  BOTTLES DRAWN AEROBIC AND ANAEROBIC Blood Culture results may not be optimal due to an inadequate volume of blood received in culture bottles Performed at [facility], [address] [place], [place] [address]    Culture   Final    NO GROWTH < 24 HOURS Performed at [facility], [address] [place], [place] [address]    Report Status PENDING  Incomplete  SARS CORONAVIRUS 2 (TAT 6-24 HRS) Nasopharyngeal Nasopharyngeal Swab     Status: None   Collection Time: 03/15/19 10:47 PM   Specimen: Nasopharyngeal Swab  Result Value Ref Range Status   SARS Coronavirus 2 NEGATIVE NEGATIVE Final    Comment: (NOTE) SARS-CoV-2 target nucleic acids are NOT DETECTED. The SARS-CoV-2 RNA is generally detectable in upper and lower respiratory specimens during the acute phase of infection. Negative results do not preclude SARS-CoV-2 infection, do  not rule out co-infections with other pathogens, and should not be used as the sole basis for treatment or other patient management decisions. Negative results must be combined with clinical observations, patient history, and epidemiological information. The expected result is Negative. Fact Sheet for Patients: HairSlick.nohttps://www.fda.gov/media/138098/download Fact Sheet for Healthcare Providers: quierodirigir.comhttps://www.fda.gov/media/138095/download This test is not yet approved or cleared by the [place] FDA and  has been authorized for detection and/or diagnosis of SARS-CoV-2 by FDA under an Emergency Use Authorization (EUA). This EUA will remain  in effect (meaning this test can be used) for the duration of the COVID-19 declaration under Section 56 4(b)(1) of the Act, 21 U.S.C. section 360bbb-3(b)(1), unless the authorization is terminated or revoked sooner. Performed at [facility], [address] [place], [place] [address]     Radiology Reports Ct Head Wo Contrast  Result Date: 03/15/2019 CLINICAL DATA:  Altered mental status. Suspected ethanol intoxication EXAM: CT HEAD WITHOUT CONTRAST TECHNIQUE: Contiguous axial images were obtained from the base of the skull through the vertex without intravenous contrast. COMPARISON:  February 21, 2019 FINDINGS: Brain: The ventricles are normal in size and configuration. There is no intracranial mass, hemorrhage, extra-axial fluid collection, or midline shift. The brain parenchyma appears unremarkable. No acute infarct evident. Vascular: There is no hyperdense vessel. There is no evident vascular calcification. Skull: The bony calvarium appears intact. Sinuses/Orbits: There is mucosal thickening and opacification in multiple ethmoid air cells. Other paranasal sinuses are clear. Orbits appear symmetric bilaterally. Other: Mastoid air cells are clear. IMPRESSION: Areas of ethmoid sinus disease.  Study otherwise unremarkable. Electronically Signed   By:  Bretta BangWilliam  Woodruff III M.D.   On: 03/15/2019 21:45   Ct Head Wo Contrast  Result Date: 02/21/2019 CLINICAL DATA:  Unresponsive EXAM: CT HEAD WITHOUT CONTRAST CT CERVICAL SPINE WITHOUT CONTRAST TECHNIQUE: Multidetector CT imaging of the head and cervical spine was performed following the standard protocol without intravenous contrast. Multiplanar CT image reconstructions of the cervical spine were also generated. COMPARISON:  CT 02/04/2019, 01/23/2019 FINDINGS: CT HEAD FINDINGS Brain: No evidence of acute infarction, hemorrhage, hydrocephalus, extra-axial collection or mass lesion/mass effect. Vascular: No hyperdense vessel or unexpected calcification. Skull: Normal. Negative for fracture or focal lesion. Sinuses/Orbits: Mucosal thickening in the ethmoid sinuses Other: None CT CERVICAL SPINE FINDINGS Alignment: Unable to adequately evaluate C1-C2 articulation due to marked degree of head rotation. Remainder of the cervical alignment is within normal limits. Facet alignment is within normal limits. Skull base and vertebrae: No acute fracture. No primary bone lesion or focal pathologic process. Soft tissues and spinal canal: No prevertebral fluid or  swelling. No visible canal hematoma. Disc levels: Mild posterior spurring at C3-C4. Disc spaces are maintained. Upper chest: Negative. Other: None IMPRESSION: 1. Negative non contrasted CT appearance of the brain. 2. No obvious fracture. Unable to adequately evaluate C1-C2 articulation, secondary to severe degree of head rotation and limited utility of the coronal and sagittal views of this region. Rotary subluxation is unable to be excluded on the current images. Electronically Signed   By: Donavan Foil M.D.   On: 02/21/2019 22:09   Ct Cervical Spine Wo Contrast  Result Date: 02/21/2019 CLINICAL DATA:  Unresponsive EXAM: CT HEAD WITHOUT CONTRAST CT CERVICAL SPINE WITHOUT CONTRAST TECHNIQUE: Multidetector CT imaging of the head and cervical spine was performed  following the standard protocol without intravenous contrast. Multiplanar CT image reconstructions of the cervical spine were also generated. COMPARISON:  CT 02/04/2019, 01/23/2019 FINDINGS: CT HEAD FINDINGS Brain: No evidence of acute infarction, hemorrhage, hydrocephalus, extra-axial collection or mass lesion/mass effect. Vascular: No hyperdense vessel or unexpected calcification. Skull: Normal. Negative for fracture or focal lesion. Sinuses/Orbits: Mucosal thickening in the ethmoid sinuses Other: None CT CERVICAL SPINE FINDINGS Alignment: Unable to adequately evaluate C1-C2 articulation due to marked degree of head rotation. Remainder of the cervical alignment is within normal limits. Facet alignment is within normal limits. Skull base and vertebrae: No acute fracture. No primary bone lesion or focal pathologic process. Soft tissues and spinal canal: No prevertebral fluid or swelling. No visible canal hematoma. Disc levels: Mild posterior spurring at C3-C4. Disc spaces are maintained. Upper chest: Negative. Other: None IMPRESSION: 1. Negative non contrasted CT appearance of the brain. 2. No obvious fracture. Unable to adequately evaluate C1-C2 articulation, secondary to severe degree of head rotation and limited utility of the coronal and sagittal views of this region. Rotary subluxation is unable to be excluded on the current images. Electronically Signed   By: Donavan Foil M.D.   On: 02/21/2019 22:09   US Abdomen Complete  Result Date: 03/16/2019 CLINICAL DATA:  Transaminitis. EXAM: ABDOMEN ULTRASOUND COMPLETE COMPARISON:  Ultrasound of February 27, 2017. FINDINGS: Gallbladder: No gallstones or wall thickening visualized. No sonographic Murphy sign noted by sonographer. Common bile duct: Diameter: 3 mm which is within normal limits. Liver: No focal lesion identified. Within normal limits in parenchymal echogenicity. Portal vein is patent on color Doppler imaging with normal direction of blood flow towards  the liver. IVC: No abnormality visualized. Pancreas: Visualized portion unremarkable. Spleen: Size and appearance within normal limits. Right Kidney: Length: 10.4 cm. Echogenicity within normal limits. No mass or hydronephrosis visualized. Left Kidney: Length: 10.2 cm. Echogenicity within normal limits. No mass or hydronephrosis visualized. Abdominal aorta: No aneurysm visualized. Other findings: Large anechoic abnormality is noted in left upper quadrant which contains multiple internal echoes most consistent with dilated stomach. IMPRESSION: Exam is somewhat limited as patient could not hold still. Large anechoic abnormality with internal echoes is noted in left upper quadrant which most likely represents significantly distended stomach. No other abnormality seen in the abdomen. Electronically Signed   By: Marijo Conception M.D.   On: 03/16/2019 09:37   Dg Chest Portable 1 View  Result Date: 03/15/2019 CLINICAL DATA:  Altered mental status.  Hypotension. EXAM: PORTABLE CHEST 1 VIEW COMPARISON:  February 03, 2019 FINDINGS: There is no edema or consolidation. Heart size and pulmonary vascularity are normal. No adenopathy. There is postop change in the right clavicle. IMPRESSION: No edema or consolidation. Electronically Signed   By: Lowella Grip III M.D.  On: 03/15/2019 16:32   [place] Ekg Site Rite  Result Date: 03/16/2019 If Site Rite image not attached, placement could not be confirmed due to current cardiac rhythm.   Time Spent in minutes  35   Usama Harkless M.D on 03/17/2019 at 8:39 AM  Between 7am to 7pm - Pager - [phone]  After 7pm go to [email or web address] - password [facility]  [facility] -  Office  [phone]

## 2019-03-17 NOTE — Consult Note (Signed)
Attempted to see patient but unable to participate in interview due to altered mental status. Informed primary team to reconsult psychiatry when patient is able to participate in interview.

## 2019-03-17 NOTE — Progress Notes (Signed)
Received call from patients parents, explained HIPAA and reasoning behind being unable to update them as they were not on the patients list to provide information. Parents upset but reasonable. Explained updates would need to come from patients wife. Per her parents the patients wife gives them illogical information and contradicts herself, they're concerned that she is using illicit substances and making decisions for their daughter. Patients wife, Sheila Graves, called upset that the patients family was getting information from staff, explained the previous conversation and that no updates had been given.  [name] stated taht Morgin wouldn't want her parents to be updated, and under no circumstances were we to update patients family because Mulan had strained relations with her parents and wouldn't want them to know about drug and alcohol issues. Wife reassured that she is still patients contact and sole visitor.

## 2019-03-18 DIAGNOSIS — D649 Anemia, unspecified: Secondary | ICD-10-CM

## 2019-03-18 DIAGNOSIS — E876 Hypokalemia: Secondary | ICD-10-CM

## 2019-03-18 LAB — COMPREHENSIVE METABOLIC PANEL
ALT: 50 U/L — ABNORMAL HIGH (ref 0–44)
AST: 138 U/L — ABNORMAL HIGH (ref 15–41)
Albumin: 2.8 g/dL — ABNORMAL LOW (ref 3.5–5.0)
Alkaline Phosphatase: 55 U/L (ref 38–126)
Anion gap: 10 (ref 5–15)
BUN: 8 mg/dL (ref 6–20)
CO2: 28 mmol/L (ref 22–32)
Calcium: 7.6 mg/dL — ABNORMAL LOW (ref 8.9–10.3)
Chloride: 102 mmol/L (ref 98–111)
Creatinine, Ser: 0.53 mg/dL (ref 0.44–1.00)
GFR calc Af Amer: >60 mL/min (ref >60)
GFR calc non Af Amer: >60 mL/min (ref >60)
Glucose, Bld: 100 mg/dL — ABNORMAL HIGH (ref 70–99)
Potassium: 3.4 mmol/L — ABNORMAL LOW (ref 3.5–5.1)
Sodium: 140 mmol/L (ref 135–145)
Total Bilirubin: 0.9 mg/dL (ref 0.3–1.2)
Total Protein: 4.9 g/dL — ABNORMAL LOW (ref 6.5–8.1)

## 2019-03-18 LAB — BASIC METABOLIC PANEL
Anion gap: 11 (ref 5–15)
BUN: 14 mg/dL (ref 6–20)
CO2: 27 mmol/L (ref 22–32)
Calcium: 7.5 mg/dL — ABNORMAL LOW (ref 8.9–10.3)
Chloride: 100 mmol/L (ref 98–111)
Creatinine, Ser: 0.64 mg/dL (ref 0.44–1.00)
GFR calc Af Amer: >60 mL/min (ref >60)
GFR calc non Af Amer: >60 mL/min (ref >60)
Glucose, Bld: 116 mg/dL — ABNORMAL HIGH (ref 70–99)
Potassium: 3.2 mmol/L — ABNORMAL LOW (ref 3.5–5.1)
Sodium: 138 mmol/L (ref 135–145)

## 2019-03-18 LAB — CBC
HCT: 25.8 % — ABNORMAL LOW (ref 36.0–46.0)
Hemoglobin: 8.8 g/dL — ABNORMAL LOW (ref 12.0–15.0)
MCH: 34 pg (ref 26.0–34.0)
MCHC: 34.1 g/dL (ref 30.0–36.0)
MCV: 99.6 fL (ref 80.0–100.0)
Platelets: 94 10*3/uL — ABNORMAL LOW (ref 150–400)
RBC: 2.59 MIL/uL — ABNORMAL LOW (ref 3.87–5.11)
RDW: 13.8 % (ref 11.5–15.5)
WBC: 4.2 10*3/uL (ref 4.0–10.5)
nRBC: 0 % (ref 0.0–0.2)

## 2019-03-18 LAB — MAGNESIUM: Magnesium: 1.9 mg/dL (ref 1.7–2.4)

## 2019-03-18 LAB — CK: Total CK: 2529 U/L — ABNORMAL HIGH (ref 38–234)

## 2019-03-18 LAB — PHOSPHORUS: Phosphorus: 1.4 mg/dL — ABNORMAL LOW (ref 2.5–4.6)

## 2019-03-18 MED ORDER — POTASSIUM CHLORIDE 10 MEQ/50ML IV SOLN
10.0000 meq | INTRAVENOUS | Status: AC
  Administered 2019-03-18 (×4): 10 meq via INTRAVENOUS
  Filled 2019-03-18 (×4): qty 50

## 2019-03-18 MED ORDER — POTASSIUM CHLORIDE 10 MEQ/100ML IV SOLN: 10.0000 meq | INTRAVENOUS | Status: DC

## 2019-03-18 MED ORDER — CHLORHEXIDINE GLUCONATE 0.12 % MT SOLN
15.0000 mL | Freq: Two times a day (BID) | OROMUCOSAL | Status: DC
  Administered 2019-03-18 – 2019-03-21 (×7): 15 mL via OROMUCOSAL
  Filled 2019-03-18 (×5): qty 15

## 2019-03-18 MED ORDER — DEXMEDETOMIDINE HCL IN NACL 200 MCG/50ML IV SOLN
0.4000 ug/kg/h | INTRAVENOUS | Status: DC
  Administered 2019-03-18 – 2019-03-19 (×4): 0.4 ug/kg/h via INTRAVENOUS
  Filled 2019-03-18 (×4): qty 50

## 2019-03-18 MED ORDER — ORAL CARE MOUTH RINSE
15.0000 mL | Freq: Two times a day (BID) | OROMUCOSAL | Status: DC
  Administered 2019-03-18 – 2019-03-20 (×5): 15 mL via OROMUCOSAL

## 2019-03-18 NOTE — Progress Notes (Signed)
When parents visiting patient called "mama" and both parents stated that she recognized them at the bedside.

## 2019-03-18 NOTE — Progress Notes (Addendum)
PROGRESS NOTE                                                                                                                                                                                                             Patient Demographics:    Sheila Graves, is a 38 y.o. female, DOB - [birth year], LYY:[id number]  Admit date - 03/15/2019   Admitting Physician Toy Baker, MD  Outpatient Primary MD for the patient is Patient, No Pcp Per  LOS - 2  Outpatient Specialists: none  Chief Complaint  Patient presents with   Hypotension       Brief Narrative   38 year old female with alcohol abuse and history of suicidal ideations, prior history of IV drug use was found in a hotel unresponsive.  EMS found half a gallon of vodka at the scene.  She was hypotensive with blood pressure 80/60 mmHg and CBG of 78.  1 month back she was brought to the ED by EMS after found unresponsive with significant alcohol level and early withdrawal but signed out AMA the same night. Patient is confused and provides limited history.  Says she only drank vodka and no methanol, antifreeze or any rubbing alcohol or mouthwash.  Patient found to be acute severe metabolic acidosis with anion gap in the 30s, severe lactic acidosis in the 10s, ATN, transaminitis and CPK of 11,000.  Alcohol level of 450s.   Subjective:   Patient continues to actively withdraw.  Restless and confused.  Requiring Ativan almost every 2 hours.  Thinks he is at home.  Assessment  & Plan :    Active Problems: Sepsis, noninfectious with multiorgan failure Likely alcoholic hepatitis + severe metabolic acidosis and ketosis. Negative for other volatile's (ethylene glycol, methanol and isopropyl alcohol) Blood pressure and heart rate improving. ATN improving, a.m. lab pending.  Anion gap closed.  Continue half-normal saline. Appreciate nephrology and CCM consult. Patient's  condition is still guarded.  Acute symptoms  Alcohol withdrawal with acute delirium/acute metabolic encephalopathy Has been requiring frequent Ativan.  Will start her on Precedex drip and monitor..  Acute rhabdomyolysis CPK 11k on presentation, slowly improving.  Continue hydration.  Urine drug screen negative.  Follow closely.   Acute tubular necrosis Prerenal (Fena 0.3) ATN.  Improving with aggressive hydration.  Renal consult appreciated.   Alcohol intoxication with withdrawal  transaminitis with alcohol level in the 450s.  CIWA in the 20s.  Getting IV Ativan as needed.  Low threshold for Precedex drip.  Being monitored on CIWA.  Abdominal ultrasound shows no definite liver or GB pathology. Suggests a distended abdomen.  Avoiding NG tube placement with concern for coffee-ground emesis.  Currently n.p.o.  Anemia Noted for significant drop in H&H (hemoglobin of 8.5 from 14 on admission).  Reportedly also had coffee-ground emesis x1.  Will monitor closely.  On IV Protonix twice daily.  transaminitis Secondary to alcoholic hepatitis. Discriminant score is low and does not need steroids at present.  Slowly improving.  Follow a.m. lab.  Major depressive disorder with psychotic behavior [facility].  As per wife she has ongoing issue with drinking and multiple ED visits.  Patient has expressed being suicidal and threatening the wife multiple times. Psych consulted but patient is encephalopathic and withdrawing.  Cannot participate in a conversation.  Will consult back again. Patient currently does not have capacity to make complex medical decision at present.  ?UTI Received 3 days of Rocephin.  Discontinue.  Hypocalcemia Replenished.  Hypokalemia Replenished  Hypophosphatemia Will replenish.  High risk of going to refeeding.  Prolonged QTC Resolved currently  Code Status : full code  Family Communication  : I will update wife.  Disposition  Plan  : pending  Barriers For Discharge : active symptoms  Consults  :  PCCM/ renal  Procedures  : US abd, PICC line  DVT Prophylaxis  :  Lovenox -  Lab Results  Component Value Date   PLT 122 (L) 03/17/2019    Antibiotics  :    Anti-infectives (From admission, onward)   Start     Dose/Rate Route Frequency Ordered Stop   03/16/19 0415  cefTRIAXone (ROCEPHIN) 1 g in sodium chloride 0.9 % 100 mL IVPB  Status:  Discontinued     1 g 200 mL/hr over 30 Minutes Intravenous Daily 03/16/19 0403 03/18/19 0846        Objective:   Vitals:   03/18/19 0600 03/18/19 0700 03/18/19 0800 03/18/19 0900  BP: (!) 122/93 112/82 (!) 126/91 125/89  Pulse: 93 77 74 85  Resp: (!) 25 (!) 22 (!) 22 (!) 30  Temp:   98.2 F (36.8 C)   TempSrc:      SpO2: 100% 100% 100% 100%  Weight:        Wt Readings from Last 3 Encounters:  03/17/19 65.6 kg  01/18/19 (S) 61 kg  09/14/18 61.2 kg     Intake/Output Summary (Last 24 hours) at 03/18/2019 0952 Last data filed at 03/18/2019 [id number] Gross per 24 hour  Intake 5440.6 ml  Output 1600 ml  Net 3840.6 ml   Physical exam Restless, confused, HEENT: Moist mucosa, supple neck Chest: Clear CVs: Normal S1-S2, no murmurs GI: Soft, nondistended, right upper quadrant tenderness, bowel sounds present Musculoskeletal: Warm, no edema CNS: Restless, somnolent but arousable and confused.    Data Review:    CBC Recent Labs  Lab 03/15/19 1600 03/16/19 0630 03/17/19 0500  WBC 18.2* 13.2* 4.8  HGB 14.3 11.3* 8.5*  HCT 45.1 35.0* 25.6*  PLT 302 172 122*  MCV 104.2* 103.6* 98.1  MCH 33.0 33.4 32.6  MCHC 31.7 32.3 33.2  RDW 13.7 14.1 14.2  LYMPHSABS 2.4  --   --   MONOABS 1.1*  --   --   EOSABS 0.0  --   --   BASOSABS  0.0  --   --     Chemistries  Recent Labs  Lab 03/15/19 1600 03/16/19 0630  03/16/19 2340 03/17/19 0000 03/17/19 0500 03/17/19 1104 03/18/19 0015  NA 142 149*   < > 142 147* 141 140 138  K 3.6 4.4   < > 3.5 3.9 3.6 3.2*  3.2*  CL 95* 109   < > 107 105 106 104 100  CO2 11* 7*   < > 19* 12* 23 25 27   GLUCOSE 82 89   < > 158* 56* 133* 132* 116*  BUN 39* 43*   < > 32* 43* 27* 23* 14  CREATININE 2.37* 1.88*   < > 1.49* 1.79* 1.18* 1.03* 0.64  CALCIUM 7.3* 6.8*   < > 6.7* 6.3* 7.6* 7.2* 7.5*  MG 2.5* 2.1  --   --   --   --  1.8  --   AST 193* 249*  --   --   --   --  201*  --   ALT 57* 68*  --   --   --   --  58*  --   ALKPHOS 92 83  --   --   --   --  55  --   BILITOT 0.4 0.5  --   --   --   --  0.8  --    < > = values in this interval not displayed.   ------------------------------------------------------------------------------------------------------------------ No results for input(s): CHOL, HDL, LDLCALC, TRIG, CHOLHDL, LDLDIRECT in the last 72 hours.  No results found for: HGBA1C ------------------------------------------------------------------------------------------------------------------ Recent Labs    03/16/19 0755  TSH 1.272   ------------------------------------------------------------------------------------------------------------------ No results for input(s): VITAMINB12, FOLATE, FERRITIN, TIBC, IRON, RETICCTPCT in the last 72 hours.  Coagulation profile Recent Labs  Lab 03/15/19 2327  INR 1.0    No results for input(s): DDIMER in the last 72 hours.  Cardiac Enzymes No results for input(s): CKMB, TROPONINI, MYOGLOBIN in the last 168 hours.  Invalid input(s): CK ------------------------------------------------------------------------------------------------------------------ No results found for: BNP  Inpatient Medications  Scheduled Meds:  chlordiazePOXIDE  25 mg Oral QID   chlorhexidine  15 mL Mouth Rinse BID   chlorhexidine  15 mL Mouth Rinse BID   Chlorhexidine Gluconate Cloth  6 each Topical Daily   enoxaparin (LOVENOX) injection  40 mg Subcutaneous Q24H   folic acid  1 mg Intravenous Daily   mouth rinse  15 mL Mouth Rinse BID   mouth rinse  15 mL Mouth  Rinse q12n4p   multivitamin with minerals  1 tablet Oral Daily   mupirocin ointment  1 application Nasal BID   pantoprazole (PROTONIX) IV  40 mg Intravenous Q12H   sodium chloride flush  10-40 mL Intracatheter Q12H   thiamine injection  100 mg Intravenous Daily   thiamine  100 mg Oral Daily   Or   thiamine  100 mg Intravenous Daily   Continuous Infusions:  sodium chloride Stopped (03/18/19 0752)   sodium chloride Stopped (03/16/19 1934)   dexmedetomidine (PRECEDEX) IV infusion 0.4 mcg/kg/hr (03/18/19 0928)   PRN Meds:.sodium chloride, LORazepam **OR** LORazepam, ondansetron (ZOFRAN) IV, sodium chloride flush  Micro Results Recent Results (from the past 240 hour(s))  Urine culture     Status: None   Collection Time: 03/15/19  4:10 PM   Specimen: Urine, Random  Result Value Ref Range Status   Specimen Description   Final    URINE, RANDOM Performed at [facility], [address] [place],  [place] [address]    Special Requests   Final    NONE Performed at [facility], [address] [place], [place] [address]    Culture   Final    NO GROWTH Performed at [facility], [address] [place], [place] [address]    Report Status 03/16/2019 FINAL  Final  Blood culture (routine x 2)     Status: None (Preliminary result)   Collection Time: 03/15/19  5:02 PM   Specimen: BLOOD LEFT HAND  Result Value Ref Range Status   Specimen Description   Final    BLOOD LEFT HAND Performed at [facility], [address], [place], [place] [address]    Special Requests   Final    BOTTLES DRAWN AEROBIC AND ANAEROBIC Blood Culture results may not be optimal due to an inadequate volume of blood received in culture bottles Performed at [facility], [address], [place], [place] [address]    Culture   Final    NO GROWTH 2 DAYS Performed at [facility], [address], [place], [place]  [address]    Report Status PENDING  Incomplete  SARS CORONAVIRUS 2 (TAT 6-24 HRS) Nasopharyngeal Nasopharyngeal Swab     Status: None   Collection Time: 03/15/19 10:47 PM   Specimen: Nasopharyngeal Swab  Result Value Ref Range Status   SARS Coronavirus 2 NEGATIVE NEGATIVE Final    Comment: (NOTE) SARS-CoV-2 target nucleic acids are NOT DETECTED. The SARS-CoV-2 RNA is generally detectable in upper and lower respiratory specimens during the acute phase of infection. Negative results do not preclude SARS-CoV-2 infection, do not rule out co-infections with other pathogens, and should not be used as the sole basis for treatment or other patient management decisions. Negative results must be combined with clinical observations, patient history, and epidemiological information. The expected result is Negative. Fact Sheet for Patients: HairSlick.no Fact Sheet for Healthcare Providers: quierodirigir.com This test is not yet approved or cleared by the [place] FDA and  has been authorized for detection and/or diagnosis of SARS-CoV-2 by FDA under an Emergency Use Authorization (EUA). This EUA will remain  in effect (meaning this test can be used) for the duration of the COVID-19 declaration under Section 56 4(b)(1) of the Act, 21 U.S.C. section 360bbb-3(b)(1), unless the authorization is terminated or revoked sooner. Performed at [facility], [address], [place], [place] [address]   MRSA PCR Screening     Status: Abnormal   Collection Time: 03/16/19 11:57 PM   Specimen: Nasal Mucosa; Nasopharyngeal  Result Value Ref Range Status   MRSA by PCR POSITIVE (A) NEGATIVE Final    Comment:        The GeneXpert MRSA Assay (FDA approved for NASAL specimens only), is one component of a comprehensive MRSA colonization surveillance program. It is not intended to diagnose MRSA infection nor to guide or monitor treatment for MRSA  infections. RESULT CALLED TO, READ BACK BY AND VERIFIED WITH: LANI RN AT 9629 ON 03/17/2019 BY S.VANHOORNE Performed at [facility], [address], [place], [place] [address]     Radiology Reports Ct Head Wo Contrast  Result Date: 03/15/2019 CLINICAL DATA:  Altered mental status. Suspected ethanol intoxication EXAM: CT HEAD WITHOUT CONTRAST TECHNIQUE: Contiguous axial images were obtained from the base of the skull through the vertex without intravenous contrast. COMPARISON:  February 21, 2019 FINDINGS: Brain: The ventricles are normal in size and configuration. There  is no intracranial mass, hemorrhage, extra-axial fluid collection, or midline shift. The brain parenchyma appears unremarkable. No acute infarct evident. Vascular: There is no hyperdense vessel. There is no evident vascular calcification. Skull: The bony calvarium appears intact. Sinuses/Orbits: There is mucosal thickening and opacification in multiple ethmoid air cells. Other paranasal sinuses are clear. Orbits appear symmetric bilaterally. Other: Mastoid air cells are clear. IMPRESSION: Areas of ethmoid sinus disease.  Study otherwise unremarkable. Electronically Signed   By: Bretta Bang III M.D.   On: 03/15/2019 21:45   Ct Head Wo Contrast  Result Date: 02/21/2019 CLINICAL DATA:  Unresponsive EXAM: CT HEAD WITHOUT CONTRAST CT CERVICAL SPINE WITHOUT CONTRAST TECHNIQUE: Multidetector CT imaging of the head and cervical spine was performed following the standard protocol without intravenous contrast. Multiplanar CT image reconstructions of the cervical spine were also generated. COMPARISON:  CT 02/04/2019, 01/23/2019 FINDINGS: CT HEAD FINDINGS Brain: No evidence of acute infarction, hemorrhage, hydrocephalus, extra-axial collection or mass lesion/mass effect. Vascular: No hyperdense vessel or unexpected calcification. Skull: Normal. Negative for fracture or focal lesion. Sinuses/Orbits: Mucosal thickening in the  ethmoid sinuses Other: None CT CERVICAL SPINE FINDINGS Alignment: Unable to adequately evaluate C1-C2 articulation due to marked degree of head rotation. Remainder of the cervical alignment is within normal limits. Facet alignment is within normal limits. Skull base and vertebrae: No acute fracture. No primary bone lesion or focal pathologic process. Soft tissues and spinal canal: No prevertebral fluid or swelling. No visible canal hematoma. Disc levels: Mild posterior spurring at C3-C4. Disc spaces are maintained. Upper chest: Negative. Other: None IMPRESSION: 1. Negative non contrasted CT appearance of the brain. 2. No obvious fracture. Unable to adequately evaluate C1-C2 articulation, secondary to severe degree of head rotation and limited utility of the coronal and sagittal views of this region. Rotary subluxation is unable to be excluded on the current images. Electronically Signed   By: Jasmine Pang M.D.   On: 02/21/2019 22:09   Ct Cervical Spine Wo Contrast  Result Date: 02/21/2019 CLINICAL DATA:  Unresponsive EXAM: CT HEAD WITHOUT CONTRAST CT CERVICAL SPINE WITHOUT CONTRAST TECHNIQUE: Multidetector CT imaging of the head and cervical spine was performed following the standard protocol without intravenous contrast. Multiplanar CT image reconstructions of the cervical spine were also generated. COMPARISON:  CT 02/04/2019, 01/23/2019 FINDINGS: CT HEAD FINDINGS Brain: No evidence of acute infarction, hemorrhage, hydrocephalus, extra-axial collection or mass lesion/mass effect. Vascular: No hyperdense vessel or unexpected calcification. Skull: Normal. Negative for fracture or focal lesion. Sinuses/Orbits: Mucosal thickening in the ethmoid sinuses Other: None CT CERVICAL SPINE FINDINGS Alignment: Unable to adequately evaluate C1-C2 articulation due to marked degree of head rotation. Remainder of the cervical alignment is within normal limits. Facet alignment is within normal limits. Skull base and vertebrae:  No acute fracture. No primary bone lesion or focal pathologic process. Soft tissues and spinal canal: No prevertebral fluid or swelling. No visible canal hematoma. Disc levels: Mild posterior spurring at C3-C4. Disc spaces are maintained. Upper chest: Negative. Other: None IMPRESSION: 1. Negative non contrasted CT appearance of the brain. 2. No obvious fracture. Unable to adequately evaluate C1-C2 articulation, secondary to severe degree of head rotation and limited utility of the coronal and sagittal views of this region. Rotary subluxation is unable to be excluded on the current images. Electronically Signed   By: Jasmine Pang M.D.   On: 02/21/2019 22:09   US Abdomen Complete  Result Date: 03/16/2019 CLINICAL DATA:  Transaminitis. EXAM: ABDOMEN ULTRASOUND COMPLETE COMPARISON:  Ultrasound of  February 27, 2017. FINDINGS: Gallbladder: No gallstones or wall thickening visualized. No sonographic Murphy sign noted by sonographer. Common bile duct: Diameter: 3 mm which is within normal limits. Liver: No focal lesion identified. Within normal limits in parenchymal echogenicity. Portal vein is patent on color Doppler imaging with normal direction of blood flow towards the liver. IVC: No abnormality visualized. Pancreas: Visualized portion unremarkable. Spleen: Size and appearance within normal limits. Right Kidney: Length: 10.4 cm. Echogenicity within normal limits. No mass or hydronephrosis visualized. Left Kidney: Length: 10.2 cm. Echogenicity within normal limits. No mass or hydronephrosis visualized. Abdominal aorta: No aneurysm visualized. Other findings: Large anechoic abnormality is noted in left upper quadrant which contains multiple internal echoes most consistent with dilated stomach. IMPRESSION: Exam is somewhat limited as patient could not hold still. Large anechoic abnormality with internal echoes is noted in left upper quadrant which most likely represents significantly distended stomach. No other  abnormality seen in the abdomen. Electronically Signed   By: Lupita Raider M.D.   On: 03/16/2019 09:37   Dg Chest Portable 1 View  Result Date: 03/15/2019 CLINICAL DATA:  Altered mental status.  Hypotension. EXAM: PORTABLE CHEST 1 VIEW COMPARISON:  February 03, 2019 FINDINGS: There is no edema or consolidation. Heart size and pulmonary vascularity are normal. No adenopathy. There is postop change in the right clavicle. IMPRESSION: No edema or consolidation. Electronically Signed   By: Bretta Bang III M.D.   On: 03/15/2019 16:32   [place] Ekg Site Rite  Result Date: 03/16/2019 If Site Rite image not attached, placement could not be confirmed due to current cardiac rhythm.   Time Spent in minutes  35   Layne Dilauro M.D on 03/18/2019 at 9:52 AM  Between 7am to 7pm - Pager - [phone]  After 7pm go to [email or web address] - password [facility]  [facility] -  Office  [phone]

## 2019-03-18 NOTE — Progress Notes (Signed)
BMP sent at 1050.  Resulted at [place].  Potassium 3.2.  [place] NP paged.  Awaiting orders

## 2019-03-19 DIAGNOSIS — F10230 Alcohol dependence with withdrawal, uncomplicated: Secondary | ICD-10-CM

## 2019-03-19 DIAGNOSIS — D61818 Other pancytopenia: Secondary | ICD-10-CM

## 2019-03-19 LAB — COMPREHENSIVE METABOLIC PANEL
ALT: 41 U/L (ref 0–44)
AST: 76 U/L — ABNORMAL HIGH (ref 15–41)
Albumin: 2.7 g/dL — ABNORMAL LOW (ref 3.5–5.0)
Alkaline Phosphatase: 62 U/L (ref 38–126)
Anion gap: 8 (ref 5–15)
BUN: 8 mg/dL (ref 6–20)
CO2: 29 mmol/L (ref 22–32)
Calcium: 8.2 mg/dL — ABNORMAL LOW (ref 8.9–10.3)
Chloride: 105 mmol/L (ref 98–111)
Creatinine, Ser: 0.6 mg/dL (ref 0.44–1.00)
GFR calc Af Amer: >60 mL/min (ref >60)
GFR calc non Af Amer: >60 mL/min (ref >60)
Glucose, Bld: 87 mg/dL (ref 70–99)
Potassium: 3.4 mmol/L — ABNORMAL LOW (ref 3.5–5.1)
Sodium: 142 mmol/L (ref 135–145)
Total Bilirubin: 0.9 mg/dL (ref 0.3–1.2)
Total Protein: 4.9 g/dL — ABNORMAL LOW (ref 6.5–8.1)

## 2019-03-19 LAB — CBC
HCT: 28.4 % — ABNORMAL LOW (ref 36.0–46.0)
Hemoglobin: 9.4 g/dL — ABNORMAL LOW (ref 12.0–15.0)
MCH: 33.2 pg (ref 26.0–34.0)
MCHC: 33.1 g/dL (ref 30.0–36.0)
MCV: 100.4 fL — ABNORMAL HIGH (ref 80.0–100.0)
Platelets: 88 10*3/uL — ABNORMAL LOW (ref 150–400)
RBC: 2.83 MIL/uL — ABNORMAL LOW (ref 3.87–5.11)
RDW: 13.2 % (ref 11.5–15.5)
WBC: 3.9 10*3/uL — ABNORMAL LOW (ref 4.0–10.5)
nRBC: 0 % (ref 0.0–0.2)

## 2019-03-19 LAB — MAGNESIUM: Magnesium: 1.6 mg/dL — ABNORMAL LOW (ref 1.7–2.4)

## 2019-03-19 LAB — CK: Total CK: 1017 U/L — ABNORMAL HIGH (ref 38–234)

## 2019-03-19 LAB — PHOSPHORUS: Phosphorus: 1.9 mg/dL — ABNORMAL LOW (ref 2.5–4.6)

## 2019-03-19 MED ORDER — MAGNESIUM SULFATE 2 GM/50ML IV SOLN
2.0000 g | Freq: Once | INTRAVENOUS | Status: AC
  Administered 2019-03-19: 2 g via INTRAVENOUS
  Filled 2019-03-19: qty 50

## 2019-03-19 MED ORDER — POTASSIUM PHOSPHATES 15 MMOLE/5ML IV SOLN
40.0000 meq | Freq: Once | INTRAVENOUS | Status: AC
  Administered 2019-03-19: 10:00:00 40 meq via INTRAVENOUS
  Filled 2019-03-19: qty 9.09

## 2019-03-19 MED ORDER — ACETAMINOPHEN 325 MG PO TABS
650.0000 mg | ORAL_TABLET | Freq: Four times a day (QID) | ORAL | Status: DC | PRN
  Administered 2019-03-19 – 2019-03-20 (×2): 650 mg via ORAL
  Filled 2019-03-19: qty 2

## 2019-03-19 NOTE — Progress Notes (Signed)
PROGRESS NOTE                                                                                                                                                                                                             Patient Demographics:    Sheila Graves, is a 38 y.o. female, DOB - 01-06-1981, ZWC:[id number]  Admit date - 03/15/2019   Admitting Physician Therisa Doyne, MD  Outpatient Primary MD for the patient is Patient, No Pcp Per  LOS - 3  Outpatient Specialists: none  Chief Complaint  Patient presents with   Hypotension       Brief Narrative   38 year old female with alcohol abuse and history of suicidal ideations, prior history of IV drug use was found in a hotel unresponsive.  EMS found half a gallon of vodka at the scene.  She was hypotensive with blood pressure 80/60 mmHg and CBG of 78.  1 month back she was brought to the ED by EMS after found unresponsive with significant alcohol level and early withdrawal but signed out AMA the same night. Patient is confused and provides limited history.  Says she only drank vodka and no methanol, antifreeze or any rubbing alcohol or mouthwash.  Patient found to be acute severe metabolic acidosis with anion gap in the 30s, severe lactic acidosis in the 10s, ATN, transaminitis and CPK of 11,000.  Alcohol level of 450s.   Subjective:   Patient much awake and oriented today.  Not in acute distress.  Feels very tired and wants to eat.  Denies further abdominal pain.  Assessment  & Plan :    Active Problems: Sepsis, noninfectious with multiorgan failure Likely alcoholic hepatitis + severe metabolic acidosis and ketosis. Negative for other volatile's (ethylene glycol, methanol and isopropyl alcohol) Sepsis currently resolved.  Anion gap and renal function normalized. Appreciate nephrology and CCM consult.   Acute symptoms  Alcohol withdrawal with acute delirium/acute  metabolic encephalopathy Significantly improving on Precedex drip.  Patient alert and oriented today.  Will  wean to discontinue.  Acute rhabdomyolysis CPK 11k on presentation, improving to 1000 today.  Continue fluids.   Acute tubular necrosis Prerenal (Fena 0.3) ATN.  Improving with aggressive hydration.  Renal consult appreciated.   Alcohol intoxication with withdrawal  transaminitis with alcohol level in the 450s.  CIWA in the 20s.  Did  not improve with frequent dosing of Ativan and started on Precedex drip with improvement.  Continue to wean Precedex.  LFTs much improved.  Anemia Noted for significant drop in H&H (hemoglobin of 8.5 from 14 on admission).  Reportedly also had coffee-ground emesis x1.  Monitor closely.  Continue IV Protonix twice daily.  transaminitis Secondary to alcoholic hepatitis.  Did not require steroid.  Improving.  Major depressive disorder with psychotic behavior Has been hospitalized to [facility] in the past.  As per wife she has ongoing issue with drinking and multiple ED visits.  Patient has expressed being suicidal and threatening the wife multiple times. Patient wishes to check into alcohol rehab.  Says being in behavioral health previously has helped.  Psych consulted but given her encephalopathy asked to consult later.  Will consult for tomorrow since her mentation is improving   Anemia/thrombocytopenia Possibly associated with acute illness.  Monitor closely.  Off heparin products. ?UTI Received 3 days of Rocephin.  Hypocalcemia Replenished.  Hypokalemia Replenished  Hypophosphatemia Replenished with potassium phosphate.  High risk for going into refeeding.  Prolonged QTC Resolved currently  Code Status : full code  Family Communication  : We will update her wife.  Disposition Plan  : pending.  Possibly BH H Barriers For Discharge : active symptoms  Consults  :  PCCM/ renal  Procedures  : [place] abd, PICC line  DVT Prophylaxis  :  SCDs  Lab Results  Component Value Date   PLT 88 (L) 03/19/2019    Antibiotics  :    Anti-infectives (From admission, onward)   Start     Dose/Rate Route Frequency Ordered Stop   03/16/19 0415  cefTRIAXone (ROCEPHIN) 1 g in sodium chloride 0.9 % 100 mL IVPB  Status:  Discontinued     1 g 200 mL/hr over 30 Minutes Intravenous Daily 03/16/19 0403 03/18/19 0846        Objective:   Vitals:   03/19/19 1000 03/19/19 1030 03/19/19 1100 03/19/19 1109  BP: 124/83 140/82 118/81   Pulse: (!) 52 (!) 51 (!) 53 65  Resp: 17 20 (!) 27 (!) 26  Temp:      TempSrc:      SpO2: 95% 91% 94% 93%  Weight:      Height:        Wt Readings from Last 3 Encounters:  03/17/19 65.6 kg  01/18/19 (S) 61 kg  09/14/18 61.2 kg     Intake/Output Summary (Last 24 hours) at 03/19/2019 1115 Last data filed at 03/19/2019 1109 Gross per 24 hour  Intake 884.83 ml  Output 1600 ml  Net -715.17 ml   Physical exam Not in distress, fatigued, not agitated HEENT: Moist mucosa, supple neck Chest: Clear bilaterally CVs: Normal S1-S2, no murmurs GI: Soft, nondistended, no right upper quadrant tenderness, bowel sounds present Musculoskeletal: Warm, no edema CNS: Fine tremors, alert and oriented    Data Review:    CBC Recent Labs  Lab 03/15/19 1600 03/16/19 0630 03/17/19 0500 03/18/19 0500 03/19/19 0729  WBC 18.2* 13.2* 4.8 4.2 3.9*  HGB 14.3 11.3* 8.5* 8.8* 9.4*  HCT 45.1 35.0* 25.6* 25.8* 28.4*  PLT 302 172 122* 94* 88*  MCV 104.2* 103.6* 98.1 99.6 100.4*  MCH 33.0 33.4 32.6 34.0 33.2  MCHC 31.7 32.3 33.2 34.1 33.1  RDW 13.7 14.1 14.2 13.8 13.2  LYMPHSABS 2.4  --   --   --   --   MONOABS 1.1*  --   --   --   --  EOSABS 0.0  --   --   --   --   BASOSABS 0.0  --   --   --   --     Chemistries  Recent Labs  Lab 03/15/19 1600 03/16/19 0630  03/17/19 0500 03/17/19 1104 03/18/19 0015 03/18/19 0500 03/19/19 0729  NA 142 149*   < > 141 140 138 140 142  K 3.6 4.4   < > 3.6 3.2* 3.2* 3.4*  3.4*  CL 95* 109   < > 106 104 100 102 105  CO2 11* 7*   < > 23 25 27 28 29   GLUCOSE 82 89   < > 133* 132* 116* 100* 87  BUN 39* 43*   < > 27* 23* 14 8 8   CREATININE 2.37* 1.88*   < > 1.18* 1.03* 0.64 0.53 0.60  CALCIUM 7.3* 6.8*   < > 7.6* 7.2* 7.5* 7.6* 8.2*  MG 2.5* 2.1  --   --  1.8  --  1.9 1.6*  AST 193* 249*  --   --  201*  --  138* 76*  ALT 57* 68*  --   --  58*  --  50* 41  ALKPHOS 92 83  --   --  55  --  55 62  BILITOT 0.4 0.5  --   --  0.8  --  0.9 0.9   < > = values in this interval not displayed.   ------------------------------------------------------------------------------------------------------------------ No results for input(s): CHOL, HDL, LDLCALC, TRIG, CHOLHDL, LDLDIRECT in the last 72 hours.  No results found for: HGBA1C ------------------------------------------------------------------------------------------------------------------ No results for input(s): TSH, T4TOTAL, T3FREE, THYROIDAB in the last 72 hours.  Invalid input(s): FREET3 ------------------------------------------------------------------------------------------------------------------ No results for input(s): VITAMINB12, FOLATE, FERRITIN, TIBC, IRON, RETICCTPCT in the last 72 hours.  Coagulation profile Recent Labs  Lab 03/15/19 2327  INR 1.0    No results for input(s): DDIMER in the last 72 hours.  Cardiac Enzymes No results for input(s): CKMB, TROPONINI, MYOGLOBIN in the last 168 hours.  Invalid input(s): CK ------------------------------------------------------------------------------------------------------------------ No results found for: BNP  Inpatient Medications  Scheduled Meds:  chlordiazePOXIDE  25 mg Oral QID   chlorhexidine  15 mL Mouth Rinse BID   Chlorhexidine Gluconate Cloth  6 each Topical Daily   folic acid  1 mg Intravenous Daily   mouth rinse  15 mL Mouth Rinse q12n4p   multivitamin with minerals  1 tablet Oral Daily   mupirocin ointment  1 application  Nasal BID   pantoprazole (PROTONIX) IV  40 mg Intravenous Q12H   sodium chloride flush  10-40 mL Intracatheter Q12H   thiamine injection  100 mg Intravenous Daily   thiamine  100 mg Oral Daily   Or   thiamine  100 mg Intravenous Daily   Continuous Infusions:  sodium chloride Stopped (03/19/19 1009)   sodium chloride Stopped (03/16/19 1934)   dexmedetomidine (PRECEDEX) IV infusion Stopped (03/19/19 1056)   potassium PHOSPHATE IVPB (mEq) 85 mL/hr at 03/19/19 1109   PRN Meds:.sodium chloride, ondansetron (ZOFRAN) IV, sodium chloride flush  Micro Results Recent Results (from the past 240 hour(s))  Urine culture     Status: None   Collection Time: 03/15/19  4:10 PM   Specimen: Urine, Random  Result Value Ref Range Status   Specimen Description   Final    URINE, RANDOM Performed at [facility], [address], [place], [place] [address]    Special Requests   Final    NONE Performed  at [facility], [address] [place], [place] [address]    Culture   Final    NO GROWTH Performed at [facility], [address] [place], [place] [address]    Report Status 03/16/2019 FINAL  Final  Blood culture (routine x 2)     Status: None (Preliminary result)   Collection Time: 03/15/19  5:02 PM   Specimen: BLOOD LEFT HAND  Result Value Ref Range Status   Specimen Description   Final    BLOOD LEFT HAND Performed at [facility], [address] [place], [place] [address]    Special Requests   Final    BOTTLES DRAWN AEROBIC AND ANAEROBIC Blood Culture results may not be optimal due to an inadequate volume of blood received in culture bottles Performed at [facility], [address] [place], [place] [address]    Culture   Final    NO GROWTH 3 DAYS Performed at [facility], [address] [place], [place] [address]    Report Status PENDING  Incomplete  SARS CORONAVIRUS 2 (TAT 6-24 HRS)  Nasopharyngeal Nasopharyngeal Swab     Status: None   Collection Time: 03/15/19 10:47 PM   Specimen: Nasopharyngeal Swab  Result Value Ref Range Status   SARS Coronavirus 2 NEGATIVE NEGATIVE Final    Comment: (NOTE) SARS-CoV-2 target nucleic acids are NOT DETECTED. The SARS-CoV-2 RNA is generally detectable in upper and lower respiratory specimens during the acute phase of infection. Negative results do not preclude SARS-CoV-2 infection, do not rule out co-infections with other pathogens, and should not be used as the sole basis for treatment or other patient management decisions. Negative results must be combined with clinical observations, patient history, and epidemiological information. The expected result is Negative. Fact Sheet for Patients: HairSlick.nohttps://www.fda.gov/media/138098/download Fact Sheet for Healthcare Providers: quierodirigir.comhttps://www.fda.gov/media/138095/download This test is not yet approved or cleared by the [place] FDA and  has been authorized for detection and/or diagnosis of SARS-CoV-2 by FDA under an Emergency Use Authorization (EUA). This EUA will remain  in effect (meaning this test can be used) for the duration of the COVID-19 declaration under Section 56 4(b)(1) of the Act, 21 U.S.C. section 360bbb-3(b)(1), unless the authorization is terminated or revoked sooner. Performed at [facility], [address] [place], [place] [address]   MRSA PCR Screening     Status: Abnormal   Collection Time: 03/16/19 11:57 PM   Specimen: Nasal Mucosa; Nasopharyngeal  Result Value Ref Range Status   MRSA by PCR POSITIVE (A) NEGATIVE Final    Comment:        The GeneXpert MRSA Assay (FDA approved for NASAL specimens only), is one component of a comprehensive MRSA colonization surveillance program. It is not intended to diagnose MRSA infection nor to guide or monitor treatment for MRSA infections. RESULT CALLED TO, READ BACK BY AND VERIFIED WITH: LANI RN AT [id number] ON  03/17/2019 BY S.VANHOORNE Performed at [facility], [address] [place], [place] [address]     Radiology Reports Ct Head Wo Contrast  Result Date: 03/15/2019 CLINICAL DATA:  Altered mental status. Suspected ethanol intoxication EXAM: CT HEAD WITHOUT CONTRAST TECHNIQUE: Contiguous axial images were obtained from the base of the skull through the vertex without intravenous contrast. COMPARISON:  February 21, 2019 FINDINGS: Brain: The ventricles are normal in size and configuration. There is no intracranial mass, hemorrhage, extra-axial fluid collection, or midline shift. The brain parenchyma appears  unremarkable. No acute infarct evident. Vascular: There is no hyperdense vessel. There is no evident vascular calcification. Skull: The bony calvarium appears intact. Sinuses/Orbits: There is mucosal thickening and opacification in multiple ethmoid air cells. Other paranasal sinuses are clear. Orbits appear symmetric bilaterally. Other: Mastoid air cells are clear. IMPRESSION: Areas of ethmoid sinus disease.  Study otherwise unremarkable. Electronically Signed   By: Bretta Bang III M.D.   On: 03/15/2019 21:45   Ct Head Wo Contrast  Result Date: 02/21/2019 CLINICAL DATA:  Unresponsive EXAM: CT HEAD WITHOUT CONTRAST CT CERVICAL SPINE WITHOUT CONTRAST TECHNIQUE: Multidetector CT imaging of the head and cervical spine was performed following the standard protocol without intravenous contrast. Multiplanar CT image reconstructions of the cervical spine were also generated. COMPARISON:  CT 02/04/2019, 01/23/2019 FINDINGS: CT HEAD FINDINGS Brain: No evidence of acute infarction, hemorrhage, hydrocephalus, extra-axial collection or mass lesion/mass effect. Vascular: No hyperdense vessel or unexpected calcification. Skull: Normal. Negative for fracture or focal lesion. Sinuses/Orbits: Mucosal thickening in the ethmoid sinuses Other: None CT CERVICAL SPINE FINDINGS Alignment: Unable to  adequately evaluate C1-C2 articulation due to marked degree of head rotation. Remainder of the cervical alignment is within normal limits. Facet alignment is within normal limits. Skull base and vertebrae: No acute fracture. No primary bone lesion or focal pathologic process. Soft tissues and spinal canal: No prevertebral fluid or swelling. No visible canal hematoma. Disc levels: Mild posterior spurring at C3-C4. Disc spaces are maintained. Upper chest: Negative. Other: None IMPRESSION: 1. Negative non contrasted CT appearance of the brain. 2. No obvious fracture. Unable to adequately evaluate C1-C2 articulation, secondary to severe degree of head rotation and limited utility of the coronal and sagittal views of this region. Rotary subluxation is unable to be excluded on the current images. Electronically Signed   By: Jasmine Pang M.D.   On: 02/21/2019 22:09   Ct Cervical Spine Wo Contrast  Result Date: 02/21/2019 CLINICAL DATA:  Unresponsive EXAM: CT HEAD WITHOUT CONTRAST CT CERVICAL SPINE WITHOUT CONTRAST TECHNIQUE: Multidetector CT imaging of the head and cervical spine was performed following the standard protocol without intravenous contrast. Multiplanar CT image reconstructions of the cervical spine were also generated. COMPARISON:  CT 02/04/2019, 01/23/2019 FINDINGS: CT HEAD FINDINGS Brain: No evidence of acute infarction, hemorrhage, hydrocephalus, extra-axial collection or mass lesion/mass effect. Vascular: No hyperdense vessel or unexpected calcification. Skull: Normal. Negative for fracture or focal lesion. Sinuses/Orbits: Mucosal thickening in the ethmoid sinuses Other: None CT CERVICAL SPINE FINDINGS Alignment: Unable to adequately evaluate C1-C2 articulation due to marked degree of head rotation. Remainder of the cervical alignment is within normal limits. Facet alignment is within normal limits. Skull base and vertebrae: No acute fracture. No primary bone lesion or focal pathologic process. Soft  tissues and spinal canal: No prevertebral fluid or swelling. No visible canal hematoma. Disc levels: Mild posterior spurring at C3-C4. Disc spaces are maintained. Upper chest: Negative. Other: None IMPRESSION: 1. Negative non contrasted CT appearance of the brain. 2. No obvious fracture. Unable to adequately evaluate C1-C2 articulation, secondary to severe degree of head rotation and limited utility of the coronal and sagittal views of this region. Rotary subluxation is unable to be excluded on the current images. Electronically Signed   By: Jasmine Pang M.D.   On: 02/21/2019 22:09   US Abdomen Complete  Result Date: 03/16/2019 CLINICAL DATA:  Transaminitis. EXAM: ABDOMEN ULTRASOUND COMPLETE COMPARISON:  Ultrasound of February 27, 2017. FINDINGS: Gallbladder: No gallstones or wall thickening visualized. No sonographic Murphy sign  noted by sonographer. Common bile duct: Diameter: 3 mm which is within normal limits. Liver: No focal lesion identified. Within normal limits in parenchymal echogenicity. Portal vein is patent on color Doppler imaging with normal direction of blood flow towards the liver. IVC: No abnormality visualized. Pancreas: Visualized portion unremarkable. Spleen: Size and appearance within normal limits. Right Kidney: Length: 10.4 cm. Echogenicity within normal limits. No mass or hydronephrosis visualized. Left Kidney: Length: 10.2 cm. Echogenicity within normal limits. No mass or hydronephrosis visualized. Abdominal aorta: No aneurysm visualized. Other findings: Large anechoic abnormality is noted in left upper quadrant which contains multiple internal echoes most consistent with dilated stomach. IMPRESSION: Exam is somewhat limited as patient could not hold still. Large anechoic abnormality with internal echoes is noted in left upper quadrant which most likely represents significantly distended stomach. No other abnormality seen in the abdomen. Electronically Signed   By: Lupita RaiderJames  Green Jr M.D.    On: 03/16/2019 09:37   Dg Chest Portable 1 View  Result Date: 03/15/2019 CLINICAL DATA:  Altered mental status.  Hypotension. EXAM: PORTABLE CHEST 1 VIEW COMPARISON:  February 03, 2019 FINDINGS: There is no edema or consolidation. Heart size and pulmonary vascularity are normal. No adenopathy. There is postop change in the right clavicle. IMPRESSION: No edema or consolidation. Electronically Signed   By: Bretta BangWilliam  Woodruff III M.D.   On: 03/15/2019 16:32   [place] Ekg Site Rite  Result Date: 03/16/2019 If Site Rite image not attached, placement could not be confirmed due to current cardiac rhythm.   Time Spent in minutes  35   Kekoa Fyock M.D on 03/19/2019 at 11:15 AM  Between 7am to 7pm - Pager - [phone]  After 7pm go to [email or web address] - password [email or web address]  [facility] -  Office  [phone]

## 2019-03-20 DIAGNOSIS — F1020 Alcohol dependence, uncomplicated: Secondary | ICD-10-CM

## 2019-03-20 LAB — PHOSPHORUS: Phosphorus: 2.7 mg/dL (ref 2.5–4.6)

## 2019-03-20 LAB — VITAMIN B12: Vitamin B-12: 465 pg/mL (ref 180–914)

## 2019-03-20 LAB — CBC
HCT: 22.7 % — ABNORMAL LOW (ref 36.0–46.0)
Hemoglobin: 7.6 g/dL — ABNORMAL LOW (ref 12.0–15.0)
MCH: 33.5 pg (ref 26.0–34.0)
MCHC: 33.5 g/dL (ref 30.0–36.0)
MCV: 100 fL (ref 80.0–100.0)
Platelets: 95 10*3/uL — ABNORMAL LOW (ref 150–400)
RBC: 2.27 MIL/uL — ABNORMAL LOW (ref 3.87–5.11)
RDW: 13.2 % (ref 11.5–15.5)
WBC: 3.4 10*3/uL — ABNORMAL LOW (ref 4.0–10.5)
nRBC: 0 % (ref 0.0–0.2)

## 2019-03-20 LAB — COMPREHENSIVE METABOLIC PANEL
ALT: 34 U/L (ref 0–44)
AST: 61 U/L — ABNORMAL HIGH (ref 15–41)
Albumin: 2.4 g/dL — ABNORMAL LOW (ref 3.5–5.0)
Alkaline Phosphatase: 51 U/L (ref 38–126)
Anion gap: 9 (ref 5–15)
BUN: <5 mg/dL — ABNORMAL LOW (ref 6–20)
CO2: 21 mmol/L — ABNORMAL LOW (ref 22–32)
Calcium: 7.3 mg/dL — ABNORMAL LOW (ref 8.9–10.3)
Chloride: 105 mmol/L (ref 98–111)
Creatinine, Ser: 0.53 mg/dL (ref 0.44–1.00)
GFR calc Af Amer: >60 mL/min (ref >60)
GFR calc non Af Amer: >60 mL/min (ref >60)
Glucose, Bld: 84 mg/dL (ref 70–99)
Potassium: 2.9 mmol/L — ABNORMAL LOW (ref 3.5–5.1)
Sodium: 135 mmol/L (ref 135–145)
Total Bilirubin: 0.4 mg/dL (ref 0.3–1.2)
Total Protein: 4.4 g/dL — ABNORMAL LOW (ref 6.5–8.1)

## 2019-03-20 LAB — IRON AND TIBC
Iron: 27 ug/dL — ABNORMAL LOW (ref 28–170)
Saturation Ratios: 13 % (ref 10.4–31.8)
TIBC: 203 ug/dL — ABNORMAL LOW (ref 250–450)
UIBC: 176 ug/dL

## 2019-03-20 LAB — CULTURE, BLOOD (ROUTINE X 2): Culture: NO GROWTH

## 2019-03-20 LAB — MAGNESIUM: Magnesium: 1.4 mg/dL — ABNORMAL LOW (ref 1.7–2.4)

## 2019-03-20 LAB — FERRITIN: Ferritin: 196 ng/mL (ref 11–307)

## 2019-03-20 LAB — CK: Total CK: 755 U/L — ABNORMAL HIGH (ref 38–234)

## 2019-03-20 MED ORDER — MAGNESIUM SULFATE 4 GM/100ML IV SOLN
4.0000 g | Freq: Once | INTRAVENOUS | Status: AC
  Administered 2019-03-20: 4 g via INTRAVENOUS
  Filled 2019-03-20: qty 100

## 2019-03-20 MED ORDER — CHLORDIAZEPOXIDE HCL 25 MG PO CAPS
25.0000 mg | ORAL_CAPSULE | Freq: Three times a day (TID) | ORAL | Status: DC | PRN
  Administered 2019-03-21: 10:00:00 25 mg via ORAL
  Filled 2019-03-20: qty 1

## 2019-03-20 MED ORDER — ZOLPIDEM TARTRATE 5 MG PO TABS
5.0000 mg | ORAL_TABLET | Freq: Every day | ORAL | Status: DC
  Administered 2019-03-20: 21:00:00 5 mg via ORAL
  Filled 2019-03-20: qty 1

## 2019-03-20 MED ORDER — ESCITALOPRAM OXALATE 10 MG PO TABS
10.0000 mg | ORAL_TABLET | Freq: Every day | ORAL | Status: DC
  Administered 2019-03-20 – 2019-03-21 (×2): 10 mg via ORAL
  Filled 2019-03-20 (×2): qty 1

## 2019-03-20 MED ORDER — POTASSIUM CHLORIDE CRYS ER 20 MEQ PO TBCR
40.0000 meq | EXTENDED_RELEASE_TABLET | ORAL | Status: AC
  Administered 2019-03-20 (×2): 40 meq via ORAL
  Filled 2019-03-20 (×3): qty 2

## 2019-03-20 MED ORDER — FOLIC ACID 1 MG PO TABS
1.0000 mg | ORAL_TABLET | Freq: Every day | ORAL | Status: DC
  Administered 2019-03-21: 1 mg via ORAL
  Filled 2019-03-20: qty 1

## 2019-03-20 NOTE — Consult Note (Signed)
Telepsych Consultation   Reason for Consult:  "alcohol abuse, MDD with psychotic symptoms" Referring Physician:  Dr. Clementeen Graham Location of Patient: [facility] Location of Provider: [facility]  Patient Identification: Sheila Graves MRN:  [id number] Principal Diagnosis: Alcohol use disorder, severe, dependence ([place]) Diagnosis:  Active Problems:   Alcohol intoxication ([place])   Hypotension   Dehydration   Prolonged QT interval   Metabolic acidosis   AKI (acute kidney injury) ([place])   Transaminitis   Total Time spent with patient: 1 hour  Subjective:   Sheila Graves is a 38 y.o. female patient admitted with sepsis, noninfectious with multiorgan failure likely due to alcoholic hepatitis with severe metabolic acidosis and ketosis.   HPI:  Per chart review, patient was found in a hotel room unresponsive. EMS found a half a gallon of vodka at the scene. BAL was 451 on admission. She was admitted with sepsis, noninfectious with multiorgan failure likely due to alcoholic hepatitis with severe metabolic acidosis and ketosis. Her hospital course was complicated by acute rhabdomyolysis, acute tubular necrosis, alcohol intoxication with withdrawal and acute metabolic encephalopathy. She has a history of heavy drinking and has had multiple ED visits for alcohol use. She has threatened to harm herself and her wife in the setting of alcohol use.   Of note, [facility] at [facility] for alcohol intoxication and SI. She was psychiatrically cleared and was provided with outpatient substance abuse treatment resources.     On interview, Sheila Graves reports coming to the hospital due to drinking "a lot of alcohol." She drank about 2 bottles of alcohol in two days. She reports problematic alcohol use for 11 years. Her longest period of sobriety was 44 days after completing rehab in 2018. She reports a history of DTs and seizures from alcohol withdrawal. She denies illicit substance use. She reports  depressed mood and therefore self-medicates with alcohol. She has never received medication management for depression. She denies SI. She denies a history of suicide attempts. She denies HI or AVH. She reports a history of hallucinations with alcohol withdrawal. She denies problems with appetite unless she is heavily drinking. She reports chronic insomnia. She She denies a history of manic symptoms.   Past Psychiatric History: MDD and alcohol abuse. History of potassium overdose in 01/2018.   Risk to Self:  None. Denies SI. Risk to Others:  None. Denies HI.  Prior Inpatient Therapy:  She was hospitalized in [facility] in 01/2018 for depression with potassium overdose and alcohol use.  Prior Outpatient Therapy:  [facility] and [facility].   Past Medical History:  Past Medical History:  Diagnosis Date  . Alcohol withdrawal seizure ([place])   . ETOH abuse   . Hypokalemia   . Pancreatitis   . Thyroid disease     Past Surgical History:  Procedure Laterality Date  . FINGER SURGERY    . IRRIGATION AND DEBRIDEMENT SHOULDER Right 11/12/2018   Procedure: Irrigation And Debridement Shoulder;  Surgeon: Shona Needles, MD;  Location: [place];  Service: Orthopedics;  Laterality: Right;  . ORIF CLAVICULAR FRACTURE Right 11/12/2018   Procedure: OPEN REDUCTION INTERNAL FIXATION (ORIF) CLAVICULAR FRACTURE;  Surgeon: Shona Needles, MD;  Location: [place];  Service: Orthopedics;  Laterality: Right;   Family History:  Family History  Problem Relation Age of Onset  . Alcohol abuse Other    Family Psychiatric  History: Maternal grandfather-alcoholism  Social History:  Social History   Substance and Sexual Activity  Alcohol Use Yes  Comment: fifth of liquor     Social History   Substance and Sexual Activity  Drug Use Not Currently  . Types: Heroin    Social History   Socioeconomic History  . Marital status: Single    Spouse name: Sheila Graves  . Number of children: Not on file  . Years of education: Not on file  .  Highest education level: Not on file  Occupational History  . Occupation: Unemployed  Social Needs  . Financial resource strain: Not on file  . Food insecurity    Worry: Not on file    Inability: Not on file  . Transportation needs    Medical: Not on file    Non-medical: Not on file  Tobacco Use  . Smoking status: Current Every Day Smoker    Packs/day: 1.00  . Smokeless tobacco: Never Used  Substance and Sexual Activity  . Alcohol use: Yes    Comment: fifth of liquor  . Drug use: Not Currently    Types: Heroin  . Sexual activity: Yes    Birth control/protection: None  Lifestyle  . Physical activity    Days per week: Not on file    Minutes per session: Not on file  . Stress: Not on file  Relationships  . Social Musician on phone: Not on file    Gets together: Not on file    Attends religious service: Not on file    Active member of club or organization: Not on file    Attends meetings of clubs or organizations: Not on file    Relationship status: Not on file  Other Topics Concern  . Not on file  Social History Narrative   Pt lives in [place] with [name] Graves and mother in law   Additional Social History: She lives with her wife and wife's mother. She has been married for 2 months and together for less than a year. She does not have children. She is unemployed.     Allergies:  No Known Allergies  Labs:  Results for orders placed or performed during the hospital encounter of 03/15/19 (from the past 48 hour(s))  CBC     Status: Abnormal   Collection Time: 03/19/19  7:29 AM  Result Value Ref Range   WBC 3.9 (L) 4.0 - 10.5 K/uL   RBC 2.83 (L) 3.87 - 5.11 MIL/uL   Hemoglobin 9.4 (L) 12.0 - 15.0 g/dL   HCT 19.1 (L) 66.0 - 60.0 %   MCV 100.4 (H) 80.0 - 100.0 fL   MCH 33.2 26.0 - 34.0 pg   MCHC 33.1 30.0 - 36.0 g/dL   RDW 45.9 97.7 - 41.4 %   Platelets 88 (L) 150 - 400 K/uL    Comment: PLATELET COUNT CONFIRMED BY SMEAR SPECIMEN CHECKED FOR  CLOTS Immature Platelet Fraction may be clinically indicated, consider ordering this additional test [id number]    nRBC 0.0 0.0 - 0.2 %    Comment: Performed at [facility], [address], [place], [place] [address]  Comprehensive metabolic panel     Status: Abnormal   Collection Time: 03/19/19  7:29 AM  Result Value Ref Range   Sodium 142 135 - 145 mmol/L   Potassium 3.4 (L) 3.5 - 5.1 mmol/L   Chloride 105 98 - 111 mmol/L   CO2 29 22 - 32 mmol/L   Glucose, Bld 87 70 - 99 mg/dL   BUN 8 6 - 20 mg/dL   Creatinine, Ser 6.86 0.44 -  1.00 mg/dL   Calcium 8.2 (L) 8.9 - 10.3 mg/dL   Total Protein 4.9 (L) 6.5 - 8.1 g/dL   Albumin 2.7 (L) 3.5 - 5.0 g/dL   AST 76 (H) 15 - 41 U/L   ALT 41 0 - 44 U/L   Alkaline Phosphatase 62 38 - 126 U/L   Total Bilirubin 0.9 0.3 - 1.2 mg/dL   GFR calc non Af Amer >60 >60 mL/min   GFR calc Af Amer >60 >60 mL/min   Anion gap 8 5 - 15    Comment: Performed at [facility], [address] [place], [place] [address]  Magnesium     Status: Abnormal   Collection Time: 03/19/19  7:29 AM  Result Value Ref Range   Magnesium 1.6 (L) 1.7 - 2.4 mg/dL    Comment: Performed at [facility], [address] [place], [place] [address]  CK     Status: Abnormal   Collection Time: 03/19/19  7:29 AM  Result Value Ref Range   Total CK 1,017 (H) 38 - 234 U/L    Comment: Performed at [facility], [address] [place], [place] [address]  Phosphorus     Status: Abnormal   Collection Time: 03/19/19  7:29 AM  Result Value Ref Range   Phosphorus 1.9 (L) 2.5 - 4.6 mg/dL    Comment: Performed at [facility], [address] [place], [place] [address]  CBC     Status: Abnormal   Collection Time: 03/20/19  5:12 AM  Result Value Ref Range   WBC 3.4 (L) 4.0 - 10.5 K/uL   RBC 2.27 (L) 3.87 - 5.11 MIL/uL   Hemoglobin 7.6 (L) 12.0 - 15.0 g/dL   HCT 29.522.7 (L) 62.136.0 - 30.846.0 %   MCV  100.0 80.0 - 100.0 fL   MCH 33.5 26.0 - 34.0 pg   MCHC 33.5 30.0 - 36.0 g/dL   RDW 65.713.2 84.611.5 - 96.215.5 %   Platelets 95 (L) 150 - 400 K/uL    Comment: Immature Platelet Fraction may be clinically indicated, consider ordering this additional test [id number] CONSISTENT WITH PREVIOUS RESULT    nRBC 0.0 0.0 - 0.2 %    Comment: Performed at [facility], [address] [place], [place] [address]  Comprehensive metabolic panel     Status: Abnormal   Collection Time: 03/20/19  5:12 AM  Result Value Ref Range   Sodium 135 135 - 145 mmol/L    Comment: DELTA CHECK NOTED REPEATED TO VERIFY    Potassium 2.9 (L) 3.5 - 5.1 mmol/L   Chloride 105 98 - 111 mmol/L   CO2 21 (L) 22 - 32 mmol/L   Glucose, Bld 84 70 - 99 mg/dL   BUN <5 (L) 6 - 20 mg/dL   Creatinine, Ser 2.720.53 0.44 - 1.00 mg/dL   Calcium 7.3 (L) 8.9 - 10.3 mg/dL   Total Protein 4.4 (L) 6.5 - 8.1 g/dL   Albumin 2.4 (L) 3.5 - 5.0 g/dL   AST 61 (H) 15 - 41 U/L   ALT 34 0 - 44 U/L   Alkaline Phosphatase 51 38 - 126 U/L   Total Bilirubin 0.4 0.3 - 1.2 mg/dL   GFR calc non Af Amer >60 >60 mL/min   GFR calc Af Amer >60 >60 mL/min   Anion gap 9 5 - 15    Comment: Performed at [facility], [address] [place], [place] [address]  Magnesium     Status: Abnormal   Collection Time: 03/20/19  5:12 AM  Result Value Ref Range   Magnesium 1.4 (L) 1.7 - 2.4 mg/dL    Comment: Performed at [facility], [address], [place], [place] [address]  Phosphorus     Status: None   Collection Time: 03/20/19  5:12 AM  Result Value Ref Range   Phosphorus 2.7 2.5 - 4.6 mg/dL    Comment: Performed at [facility], [address] [place], [place] [address]  CK     Status: Abnormal   Collection Time: 03/20/19  5:12 AM  Result Value Ref Range   Total CK 755 (H) 38 - 234 U/L    Comment: Performed at [facility], [address], [place], [place] [address]     Medications:  Current Facility-Administered Medications  Medication Dose Route Frequency Provider Last Rate Last Dose  . 0.9 %  sodium chloride infusion   Intravenous PRN Dhungel, Nishant, MD   Stopped at 03/16/19 1934  . acetaminophen (TYLENOL) tablet 650 mg  650 mg Oral Q6H PRN Dhungel, Nishant, MD   650 mg at 03/20/19 0122  . chlordiazePOXIDE (LIBRIUM) capsule 25 mg  25 mg Oral QID Icard, Bradley L, DO   25 mg at 03/20/19 0939  . chlorhexidine (PERIDEX) 0.12 % solution 15 mL  15 mL Mouth Rinse BID Dhungel, Nishant, MD   15 mL at 03/20/19 0939  . Chlorhexidine Gluconate Cloth 2 % PADS 6 each  6 each Topical Daily Therisa Doyne, MD   6 each at 03/20/19 1022  . dexmedetomidine (PRECEDEX) 200 MCG/50ML (4 mcg/mL) infusion  0.4-1.2 mcg/kg/hr Intravenous Titrated Dhungel, Nishant, MD   Stopped at 03/19/19 1056  . folic acid injection 1 mg  1 mg Intravenous Daily Dhungel, Nishant, MD   1 mg at 03/20/19 0939  . MEDLINE mouth rinse  15 mL Mouth Rinse q12n4p Dhungel, Nishant, MD   15 mL at 03/20/19 1249  . multivitamin with minerals tablet 1 tablet  1 tablet Oral Daily Therisa Doyne, MD   1 tablet at 03/20/19 0939  . mupirocin ointment (BACTROBAN) 2 % 1 application  1 application Nasal BID Dhungel, Nishant, MD   1 application at 03/20/19 0942  . ondansetron (ZOFRAN) injection 4 mg  4 mg Intravenous Q6H PRN Icard, Bradley L, DO   4 mg at 03/16/19 1014  . pantoprazole (PROTONIX) injection 40 mg  40 mg Intravenous Q12H Simonne Martinet, NP   40 mg at 03/20/19 0939  . sodium chloride flush (NS) 0.9 % injection 10-40 mL  10-40 mL Intracatheter Q12H Dhungel, Nishant, MD   20 mL at 03/20/19 0941  . sodium chloride flush (NS) 0.9 % injection 10-40 mL  10-40 mL Intracatheter PRN Dhungel, Nishant, MD      . thiamine (B-1) injection 100 mg  100 mg Intravenous Daily Doutova, Anastassia, MD   100 mg at 03/19/19 0957  . thiamine (VITAMIN B-1) tablet 100 mg  100 mg Oral Daily Doutova, Anastassia, MD    100 mg at 03/20/19 0941   Or  . thiamine (B-1) injection 100 mg  100 mg Intravenous Daily Therisa Doyne, MD   100 mg at 03/18/19 0915    Musculoskeletal: Strength & Muscle Tone: No atrophy noted. Gait & Station: UTA since patient is lying in bed. Patient leans: N/A  Psychiatric Specialty Exam: Physical Exam  Nursing note and vitals reviewed. Constitutional: She is oriented to person, place, and  time. She appears well-developed and well-nourished.  HENT:  Head: Normocephalic and atraumatic.  Neck: Normal range of motion.  Respiratory: Effort normal.  Musculoskeletal: Normal range of motion.  Neurological: She is alert and oriented to person, place, and time.  Psychiatric: Her speech is normal and behavior is normal. Judgment and thought content normal. Cognition and memory are normal. She exhibits a depressed mood.    Review of Systems  Gastrointestinal: Negative for constipation, diarrhea, nausea and vomiting.  Psychiatric/Behavioral: Positive for depression and substance abuse. Negative for hallucinations and suicidal ideas. The patient has insomnia (chronic).   All other systems reviewed and are negative.   Blood pressure (!) 136/99, pulse 82, temperature 98.6 F (37 C), temperature source Oral, resp. rate (!) 21, height 5\' 10"  (1.778 m), weight 65.6 kg, SpO2 100 %.Body mass index is 20.75 kg/m.  General Appearance: Fairly Groomed, young, Caucasian female, wearing a hospital gown who is lying in bed. NAD.   Eye Contact:  Good  Speech:  Clear and Coherent and Normal Rate  Volume:  Normal  Mood:  Depressed  Affect:  Constricted  Thought Process:  Goal Directed, Linear and Descriptions of Associations: Intact  Orientation:  Full (Time, Place, and Person)  Thought Content:  Logical  Suicidal Thoughts:  No  Homicidal Thoughts:  No  Memory:  Immediate;   Good Recent;   Good Remote;   Good  Judgement:  Fair  Insight:  Fair  Psychomotor Activity:  Normal  Concentration:   Concentration: Good and Attention Span: Good  Recall:  Good  Fund of Knowledge:  Good  Language:  Good  Akathisia:  No  Handed:  Right  AIMS (if indicated):   N/A  Assets:  Communication Skills Desire for Improvement Housing Intimacy Resilience Social Support  ADL's:  Intact  Cognition:  WNL  Sleep:   Poor   Assessment:  Sheila Graves is a 38 y.o. female who was admitted with sepsis, noninfectious with multiorgan failure likely due to alcoholic hepatitis with severe metabolic acidosis and ketosis. Her hospital course was complicated by acute rhabdomyolysis, acute tubular necrosis, alcohol intoxication with withdrawal and acute metabolic encephalopathy. Patient endorses a chronic history of alcohol abuse and depression. She endorses poor sleep. She denies SI, HI or AVH. She does not appear to be responding to internal stimuli. Patient's mother is at bedside with her consent and denies concerns for her safety. Recommend Lexapro for depressive symptoms. Please have SW provide patient with substance abuse treatment resources including dual diagnosis treatment facilities.       Treatment Plan Summary: -Start Lexapro 10 mg daily for mood.  -EKG reviewed and QTc 466. Please closely monitor when starting or increasing QTc prolonging agents.   -Please have SW provide patient with resources for substance abuse treatment including dual diagnosis treatment facilities.   Disposition: No evidence of imminent risk to self or others at present.   Patient does not meet criteria for psychiatric inpatient admission.  This service was provided via telemedicine using a 2-way, interactive audio and video technology.  Names of all persons participating in this telemedicine service and their role in this encounter. Name: Juanetta Beets, DO Role: Psychiatrist   Name: Ames Dura Role: Patient     Cherly Beach, DO 03/20/2019 12:59 PM

## 2019-03-20 NOTE — Progress Notes (Signed)
Pt potassium on morning labs resulted at 2.9, technically not a critical value but this RN paged the midlevel to make them aware. Awaiting replacement orders.

## 2019-03-20 NOTE — TOC Initial Note (Signed)
Transition of Care [facility]) - Initial/Assessment Note    Patient Details  Name: Sheila Graves MRN: [id number] Date of Birth: 1981/06/07  [facility] CM/SW Contact:    Nila Nephew, LCSW Phone Number: [phone] 03/20/2019, 4:10 PM  Clinical Narrative:    Pt admitted with acute metabolic encephalopathy, alcohol withdrawal.  Lives at home with wife however reports was staying in hotel pta, reports relational stressors and alcohol addiction current stressors leading to her drinking half gallon vodka and found unresponsive in hotel on 03/14/19. States she was having suicidal thoughts but it was not suicide attempt.   Reports hx of MDD- was admitted at [facility] 01/2018 for SI/detox, has been to [place], [place], and [facility] residential treatment programs.  Pt started drinking 11 years ago and reports longest period sober in 2018- 44 days following a treatment program. She reports that programs have been helpful but she is looking for longer than 30 day program in order to have more time to stabilize depressive symptoms and feel confident in coping mechanisms before returning to environment containing triggers. Feels MDD contributing to ongoing alcoholism.  Reports psychiatry consult today recommended starting Lexapro and she is agreeable to this.   Pt's mother was present for assessment at pt's request to involve parents. Reports parents highly supportive. Has been married to wifex 2 months- reports wife can be communicated with but in the event she is unable to make medical decisions for herself, would want parents to be decision-makers. CSW informed her about establishing HCPOA since legally, spouse is first turned to for decision making then parents. Patient planning to follow up on filing HCPOA paperwork.  Discussed limited program options for alcohol treatment 30 days +, especially as pt currently uninsured. Pt's mother states parents able to somewhat support financially for treatment  depending on cost. Pt requested referral to [facility] CSW contacted admissions who sent referral packet- filled out with pt and her mother and faxed to [phone]. Admissions states can take up to 48 hours for review and response. If pt discharges prior to that time, can send DC medication list and information and depending on barriers, pt may be able to admit from home.   Provided contact information to other women's alcohol treatment programs in [place] should pt need other options to look into post DC. Pt agreeable.  Will follow up after response from [facility] referral.          Expected Discharge Plan: Home/Self Care Barriers to Discharge: Continued Medical Work up   Patient Goals and CMS Choice Patient states their goals for this hospitalization and ongoing recovery are:: to get depression and alcoholism treatment      Expected Discharge Plan and Services Expected Discharge Plan: Home/Self Care In-house Referral: Clinical Social Work(psychiatry)     Living arrangements for the past 2 months: Single Family Home Expected Discharge Date: (unknown)                                    Prior Living Arrangements/Services Living arrangements for the past 2 months: Single Family Home Lives with:: Spouse Patient language and need for interpreter reviewed:: No Do you feel safe going back to the place where you live?: Yes      Need for Family Participation in Patient Care: Yes (Comment)(involving parents) Care giver support system in place?: Yes (comment)   Criminal Activity/Legal Involvement Pertinent to Current  Situation/Hospitalization: Yes - Comment as needed(pending DUI court dates 04/20/19, 05/01/19, 06/13/19)  Activities of Daily Living Home Assistive Devices/Equipment: None ADL Screening (condition at time of admission) Patient's cognitive ability adequate to safely complete daily activities?: No Is the patient deaf or have difficulty  hearing?: No Does the patient have difficulty seeing, even when wearing glasses/contacts?: No Does the patient have difficulty concentrating, remembering, or making decisions?: Yes Patient able to express need for assistance with ADLs?: Yes Does the patient have difficulty dressing or bathing?: Yes Independently performs ADLs?: No Communication: Independent Dressing (OT): Needs assistance Is this a change from baseline?: Change from baseline, expected to last >3 days Grooming: Needs assistance Is this a change from baseline?: Change from baseline, expected to last >3 days Feeding: Needs assistance Is this a change from baseline?: Change from baseline, expected to last >3 days Bathing: Needs assistance Is this a change from baseline?: Change from baseline, expected to last >3 days Toileting: Needs assistance Is this a change from baseline?: Change from baseline, expected to last >3days In/Out Bed: Needs assistance Is this a change from baseline?: Change from baseline, expected to last >3 days Walks in Home: Needs assistance Is this a change from baseline?: Change from baseline, expected to last >3 days Does the patient have difficulty walking or climbing stairs?: Yes Weakness of Legs: Both Weakness of Arms/Hands: None  Permission Sought/Granted Permission sought to share information with : Facility Medical sales representativeContact Representative, Family Supports Permission granted to share information with : Yes, Verbal Permission Granted, Yes, Release of Information Signed  Share Information with NAME: [phone] parents Dois DavenportSandra and Ardelle BallsRicky Modesitt, [phone] wife [name]  Permission granted to share info w AGENCY: [facility] [phone]        Emotional Assessment Appearance:: Appears stated age Attitude/Demeanor/Rapport: Engaged, Gracious Affect (typically observed): Accepting, Adaptable Orientation: : Oriented to Self, Oriented to Place, Oriented to  Time, Oriented to  Situation Alcohol / Substance Use: Alcohol Use, Tobacco Use Psych Involvement: Yes (comment)(psychiatry consult completed)  Admission diagnosis:  Dehydration [E86.0] Somnolence [R40.0] AKI (acute kidney injury) (HCC) [N17.9] Alcoholic intoxication with complication [facility](HCC) [F10.929] Patient Active Problem List   Diagnosis Date Noted  . Alcohol use disorder, severe, dependence (HCC)   . Prolonged QT interval 03/16/2019  . Metabolic acidosis 03/16/2019  . AKI (acute kidney injury) (HCC)   . Transaminitis   . Dehydration 03/15/2019  . Alcohol intoxication (HCC) 02/04/2019  . Hypokalemia 02/04/2019  . Hypothermia 02/04/2019  . Hypotension 02/04/2019  . Fracture of clavicle, right, open 11/11/2018  . Alcohol use disorder, moderate, dependence (HCC) 01/31/2018  . Major depressive disorder, recurrent, severe without psychotic features (HCC) 01/31/2018   PCP:  Patient, No Pcp Per Pharmacy:   [id number]- [place], [place] - [address] [place] [address] Phone: [phone] Fax: [phone]     Social Determinants of Health (SDOH) Interventions    Readmission Risk Interventions No flowsheet data found.

## 2019-03-20 NOTE — Progress Notes (Addendum)
PROGRESS NOTE                                                                                                                                                                                                             Patient Demographics:    Sheila Graves, is a 38 y.o. female, DOB - [birth year], ZOX:[id number]  Admit date - 03/15/2019   Admitting Physician Therisa Doyne, MD  Outpatient Primary MD for the patient is Patient, No Pcp Per  LOS - 4  Outpatient Specialists: none  Chief Complaint  Patient presents with   Hypotension       Brief Narrative   38 year old female with alcohol abuse and history of suicidal ideations, prior history of IV drug use was found in a hotel unresponsive.  EMS found half a gallon of vodka at the scene.  She was hypotensive with blood pressure 80/60 mmHg and CBG of 78.  1 month back she was brought to the ED by EMS after found unresponsive with significant alcohol level and early withdrawal but signed out AMA the same night. Patient is confused and provides limited history.  Says she only drank vodka and no methanol, antifreeze or any rubbing alcohol or mouthwash.  Patient found to be acute severe metabolic acidosis with anion gap in the 30s, severe lactic acidosis in the 10s, ATN, transaminitis and CPK of 11,000.  Alcohol level of 450s.   Subjective:   Has not required Ativan and is well alert and oriented, remains calm.  Denies further abdominal pain and tolerating regular diet.  Wants to see the psychiatrist and social work and have options on alcohol rehab.  Assessment  & Plan :    Active Problems: Sepsis, noninfectious with multiorgan failure Secondary to alcoholic hepatitis + severe metabolic acidosis and ketosis. Negative for other volatile's (ethylene glycol, methanol and isopropyl alcohol) Now resolved. Appreciate PCCM and renal consult.   Acute symptoms  Alcohol  withdrawal with acute delirium/acute metabolic encephalopathy Significantly improving with Precedex drip.  No further withdrawal symptoms.  Acute rhabdomyolysis CPK 11k on presentation, now improved to <700 today.   Acute tubular necrosis Prerenal (Fena 0.3) ATN.  Resolved with aggressive hydration.  Renal consult appreciated..   Anemia/thrombocytopenia Noted for significant drop in H&H (hemoglobin of 7.6 from 14 on admission).  Reportedly also had coffee-ground emesis x1.  On  IV Protonix twice daily.  Check stool for Hemoccult, iron panel and B12.  Will consult GI if further drop in H&H. Platelets improving  transaminitis Secondary to alcoholic hepatitis.  Did not require steroid.  Improving.  Major depressive disorder with psychotic behavior Has been hospitalized to [facility] in the past.  As per wife she has ongoing issue with drinking and multiple ED visits.  Patient has expressed being suicidal and threatening the wife multiple times. Patient wishes to check into alcohol rehab.  Says being in behavioral health previously has helped.  Psych consulted but given her encephalopathy asked to consult later.  Psych consulted again.  ?UTI Received 3 days of Rocephin.  Hypocalcemia Replenished.  Hypokalemia Replenished  Hypophosphatemia Replenished with potassium phosphate.  Now improving.  High risk for going to refeeding.  Monitor closely.  Prolonged QTC Resolved currently  Code Status : full code  Family Communication  : Updated mother at bedside on 9/20.  Disposition Plan  : pending psych consult.  Stable for transfer to medical floor.  Should be stable for discharge medically either home or [facility] tomorrow.  Barriers For Discharge : active symptoms  Consults  :  PCCM/ renal  Procedures  : [place] abd, PICC line  DVT Prophylaxis  : SCDs  Lab Results  Component Value Date   PLT 95 (L) 03/20/2019    Antibiotics  :    Anti-infectives (From admission, onward)   Start      Dose/Rate Route Frequency Ordered Stop   03/16/19 0415  cefTRIAXone (ROCEPHIN) 1 g in sodium chloride 0.9 % 100 mL IVPB  Status:  Discontinued     1 g 200 mL/hr over 30 Minutes Intravenous Daily 03/16/19 0403 03/18/19 0846        Objective:   Vitals:   03/20/19 0100 03/20/19 0200 03/20/19 0400 03/20/19 0800  BP: 130/85 113/78 110/76 (!) 136/99  Pulse: 90 93 90 82  Resp: 17 (!) 8 (!) 28 (!) 21  Temp:    98.6 F (37 C)  TempSrc:    Oral  SpO2: 98% 95% 97% 100%  Weight:      Height:        Wt Readings from Last 3 Encounters:  03/17/19 65.6 kg  01/18/19 (S) 61 kg  09/14/18 61.2 kg     Intake/Output Summary (Last 24 hours) at 03/20/2019 1022 Last data filed at 03/20/2019 0939 Gross per 24 hour  Intake 2516.65 ml  Output 4200 ml  Net -1683.35 ml   Physical exam Not in distress HEENT: Moist mucosa, supple neck Chest: Clear CVs: Normal S1-S2 GI: Soft, nondistended, nontender Musculoskeletal: Warm, no edema CNS: Alert and oriented, no tremors     Data Review:    CBC Recent Labs  Lab 03/15/19 1600 03/16/19 0630 03/17/19 0500 03/18/19 0500 03/19/19 0729 03/20/19 0512  WBC 18.2* 13.2* 4.8 4.2 3.9* 3.4*  HGB 14.3 11.3* 8.5* 8.8* 9.4* 7.6*  HCT 45.1 35.0* 25.6* 25.8* 28.4* 22.7*  PLT 302 172 122* 94* 88* 95*  MCV 104.2* 103.6* 98.1 99.6 100.4* 100.0  MCH 33.0 33.4 32.6 34.0 33.2 33.5  MCHC 31.7 32.3 33.2 34.1 33.1 33.5  RDW 13.7 14.1 14.2 13.8 13.2 13.2  LYMPHSABS 2.4  --   --   --   --   --   MONOABS 1.1*  --   --   --   --   --   EOSABS 0.0  --   --   --   --   --  BASOSABS 0.0  --   --   --   --   --     Chemistries  Recent Labs  Lab 03/16/19 0630  03/17/19 1104 03/18/19 0015 03/18/19 0500 03/19/19 0729 03/20/19 0512  NA 149*   < > 140 138 140 142 135  K 4.4   < > 3.2* 3.2* 3.4* 3.4* 2.9*  CL 109   < > 104 100 102 105 105  CO2 7*   < > 25 27 28 29  21*  GLUCOSE 89   < > 132* 116* 100* 87 84  BUN 43*   < > 23* 14 8 8  <5*  CREATININE 1.88*   <  > 1.03* 0.64 0.53 0.60 0.53  CALCIUM 6.8*   < > 7.2* 7.5* 7.6* 8.2* 7.3*  MG 2.1  --  1.8  --  1.9 1.6* 1.4*  AST 249*  --  201*  --  138* 76* 61*  ALT 68*  --  58*  --  50* 41 34  ALKPHOS 83  --  55  --  55 62 51  BILITOT 0.5  --  0.8  --  0.9 0.9 0.4   < > = values in this interval not displayed.   ------------------------------------------------------------------------------------------------------------------ No results for input(s): CHOL, HDL, LDLCALC, TRIG, CHOLHDL, LDLDIRECT in the last 72 hours.  No results found for: HGBA1C ------------------------------------------------------------------------------------------------------------------ No results for input(s): TSH, T4TOTAL, T3FREE, THYROIDAB in the last 72 hours.  Invalid input(s): FREET3 ------------------------------------------------------------------------------------------------------------------ No results for input(s): VITAMINB12, FOLATE, FERRITIN, TIBC, IRON, RETICCTPCT in the last 72 hours.  Coagulation profile Recent Labs  Lab 03/15/19 2327  INR 1.0    No results for input(s): DDIMER in the last 72 hours.  Cardiac Enzymes No results for input(s): CKMB, TROPONINI, MYOGLOBIN in the last 168 hours.  Invalid input(s): CK ------------------------------------------------------------------------------------------------------------------ No results found for: BNP  Inpatient Medications  Scheduled Meds:  chlordiazePOXIDE  25 mg Oral QID   chlorhexidine  15 mL Mouth Rinse BID   Chlorhexidine Gluconate Cloth  6 each Topical Daily   folic acid  1 mg Intravenous Daily   mouth rinse  15 mL Mouth Rinse q12n4p   multivitamin with minerals  1 tablet Oral Daily   mupirocin ointment  1 application Nasal BID   pantoprazole (PROTONIX) IV  40 mg Intravenous Q12H   potassium chloride  40 mEq Oral Q4H   sodium chloride flush  10-40 mL Intracatheter Q12H   thiamine injection  100 mg Intravenous Daily    thiamine  100 mg Oral Daily   Or   thiamine  100 mg Intravenous Daily   Continuous Infusions:  sodium chloride Stopped (03/16/19 1934)   dexmedetomidine (PRECEDEX) IV infusion Stopped (03/19/19 1056)   magnesium sulfate bolus IVPB 4 g (03/20/19 0952)   PRN Meds:.sodium chloride, acetaminophen, ondansetron (ZOFRAN) IV, sodium chloride flush  Micro Results Recent Results (from the past 240 hour(s))  Urine culture     Status: None   Collection Time: 03/15/19  4:10 PM   Specimen: Urine, Random  Result Value Ref Range Status   Specimen Description   Final    URINE, RANDOM Performed at [facility], [address] [place], [place] [address]    Special Requests   Final    NONE Performed at [facility], [address] [place], [place] [address]    Culture   Final    NO GROWTH Performed at [facility], [address] [place],  [place] [address]    Report Status 03/16/2019 FINAL  Final  Blood culture (routine x 2)     Status: None   Collection Time: 03/15/19  5:02 PM   Specimen: BLOOD LEFT HAND  Result Value Ref Range Status   Specimen Description   Final    BLOOD LEFT HAND Performed at [facility], [address] [place], [place] [address]    Special Requests   Final    BOTTLES DRAWN AEROBIC AND ANAEROBIC Blood Culture results may not be optimal due to an inadequate volume of blood received in culture bottles Performed at [facility], [address] [place], [place] [address]    Culture   Final    NO GROWTH 5 DAYS Performed at [facility], [address] [place], [place] [address]    Report Status 03/20/2019 FINAL  Final  SARS CORONAVIRUS 2 (TAT 6-24 HRS) Nasopharyngeal Nasopharyngeal Swab     Status: None   Collection Time: 03/15/19 10:47 PM   Specimen: Nasopharyngeal Swab  Result Value Ref Range Status   SARS Coronavirus 2 NEGATIVE NEGATIVE Final    Comment:  (NOTE) SARS-CoV-2 target nucleic acids are NOT DETECTED. The SARS-CoV-2 RNA is generally detectable in upper and lower respiratory specimens during the acute phase of infection. Negative results do not preclude SARS-CoV-2 infection, do not rule out co-infections with other pathogens, and should not be used as the sole basis for treatment or other patient management decisions. Negative results must be combined with clinical observations, patient history, and epidemiological information. The expected result is Negative. Fact Sheet for Patients: HairSlick.no Fact Sheet for Healthcare Providers: quierodirigir.com This test is not yet approved or cleared by the [place] FDA and  has been authorized for detection and/or diagnosis of SARS-CoV-2 by FDA under an Emergency Use Authorization (EUA). This EUA will remain  in effect (meaning this test can be used) for the duration of the COVID-19 declaration under Section 56 4(b)(1) of the Act, 21 U.S.C. section 360bbb-3(b)(1), unless the authorization is terminated or revoked sooner. Performed at [facility], [address], [place], [place] [address]   MRSA PCR Screening     Status: Abnormal   Collection Time: 03/16/19 11:57 PM   Specimen: Nasal Mucosa; Nasopharyngeal  Result Value Ref Range Status   MRSA by PCR POSITIVE (A) NEGATIVE Final    Comment:        The GeneXpert MRSA Assay (FDA approved for NASAL specimens only), is one component of a comprehensive MRSA colonization surveillance program. It is not intended to diagnose MRSA infection nor to guide or monitor treatment for MRSA infections. RESULT CALLED TO, READ BACK BY AND VERIFIED WITH: LANI RN AT 1552 ON 03/17/2019 BY S.VANHOORNE Performed at [facility], [address] [place], [place] [address]     Radiology Reports Ct Head Wo Contrast  Result Date: 03/15/2019 CLINICAL DATA:  Altered  mental status. Suspected ethanol intoxication EXAM: CT HEAD WITHOUT CONTRAST TECHNIQUE: Contiguous axial images were obtained from the base of the skull through the vertex without intravenous contrast. COMPARISON:  February 21, 2019 FINDINGS: Brain: The ventricles are normal in size and configuration. There is no intracranial mass, hemorrhage, extra-axial fluid collection, or midline shift. The brain parenchyma appears unremarkable. No acute infarct evident. Vascular: There is no hyperdense vessel. There is no evident vascular calcification. Skull: The bony calvarium appears intact. Sinuses/Orbits: There is mucosal thickening and opacification in multiple ethmoid air cells. Other  paranasal sinuses are clear. Orbits appear symmetric bilaterally. Other: Mastoid air cells are clear. IMPRESSION: Areas of ethmoid sinus disease.  Study otherwise unremarkable. Electronically Signed   By: Bretta Bang III M.D.   On: 03/15/2019 21:45   Ct Head Wo Contrast  Result Date: 02/21/2019 CLINICAL DATA:  Unresponsive EXAM: CT HEAD WITHOUT CONTRAST CT CERVICAL SPINE WITHOUT CONTRAST TECHNIQUE: Multidetector CT imaging of the head and cervical spine was performed following the standard protocol without intravenous contrast. Multiplanar CT image reconstructions of the cervical spine were also generated. COMPARISON:  CT 02/04/2019, 01/23/2019 FINDINGS: CT HEAD FINDINGS Brain: No evidence of acute infarction, hemorrhage, hydrocephalus, extra-axial collection or mass lesion/mass effect. Vascular: No hyperdense vessel or unexpected calcification. Skull: Normal. Negative for fracture or focal lesion. Sinuses/Orbits: Mucosal thickening in the ethmoid sinuses Other: None CT CERVICAL SPINE FINDINGS Alignment: Unable to adequately evaluate C1-C2 articulation due to marked degree of head rotation. Remainder of the cervical alignment is within normal limits. Facet alignment is within normal limits. Skull base and vertebrae: No acute  fracture. No primary bone lesion or focal pathologic process. Soft tissues and spinal canal: No prevertebral fluid or swelling. No visible canal hematoma. Disc levels: Mild posterior spurring at C3-C4. Disc spaces are maintained. Upper chest: Negative. Other: None IMPRESSION: 1. Negative non contrasted CT appearance of the brain. 2. No obvious fracture. Unable to adequately evaluate C1-C2 articulation, secondary to severe degree of head rotation and limited utility of the coronal and sagittal views of this region. Rotary subluxation is unable to be excluded on the current images. Electronically Signed   By: Jasmine Pang M.D.   On: 02/21/2019 22:09   Ct Cervical Spine Wo Contrast  Result Date: 02/21/2019 CLINICAL DATA:  Unresponsive EXAM: CT HEAD WITHOUT CONTRAST CT CERVICAL SPINE WITHOUT CONTRAST TECHNIQUE: Multidetector CT imaging of the head and cervical spine was performed following the standard protocol without intravenous contrast. Multiplanar CT image reconstructions of the cervical spine were also generated. COMPARISON:  CT 02/04/2019, 01/23/2019 FINDINGS: CT HEAD FINDINGS Brain: No evidence of acute infarction, hemorrhage, hydrocephalus, extra-axial collection or mass lesion/mass effect. Vascular: No hyperdense vessel or unexpected calcification. Skull: Normal. Negative for fracture or focal lesion. Sinuses/Orbits: Mucosal thickening in the ethmoid sinuses Other: None CT CERVICAL SPINE FINDINGS Alignment: Unable to adequately evaluate C1-C2 articulation due to marked degree of head rotation. Remainder of the cervical alignment is within normal limits. Facet alignment is within normal limits. Skull base and vertebrae: No acute fracture. No primary bone lesion or focal pathologic process. Soft tissues and spinal canal: No prevertebral fluid or swelling. No visible canal hematoma. Disc levels: Mild posterior spurring at C3-C4. Disc spaces are maintained. Upper chest: Negative. Other: None IMPRESSION: 1.  Negative non contrasted CT appearance of the brain. 2. No obvious fracture. Unable to adequately evaluate C1-C2 articulation, secondary to severe degree of head rotation and limited utility of the coronal and sagittal views of this region. Rotary subluxation is unable to be excluded on the current images. Electronically Signed   By: Jasmine Pang M.D.   On: 02/21/2019 22:09   US Abdomen Complete  Result Date: 03/16/2019 CLINICAL DATA:  Transaminitis. EXAM: ABDOMEN ULTRASOUND COMPLETE COMPARISON:  Ultrasound of February 27, 2017. FINDINGS: Gallbladder: No gallstones or wall thickening visualized. No sonographic Murphy sign noted by sonographer. Common bile duct: Diameter: 3 mm which is within normal limits. Liver: No focal lesion identified. Within normal limits in parenchymal echogenicity. Portal vein is patent on color Doppler imaging with normal direction  of blood flow towards the liver. IVC: No abnormality visualized. Pancreas: Visualized portion unremarkable. Spleen: Size and appearance within normal limits. Right Kidney: Length: 10.4 cm. Echogenicity within normal limits. No mass or hydronephrosis visualized. Left Kidney: Length: 10.2 cm. Echogenicity within normal limits. No mass or hydronephrosis visualized. Abdominal aorta: No aneurysm visualized. Other findings: Large anechoic abnormality is noted in left upper quadrant which contains multiple internal echoes most consistent with dilated stomach. IMPRESSION: Exam is somewhat limited as patient could not hold still. Large anechoic abnormality with internal echoes is noted in left upper quadrant which most likely represents significantly distended stomach. No other abnormality seen in the abdomen. Electronically Signed   By: Lupita Raider M.D.   On: 03/16/2019 09:37   Dg Chest Portable 1 View  Result Date: 03/15/2019 CLINICAL DATA:  Altered mental status.  Hypotension. EXAM: PORTABLE CHEST 1 VIEW COMPARISON:  February 03, 2019 FINDINGS: There is no  edema or consolidation. Heart size and pulmonary vascularity are normal. No adenopathy. There is postop change in the right clavicle. IMPRESSION: No edema or consolidation. Electronically Signed   By: Bretta Bang III M.D.   On: 03/15/2019 16:32   [place] Ekg Site Rite  Result Date: 03/16/2019 If Site Rite image not attached, placement could not be confirmed due to current cardiac rhythm.   Time Spent in minutes  35   Jamarie Mussa M.D on 03/20/2019 at 10:22 AM  Between 7am to 7pm - Pager - [phone]  After 7pm go to [email or web address] - password [facility]  [facility] -  Office  [phone]

## 2019-03-20 NOTE — Progress Notes (Signed)
Patient was transferred from ICU to 5 E. at 1247. Alert and oriented x 4. Mother is at bedside. No Pain complained. Room is set up. telesitter was DC before patient transferred to [place].

## 2019-03-21 DIAGNOSIS — M6282 Rhabdomyolysis: Secondary | ICD-10-CM | POA: Diagnosis present

## 2019-03-21 DIAGNOSIS — F10239 Alcohol dependence with withdrawal, unspecified: Secondary | ICD-10-CM | POA: Diagnosis present

## 2019-03-21 DIAGNOSIS — F332 Major depressive disorder, recurrent severe without psychotic features: Secondary | ICD-10-CM

## 2019-03-21 DIAGNOSIS — G9341 Metabolic encephalopathy: Secondary | ICD-10-CM | POA: Diagnosis present

## 2019-03-21 DIAGNOSIS — F10939 Alcohol use, unspecified with withdrawal, unspecified: Secondary | ICD-10-CM | POA: Diagnosis present

## 2019-03-21 LAB — COMPREHENSIVE METABOLIC PANEL
ALT: 39 U/L (ref 0–44)
AST: 55 U/L — ABNORMAL HIGH (ref 15–41)
Albumin: 3.2 g/dL — ABNORMAL LOW (ref 3.5–5.0)
Alkaline Phosphatase: 69 U/L (ref 38–126)
Anion gap: 6 (ref 5–15)
BUN: 5 mg/dL — ABNORMAL LOW (ref 6–20)
CO2: 23 mmol/L (ref 22–32)
Calcium: 8.6 mg/dL — ABNORMAL LOW (ref 8.9–10.3)
Chloride: 108 mmol/L (ref 98–111)
Creatinine, Ser: 0.53 mg/dL (ref 0.44–1.00)
GFR calc Af Amer: >60 mL/min (ref >60)
GFR calc non Af Amer: >60 mL/min (ref >60)
Glucose, Bld: 103 mg/dL — ABNORMAL HIGH (ref 70–99)
Potassium: 3.8 mmol/L (ref 3.5–5.1)
Sodium: 137 mmol/L (ref 135–145)
Total Bilirubin: 0.4 mg/dL (ref 0.3–1.2)
Total Protein: 5.7 g/dL — ABNORMAL LOW (ref 6.5–8.1)

## 2019-03-21 LAB — CBC
HCT: 28.4 % — ABNORMAL LOW (ref 36.0–46.0)
Hemoglobin: 9.5 g/dL — ABNORMAL LOW (ref 12.0–15.0)
MCH: 33.3 pg (ref 26.0–34.0)
MCHC: 33.5 g/dL (ref 30.0–36.0)
MCV: 99.6 fL (ref 80.0–100.0)
Platelets: 143 10*3/uL — ABNORMAL LOW (ref 150–400)
RBC: 2.85 MIL/uL — ABNORMAL LOW (ref 3.87–5.11)
RDW: 13.4 % (ref 11.5–15.5)
WBC: 5.5 10*3/uL (ref 4.0–10.5)
nRBC: 0 % (ref 0.0–0.2)

## 2019-03-21 MED ORDER — ESCITALOPRAM OXALATE 10 MG PO TABS: 10.0000 mg | ORAL_TABLET | Freq: Every day | ORAL | 0 refills | Status: DC

## 2019-03-21 MED ORDER — CHLORDIAZEPOXIDE HCL 25 MG PO CAPS: 25.0000 mg | ORAL_CAPSULE | Freq: Three times a day (TID) | ORAL | 0 refills | Status: AC | PRN

## 2019-03-21 NOTE — Discharge Summary (Signed)
Physician Discharge Summary  Sheila Graves WUJ:[id number] DOB: December 04, 1980 DOA: 03/15/2019  PCP: Patient, No Pcp Per  Admit date: 03/15/2019 Discharge date: 03/21/2019  Admitted From: Home Disposition: Home  Recommendations for Outpatient Follow-up:  1. Referral made to inpatient alcohol rehab programs.  Home Health: None Equipment/Devices: None  Discharge Condition: Fair CODE STATUS full code Diet recommendation: Regular    Discharge Diagnoses:  Principal Problem:   Alcohol withdrawal syndrome with complication, with unspecified complication (HCC)   Active Problems: Sepsis, noninfectious with multiorgan failure Acute metabolic acidosis Acute tubular necrosis   Alcohol use disorder, severe, dependence (HCC)   Acute metabolic encephalopathy   Transaminitis   Rhabdomyolysis   Major depressive disorder, recurrent, severe without psychotic features (HCC)   Alcohol intoxication (HCC)   Hypokalemia   Hypotension   Dehydration   Prolonged QT interval  Brief narrative/HPI  38 year old female with alcohol abuse and history of suicidal ideations, prior history of IV drug use was found in a hotel unresponsive.  EMS found half a gallon of vodka at the scene.  She was hypotensive with blood pressure 80/60 mmHg and CBG of 78.  1 month back she was brought to the ED by EMS after found unresponsive with significant alcohol level and early withdrawal but signed out AMA the same night. Patient is confused and provides limited history.  Says she only drank vodka and no methanol, antifreeze or any rubbing alcohol or mouthwash.  Patient found to be acute severe metabolic acidosis with anion gap in the 30s, severe lactic acidosis in the 10s, ATN, transaminitis and CPK of 11,000.  Alcohol level of 450s.  Hospital course Principal problem  Sepsis, noninfectious with multiorgan failure Secondary to alcoholic hepatitis + severe metabolic acidosis and ketosis with severe dehydration. Negative  for other volatile's (ethylene glycol, methanol and isopropyl alcohol) Patient monitored in stepdown unit with aggressive hydration, Precedex drip.  Now resolved and stable. Appreciate PCCM and renal consult.   Acute symptoms  Alcohol withdrawal with acute delirium/acute metabolic encephalopathy Significantly improving with Precedex drip.  No further withdrawal symptoms.  Will prescribe 2-3 days of Librium for as needed use. Patient counseled extensively during hospital stay regarding alcohol cessation.  She is very eager to enroll herself into an alcohol rehab program.  As per SW "Pt requested referral to [facility] CSW contacted admissions who sent referral packet- filled out with pt and her mother and faxed to [phone]. Admissions states can take up to 48 hours for review and response. If pt discharges prior to that time, can send DC medication list and information and depending on barriers, pt may be able to admit from home. "     Acute rhabdomyolysis CPK 11k on presentation, now improved to <700 today.   Acute tubular necrosis Prerenal (Fena 0.3) ATN.  Resolved with aggressive hydration.  Renal consult appreciated..   Anemia/thrombocytopenia Noted for significant drop in H&H (hemoglobin of 7.6 from 14 on admission).  Reportedly also had coffee-ground emesis x1.  Received IV Protonix.  Suspect alcoholic gastritis.  Hemoglobin and platelets now improving.  Iron panel shows mild iron deficiency.  B12 normal. Needs to establish care as outpatient.  Acute transaminitis Secondary to alcoholic hepatitis.  Did not require steroid.    Improved to normal.  Major depressive disorder with psychotic behavior Has been hospitalized to [facility] in the past.  As per wife she has ongoing issue with drinking and multiple ED visits.  Patient has expressed being suicidal and threatening  the wife multiple times. Patient wishes to check into alcohol rehab.  Says  being in behavioral health previously has helped.  Psych consult appreciated who recommended starting patient on Lexapro 10 mg daily and social work to provide resources for substance abuse treatment programs.   ?UTI Received 3 days of Rocephin.  Hypocalcemia/hypokalemia/hypomagnesemia/hypophosphatemia Replenished aggressively   Prolonged QTC Present on admission, subsequently resolved.  Patient stable to be discharged home and communicate with rehab facility to enroll in.   Family Communication  : Updated mother and wife during hospital stay  Disposition Plan  : Home  Consults  :  PCCM/ renal  Procedures  : [place] abd, PICC line    Discharge Instructions   Allergies as of 03/21/2019   No Known Allergies     Medication List    TAKE these medications   chlordiazePOXIDE 25 MG capsule Commonly known as: LIBRIUM Take 1 capsule (25 mg total) by mouth 3 (three) times daily as needed for up to 3 days for anxiety or withdrawal.   escitalopram 10 MG tablet Commonly known as: LEXAPRO Take 1 tablet (10 mg total) by mouth daily. Start taking on: March 22, 2019      Follow-up Information    referral made for inaptient rehab Follow up.          No Known Allergies    Procedures/Studies: Ct Head Wo Contrast  Result Date: 03/15/2019 CLINICAL DATA:  Altered mental status. Suspected ethanol intoxication EXAM: CT HEAD WITHOUT CONTRAST TECHNIQUE: Contiguous axial images were obtained from the base of the skull through the vertex without intravenous contrast. COMPARISON:  February 21, 2019 FINDINGS: Brain: The ventricles are normal in size and configuration. There is no intracranial mass, hemorrhage, extra-axial fluid collection, or midline shift. The brain parenchyma appears unremarkable. No acute infarct evident. Vascular: There is no hyperdense vessel. There is no evident vascular calcification. Skull: The bony calvarium appears intact. Sinuses/Orbits: There is mucosal  thickening and opacification in multiple ethmoid air cells. Other paranasal sinuses are clear. Orbits appear symmetric bilaterally. Other: Mastoid air cells are clear. IMPRESSION: Areas of ethmoid sinus disease.  Study otherwise unremarkable. Electronically Signed   By: Bretta Bang III M.D.   On: 03/15/2019 21:45   Ct Head Wo Contrast  Result Date: 02/21/2019 CLINICAL DATA:  Unresponsive EXAM: CT HEAD WITHOUT CONTRAST CT CERVICAL SPINE WITHOUT CONTRAST TECHNIQUE: Multidetector CT imaging of the head and cervical spine was performed following the standard protocol without intravenous contrast. Multiplanar CT image reconstructions of the cervical spine were also generated. COMPARISON:  CT 02/04/2019, 01/23/2019 FINDINGS: CT HEAD FINDINGS Brain: No evidence of acute infarction, hemorrhage, hydrocephalus, extra-axial collection or mass lesion/mass effect. Vascular: No hyperdense vessel or unexpected calcification. Skull: Normal. Negative for fracture or focal lesion. Sinuses/Orbits: Mucosal thickening in the ethmoid sinuses Other: None CT CERVICAL SPINE FINDINGS Alignment: Unable to adequately evaluate C1-C2 articulation due to marked degree of head rotation. Remainder of the cervical alignment is within normal limits. Facet alignment is within normal limits. Skull base and vertebrae: No acute fracture. No primary bone lesion or focal pathologic process. Soft tissues and spinal canal: No prevertebral fluid or swelling. No visible canal hematoma. Disc levels: Mild posterior spurring at C3-C4. Disc spaces are maintained. Upper chest: Negative. Other: None IMPRESSION: 1. Negative non contrasted CT appearance of the brain. 2. No obvious fracture. Unable to adequately evaluate C1-C2 articulation, secondary to severe degree of head rotation and limited utility of the coronal and sagittal views of this region. Rotary  subluxation is unable to be excluded on the current images. Electronically Signed   By: Jasmine PangKim  Fujinaga  M.D.   On: 02/21/2019 22:09   Ct Cervical Spine Wo Contrast  Result Date: 02/21/2019 CLINICAL DATA:  Unresponsive EXAM: CT HEAD WITHOUT CONTRAST CT CERVICAL SPINE WITHOUT CONTRAST TECHNIQUE: Multidetector CT imaging of the head and cervical spine was performed following the standard protocol without intravenous contrast. Multiplanar CT image reconstructions of the cervical spine were also generated. COMPARISON:  CT 02/04/2019, 01/23/2019 FINDINGS: CT HEAD FINDINGS Brain: No evidence of acute infarction, hemorrhage, hydrocephalus, extra-axial collection or mass lesion/mass effect. Vascular: No hyperdense vessel or unexpected calcification. Skull: Normal. Negative for fracture or focal lesion. Sinuses/Orbits: Mucosal thickening in the ethmoid sinuses Other: None CT CERVICAL SPINE FINDINGS Alignment: Unable to adequately evaluate C1-C2 articulation due to marked degree of head rotation. Remainder of the cervical alignment is within normal limits. Facet alignment is within normal limits. Skull base and vertebrae: No acute fracture. No primary bone lesion or focal pathologic process. Soft tissues and spinal canal: No prevertebral fluid or swelling. No visible canal hematoma. Disc levels: Mild posterior spurring at C3-C4. Disc spaces are maintained. Upper chest: Negative. Other: None IMPRESSION: 1. Negative non contrasted CT appearance of the brain. 2. No obvious fracture. Unable to adequately evaluate C1-C2 articulation, secondary to severe degree of head rotation and limited utility of the coronal and sagittal views of this region. Rotary subluxation is unable to be excluded on the current images. Electronically Signed   By: Jasmine PangKim  Fujinaga M.D.   On: 02/21/2019 22:09   [place] Abdomen Complete  Result Date: 03/16/2019 CLINICAL DATA:  Transaminitis. EXAM: ABDOMEN ULTRASOUND COMPLETE COMPARISON:  Ultrasound of February 27, 2017. FINDINGS: Gallbladder: No gallstones or wall thickening visualized. No sonographic Murphy  sign noted by sonographer. Common bile duct: Diameter: 3 mm which is within normal limits. Liver: No focal lesion identified. Within normal limits in parenchymal echogenicity. Portal vein is patent on color Doppler imaging with normal direction of blood flow towards the liver. IVC: No abnormality visualized. Pancreas: Visualized portion unremarkable. Spleen: Size and appearance within normal limits. Right Kidney: Length: 10.4 cm. Echogenicity within normal limits. No mass or hydronephrosis visualized. Left Kidney: Length: 10.2 cm. Echogenicity within normal limits. No mass or hydronephrosis visualized. Abdominal aorta: No aneurysm visualized. Other findings: Large anechoic abnormality is noted in left upper quadrant which contains multiple internal echoes most consistent with dilated stomach. IMPRESSION: Exam is somewhat limited as patient could not hold still. Large anechoic abnormality with internal echoes is noted in left upper quadrant which most likely represents significantly distended stomach. No other abnormality seen in the abdomen. Electronically Signed   By: Lupita RaiderJames  Green Jr M.D.   On: 03/16/2019 09:37   Dg Chest Portable 1 View  Result Date: 03/15/2019 CLINICAL DATA:  Altered mental status.  Hypotension. EXAM: PORTABLE CHEST 1 VIEW COMPARISON:  February 03, 2019 FINDINGS: There is no edema or consolidation. Heart size and pulmonary vascularity are normal. No adenopathy. There is postop change in the right clavicle. IMPRESSION: No edema or consolidation. Electronically Signed   By: Bretta BangWilliam  Woodruff III M.D.   On: 03/15/2019 16:32   [place] Ekg Site Rite  Result Date: 03/16/2019 If Site Rite image not attached, placement could not be confirmed due to current cardiac rhythm.      Subjective: NAD. C/o some pain in left hand  Discharge Exam: Vitals:   03/20/19 2053 03/21/19 0827  BP: 120/87 124/89  Pulse: 75 72  Resp: 18 18  Temp: 98.4 F (36.9 C) 99 F (37.2 C)  SpO2: 100% 99%   Vitals:    03/20/19 0800 03/20/19 1305 03/20/19 2053 03/21/19 0827  BP: (!) 136/99 120/89 120/87 124/89  Pulse: 82 88 75 72  Resp: (!) 21 16 18 18   Temp: 98.6 F (37 C) 98.4 F (36.9 C) 98.4 F (36.9 C) 99 F (37.2 C)  TempSrc: Oral Oral Oral Oral  SpO2: 100% 95% 100% 99%  Weight:  64.1 kg    Height:  5\' 10"  (1.778 m)      General: NAD  HEENT: moist mucosa, supple neck  chest: clear b/l CVS: N S1&S2, no murmurs GI: soft, NT, ND, BS+ Musculoskeletal: warm, no edema      The results of significant diagnostics from this hospitalization (including imaging, microbiology, ancillary and laboratory) are listed below for reference.     Microbiology: Recent Results (from the past 240 hour(s))  Urine culture     Status: None   Collection Time: 03/15/19  4:10 PM   Specimen: Urine, Random  Result Value Ref Range Status   Specimen Description   Final    URINE, RANDOM Performed at [facility], [address] [place], [place] [address]    Special Requests   Final    NONE Performed at [facility], [address] [place], [place] [address]    Culture   Final    NO GROWTH Performed at [facility], [address], [place], [place] [address]    Report Status 03/16/2019 FINAL  Final  Blood culture (routine x 2)     Status: None   Collection Time: 03/15/19  5:02 PM   Specimen: BLOOD LEFT HAND  Result Value Ref Range Status   Specimen Description   Final    BLOOD LEFT HAND Performed at [facility], [address], [place], [place] [address]    Special Requests   Final    BOTTLES DRAWN AEROBIC AND ANAEROBIC Blood Culture results may not be optimal due to an inadequate volume of blood received in culture bottles Performed at [facility], [address] [place], [place] [address]    Culture   Final    NO GROWTH 5 DAYS Performed at [facility], [address], [place], [place] [address]     Report Status 03/20/2019 FINAL  Final  SARS CORONAVIRUS 2 (TAT 6-24 HRS) Nasopharyngeal Nasopharyngeal Swab     Status: None   Collection Time: 03/15/19 10:47 PM   Specimen: Nasopharyngeal Swab  Result Value Ref Range Status   SARS Coronavirus 2 NEGATIVE NEGATIVE Final    Comment: (NOTE) SARS-CoV-2 target nucleic acids are NOT DETECTED. The SARS-CoV-2 RNA is generally detectable in upper and lower respiratory specimens during the acute phase of infection. Negative results do not preclude SARS-CoV-2 infection, do not rule out co-infections with other pathogens, and should not be used as the sole basis for treatment or other patient management decisions. Negative results must be combined with clinical observations, patient history, and epidemiological information. The expected result is Negative. Fact Sheet for Patients: HairSlick.no Fact Sheet for Healthcare Providers: quierodirigir.com This test is not yet approved or cleared by the [place] FDA and  has been authorized for detection and/or diagnosis of SARS-CoV-2 by FDA under an Emergency Use Authorization (EUA). This EUA will remain  in effect (meaning this test can be used) for the duration of the COVID-19 declaration  under Section 56 4(b)(1) of the Act, 21 U.S.C. section 360bbb-3(b)(1), unless the authorization is terminated or revoked sooner. Performed at [place] [facility], [place] [address], [place], [place] [address]   MRSA PCR Screening     Status: Abnormal   Collection Time: 03/16/19 11:57 PM   Specimen: Nasal Mucosa; Nasopharyngeal  Result Value Ref Range Status   MRSA by PCR POSITIVE (A) NEGATIVE Final    Comment:        The GeneXpert MRSA Assay (FDA approved for NASAL specimens only), is one component of a comprehensive MRSA colonization surveillance program. It is not intended to diagnose MRSA infection nor to guide or monitor treatment for MRSA  infections. RESULT CALLED TO, READ BACK BY AND VERIFIED WITH: LANI RN AT 3419 ON 03/17/2019 BY S.VANHOORNE Performed at [facility], [place] [address] [place], [place] [address]      Labs: BNP (last 3 results) No results for input(s): BNP in the last 8760 hours. Basic Metabolic Panel: Recent Labs  Lab 03/16/19 0630  03/17/19 1104 03/18/19 0015 03/18/19 0500 03/18/19 0930 03/19/19 0729 03/20/19 0512 03/21/19 0416  NA 149*   < > 140 138 140  --  142 135 137  K 4.4   < > 3.2* 3.2* 3.4*  --  3.4* 2.9* 3.8  CL 109   < > 104 100 102  --  105 105 108  CO2 7*   < > 25 27 28   --  29 21* 23  GLUCOSE 89   < > 132* 116* 100*  --  87 84 103*  BUN 43*   < > 23* 14 8  --  8 <5* 5*  CREATININE 1.88*   < > 1.03* 0.64 0.53  --  0.60 0.53 0.53  CALCIUM 6.8*   < > 7.2* 7.5* 7.6*  --  8.2* 7.3* 8.6*  MG 2.1  --  1.8  --  1.9  --  1.6* 1.4*  --   PHOS 7.3*  --  1.1*  --   --  1.4* 1.9* 2.7  --    < > = values in this interval not displayed.   Liver Function Tests: Recent Labs  Lab 03/17/19 1104 03/18/19 0500 03/19/19 0729 03/20/19 0512 03/21/19 0416  AST 201* 138* 76* 61* 55*  ALT 58* 50* 41 34 39  ALKPHOS 55 55 62 51 69  BILITOT 0.8 0.9 0.9 0.4 0.4  PROT 4.8* 4.9* 4.9* 4.4* 5.7*  ALBUMIN 2.8* 2.8* 2.7* 2.4* 3.2*   Recent Labs  Lab 03/15/19 1600  LIPASE 17   No results for input(s): AMMONIA in the last 168 hours. CBC: Recent Labs  Lab 03/15/19 1600  03/17/19 0500 03/18/19 0500 03/19/19 0729 03/20/19 0512 03/21/19 0416  WBC 18.2*   < > 4.8 4.2 3.9* 3.4* 5.5  NEUTROABS 14.5*  --   --   --   --   --   --   HGB 14.3   < > 8.5* 8.8* 9.4* 7.6* 9.5*  HCT 45.1   < > 25.6* 25.8* 28.4* 22.7* 28.4*  MCV 104.2*   < > 98.1 99.6 100.4* 100.0 99.6  PLT 302   < > 122* 94* 88* 95* 143*   < > = values in this interval not displayed.   Cardiac Enzymes: Recent Labs  Lab 03/16/19 0755 03/17/19 1104 03/18/19 0930 03/19/19 0729 03/20/19 0512  CKTOTAL 11,827* 6,984*  2,529* 1,017* 755*   BNP: Invalid input(s): POCBNP CBG: Recent Labs  Lab 03/17/19 0215  GLUCAP 126*   D-Dimer No results for input(s): DDIMER in the last 72 hours. Hgb A1c No results for input(s): HGBA1C in the last 72 hours. Lipid Profile No results for input(s): CHOL, HDL, LDLCALC, TRIG, CHOLHDL, LDLDIRECT in the last 72 hours. Thyroid function studies No results for input(s): TSH, T4TOTAL, T3FREE, THYROIDAB in the last 72 hours.  Invalid input(s): FREET3 Anemia work up Recent Labs    03/20/19 1448  VITAMINB12 465  FERRITIN 196  TIBC 203*  IRON 27*   Urinalysis    Component Value Date/Time   COLORURINE YELLOW 03/15/2019 1610   APPEARANCEUR CLOUDY (A) 03/15/2019 1610   LABSPEC 1.018 03/15/2019 1610   PHURINE 5.0 03/15/2019 1610   GLUCOSEU NEGATIVE 03/15/2019 1610   HGBUR LARGE (A) 03/15/2019 1610   BILIRUBINUR NEGATIVE 03/15/2019 1610   KETONESUR 20 (A) 03/15/2019 1610   PROTEINUR 100 (A) 03/15/2019 1610   NITRITE NEGATIVE 03/15/2019 1610   LEUKOCYTESUR NEGATIVE 03/15/2019 1610   Sepsis Labs Invalid input(s): PROCALCITONIN,  WBC,  LACTICIDVEN Microbiology Recent Results (from the past 240 hour(s))  Urine culture     Status: None   Collection Time: 03/15/19  4:10 PM   Specimen: Urine, Random  Result Value Ref Range Status   Specimen Description   Final    URINE, RANDOM Performed at [facility], [address], [place], [place] [address]    Special Requests   Final    NONE Performed at [facility], [address] [place], [place] [address]    Culture   Final    NO GROWTH Performed at [facility], [address] [place], [place] [address]    Report Status 03/16/2019 FINAL  Final  Blood culture (routine x 2)     Status: None   Collection Time: 03/15/19  5:02 PM   Specimen: BLOOD LEFT HAND  Result Value Ref Range Status   Specimen Description   Final    BLOOD LEFT HAND Performed at [facility], [address], [place], [place] [address]    Special Requests   Final    BOTTLES DRAWN AEROBIC AND ANAEROBIC Blood Culture results may not be optimal due to an inadequate volume of blood received in culture bottles Performed at [facility], [address] [place], [place] [address]    Culture   Final    NO GROWTH 5 DAYS Performed at [facility], [address], [place], [place] [address]    Report Status 03/20/2019 FINAL  Final  SARS CORONAVIRUS 2 (TAT 6-24 HRS) Nasopharyngeal Nasopharyngeal Swab     Status: None   Collection Time: 03/15/19 10:47 PM   Specimen: Nasopharyngeal Swab  Result Value Ref Range Status   SARS Coronavirus 2 NEGATIVE NEGATIVE Final    Comment: (NOTE) SARS-CoV-2 target nucleic acids are NOT DETECTED. The SARS-CoV-2 RNA is generally detectable in upper and lower respiratory specimens during the acute phase of infection. Negative results do not preclude SARS-CoV-2 infection, do not rule out co-infections with other pathogens, and should not be used as the sole basis for treatment or other patient management decisions. Negative results must be combined with clinical observations, patient history, and epidemiological information. The expected result is Negative. Fact Sheet for Patients: HairSlick.no Fact Sheet for Healthcare Providers: quierodirigir.com This test is not yet approved or cleared by the [place] FDA and  has been authorized for detection and/or diagnosis of SARS-CoV-2 by FDA  under an Emergency Use Authorization (EUA). This EUA will remain  in effect (meaning this test can be used) for the duration of the COVID-19 declaration under Section 56 4(b)(1) of the Act, 21 U.S.C. section 360bbb-3(b)(1), unless the authorization is terminated or revoked sooner. Performed at [facility], [address] [place], [place] [address]   MRSA PCR  Screening     Status: Abnormal   Collection Time: 03/16/19 11:57 PM   Specimen: Nasal Mucosa; Nasopharyngeal  Result Value Ref Range Status   MRSA by PCR POSITIVE (A) NEGATIVE Final    Comment:        The GeneXpert MRSA Assay (FDA approved for NASAL specimens only), is one component of a comprehensive MRSA colonization surveillance program. It is not intended to diagnose MRSA infection nor to guide or monitor treatment for MRSA infections. RESULT CALLED TO, READ BACK BY AND VERIFIED WITH: LANI RN AT 6045 ON 03/17/2019 BY S.VANHOORNE Performed at [facility], [address], [place], [place] [address]      Time coordinating discharge: 35 minutes  SIGNED:   Eddie North, MD  [facility] 03/21/2019, 10:37 AM Pager   If 7PM-7AM, please contact night-coverage [email or web address] Password [email or web address]

## 2019-03-21 NOTE — Discharge Instructions (Signed)
Sheila Graves, Program Manager Email: [email or web address] Phone: [place] [facility] [place], [place]  Phone: [phone] Fax: [phone] [facility]- residential programs varying locations [phone] [email or web address]  [facility] (referral sent) [phone]) op#2 [phone] [place], [place] [address] [facility] and Admissions Fax Number:  [phone]

## 2019-03-21 NOTE — Progress Notes (Signed)
Late entry per ICU management.

## 2019-03-21 NOTE — TOC Progression Note (Signed)
Transition of Care [facility]) - Progression Note    Patient Details  Name: Sheila Graves MRN: [id number] Date of Birth: 06-08-1981  Transition of [facility]) CM/SW Contact  Joaquin Courts, RN Phone Number: 03/21/2019, 11:08 AM  Clinical Narrative:    Dc summary faxed to [facility].    Expected Discharge Plan: Home/Self Care Barriers to Discharge: Continued Medical Work up  Expected Discharge Plan and Services Expected Discharge Plan: Home/Self Care In-house Referral: Clinical Social Work(psychiatry)     Living arrangements for the past 2 months: Single Family Home Expected Discharge Date: 03/21/19                                     Social Determinants of Health (SDOH) Interventions    Readmission Risk Interventions No flowsheet data found.

## 2019-03-21 NOTE — Progress Notes (Signed)
Charted in error.

## 2019-03-21 NOTE — Progress Notes (Signed)
Patient has discharged to home on 03/21/2019. Discharge instruction including medication and appointment was given to patient. Patient has no question at this time.

## 2019-03-23 ENCOUNTER — Ambulatory Visit (INDEPENDENT_AMBULATORY_CARE_PROVIDER_SITE_OTHER)
Admission: RE | Admit: 2019-03-23 | Discharge: 2019-03-23 | Disposition: A | Discharge status: Home / Self Care | Payer: Self-pay | Source: Ambulatory Visit

## 2019-03-23 DIAGNOSIS — M79642 Pain in left hand: Secondary | ICD-10-CM

## 2019-03-23 MED ORDER — OMEPRAZOLE 20 MG PO CPDR: 20.0000 mg | DELAYED_RELEASE_CAPSULE | Freq: Every day | ORAL | 0 refills | Status: DC

## 2019-03-23 MED ORDER — MELOXICAM 7.5 MG PO TABS: 7.5000 mg | ORAL_TABLET | Freq: Every day | ORAL | 0 refills | Status: DC

## 2019-03-23 MED ORDER — DICLOFENAC SODIUM 1 % TD GEL: 2.0000 g | Freq: Four times a day (QID) | TRANSDERMAL | 0 refills | Status: DC

## 2019-03-23 NOTE — Discharge Instructions (Addendum)
Start Mobic. Do not take ibuprofen (motrin/advil)/ naproxen (aleve) while on mobic. Take with omeprazole to prevent upset stomach. You can use voltaren gel for further pain relief. Ice compress to the location. Follow up with orthopedics if symptoms still not improving. If noticing signs of infection, redness, swelling, warmth, follow up for in person evaluation.

## 2019-03-23 NOTE — ED Provider Notes (Signed)
Virtual Visit via Video Note:  Sheila Graves  initiated request for Telemedicine visit with [facility] team. I connected with Sheila Graves  on 03/23/2019 at 3:19 PM  for a synchronized telemedicine visit using a video enabled HIPPA compliant telemedicine application. I verified that I am speaking with Sheila Graves  using two identifiers. Amy Jodell Cipro, PA-C  was physically located in a [facility] Urgent care site and Sheila Graves was located at a different location.   The limitations of evaluation and management by telemedicine as well as the availability of in-person appointments were discussed. Patient was informed that she  may incur a bill ( including co-pay) for this virtual visit encounter. Sheila Graves  expressed understanding and gave verbal consent to proceed with virtual visit.     History of Present Illness:Sheila Graves  is a 38 y.o. female presents with 7-day history of left hand pain at IV site.  Patient was recently admitted 03/15/2019- 03/21/2019 for alcohol intoxication with dehydration.  States had IV site to both right and left upper extremity.  Left hand IV was inserted, but was clamped off and not used.  After 4 days of insertion, she had bruising and pain to IV site.  IV was removed, and patient was provided with ice compress.  Patient states symptoms has not improved, and has continued after discharge.  Pain is to the dorsal aspect of the hand with intermittent numbness and tingling to the fingers.  She has pain to IV site with movement of fingers and wrist.  Denies erythema, warmth.  Denies radiation of pain, swelling.  Denies chest pain, shortness of breath.  No blood thinner during admission, with SCD use for DVT prevention.  Denies bleeding to IV site, worsening of contusion.  Past Medical History:  Diagnosis Date  . Alcohol withdrawal seizure ([place])   . ETOH abuse   . Hypokalemia   . Pancreatitis   . Thyroid disease     No Known Allergies       Observations/Objective: General: Well appearing, nontoxic, no acute distress. Sitting comfortably. Head: Normocephalic, atraumatic Eye: No conjunctival injection, eyelid swelling. EOMI ENT: Mucus membranes moist, no lip cracking. No obvious nasal drainage. Pulm: Speaking in full sentences without difficulty. Normal effort. No respiratory distress, accessory muscle use. MSK:  Right: Forearm, antecubital area with significant contusion, no erythema or warmth.  Full  range of motion of elbow, hands.  Left: Dorsal hand with contusion. No swelling, erythema, warmth. No obvious cords seen,  patient denies cords palpated. Full ROM of wrist, fingers.  Neuro: Normal mental status. Alert and oriented x 3.   Assessment and Plan: Low suspicion for thrombophlebitis given no erythema, warmth, cords. We will try NSAIDs for possible inflammation causing symptoms.  Given history of alcohol abuse, will have patient take omeprazole with NSAIDs to prevent gastric ulcers.  Patient to continue ice compress to the area.  To follow-up with orthopedics for further evaluation if symptoms not improving.  Return precautions given.  Patient expresses understanding and agrees to plan.  Follow Up Instructions:    I discussed the assessment and treatment plan with the patient. The patient was provided an opportunity to ask questions and all were answered. The patient agreed with the plan and demonstrated an understanding of the instructions.   The patient was advised to call back or seek an in-person evaluation if the symptoms worsen or if the condition fails to improve as anticipated.  I provided 15 minutes of non-face-to-face time  during this encounter.    Belinda Fisher, PA-C  03/23/2019 3:19 PM         Belinda Fisher, PA-C 03/23/19 1525

## 2019-03-26 ENCOUNTER — Inpatient Hospital Stay
Admission: RE | Admit: 2019-03-26 | Discharge: 2019-03-26 | Disposition: A | Discharge status: Home / Self Care | Payer: Self-pay | Source: Ambulatory Visit

## 2019-03-29 ENCOUNTER — Other Ambulatory Visit: Payer: Self-pay

## 2019-03-29 ENCOUNTER — Emergency Department (HOSPITAL_COMMUNITY)
Admission: EM | Admit: 2019-03-29 | Discharge: 2019-03-30 | Disposition: A | Discharge status: Home / Self Care | Payer: Self-pay | Attending: Emergency Medicine | Admitting: Emergency Medicine

## 2019-03-29 ENCOUNTER — Encounter (HOSPITAL_COMMUNITY): Payer: Self-pay

## 2019-03-29 DIAGNOSIS — M25532 Pain in left wrist: Secondary | ICD-10-CM | POA: Insufficient documentation

## 2019-03-29 DIAGNOSIS — F17210 Nicotine dependence, cigarettes, uncomplicated: Secondary | ICD-10-CM | POA: Insufficient documentation

## 2019-03-29 NOTE — ED Triage Notes (Signed)
Pt arrived with complaints of left wrist numbness and pain after IV on 03/21/19 stating that she was prescribed Mobic and Voltaren for nerve damage. States she wants to go to rehab but worried about her pain.   Patient stating she has had alcohol today but does not want her significant other to know she has been drinking.

## 2019-03-29 NOTE — ED Notes (Signed)
PT CONCERNED ABOUT HER FRIENDS HAVING FOOD. SHE IS REQUESTING DIRECTIONS TO OUR SUBWAY. INFORMATION GIVEN. GIVEN INFORMATION ABOUT BED STATUS AND PLAN OF CARE. AWARE SHE SHOULD NOT EAT ANYTHING. SHE UNDERSTANDS. OBSERVED PT LEAVING LOBBY. REGISTRATION "AMBER" PRESENT TO WITNESS CONVERSATION

## 2019-03-30 MED ORDER — CYCLOBENZAPRINE HCL 10 MG PO TABS
5.0000 mg | ORAL_TABLET | Freq: Once | ORAL | Status: AC
  Administered 2019-03-30: 5 mg via ORAL
  Filled 2019-03-30: qty 1

## 2019-03-30 MED ORDER — CYCLOBENZAPRINE HCL 10 MG PO TABS: 10.0000 mg | ORAL_TABLET | Freq: Two times a day (BID) | ORAL | 0 refills | Status: DC | PRN

## 2019-03-30 NOTE — ED Provider Notes (Signed)
[facility] DEPT Provider Note   CSN: [id number] Arrival date & time: 03/29/19  1939     History   Chief Complaint Chief Complaint  Patient presents with  . Wrist Pain    HPI Sheila Graves is a 38 y.o. female with past medical history significant for alcohol withdrawal seizure, etoh abuse, pancreatitis present to emergency department today with chief complaint of left wrist pain. Pt states she was recently admitted for 6 days (9/16-9/22) after being found unresponsive in hotel. She was admitted for intoxication with sepsis and metabolic crisis. Pt states since being discharged home she has had pain in her left wrist where an IV had been placed during hospital stay  Today she is reporting pain to the volar aspect of left wrist. She has intermittent numbness in fingers and pain with moving finger.She was evaluated by urgent care and prescribed voltaren gel and meloxicam. Pt has been using the voltaren gel with transient relief. Pt did not use meloxicam, stating she doesn't have arthritis and does not need it. She was recommend to follow up with ortho if symptoms persist. She called an ortho office but is not able to afford the copay so she did not go. Denies erythema, warmth, radiation of pain, swelling, chest pain, shortness of breath.  Not anticoagulated.   History provided by patient with additional history obtained from chart review.     Past Medical History:  Diagnosis Date  . Alcohol withdrawal seizure ([place])   . ETOH abuse   . Hypokalemia   . Pancreatitis   . Thyroid disease     Patient Active Problem List   Diagnosis Date Noted  . Alcohol withdrawal syndrome with complication, with unspecified complication ([place]) 40/98/1191  . Acute metabolic encephalopathy 47/82/9562  . Rhabdomyolysis 03/21/2019  . Alcohol use disorder, severe, dependence ([place])   . Prolonged QT interval 03/16/2019  . Metabolic acidosis 13/01/6577  . AKI (acute kidney injury)  ([place])   . Transaminitis   . Dehydration 03/15/2019  . Alcohol intoxication ([place]) 02/04/2019  . Hypokalemia 02/04/2019  . Hypothermia 02/04/2019  . Hypotension 02/04/2019  . Fracture of clavicle, right, open 11/11/2018  . Major depressive disorder, recurrent, severe without psychotic features ([place]) 01/31/2018    Past Surgical History:  Procedure Laterality Date  . FINGER SURGERY    . IRRIGATION AND DEBRIDEMENT SHOULDER Right 11/12/2018   Procedure: Irrigation And Debridement Shoulder;  Surgeon: Shona Needles, MD;  Location: [facility];  Service: Orthopedics;  Laterality: Right;  . ORIF CLAVICULAR FRACTURE Right 11/12/2018   Procedure: OPEN REDUCTION INTERNAL FIXATION (ORIF) CLAVICULAR FRACTURE;  Surgeon: Shona Needles, MD;  Location: [facility];  Service: Orthopedics;  Laterality: Right;     OB History   No obstetric history on file.      Home Medications    Prior to Admission medications   Medication Sig Start Date End Date Taking? Authorizing Provider  cyclobenzaprine (FLEXERIL) 10 MG tablet Take 1 tablet (10 mg total) by mouth 2 (two) times daily as needed for muscle spasms. 03/30/19   Albrizze, Kaitlyn E, PA-C  diclofenac sodium (VOLTAREN) 1 % GEL Apply 2 g topically 4 (four) times daily. 03/23/19   Tasia Catchings, Amy V, PA-C  escitalopram (LEXAPRO) 10 MG tablet Take 1 tablet (10 mg total) by mouth daily. 03/22/19   Dhungel, Flonnie Overman, MD  meloxicam (MOBIC) 7.5 MG tablet Take 1 tablet (7.5 mg total) by mouth daily. 03/23/19   Tasia Catchings, Amy V, PA-C  omeprazole (PRILOSEC) 20 MG  capsule Take 1 capsule (20 mg total) by mouth daily. Take with mobic 03/23/19   Belinda Fisher, PA-C    Family History Family History  Problem Relation Age of Onset  . Alcohol abuse Other     Social History Social History   Tobacco Use  . Smoking status: Current Every Day Smoker    Packs/day: 1.00  . Smokeless tobacco: Never Used  Substance Use Topics  . Alcohol use: Yes    Comment: fifth of liquor  . Drug use: Not Currently     Types: Heroin     Allergies   Patient has no known allergies.   Review of Systems Review of Systems  Constitutional: Negative for chills and fever.  HENT: Negative for congestion, ear discharge, ear pain, sinus pressure, sinus pain and sore throat.   Eyes: Negative for pain and redness.  Respiratory: Negative for cough and shortness of breath.   Cardiovascular: Negative for chest pain.  Gastrointestinal: Negative for abdominal pain, constipation, diarrhea, nausea and vomiting.  Genitourinary: Negative for dysuria and hematuria.  Musculoskeletal: Positive for arthralgias. Negative for back pain, joint swelling and neck pain.  Skin: Negative for color change, rash and wound.  Neurological: Negative for weakness, numbness and headaches.     Physical Exam Updated Vital Signs BP 94/66 (BP Location: Left Arm)   Pulse 90   Temp 98.4 F (36.9 C) (Oral)   Resp 16   Ht 5\' 11"  (1.803 m)   Wt 63.5 kg   SpO2 100%   BMI 19.53 kg/m   Physical Exam Vitals signs and nursing note reviewed.  Constitutional:      General: She is not in acute distress.    Appearance: She is not ill-appearing.  HENT:     Head: Normocephalic and atraumatic.     Right Ear: Tympanic membrane and external ear normal.     Left Ear: Tympanic membrane and external ear normal.     Nose: Nose normal.     Mouth/Throat:     Mouth: Mucous membranes are moist.     Pharynx: Oropharynx is clear.  Eyes:     General: No scleral icterus.       Right eye: No discharge.        Left eye: No discharge.     Extraocular Movements: Extraocular movements intact.     Conjunctiva/sclera: Conjunctivae normal.     Pupils: Pupils are equal, round, and reactive to light.  Neck:     Musculoskeletal: Normal range of motion.     Vascular: No JVD.  Cardiovascular:     Rate and Rhythm: Normal rate and regular rhythm.     Pulses: Normal pulses.          Radial pulses are 2+ on the right side and 2+ on the left side.     Heart  sounds: Normal heart sounds.  Pulmonary:     Comments: Lungs clear to auscultation in all fields. Symmetric chest rise. No wheezing, rales, or rhonchi. Abdominal:     Comments: Abdomen is soft, non-distended, and non-tender in all quadrants. No rigidity, no guarding. No peritoneal signs.  Musculoskeletal: Normal range of motion.     Comments: Right upper extremity will full ROM, no swelling,erythema, or signs of injury. Left wrist volar aspect without swelling, erythema, warmth, contusion. No obvious cords seen, no cords palpated. Full ROM of wrist, fingers. Sensation in tact. Grip strength 5/5 in bilateral upper extremities.  Skin:    General: Skin is warm and  dry.     Capillary Refill: Capillary refill takes less than 2 seconds.  Neurological:     Mental Status: She is oriented to person, place, and time.     GCS: GCS eye subscore is 4. GCS verbal subscore is 5. GCS motor subscore is 6.     Comments: Fluent speech, no facial droop.  Psychiatric:        Behavior: Behavior normal.      ED Treatments / Results  Labs (all labs ordered are listed, but only abnormal results are displayed) Labs Reviewed - No data to display  EKG None  Radiology No results found.  Procedures Procedures (including critical care time)  Medications Ordered in ED Medications  cyclobenzaprine (FLEXERIL) tablet 5 mg (5 mg Oral Given 03/30/19 0125)     Initial Impression / Assessment and Plan / ED Course  I have reviewed the triage vital signs and the nursing notes.  Pertinent labs & imaging results that were available during my care of the patient were reviewed by me and considered in my medical decision making (see chart for details).  Patient presents to the ED with complaints of pain to the left wrist s/p IV during recent admission. Exam without obvious deformity or open wounds. ROM intact. Nontender to palpation. NVI distally. Thrombophlebitis is unlikely given no palpable cords, erythema, warmth  on exam.   Therapeutic splint provided. Advised to avoid NSAIDs given her history. I discussed results, treatment plan, need for follow-up with pcp or ortho, and return precautions with the patient. Provided opportunity for questions, patient confirmed understanding and are in agreement with plan.  Resource list given.   Portions of this note were generated with Scientist, clinical (histocompatibility and immunogenetics)Dragon dictation software. Dictation errors may occur despite best attempts at proofreading.  Final Clinical Impressions(s) / ED Diagnoses   Final diagnoses:  Left wrist pain    ED Discharge Orders         Ordered    cyclobenzaprine (FLEXERIL) 10 MG tablet  2 times daily PRN     03/30/19 0016           Sherene Sireslbrizze, Kaitlyn E, PA-C 03/31/19 1410    Nira Connardama, Pedro Eduardo, MD 04/05/19 [phone]

## 2019-03-30 NOTE — Discharge Instructions (Signed)
You have been seen today for pain in your left wrist. Please read and follow all provided instructions. Return to the emergency room for worsening condition or new concerning symptoms.    1. Medications:  -Prescription sent to the pharmacy for flexeril. This is a muscle relaxer. You can take this medicine every 12 hours, however most people take it at night before bed because it makes them drowsy and helps them sleep. Do not drive or work while taking this medication. -You can continue to use the Voltaren gel as prescribed by urgent care. -Continue usual home medications Take medications as prescribed. Please review all of the medicines and only take them if you do not have an allergy to them.   2. Treatment: rest, drink plenty of fluids. Wear the brace for comfort.  3. Follow Up: Please follow up with orthopedics as we discussed. I have included the information for our on call orthopedist Dr. Orlan Leavensrtman. Call his office to schedule follow up appointment for further evaluation.    Our doctors and staff appreciate your choosing us for your emergency medical care needs. We are here to serve you.  RESOURCE GUIDE  Chronic Pain Problems: Contact [facility]  [phone] Patients need to be referred by their primary care doctor.  Insufficient Money for Medicine: Contact United Way:  call "211" or [facility] [phone].  No Primary Care Doctor: Call Health Connect  [phone] - can help you locate a primary care doctor that  accepts your insurance, provides certain services, etc. Physician Referral Service- [phone]  Agencies that provide inexpensive medical care: [facility]  [phone] [facility]  [phone] [facility]  [phone] [facility]  [phone] Planned Parenthood  [phone] [facility]  [phone]  Medicaid-accepting [facility] Providers: [facility]- [address], [address]  [phone], Mon-Fri 9am-7pm, Sat 9am-1pm [facility]- [address], [address] [place]  [phone] [facility]- [address], [address] [place]  [phone] [facility]- [address]  [phone] Renaye RakersVeita Bland- [address], [address], [phone]  Only accepts [place] patients after they have their name  applied to their card  Self Pay (no insurance) in [facility]: Sickle Cell Patients: Dr Willey BladeEric Dean, [facility]  [address], [phone] [facility]- [address]  [phone]       Redge Gainer-     [facility]- [address]       -     [facility]- see information above (Speak to CitigroupPam H if you do not have insurance)       -  [facility]- [address], [phone]       -  [facility]- [address],  [phone]       -  [facility]- [address], [phone]       -  Dr Julio Sickssei-Bonsu-  [address], [address], [place], [phone]       -  [facility]- [address], [phone]       -  [facility]- [address], [phone], also [address], [phone]       -    [facility]- [address], [phone], 1st & 3rd  Saturday   every month, 10am-1pm  1) Find a Doctor and Pay Out of Pocket Although you won't have to find out who is covered by your insurance plan, it is a good idea to ask around and get recommendations. You will then need to call the office and see if the doctor you have chosen will accept you as a new patient and what types of options they offer for patients who are self-pay. Some doctors offer discounts or will set up payment plans for their patients who do not have insurance, but you will need to ask so you aren't surprised when you get to your appointment.  2) Contact Your Local Health Department Not all health departments have  doctors that can see patients for sick visits, but many do, so it is worth a call to see if yours does. If you don't know where your local health department is, you can check in your phone book. The CDC also has a tool to help you locate your state's health department, and many state websites also have listings of all of their local health departments.  3) Find a Walk-in Clinic If your illness is not likely to be very severe or complicated, you may want to try a walk in clinic. These are popping up all over the country in pharmacies, drugstores, and shopping centers. They're usually staffed by nurse practitioners or physician assistants that have been trained to treat common illnesses and complaints. They're usually fairly quick and inexpensive. However, if you have serious medical issues or chronic medical problems, these are probably not your best option  STD Testing [facility] Department of [facility] [place], STD Clinic, [address], [place], phone [phone] or [phone].  Monday - Friday, call for an appointment. [facility] Department of [employer], STD Clinic, [address], [place], phone [phone] or [phone].  Monday - Friday, call for an appointment.  Abuse/Neglect: [facility] Child Abuse Hotline (201) 696-4471 [facility] Child Abuse Hotline 862-294-0667 (After Hours)  Emergency Shelter:  [facility] (321)212-7405  Maternity Homes: Room at the [place] [facility] (724)348-1056 Rebeca Alert Services [phone]  MRSA Hotline #:   365-217-1464  [facility] Resources  [facility]  United Way [facility] Dept. [address]                 [address]         [address]  Blondell Reveal Phone:  [phone]                                  Phone:  [phone]                    Phone:  [phone]  [facility], [phone] [facility] [place]- [phone]       -     [place] in [place], [address]                                  [place] [phone] or [phone] (After Hours)   [place] Substance Abuse Resources: Alcohol and Drug Services  [place] (419)605-1360 [facility] [place] Chinita Pester ([phone] Residential & Outpatient Substance Abuse Program  (310)028-6631  Psychological Services: [place]  [phone] [place]  [place], [place] [address] [place], [place]: [phone] or [phone], PicCapture.uy  Dental Assistance  If unable to pay or uninsured, contact:  [facility] or [facility]. to become qualified for the adult dental clinic.  Patients with Medicaid: [facility] [phone] [address], [place] [address], [phone]  If unable to pay, or uninsured, contact [facility] [phone]) or [place] [phone] in [place], [place] in [facility]) to become qualified for the adult dental clinic   Other [place]- [place], [place], [place], [address], [place], [place], 2nd and 4th Thursday of the month at 6:30am.  10 clients each day by appointment, can sometimes see walk-in patients if someone does not show for an appointment. [facility]- [address] Hillard Danker [place], [place], [address], [place], [place], [place], [address], [place] Department- [phone] [facility]  [phone]

## 2019-03-31 ENCOUNTER — Other Ambulatory Visit: Payer: Self-pay

## 2019-03-31 ENCOUNTER — Emergency Department (HOSPITAL_COMMUNITY)
Admission: EM | Admit: 2019-03-31 | Discharge: 2019-04-01 | Disposition: A | Discharge status: Home / Self Care | Payer: Self-pay | Attending: Emergency Medicine | Admitting: Emergency Medicine

## 2019-03-31 ENCOUNTER — Encounter (HOSPITAL_COMMUNITY): Payer: Self-pay

## 2019-03-31 DIAGNOSIS — Z79899 Other long term (current) drug therapy: Secondary | ICD-10-CM | POA: Insufficient documentation

## 2019-03-31 DIAGNOSIS — R45851 Suicidal ideations: Secondary | ICD-10-CM | POA: Insufficient documentation

## 2019-03-31 DIAGNOSIS — F10929 Alcohol use, unspecified with intoxication, unspecified: Secondary | ICD-10-CM

## 2019-03-31 DIAGNOSIS — F17210 Nicotine dependence, cigarettes, uncomplicated: Secondary | ICD-10-CM | POA: Insufficient documentation

## 2019-03-31 DIAGNOSIS — R40243 Glasgow coma scale score 3-8, unspecified time: Secondary | ICD-10-CM | POA: Insufficient documentation

## 2019-03-31 DIAGNOSIS — F10129 Alcohol abuse with intoxication, unspecified: Secondary | ICD-10-CM | POA: Insufficient documentation

## 2019-03-31 LAB — COMPREHENSIVE METABOLIC PANEL
ALT: 40 U/L (ref 0–44)
AST: 72 U/L — ABNORMAL HIGH (ref 15–41)
Albumin: 4.3 g/dL (ref 3.5–5.0)
Alkaline Phosphatase: 97 U/L (ref 38–126)
Anion gap: 11 (ref 5–15)
BUN: 12 mg/dL (ref 6–20)
CO2: 24 mmol/L (ref 22–32)
Calcium: 8.9 mg/dL (ref 8.9–10.3)
Chloride: 106 mmol/L (ref 98–111)
Creatinine, Ser: 0.63 mg/dL (ref 0.44–1.00)
GFR calc Af Amer: >60 mL/min (ref >60)
GFR calc non Af Amer: >60 mL/min (ref >60)
Glucose, Bld: 102 mg/dL — ABNORMAL HIGH (ref 70–99)
Potassium: 3.8 mmol/L (ref 3.5–5.1)
Sodium: 141 mmol/L (ref 135–145)
Total Bilirubin: 0.5 mg/dL (ref 0.3–1.2)
Total Protein: 6.8 g/dL (ref 6.5–8.1)

## 2019-03-31 LAB — CBC
HCT: 36.2 % (ref 36.0–46.0)
Hemoglobin: 11.9 g/dL — ABNORMAL LOW (ref 12.0–15.0)
MCH: 33.2 pg (ref 26.0–34.0)
MCHC: 32.9 g/dL (ref 30.0–36.0)
MCV: 101.1 fL — ABNORMAL HIGH (ref 80.0–100.0)
Platelets: 613 10*3/uL — ABNORMAL HIGH (ref 150–400)
RBC: 3.58 MIL/uL — ABNORMAL LOW (ref 3.87–5.11)
RDW: 14.6 % (ref 11.5–15.5)
WBC: 4.5 10*3/uL (ref 4.0–10.5)
nRBC: 0 % (ref 0.0–0.2)

## 2019-03-31 LAB — RAPID URINE DRUG SCREEN, HOSP PERFORMED
Amphetamines: NOT DETECTED
Barbiturates: NOT DETECTED
Benzodiazepines: POSITIVE — AB
Cocaine: NOT DETECTED
Opiates: NOT DETECTED
Tetrahydrocannabinol: NOT DETECTED

## 2019-03-31 LAB — SALICYLATE LEVEL: Salicylate Lvl: <7 mg/dL (ref 2.8–30.0)

## 2019-03-31 LAB — I-STAT BETA HCG BLOOD, ED (MC, WL, AP ONLY): I-stat hCG, quantitative: <5 m[IU]/mL (ref <5)

## 2019-03-31 LAB — ETHANOL: Alcohol, Ethyl (B): 402 mg/dL (ref <10)

## 2019-03-31 LAB — ACETAMINOPHEN LEVEL: Acetaminophen (Tylenol), Serum: <10 ug/mL — ABNORMAL LOW (ref 10–30)
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
## 2019-03-31 NOTE — ED Notes (Signed)
Spoke to patients wife, Ander Purpura, she doesn't want to talk to her anymore tonight. She also states that  She needs to be committed and it hasn't gone through. Pt is currently cooperating and is no longer a confidential patient

## 2019-03-31 NOTE — ED Triage Notes (Addendum)
Pt BIB [facility] for ETOH. She reports that she would like information for a rehab facility. Also reports continued wrist pain. Pt also states that she wants to kill herself because no rehabs will accept her.

## 2019-04-01 MED ORDER — LORAZEPAM 2 MG/ML IJ SOLN: 0.0000 mg | Freq: Four times a day (QID) | INTRAMUSCULAR | Status: DC

## 2019-04-01 MED ORDER — ZIPRASIDONE MESYLATE 20 MG IM SOLR
20.0000 mg | Freq: Once | INTRAMUSCULAR | Status: AC
  Administered 2019-04-01: 20 mg via INTRAMUSCULAR

## 2019-04-01 MED ORDER — ZIPRASIDONE MESYLATE 20 MG IM SOLR
INTRAMUSCULAR | Status: AC
  Filled 2019-04-01: qty 20

## 2019-04-01 MED ORDER — STERILE WATER FOR INJECTION IJ SOLN
INTRAMUSCULAR | Status: AC
  Administered 2019-04-01: 06:00:00
  Filled 2019-04-01: qty 10

## 2019-04-01 MED ORDER — LORAZEPAM 1 MG PO TABS: 0.0000 mg | ORAL_TABLET | Freq: Two times a day (BID) | ORAL | Status: DC

## 2019-04-01 MED ORDER — VITAMIN B-1 100 MG PO TABS: 100.0000 mg | ORAL_TABLET | Freq: Every day | ORAL | Status: DC

## 2019-04-01 MED ORDER — LORAZEPAM 2 MG/ML IJ SOLN: 0.0000 mg | Freq: Two times a day (BID) | INTRAMUSCULAR | Status: DC

## 2019-04-01 MED ORDER — THIAMINE HCL 100 MG/ML IJ SOLN: 100.0000 mg | Freq: Every day | INTRAMUSCULAR | Status: DC

## 2019-04-01 MED ORDER — LORAZEPAM 1 MG PO TABS: 0.0000 mg | ORAL_TABLET | Freq: Four times a day (QID) | ORAL | Status: DC

## 2019-04-01 NOTE — Patient Outreach (Addendum)
CPSS tried meeting with the patient to provide substance use recovery support services, but the patient is unable to participate in the CPSS assessment. Patient is still very sleepy at this time. Per EDP note, patient reports a history of alcohol use and was requesting help with wanting information for substance use treatment services. CPSS will try to follow up with the patient at later time this date in order to complete the CPSS assessment and talk to the patient about/provide follow up information for substance use treatment resources.

## 2019-04-01 NOTE — ED Notes (Signed)
[place] WITH CLOTHES THAT PT WORE IN AND A CELL PHONE ARE IN LOCKER 34.

## 2019-04-01 NOTE — Patient Outreach (Signed)
CPSS met with the patient to provide substance use recovery support and help with getting connected to substance use recovery resources. Patient reports a history of alcohol use and wants help with getting connected to long-term residential substance use treatment resources. Patient has tried following up with long-term residential treatment resources on her own with the patient being denied from the [employer] for residential treatment and [facility] in [place], [place] talked to the patient about [facility] as a residential treatment possibility with their 21 day treatment program, but the patient is only interested in longer-term substance use treatment at this time. CPSS explained to the patient that most residential substance use treatment centers that work with individuals who are uninsured do not do admissions on the weekends except for [facility]. CPSS also explained to the patient how most longer-term residential substance use treatment centers have long wait lists at this time due to social distancing guidelines in place. CPSS then talked to the patient about the possibility of trying outpatient treatment or AA meetings, but patient does not have access to personal transportation or family/friend help with transportation to outpatient services. CPSS talked to the patient about trying online AA meetings for now and to try following up with residential substance use treatment center list provided by CPSS on Monday. Some of the resources that CPSS provided are residential/outpatient substance use treatment center list, online/in-person AA meeting list information, [employer], and CPSS contact information. CPSS strongly encouraged the patient to follow up with CPSS if needed for further help with getting connected to substance use recovery resources.

## 2019-04-01 NOTE — ED Notes (Signed)
Patient is being dressed out into new [employer] and RN is waiting for security to waned patient before escorting patient to [facility].  Patient belongings have been bagged and labeled.   Patient belongings include 1x cellphone 1x gray jacket 1x charger with cable
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
## 2019-04-01 NOTE — ED Notes (Signed)
Pt discharged home. Discharged instructions read to pt who verbalized understanding. All belongings returned to pt. Denies SI/HI, is not delusional and not responding to internal stimuli. Escorted pt to the ED exit.   

## 2019-04-01 NOTE — ED Notes (Signed)
On admission to the Acute Unit pt is alert, calm and went straight to bed.

## 2019-04-05 ENCOUNTER — Encounter (HOSPITAL_COMMUNITY): Payer: Self-pay | Admitting: Family Medicine

## 2019-04-05 ENCOUNTER — Emergency Department (HOSPITAL_COMMUNITY)
Admission: EM | Admit: 2019-04-05 | Discharge: 2019-04-05 | Disposition: A | Discharge status: Home / Self Care | Payer: Self-pay | Attending: Emergency Medicine | Admitting: Emergency Medicine

## 2019-04-05 ENCOUNTER — Other Ambulatory Visit: Payer: Self-pay

## 2019-04-05 DIAGNOSIS — Z046 Encounter for general psychiatric examination, requested by authority: Secondary | ICD-10-CM | POA: Insufficient documentation

## 2019-04-05 DIAGNOSIS — F1110 Opioid abuse, uncomplicated: Secondary | ICD-10-CM | POA: Insufficient documentation

## 2019-04-05 DIAGNOSIS — F10229 Alcohol dependence with intoxication, unspecified: Secondary | ICD-10-CM | POA: Diagnosis present

## 2019-04-05 DIAGNOSIS — F17210 Nicotine dependence, cigarettes, uncomplicated: Secondary | ICD-10-CM | POA: Insufficient documentation

## 2019-04-05 DIAGNOSIS — F1510 Other stimulant abuse, uncomplicated: Secondary | ICD-10-CM | POA: Insufficient documentation

## 2019-04-05 DIAGNOSIS — R45851 Suicidal ideations: Secondary | ICD-10-CM | POA: Insufficient documentation

## 2019-04-05 DIAGNOSIS — Y908 Blood alcohol level of 240 mg/100 ml or more: Secondary | ICD-10-CM | POA: Insufficient documentation

## 2019-04-05 DIAGNOSIS — F1094 Alcohol use, unspecified with alcohol-induced mood disorder: Secondary | ICD-10-CM | POA: Diagnosis present

## 2019-04-05 DIAGNOSIS — F332 Major depressive disorder, recurrent severe without psychotic features: Secondary | ICD-10-CM | POA: Insufficient documentation

## 2019-04-05 DIAGNOSIS — Z630 Problems in relationship with spouse or partner: Secondary | ICD-10-CM

## 2019-04-05 DIAGNOSIS — F1024 Alcohol dependence with alcohol-induced mood disorder: Secondary | ICD-10-CM | POA: Insufficient documentation

## 2019-04-05 DIAGNOSIS — F1020 Alcohol dependence, uncomplicated: Secondary | ICD-10-CM | POA: Insufficient documentation

## 2019-04-05 LAB — CBC
HCT: 36.8 % (ref 36.0–46.0)
Hemoglobin: 11.1 g/dL — ABNORMAL LOW (ref 12.0–15.0)
MCH: 33.2 pg (ref 26.0–34.0)
MCHC: 30.2 g/dL (ref 30.0–36.0)
MCV: 110.2 fL — ABNORMAL HIGH (ref 80.0–100.0)
Platelets: 382 10*3/uL (ref 150–400)
RBC: 3.34 MIL/uL — ABNORMAL LOW (ref 3.87–5.11)
RDW: 13.9 % (ref 11.5–15.5)
WBC: 3.7 10*3/uL — ABNORMAL LOW (ref 4.0–10.5)
nRBC: 0 % (ref 0.0–0.2)

## 2019-04-05 LAB — POC URINE PREG, ED: Preg Test, Ur: NEGATIVE

## 2019-04-05 LAB — COMPREHENSIVE METABOLIC PANEL
ALT: 22 U/L (ref 0–44)
AST: 32 U/L (ref 15–41)
Albumin: 3.8 g/dL (ref 3.5–5.0)
Alkaline Phosphatase: 77 U/L (ref 38–126)
Anion gap: 9 (ref 5–15)
BUN: 8 mg/dL (ref 6–20)
CO2: 23 mmol/L (ref 22–32)
Calcium: 8.5 mg/dL — ABNORMAL LOW (ref 8.9–10.3)
Chloride: 104 mmol/L (ref 98–111)
Creatinine, Ser: 0.62 mg/dL (ref 0.44–1.00)
GFR calc Af Amer: >60 mL/min (ref >60)
GFR calc non Af Amer: >60 mL/min (ref >60)
Glucose, Bld: 88 mg/dL (ref 70–99)
Potassium: 4.1 mmol/L (ref 3.5–5.1)
Sodium: 136 mmol/L (ref 135–145)
Total Bilirubin: 0.2 mg/dL — ABNORMAL LOW (ref 0.3–1.2)
Total Protein: 6.2 g/dL — ABNORMAL LOW (ref 6.5–8.1)

## 2019-04-05 LAB — ACETAMINOPHEN LEVEL: Acetaminophen (Tylenol), Serum: <10 ug/mL — ABNORMAL LOW (ref 10–30)

## 2019-04-05 LAB — RAPID URINE DRUG SCREEN, HOSP PERFORMED
Amphetamines: NOT DETECTED
Barbiturates: NOT DETECTED
Benzodiazepines: POSITIVE — AB
Cocaine: NOT DETECTED
Opiates: NOT DETECTED
Tetrahydrocannabinol: NOT DETECTED

## 2019-04-05 LAB — SALICYLATE LEVEL: Salicylate Lvl: <7 mg/dL (ref 2.8–30.0)

## 2019-04-05 LAB — ETHANOL: Alcohol, Ethyl (B): 241 mg/dL — ABNORMAL HIGH (ref <10)

## 2019-04-05 MED ORDER — LORAZEPAM 1 MG PO TABS: 0.0000 mg | ORAL_TABLET | Freq: Two times a day (BID) | ORAL | Status: DC

## 2019-04-05 MED ORDER — ZOLPIDEM TARTRATE 5 MG PO TABS: 5.0000 mg | ORAL_TABLET | Freq: Every evening | ORAL | Status: DC | PRN

## 2019-04-05 MED ORDER — VITAMIN B-1 100 MG PO TABS
100.0000 mg | ORAL_TABLET | Freq: Every day | ORAL | Status: DC
  Administered 2019-04-05: 100 mg via ORAL
  Filled 2019-04-05: qty 1

## 2019-04-05 MED ORDER — LORAZEPAM 2 MG/ML IJ SOLN: 0.0000 mg | Freq: Four times a day (QID) | INTRAMUSCULAR | Status: DC

## 2019-04-05 MED ORDER — LORAZEPAM 2 MG/ML IJ SOLN: 0.0000 mg | Freq: Two times a day (BID) | INTRAMUSCULAR | Status: DC

## 2019-04-05 MED ORDER — NICOTINE 21 MG/24HR TD PT24: 21.0000 mg | MEDICATED_PATCH | Freq: Every day | TRANSDERMAL | Status: DC

## 2019-04-05 MED ORDER — LORAZEPAM 1 MG PO TABS: 0.0000 mg | ORAL_TABLET | Freq: Four times a day (QID) | ORAL | Status: DC

## 2019-04-05 MED ORDER — THIAMINE HCL 100 MG/ML IJ SOLN: 100.0000 mg | Freq: Every day | INTRAMUSCULAR | Status: DC

## 2019-04-05 MED ORDER — IBUPROFEN 200 MG PO TABS: 600.0000 mg | ORAL_TABLET | Freq: Three times a day (TID) | ORAL | Status: DC | PRN
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
## 2019-04-05 NOTE — ED Notes (Signed)
Pt discharged home per MD order. Discharge summary reviewed, pt verbalizes understanding. Denies SI/HI/AVH. Personal property returned, discharged home with wife. Ambulatory off unit. Pt signed e-signature.  Voicing no complaints at discharge.

## 2019-04-05 NOTE — ED Notes (Addendum)
Gave report to Kiristin, Therapist, sports for [employer].
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
## 2019-04-05 NOTE — ED Notes (Signed)
Belongings placed in locker 28 
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
## 2019-04-05 NOTE — ED Notes (Signed)
TTS consult in process. 
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
## 2019-05-09 ENCOUNTER — Other Ambulatory Visit: Payer: Self-pay

## 2019-05-09 ENCOUNTER — Encounter (HOSPITAL_COMMUNITY): Payer: Self-pay | Admitting: *Deleted

## 2019-05-09 ENCOUNTER — Emergency Department (HOSPITAL_COMMUNITY)
Admission: EM | Admit: 2019-05-09 | Discharge: 2019-05-09 | Disposition: A | Discharge status: Home / Self Care | Payer: Self-pay | Attending: Emergency Medicine | Admitting: Emergency Medicine

## 2019-05-09 DIAGNOSIS — R4182 Altered mental status, unspecified: Secondary | ICD-10-CM | POA: Insufficient documentation

## 2019-05-09 DIAGNOSIS — Y908 Blood alcohol level of 240 mg/100 ml or more: Secondary | ICD-10-CM | POA: Insufficient documentation

## 2019-05-09 DIAGNOSIS — F17210 Nicotine dependence, cigarettes, uncomplicated: Secondary | ICD-10-CM | POA: Insufficient documentation

## 2019-05-09 DIAGNOSIS — R52 Pain, unspecified: Secondary | ICD-10-CM | POA: Insufficient documentation

## 2019-05-09 DIAGNOSIS — F10920 Alcohol use, unspecified with intoxication, uncomplicated: Secondary | ICD-10-CM | POA: Insufficient documentation

## 2019-05-09 LAB — URINALYSIS, COMPLETE (UACMP) WITH MICROSCOPIC
Bilirubin Urine: NEGATIVE
Glucose, UA: NEGATIVE mg/dL
Hgb urine dipstick: NEGATIVE
Ketones, ur: NEGATIVE mg/dL
Leukocytes,Ua: NEGATIVE
Nitrite: NEGATIVE
Protein, ur: NEGATIVE mg/dL
Specific Gravity, Urine: 1.006 (ref 1.005–1.030)
pH: 6 (ref 5.0–8.0)

## 2019-05-09 LAB — COMPREHENSIVE METABOLIC PANEL
ALT: 27 U/L (ref 0–44)
AST: 51 U/L — ABNORMAL HIGH (ref 15–41)
Albumin: 4.3 g/dL (ref 3.5–5.0)
Alkaline Phosphatase: 77 U/L (ref 38–126)
Anion gap: 14 (ref 5–15)
BUN: 6 mg/dL (ref 6–20)
CO2: 22 mmol/L (ref 22–32)
Calcium: 8.7 mg/dL — ABNORMAL LOW (ref 8.9–10.3)
Chloride: 106 mmol/L (ref 98–111)
Creatinine, Ser: 0.72 mg/dL (ref 0.44–1.00)
GFR calc Af Amer: >60 mL/min (ref >60)
GFR calc non Af Amer: >60 mL/min (ref >60)
Glucose, Bld: 92 mg/dL (ref 70–99)
Potassium: 3.9 mmol/L (ref 3.5–5.1)
Sodium: 142 mmol/L (ref 135–145)
Total Bilirubin: 0.5 mg/dL (ref 0.3–1.2)
Total Protein: 6.9 g/dL (ref 6.5–8.1)

## 2019-05-09 LAB — CBC WITH DIFFERENTIAL/PLATELET
Abs Immature Granulocytes: 0.03 10*3/uL (ref 0.00–0.07)
Basophils Absolute: 0 10*3/uL (ref 0.0–0.1)
Basophils Relative: 1 %
Eosinophils Absolute: 0.1 10*3/uL (ref 0.0–0.5)
Eosinophils Relative: 2 %
HCT: 43.3 % (ref 36.0–46.0)
Hemoglobin: 14.6 g/dL (ref 12.0–15.0)
Immature Granulocytes: 1 %
Lymphocytes Relative: 32 %
Lymphs Abs: 1.9 10*3/uL (ref 0.7–4.0)
MCH: 33.9 pg (ref 26.0–34.0)
MCHC: 33.7 g/dL (ref 30.0–36.0)
MCV: 100.5 fL — ABNORMAL HIGH (ref 80.0–100.0)
Monocytes Absolute: 0.2 10*3/uL (ref 0.1–1.0)
Monocytes Relative: 4 %
Neutro Abs: 3.7 10*3/uL (ref 1.7–7.7)
Neutrophils Relative %: 60 %
Platelets: 272 10*3/uL (ref 150–400)
RBC: 4.31 MIL/uL (ref 3.87–5.11)
RDW: 13.5 % (ref 11.5–15.5)
WBC: 6 10*3/uL (ref 4.0–10.5)
nRBC: 0 % (ref 0.0–0.2)

## 2019-05-09 LAB — I-STAT BETA HCG BLOOD, ED (MC, WL, AP ONLY): I-stat hCG, quantitative: <5 m[IU]/mL (ref <5)

## 2019-05-09 LAB — RAPID URINE DRUG SCREEN, HOSP PERFORMED
Amphetamines: NOT DETECTED
Barbiturates: NOT DETECTED
Benzodiazepines: NOT DETECTED
Cocaine: NOT DETECTED
Opiates: NOT DETECTED
Tetrahydrocannabinol: NOT DETECTED

## 2019-05-09 LAB — ETHANOL: Alcohol, Ethyl (B): 446 mg/dL (ref <10)

## 2019-05-09 LAB — CBG MONITORING, ED: Glucose-Capillary: 90 mg/dL (ref 70–99)

## 2019-05-09 MED ORDER — SODIUM CHLORIDE 0.9 % IV BOLUS
1000.0000 mL | Freq: Once | INTRAVENOUS | Status: AC
  Administered 2019-05-09: 1000 mL via INTRAVENOUS

## 2019-05-09 MED ORDER — SODIUM CHLORIDE 0.9 % IV SOLN
INTRAVENOUS | Status: DC
  Administered 2019-05-09: 01:00:00 via INTRAVENOUS

## 2019-05-09 NOTE — ED Provider Notes (Signed)
Emergency Department Provider Note   I have reviewed the triage vital signs and the nursing notes.   HISTORY  Chief Complaint Altered Mental Status and Medical Clearance   HPI Sheila Graves is a 38 y.o. female w/ history as documented below who presents with EMS for being found unresponsive outside of a [employer]. Patient initially responsive to pain with EMS. Normal VS. cbg of 104, improved responsiveness on the way here. Reported odor of ETOH from EMS.  Patient awake here, answers basic questions. States she has pain all over. States she has been drinking. States she hates herself but is not suicidal. Did not state any ingestion aside from alcohol.   No other associated or modifying symptoms.   Level V Caveat Secondary to apparent intoxication  Past Medical History:  Diagnosis Date  . Alcohol withdrawal seizure ([place])   . ETOH abuse   . Hypokalemia   . Pancreatitis   . Thyroid disease     Patient Active Problem List   Diagnosis Date Noted  . 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 Marital conflict 54/65/0354  . Alcohol withdrawal syndrome with complication, with unspecified complication ([place]) 65/68/1275  . Acute metabolic encephalopathy 17/00/1749  . Rhabdomyolysis 03/21/2019  . Alcohol use disorder, severe, dependence ([place])   . Prolonged QT interval 03/16/2019  . Metabolic acidosis 44/96/7591  . AKI (acute kidney injury) ([place])   . Transaminitis   . Dehydration 03/15/2019  . Alcohol intoxication with moderate or severe use disorder ([place]) 02/04/2019  . Hypokalemia 02/04/2019  . Hypothermia 02/04/2019  . Hypotension 02/04/2019  . Fracture of clavicle, right, open 11/11/2018  . Major depressive disorder, recurrent, severe without psychotic features ([place]) 01/31/2018    Past Surgical History:  Procedure Laterality Date  . FINGER SURGERY    . IRRIGATION AND DEBRIDEMENT SHOULDER Right 11/12/2018   Procedure: Irrigation And Debridement Shoulder;  Surgeon:  Shona Needles, MD;  Location: [facility];  Service: Orthopedics;  Laterality: Right;  . ORIF CLAVICULAR FRACTURE Right 11/12/2018   Procedure: OPEN REDUCTION INTERNAL FIXATION (ORIF) CLAVICULAR FRACTURE;  Surgeon: Shona Needles, MD;  Location: [facility];  Service: Orthopedics;  Laterality: Right;    Current Outpatient Rx  . Order #: [id number] Class: Print  . Order #: [id number] Class: Normal    Allergies Patient has no known allergies.  Family History  Problem Relation Age of Onset  . Alcohol abuse Other     Social History Social History   Tobacco Use  . Smoking status: Current Every Day Smoker    Packs/day: 1.00  . Smokeless tobacco: Never Used  Substance Use Topics  . Alcohol use: Yes    Comment: Last drink: 7pm   . Drug use: Not Currently    Types: Heroin    Review of Systems  Level V Caveat Secondary to apparent intoxication ____________________________________________   PHYSICAL EXAM:  VITAL SIGNS: Vitals:   05/09/19 0315 05/09/19 0400 05/09/19 0500 05/09/19 0812  BP: (!) 86/65 (!) 85/59 98/66 (!) 90/58  Pulse: 84 93 87 80  Resp: 14 19 20 13   Temp:    97.8 F (36.6 C)  TempSrc:    Axillary  SpO2: 99% 100% 99% 99%     Constitutional: Alert and oriented. Well appearing and in no acute distress. Eyes: Conjunctivae are normal. PERRL. EOMI. Head: Atraumatic. Nose: No congestion/rhinnorhea. Mouth/Throat: Mucous membranes are moist.  Oropharynx non-erythematous. Neck: No stridor.  No meningeal signs.   Cardiovascular: Normal rate, regular rhythm. Good peripheral circulation. Grossly normal  heart sounds.   Respiratory: Normal respiratory effort.  No retractions. Lungs CTAB. Gastrointestinal: Soft and nontender. No distention.  Musculoskeletal: No lower extremity tenderness nor edema. No gross deformities of extremities. Neurologic:  Normal speech and language. No gross focal neurologic deficits are appreciated.  Skin:  Skin is warm, dry and intact. No rash noted.    ____________________________________________   LABS (all labs ordered are listed, but only abnormal results are displayed)  Labs Reviewed  COMPREHENSIVE METABOLIC PANEL - Abnormal; Notable for the following components:      Result Value   Calcium 8.7 (*)    AST 51 (*)    All other components within normal limits  ETHANOL - Abnormal; Notable for the following components:   Alcohol, Ethyl (B) 446 (*)    All other components within normal limits  CBC WITH DIFFERENTIAL/PLATELET - Abnormal; Notable for the following components:   MCV 100.5 (*)    All other components within normal limits  URINALYSIS, COMPLETE (UACMP) WITH MICROSCOPIC - Abnormal; Notable for the following components:   Color, Urine STRAW (*)    Bacteria, UA RARE (*)    All other components within normal limits  URINE CULTURE  RAPID URINE DRUG SCREEN, HOSP PERFORMED  CBG MONITORING, ED  I-STAT BETA HCG BLOOD, ED ([facility], [facility], [facility] ONLY)   ____________________________________________  EKG   EKG Interpretation  Date/Time:  Tuesday May 09 2019 00:35:38 EST Ventricular Rate:  85 PR Interval:    QRS Duration: 89 QT Interval:  390 QTC Calculation: 464 R Axis:   74 Text Interpretation: Sinus rhythm Low voltage, precordial leads No acute changes Confirmed by Marily Memos [phone]) on 05/09/2019 1:29:07 AM       ____________________________________________  RADIOLOGY  No results found.  ____________________________________________   PROCEDURES  Procedure(s) performed:   Procedures   ____________________________________________   INITIAL IMPRESSION / ASSESSMENT AND PLAN / ED COURSE  Will screen with labs/ecg. No e/o trauma to suggest need for imaging. Will allow her to sober up and decide if she needs TTS/psychiatric workup or not.   Reassessed. Still seems slightly intoxicated. Still denying SI/HI. Will allow to continue to metabolize.   Seems clinically sober. Denying psych complaints. DC.   Pertinent labs & imaging results that were available during my care of the patient were reviewed by me and considered in my medical decision making (see chart for details).  ____________________________________________  FINAL CLINICAL IMPRESSION(S) / ED DIAGNOSES  Final diagnoses:  Altered mental status, unspecified altered mental status type  Alcoholic intoxication without complication (HCC)     MEDICATIONS GIVEN DURING THIS VISIT:  Medications  sodium chloride 0.9 % bolus 1,000 mL (0 mLs Intravenous Stopped 05/09/19 0332)     NEW OUTPATIENT MEDICATIONS STARTED DURING THIS VISIT:  Discharge Medication List as of 05/09/2019  7:52 AM      Note:  This note was prepared with assistance of Dragon voice recognition software. Occasional wrong-word or sound-a-like substitutions may have occurred due to the inherent limitations of voice recognition software.   Marily Memos, MD 05/09/19 2328
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
## 2019-05-09 NOTE — ED Notes (Signed)
NT attempted to ambulate pt; pt requesting to sleep longer because she does not feel she can walk at this time. Pt much more alert at present

## 2019-05-09 NOTE — ED Notes (Signed)
Notified security to notify [employer] pt was being d/c'd. [employer] couldn't give an ETA so I had to D/C pt.

## 2019-05-10 LAB — URINE CULTURE: Culture: <10000 — AB

## 2019-06-06 ENCOUNTER — Other Ambulatory Visit: Payer: Self-pay

## 2019-06-06 ENCOUNTER — Emergency Department (HOSPITAL_COMMUNITY)
Admission: EM | Admit: 2019-06-06 | Discharge: 2019-06-07 | Disposition: A | Discharge status: Home / Self Care | Payer: Self-pay | Attending: Emergency Medicine | Admitting: Emergency Medicine

## 2019-06-06 ENCOUNTER — Emergency Department (HOSPITAL_COMMUNITY): Payer: Self-pay

## 2019-06-06 ENCOUNTER — Encounter (HOSPITAL_COMMUNITY): Payer: Self-pay

## 2019-06-06 DIAGNOSIS — F17210 Nicotine dependence, cigarettes, uncomplicated: Secondary | ICD-10-CM | POA: Insufficient documentation

## 2019-06-06 DIAGNOSIS — R4182 Altered mental status, unspecified: Secondary | ICD-10-CM | POA: Insufficient documentation

## 2019-06-06 DIAGNOSIS — F10920 Alcohol use, unspecified with intoxication, uncomplicated: Secondary | ICD-10-CM | POA: Insufficient documentation

## 2019-06-06 LAB — COMPREHENSIVE METABOLIC PANEL
ALT: 16 U/L (ref 0–44)
AST: 28 U/L (ref 15–41)
Albumin: 4.1 g/dL (ref 3.5–5.0)
Alkaline Phosphatase: 62 U/L (ref 38–126)
Anion gap: 11 (ref 5–15)
BUN: 8 mg/dL (ref 6–20)
CO2: 26 mmol/L (ref 22–32)
Calcium: 9 mg/dL (ref 8.9–10.3)
Chloride: 104 mmol/L (ref 98–111)
Creatinine, Ser: 0.64 mg/dL (ref 0.44–1.00)
GFR calc Af Amer: >60 mL/min (ref >60)
GFR calc non Af Amer: >60 mL/min (ref >60)
Glucose, Bld: 87 mg/dL (ref 70–99)
Potassium: 4.5 mmol/L (ref 3.5–5.1)
Sodium: 141 mmol/L (ref 135–145)
Total Bilirubin: 0.3 mg/dL (ref 0.3–1.2)
Total Protein: 7.2 g/dL (ref 6.5–8.1)

## 2019-06-06 LAB — CBC WITH DIFFERENTIAL/PLATELET
Abs Immature Granulocytes: 0 10*3/uL (ref 0.00–0.07)
Basophils Absolute: 0 10*3/uL (ref 0.0–0.1)
Basophils Relative: 0 %
Eosinophils Absolute: 0 10*3/uL (ref 0.0–0.5)
Eosinophils Relative: 0 %
HCT: 41.5 % (ref 36.0–46.0)
Hemoglobin: 14.2 g/dL (ref 12.0–15.0)
Immature Granulocytes: 0 %
Lymphocytes Relative: 50 %
Lymphs Abs: 2.2 10*3/uL (ref 0.7–4.0)
MCH: 34.1 pg — ABNORMAL HIGH (ref 26.0–34.0)
MCHC: 34.2 g/dL (ref 30.0–36.0)
MCV: 99.5 fL (ref 80.0–100.0)
Monocytes Absolute: 0.4 10*3/uL (ref 0.1–1.0)
Monocytes Relative: 8 %
Neutro Abs: 1.9 10*3/uL (ref 1.7–7.7)
Neutrophils Relative %: 42 %
Platelets: 222 10*3/uL (ref 150–400)
RBC: 4.17 MIL/uL (ref 3.87–5.11)
RDW: 12.6 % (ref 11.5–15.5)
WBC: 4.5 10*3/uL (ref 4.0–10.5)
nRBC: 0 % (ref 0.0–0.2)

## 2019-06-06 LAB — I-STAT BETA HCG BLOOD, ED (MC, WL, AP ONLY): I-stat hCG, quantitative: <5 m[IU]/mL (ref <5)

## 2019-06-06 LAB — LIPASE, BLOOD: Lipase: 30 U/L (ref 11–51)

## 2019-06-06 LAB — CBG MONITORING, ED: Glucose-Capillary: 77 mg/dL (ref 70–99)

## 2019-06-06 LAB — ETHANOL: Alcohol, Ethyl (B): 371 mg/dL (ref <10)

## 2019-06-06 NOTE — ED Notes (Signed)
Unsuccessful blood drawl x2.

## 2019-06-06 NOTE — Discharge Instructions (Addendum)
Please avoid further alcohol consumption.  You would benefit from substance abuse counseling.  I strongly encourage you to follow-up with outpatient resources.  Please read the attachment detailing substance abuse resources.

## 2019-06-06 NOTE — ED Notes (Signed)
Phlebotomy called for lab draw. EDP made aware of difficult stick

## 2019-06-06 NOTE — ED Notes (Signed)
Sheila Graves married significant other called would like to be kept updated with her condition [phone]. States she is will to come pick pt up when she is medically cleared
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
## 2019-06-06 NOTE — ED Notes (Signed)
Called lab regarding pts lab work drew by phlebotomy. Lab looking for blood.
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
## 2019-06-07 NOTE — ED Notes (Signed)
Pt talking on cellphone

## 2019-06-07 NOTE — ED Notes (Signed)
Pt yelling at staff to remove pants. Staff attempted to assist pt, pt then removed pants on her own and kicked them to the end of the bed and would not allow staff to place them in a bag.
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
## 2019-06-07 NOTE — ED Notes (Addendum)
Pt ambulatory to the bathroom with steady gait and without assistance Pt verbally abusive towards staff

## 2019-06-11 ENCOUNTER — Encounter (HOSPITAL_COMMUNITY): Payer: Self-pay | Admitting: *Deleted

## 2019-06-11 ENCOUNTER — Other Ambulatory Visit: Payer: Self-pay

## 2019-06-11 ENCOUNTER — Emergency Department (HOSPITAL_COMMUNITY)
Admission: EM | Admit: 2019-06-11 | Discharge: 2019-06-12 | Disposition: A | Discharge status: Home / Self Care | Payer: Self-pay | Attending: Emergency Medicine | Admitting: Emergency Medicine

## 2019-06-11 DIAGNOSIS — F17200 Nicotine dependence, unspecified, uncomplicated: Secondary | ICD-10-CM | POA: Insufficient documentation

## 2019-06-11 DIAGNOSIS — Z008 Encounter for other general examination: Secondary | ICD-10-CM

## 2019-06-11 DIAGNOSIS — F10920 Alcohol use, unspecified with intoxication, uncomplicated: Secondary | ICD-10-CM | POA: Insufficient documentation

## 2019-06-11 DIAGNOSIS — E876 Hypokalemia: Secondary | ICD-10-CM | POA: Insufficient documentation

## 2019-06-11 LAB — CBC WITH DIFFERENTIAL/PLATELET
Abs Immature Granulocytes: 0.01 10*3/uL (ref 0.00–0.07)
Basophils Absolute: 0 10*3/uL (ref 0.0–0.1)
Basophils Relative: 1 %
Eosinophils Absolute: 0 10*3/uL (ref 0.0–0.5)
Eosinophils Relative: 0 %
HCT: 38.6 % (ref 36.0–46.0)
Hemoglobin: 12.7 g/dL (ref 12.0–15.0)
Immature Granulocytes: 0 %
Lymphocytes Relative: 50 %
Lymphs Abs: 1.8 10*3/uL (ref 0.7–4.0)
MCH: 34 pg (ref 26.0–34.0)
MCHC: 32.9 g/dL (ref 30.0–36.0)
MCV: 103.5 fL — ABNORMAL HIGH (ref 80.0–100.0)
Monocytes Absolute: 0.3 10*3/uL (ref 0.1–1.0)
Monocytes Relative: 8 %
Neutro Abs: 1.5 10*3/uL — ABNORMAL LOW (ref 1.7–7.7)
Neutrophils Relative %: 41 %
Platelets: 222 10*3/uL (ref 150–400)
RBC: 3.73 MIL/uL — ABNORMAL LOW (ref 3.87–5.11)
RDW: 12.3 % (ref 11.5–15.5)
WBC: 3.7 10*3/uL — ABNORMAL LOW (ref 4.0–10.5)
nRBC: 0 % (ref 0.0–0.2)

## 2019-06-11 NOTE — ED Provider Notes (Signed)
[facility] DEPT Provider Note   CSN: [id number] Arrival date & time: 06/11/19  2051     History No chief complaint on file.   Sheila Graves is a 38 y.o. female.  HPI      Patient brought in by law enforcement from a local department store where she was found altered and felt to be intoxicated.  She was given a citation and left here by local enforcement.  She is unable to contribute to history.  Level 5 caveat-altered mental status  Past Medical History:  Diagnosis Date  . Alcohol withdrawal seizure ([place])   . ETOH abuse   . Hypokalemia   . Pancreatitis   . Thyroid disease     Patient Active Problem List   Diagnosis Date Noted  . 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 Marital conflict 76/81/1572  . Alcohol withdrawal syndrome with complication, with unspecified complication ([place]) 62/08/5595  . Acute metabolic encephalopathy 41/63/8453  . Rhabdomyolysis 03/21/2019  . Alcohol use disorder, severe, dependence ([place])   . Prolonged QT interval 03/16/2019  . Metabolic acidosis 64/68/0321  . AKI (acute kidney injury) ([place])   . Transaminitis   . Dehydration 03/15/2019  . Alcohol intoxication with moderate or severe use disorder ([place]) 02/04/2019  . Hypokalemia 02/04/2019  . Hypothermia 02/04/2019  . Hypotension 02/04/2019  . Fracture of clavicle, right, open 11/11/2018  . Major depressive disorder, recurrent, severe without psychotic features ([place]) 01/31/2018    Past Surgical History:  Procedure Laterality Date  . FINGER SURGERY    . IRRIGATION AND DEBRIDEMENT SHOULDER Right 11/12/2018   Procedure: Irrigation And Debridement Shoulder;  Surgeon: Shona Needles, MD;  Location: [facility];  Service: Orthopedics;  Laterality: Right;  . ORIF CLAVICULAR FRACTURE Right 11/12/2018   Procedure: OPEN REDUCTION INTERNAL FIXATION (ORIF) CLAVICULAR FRACTURE;  Surgeon: Shona Needles, MD;  Location: [facility];  Service: Orthopedics;  Laterality: Right;      OB History   No obstetric history on file.     Family History  Problem Relation Age of Onset  . Alcohol abuse Other     Social History   Tobacco Use  . Smoking status: Current Every Day Smoker    Packs/day: 1.00  . Smokeless tobacco: Never Used  Substance Use Topics  . Alcohol use: Yes    Comment: Last drink: 7pm   . Drug use: Not Currently    Types: Heroin    Home Medications Prior to Admission medications   Medication Sig Start Date End Date Taking? Authorizing Provider  escitalopram (LEXAPRO) 10 MG tablet Take 1 tablet (10 mg total) by mouth daily. 03/22/19   Dhungel, Flonnie Overman, MD  meloxicam (MOBIC) 7.5 MG tablet Take 1 tablet (7.5 mg total) by mouth daily. 03/23/19   Ok Edwards, PA-C    Allergies    Patient has no known allergies.  Review of Systems   Review of Systems  Unable to perform ROS: Mental status change    Physical Exam Updated Vital Signs BP 107/78 (BP Location: Left Arm)   Pulse 89   Temp (!) 97.5 F (36.4 C) (Oral)   Resp 18   SpO2 99%   Physical Exam Vitals and nursing note reviewed.  Constitutional:      General: She is in acute distress.     Appearance: She is well-developed. She is ill-appearing. She is not toxic-appearing or diaphoretic.  HENT:     Head: Normocephalic and atraumatic.  Eyes:     Conjunctiva/sclera:  Conjunctivae normal.     Pupils: Pupils are equal, round, and reactive to light.  Neck:     Trachea: Phonation normal.  Cardiovascular:     Rate and Rhythm: Normal rate and regular rhythm.  Pulmonary:     Effort: Pulmonary effort is normal.     Breath sounds: Normal breath sounds.  Chest:     Chest wall: No tenderness.  Abdominal:     General: There is no distension.     Palpations: Abdomen is soft.     Tenderness: There is no abdominal tenderness. There is no guarding.  Musculoskeletal:        General: No swelling, tenderness or signs of injury. Normal range of motion.     Cervical back: Normal range of motion  and neck supple.  Skin:    General: Skin is warm and dry.     Coloration: Skin is not jaundiced or pale.  Neurological:     Mental Status: She is alert.     Motor: No abnormal muscle tone.     Comments: Slurred speech, consistent with alcohol intoxication.  No aphasia.  She is a reluctant historian.  She moves all extremities equally.  Psychiatric:     Comments: Obtunded     ED Results / Procedures / Treatments   Labs (all labs ordered are listed, but only abnormal results are displayed) Labs Reviewed  COMPREHENSIVE METABOLIC PANEL  ETHANOL  CBC WITH DIFFERENTIAL/PLATELET  RAPID URINE DRUG SCREEN, HOSP PERFORMED  BLOOD GAS, VENOUS    EKG None  Radiology No results found.  Procedures Procedures (including critical care time)  Medications Ordered in ED Medications - No data to display  ED Course  I have reviewed the triage vital signs and the nursing notes.  Pertinent labs & imaging results that were available during my care of the patient were reviewed by me and considered in my medical decision making (see chart for details).    MDM Rules/Calculators/A&P     CHA2DS2/VAS Stroke Risk Points      N/A >= 2 Points: High Risk  1 - 1.99 Points: Medium Risk  0 Points: Low Risk    A final score could not be computed because of missing components.: Last  Change: N/A     This score determines the patient's risk of having a stroke if the  patient has atrial fibrillation.      This score is not applicable to this patient. Components are not  calculated.                    Patient Vitals for the past 24 hrs:  BP Temp Temp src Pulse Resp SpO2  06/11/19 2130 107/78 (!) 97.5 F (36.4 C) Oral 89 18 99 %  06/11/19 2101 -- -- -- -- -- 100 %   Labs ordered, will observe, and consider further care and/or disposition.  Medical Decision Making: Patient presenting with suspected alcohol intoxication.  Vital signs are reassuring.  She is able to respond however is confused and  dysarthric.  Doubt CVA as she is moving all extremities.  No indication of head trauma.  No indication of extremity or torso trauma.  Sheila Graves was evaluated in Emergency Department on 06/11/2019 for the symptoms described in the history of present illness. She was evaluated in the context of the global COVID-19 pandemic, which necessitated consideration that the patient might be at risk for infection with the SARS-CoV-2 virus that causes COVID-19. Institutional protocols and algorithms that  pertain to the evaluation of patients at risk for COVID-19 are in a state of rapid change based on information released by regulatory bodies including the CDC and federal and state organizations. These policies and algorithms were followed during the patient's care in the ED.  CRITICAL CARE-no Performed by: Mancel Bale  Nursing Notes Reviewed/ Care Coordinated Applicable Imaging Reviewed Interpretation of Laboratory Data incorporated into ED treatment  Plan-observe till sober, then consider disposition.  Final Clinical Impression(s) / ED Diagnoses Final diagnoses:  Alcoholic intoxication without complication [facility])    Rx / DC Orders ED Discharge Orders    None       Mancel Bale, MD 06/11/19 2307

## 2019-06-11 NOTE — ED Triage Notes (Signed)
Pt BIB EMS and presents after picked up at [employer].  Pt was seen walking around the grocery store drinking wine.  Staff asked the pt to leave and pt laid on the ground in protest.  EMS was called and pt is here for evaluation.

## 2019-06-12 LAB — COMPREHENSIVE METABOLIC PANEL
ALT: 15 U/L (ref 0–44)
AST: 28 U/L (ref 15–41)
Albumin: 3.9 g/dL (ref 3.5–5.0)
Alkaline Phosphatase: 59 U/L (ref 38–126)
Anion gap: 15 (ref 5–15)
BUN: 7 mg/dL (ref 6–20)
CO2: 23 mmol/L (ref 22–32)
Calcium: 8.7 mg/dL — ABNORMAL LOW (ref 8.9–10.3)
Chloride: 103 mmol/L (ref 98–111)
Creatinine, Ser: 0.53 mg/dL (ref 0.44–1.00)
GFR calc Af Amer: >60 mL/min (ref >60)
GFR calc non Af Amer: >60 mL/min (ref >60)
Glucose, Bld: 93 mg/dL (ref 70–99)
Potassium: 3 mmol/L — ABNORMAL LOW (ref 3.5–5.1)
Sodium: 141 mmol/L (ref 135–145)
Total Bilirubin: 0.4 mg/dL (ref 0.3–1.2)
Total Protein: 6.6 g/dL (ref 6.5–8.1)

## 2019-06-12 LAB — ETHANOL
Alcohol, Ethyl (B): 401 mg/dL (ref <10)
Alcohol, Ethyl (B): <10 mg/dL (ref <10)

## 2019-06-12 MED ORDER — POTASSIUM CHLORIDE CRYS ER 20 MEQ PO TBCR: 40.0000 meq | EXTENDED_RELEASE_TABLET | Freq: Once | ORAL | Status: DC

## 2019-06-12 NOTE — ED Notes (Signed)
During discharge patient requesting to stay and call her wife again. This nurse explained that we have talked to her wife throughout the night and she is aware of the plan, that she is more than welcome to call her after but we still need to wake up and continue with discharge. Patient upset stating we would not charge her phone so she could not call her wife, this nurse redirected patient that she can use the phone in the lobby or room as she has been using it throughout the night. Patient became upset and yelling stating "I know I am here all the time but you do not have to be a bitch"

## 2019-06-12 NOTE — Discharge Instructions (Signed)
Substance Abuse Treatment Programs ° °Intensive Outpatient Programs °[facility]     °[address]      °[place], [place]                   °[phone]      ° °[facility] °[address] °[place], [place] °[phone] ° °[facility]     °(Inpatient and outpatient)     °[address]           °[phone]   ° °[facility] °[phone] (Suboxone and Methadone) ° °[address]      °[place], [place] [address]      °336-882-2125      ° °[address] °[place], [place] °852-3033 ° °[facility] (Outpatient/Inpatient, [facility])    °(insurance only) [phone]      °       °[facility] (Groups & Residential) °[place], [place] °[phone] ° °   °[facility]     °[address]     °[place], [place]      °[phone]      ° °[facility] (for caregivers and family) °[address] °[place], [place] °336-299-4655 ° ° ° ° ° °Residential Treatment Programs °[facility]      °[address], [place], [place] [address]  °[phone]      ° °[facility] °[address] [place], [place] [address] °[phone] ° °[facility]        °[phone]      ° °[facility] °1-800-659-3381 ° °[facility]             °[address]                                         °[place], [place]                                                °877-615-2722 or 336-784-9470                              ° °[facility] [place] °[address] °[place], [address] °1.877.941.8954 ° °[facility]    °[address]      °[place], [place]     °[phone]      ° °[facility] °[address] °, [place] °336-285-9073 ° °[facility]   °[address]     °[place] [address]     °[phone]      °Admissions: 8am-3pm M-F ° °[facility] °[address] °[place],  [place] °336-227-7417 ° °[facility]: Residential Program (90 Days)   °[place], [place]      °336-725-8389 or 800-758-6077    ° °[facility] °[place] °(Walk in Hours over the weekend or by referral) ° °[facility] °[address], [place],  [address] °([phone] ° °Crisis Mobile: [facility]:  1-877-626-1772 (for crisis response 24 hours a day) °[facility] Hotline:      1-800-256-2452 °Outpatient Psychiatry and Counseling ° °[facility]: Mobile Crisis   Management 24 hours:  1-877-626-1772 ° °[facility] sliding scale fee and walk in schedule: M-F 8am-12pm/1pm-3pm °[address]  °[place], [place] [address] °[phone] ° °[facility] °[address] °[place], [place] [address] °336-703-9650 ° °[facility] ([facility] known [facility])- new patient walk-in appointments available Monday - Friday 8am -3pm.          °[address] °[place], [place] [address] °336-676-6840 or crisis line- 336-676-6905 ° °[facility]/ Intensive Outpatient Therapy Program °[address] °[place], [place] [address] °336-832-9804 ° °[facility]                  °Crisis Services      °[phone]      °[address]     °[place], [place] [address]                ° °[facility]   °[facility] °[phone] °[address] °[place], [place] [address] ° ° °[facility]          °[address],  °[place], [place] [address]       °([phone] ° °[facility] °[address] °[place], [place] [address] °336-292-1510 ° °[facility]    °[address]    °[place], [place] [address]     °[phone]      ° °Parish McKinney, MD     °[address]     °[place] [address]     °336-282-1251     °  °[facility] °[address] °[place] [address] ° °[facility]     °[address]     °[place], [place]      °336-542-2076      ° °[facility] °Shamsher Ahluwalia, MD °[address] °[place], [place] [address] °[phone] ° °[facility]     °[address]     °[place], [place] [address]     °336-274-1237      ° °[facility] °[address] °[place], [place] [address] °336-854-4450 °Resources for Temporary Residential Assistance/Crisis Centers ° °DAY CENTERS °[facility] °M-F 8am-3pm   °[address] [place], [place] [address]   336-332-0824 °Services include: laundry, barbering, support groups, case management, phone  & computer access, showers, AA/NA mtgs, mental health/substance abuse nurse, job skills class, disability information, VA assistance, spiritual classes, etc.  ° °HOMELESS SHELTERS ° °[facility]     °[facility]   °[address], [place]     °[phone]       °       °[facility] (women and children)       °[address] °, [place] [address] °336-275-0820 °[email or web address] for application and process °Application Required ° °[facility]   °[address]    °[place] [address]     °[phone]       °             °[facility] °[address] °, [place] [address] °336.273.5572 °336-235-0363(schedule application appt.) °Application Required ° °[facility] (women only)    °[address]     °[place],  [address]     °336-884-1039      °  Intake starts 6pm daily °Need valid ID, SSC, & Police report °[facility] [place] °[address] °[place], [place] °336-881-5420 °Application Required ° °[facility] (men only)     °[address]      °[place], [place]     °[phone]      ° °[facility] °(Pregnant women only) °[address] °[place], [place] °336-275-0206 ° °[facility]      °[address]      °[place], [place] [address]     °[phone]      °       °[facility] °[address] °[place], [place] °336-723-1848 °90 day commitment/SA/Application process ° °[facility](men only)     °[address]     °[place], [place]     °[phone]       °Check-in at 7pm     °       °[facility] °[address] °[place], [place] [address] °336-248-6684 °Men/Women/Women and Children must be there by 7 pm ° °[facility] °[place], [place] °336-722-8721                ° °

## 2019-06-12 NOTE — ED Notes (Signed)
Pt calling wife repeatedly telling her that she wants to leave and go to detox facility in [facility]. Wife called staff stating that she had arranged for pt to go to desired facility via [facility] service. Pt was allowed to stay in current room until her ride arrived.

## 2019-06-12 NOTE — ED Notes (Signed)
Pt yelling out the door to staff demanding to know what's going on. Informed pt that her behavior is inappropriate and would not be allowed. Informed pt that she will be allowed to leave as soon as we can confirm she has a safe ride home. Pt continued to yell over Probation officer when Probation officer attempted to explain pt specific situation. Writer exited room.

## 2019-06-12 NOTE — ED Provider Notes (Signed)
Patient stable.  Seen on the phone in no acute distress.  Mild hypokalemia, potassium ordered She will be discharged home   Ripley Fraise, MD 06/12/19 0106

## 2019-06-12 NOTE — ED Notes (Signed)
When discharging pt, pt requested to stay in room and call her wife. Pt was informed by fellow nurse that her wife has been contacted and is aware of her plan of care. Writer told pt that her wife is aware that she will be discharged as soon as the buses are operating. Pt began raising her voice still requesting to stay in the room and call her wife. Fellow nurse informed pt there is a phone in the lobby that she may use. Pt was also reminded that she had been using such phone earlier. Pt became defensive stating "I know I am here all the time, but you don't have to be a bitch." Writer told pt her behaviors were not necessary and requested that pt stop using profanity towards staff. Pt refused to take discharge papers with her when leaving room. While walking to lobby, pt continued to call fellow nurse a "bitch". Writer told pt "That is enough." Opened door to lobby for pt and asked pt to continue walking towards phone in lobby. Pt continued to call staff "bitch" as she walked into lobby. Writer then called security asking them to make sure pt was able to use phone and then see that she made it safely to the bus stop.

## 2019-06-12 NOTE — ED Notes (Signed)
Pts wife informed staff that pt would not be getting a ride via [employer] due to financial complications. Writer spoke to [facility] and confirmed pt could stay in current room until buses start operating. Explained to [facility] that due to pts unpredictable behaviors, it did not seem appropriate to put her in a cab.

## 2019-06-12 NOTE — ED Notes (Signed)
Patient in lobby stating she needs a cab voucher that can not take the city bus, Agricultural consultant speaking with patient at this time. [place]

## 2019-06-12 NOTE — ED Notes (Signed)
Pt returned to lobby at approx 0619 requesting a cab voucher. Charge RN spoke with pt and provided her with previously refused AVS.

## 2019-06-12 NOTE — ED Notes (Signed)
Pt intentionally urinated in the floor then began yelling at staff that they need to clean it up. Writer gave pt towels to clean up her urine.

## 2019-06-12 NOTE — ED Notes (Signed)
Patient refused to get up and use the restroom, patient intentionally urinated on the floor. Patient continues to yell at staff stating that she is suing [place] and we better not call anyone on her contact list and that if we do she will sue. Patient using inappropriate language in triage. Attempted to verbally deescalate but patient continues to walk out into hallway cursing at staff. [employer] and security standing by.

## 2019-06-12 NOTE — ED Notes (Signed)
Patient called wife from phone, patient has given permission to update wife reguarding transport home. Wife asked this nurse if we could let her stay overnight, this nurse explained our policies and that we would keep her updated as much as possible.

## 2019-07-17 ENCOUNTER — Other Ambulatory Visit: Payer: Self-pay

## 2019-07-17 ENCOUNTER — Emergency Department (HOSPITAL_COMMUNITY): Payer: Self-pay

## 2019-07-17 ENCOUNTER — Emergency Department (HOSPITAL_COMMUNITY)
Admission: EM | Admit: 2019-07-17 | Discharge: 2019-07-18 | Disposition: A | Discharge status: Home / Self Care | Payer: Self-pay | Attending: Emergency Medicine | Admitting: Emergency Medicine

## 2019-07-17 ENCOUNTER — Encounter (HOSPITAL_COMMUNITY): Payer: Self-pay

## 2019-07-17 DIAGNOSIS — Y929 Unspecified place or not applicable: Secondary | ICD-10-CM | POA: Insufficient documentation

## 2019-07-17 DIAGNOSIS — F10920 Alcohol use, unspecified with intoxication, uncomplicated: Secondary | ICD-10-CM

## 2019-07-17 DIAGNOSIS — W109XXA Fall (on) (from) unspecified stairs and steps, initial encounter: Secondary | ICD-10-CM | POA: Insufficient documentation

## 2019-07-17 DIAGNOSIS — Z79899 Other long term (current) drug therapy: Secondary | ICD-10-CM | POA: Insufficient documentation

## 2019-07-17 DIAGNOSIS — Y999 Unspecified external cause status: Secondary | ICD-10-CM | POA: Insufficient documentation

## 2019-07-17 DIAGNOSIS — Y939 Activity, unspecified: Secondary | ICD-10-CM | POA: Insufficient documentation

## 2019-07-17 DIAGNOSIS — Y908 Blood alcohol level of 240 mg/100 ml or more: Secondary | ICD-10-CM | POA: Insufficient documentation

## 2019-07-17 DIAGNOSIS — F17210 Nicotine dependence, cigarettes, uncomplicated: Secondary | ICD-10-CM | POA: Insufficient documentation

## 2019-07-17 DIAGNOSIS — F10129 Alcohol abuse with intoxication, unspecified: Secondary | ICD-10-CM | POA: Insufficient documentation

## 2019-07-17 DIAGNOSIS — R4182 Altered mental status, unspecified: Secondary | ICD-10-CM | POA: Insufficient documentation

## 2019-07-17 LAB — CBC
HCT: 42 % (ref 36.0–46.0)
Hemoglobin: 13.8 g/dL (ref 12.0–15.0)
MCH: 33.4 pg (ref 26.0–34.0)
MCHC: 32.9 g/dL (ref 30.0–36.0)
MCV: 101.7 fL — ABNORMAL HIGH (ref 80.0–100.0)
Platelets: 342 10*3/uL (ref 150–400)
RBC: 4.13 MIL/uL (ref 3.87–5.11)
RDW: 12.6 % (ref 11.5–15.5)
WBC: 5 10*3/uL (ref 4.0–10.5)
nRBC: 0 % (ref 0.0–0.2)

## 2019-07-17 LAB — I-STAT BETA HCG BLOOD, ED (MC, WL, AP ONLY): I-stat hCG, quantitative: <5 m[IU]/mL (ref <5)

## 2019-07-17 NOTE — ED Triage Notes (Signed)
Per EMS- Patient had an unwitnessed fall. Bystanders think they heard the patient fall down some stairs. Patient was aggressive and combative with EMS and would not let them treat her.

## 2019-07-17 NOTE — ED Notes (Signed)
Patient intoxicated and is hard to arouse. Unable to finish triage.

## 2019-07-17 NOTE — ED Notes (Signed)
Pt heard yelling from the room. Pt yelling "I want my wife". Pt given the phone to call her wife, and educated on how to use the phone in the room.

## 2019-07-17 NOTE — ED Notes (Signed)
Pt moved to a room to get out of the hallway after voiding on the floor in triage. Pt began cussing at nursing staff (Sam, NT and [place], [place]) when asked to get vital signs. Pt refused vital signs. Pt asked to stop cussing, but pt still cussing/yelling after nursing staff left bedside.

## 2019-07-17 NOTE — ED Notes (Signed)
Pt transported back from CT.  

## 2019-07-17 NOTE — ED Provider Notes (Signed)
[facility] DEPT Provider Note   CSN: [id number] Arrival date & time: 07/17/19  1729     History Chief Complaint  Patient presents with  . Fall  . Alcohol Intoxication  . Aggressive Behavior  . incontinent    Sheila Graves is a 39 y.o. female presenting for evaluation of presumed fall and abnormal behavior.  Level 5 caveat due to altered mental status.  Per triage note, patient had an unwitnessed presumed fall.  Bystanders thought they heard the patient fall down some stairs.  Patient was aggressive and combative with EMS.   Additional history obtained from chart review.  History of alcohol and polysubstance abuse.  On my exam, patient is incoherent and unable to answer questions.  Repeatedly stating "I didn't do anything wrong" and "stop stop stop."   HPI     Past Medical History:  Diagnosis Date  . Alcohol withdrawal seizure ([place])   . ETOH abuse   . Hypokalemia   . Pancreatitis   . Thyroid disease     Patient Active Problem List   Diagnosis Date Noted  . 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 Marital conflict 98/92/1194  . Alcohol withdrawal syndrome with complication, with unspecified complication ([place]) 17/40/8144  . Acute metabolic encephalopathy 81/85/6314  . Rhabdomyolysis 03/21/2019  . Alcohol use disorder, severe, dependence ([place])   . Prolonged QT interval 03/16/2019  . Metabolic acidosis 97/07/6376  . AKI (acute kidney injury) ([place])   . Transaminitis   . Dehydration 03/15/2019  . Alcohol intoxication with moderate or severe use disorder ([place]) 02/04/2019  . Hypokalemia 02/04/2019  . Hypothermia 02/04/2019  . Hypotension 02/04/2019  . Fracture of clavicle, right, open 11/11/2018  . Major depressive disorder, recurrent, severe without psychotic features ([place]) 01/31/2018    Past Surgical History:  Procedure Laterality Date  . FINGER SURGERY    . IRRIGATION AND DEBRIDEMENT SHOULDER Right 11/12/2018   Procedure: Irrigation And Debridement Shoulder;  Surgeon: Shona Needles, MD;  Location: [facility];  Service: Orthopedics;  Laterality: Right;  . ORIF CLAVICULAR FRACTURE Right 11/12/2018   Procedure: OPEN REDUCTION INTERNAL FIXATION (ORIF) CLAVICULAR FRACTURE;  Surgeon: Shona Needles, MD;  Location: [facility];  Service: Orthopedics;  Laterality: Right;     OB History   No obstetric history on file.     Family History  Problem Relation Age of Onset  . Alcohol abuse Other     Social History   Tobacco Use  . Smoking status: Current Every Day Smoker    Packs/day: 1.00  . Smokeless tobacco: Never Used  Substance Use Topics  . Alcohol use: Yes    Comment: Last drink: 7pm   . Drug use: Not Currently    Types: Heroin    Home Medications Prior to Admission medications   Medication Sig Start Date End Date Taking? Authorizing Provider  escitalopram (LEXAPRO) 10 MG tablet Take 1 tablet (10 mg total) by mouth daily. 03/22/19   Dhungel, Flonnie Overman, MD  meloxicam (MOBIC) 7.5 MG tablet Take 1 tablet (7.5 mg total) by mouth daily. 03/23/19   Ok Edwards, PA-C    Allergies    Patient has no known allergies.  Review of Systems   Review of Systems  Unable to perform ROS: Mental status change    Physical Exam Updated Vital Signs BP 105/75   Pulse 86   Temp 97.7 F (36.5 C) (Oral)   Resp 16   SpO2 99%   Physical Exam Vitals and nursing note  reviewed.  Constitutional:      General: She is not in acute distress.    Appearance: She is well-developed.  HENT:     Head: Normocephalic and atraumatic.  Eyes:     Conjunctiva/sclera: Conjunctivae normal.     Pupils: Pupils are equal, round, and reactive to light.     Comments: PERRLA  Neck:     Comments: ttp of midline c spine Cardiovascular:     Rate and Rhythm: Normal rate and regular rhythm.     Pulses: Normal pulses.  Pulmonary:     Effort: Pulmonary effort is normal. No respiratory distress.     Breath sounds: Normal breath sounds. No  wheezing.  Abdominal:     General: There is no distension.     Palpations: Abdomen is soft. There is no mass.     Tenderness: There is no abdominal tenderness. There is no guarding or rebound.  Musculoskeletal:        General: Normal range of motion.     Cervical back: Tenderness present.     Comments: Moves all extremities. No obvious deformity  Skin:    General: Skin is warm and dry.     Capillary Refill: Capillary refill takes less than 2 seconds.  Neurological:     GCS: GCS eye subscore is 3. GCS verbal subscore is 3. GCS motor subscore is 5.     Comments: Initial GCS of 11     ED Results / Procedures / Treatments   Labs (all labs ordered are listed, but only abnormal results are displayed) Labs Reviewed  ETHANOL  CBC  COMPREHENSIVE METABOLIC PANEL  I-STAT BETA HCG BLOOD, ED ([facility], [facility], [facility] ONLY)    EKG None  Radiology No results found.  Procedures Procedures (including critical care time)  Medications Ordered in ED Medications - No data to display  ED Course  I have reviewed the triage vital signs and the nursing notes.  Pertinent labs & imaging results that were available during my care of the patient were reviewed by me and considered in my medical decision making (see chart for details).    MDM Rules/Calculators/A&P                      Patient presenting for evaluation after potential fall and altered mental status.  Initially, patient with a GCS of 11.  Speech is incomprehensible and she remains curled up in a ball.  Appears to be tender to palpation of midline C-spine.  Due to her history, likely alcohol intoxication.  However will obtain labs due to altered mental status.  Will obtain CT head and neck as I cannot clear this due to altered mental status.  Labs and imaging delayed due to lack of cooperation.   Patient ambulated without difficulty.  On reassessment, patient is more alert.  GCS of 15.  She is agreeable to getting labs and imaging at this  time.  CT head and neck negative for acute findings.  No fracture or bleed.  Labs pending.  On reassessment, patient remains alert and oriented.  She is upset that she cannot remember what happened earlier today.  Discussed pending lab work and imminent discharge afterwards.  Pt signed out to Leonia Reader, PA-C for f/u on labs.   Final Clinical Impression(s) / ED Diagnoses Final diagnoses:  None    Rx / DC Orders ED Discharge Orders    None       Leigha Olberding, PA-C 07/18/19 0001  Bethann Berkshire, MD 07/19/19 [phone]

## 2019-07-17 NOTE — ED Notes (Signed)
Writer present when pt denied wanting her blood drawn from [place], [place].

## 2019-07-17 NOTE — ED Notes (Signed)
Pt refused attempts to collect lab x1. Will attempt at later time. RN advised. [employer]

## 2019-07-17 NOTE — ED Provider Notes (Signed)
Presented intoxicated, not verbal Brought to ED because bystanders thought she fell and needed to be evaluated Labs pending - done for evaluation of altered mental status, presumed alcohol intoxication. Head CT, c-spine CT negative.   Now GCS 15, verbal, no complaint of pain, no vomiting, does not remember events prior to coming to the hospital.   Plan: review labs when completed and discharge.   Labs reviewed. Patient ambulatory, has been able to independently call for ride home. Stable for discharge.    Elpidio Anis, PA-C 07/18/19 [id number]    Palumbo, April, MD 07/18/19 [phone]

## 2019-07-18 LAB — COMPREHENSIVE METABOLIC PANEL
ALT: 19 U/L (ref 0–44)
AST: 27 U/L (ref 15–41)
Albumin: 4.3 g/dL (ref 3.5–5.0)
Alkaline Phosphatase: 58 U/L (ref 38–126)
Anion gap: 10 (ref 5–15)
BUN: 5 mg/dL — ABNORMAL LOW (ref 6–20)
CO2: 29 mmol/L (ref 22–32)
Calcium: 8.8 mg/dL — ABNORMAL LOW (ref 8.9–10.3)
Chloride: 107 mmol/L (ref 98–111)
Creatinine, Ser: 0.56 mg/dL (ref 0.44–1.00)
GFR calc Af Amer: >60 mL/min (ref >60)
GFR calc non Af Amer: >60 mL/min (ref >60)
Glucose, Bld: 96 mg/dL (ref 70–99)
Potassium: 3.6 mmol/L (ref 3.5–5.1)
Sodium: 146 mmol/L — ABNORMAL HIGH (ref 135–145)
Total Bilirubin: 0.6 mg/dL (ref 0.3–1.2)
Total Protein: 6.9 g/dL (ref 6.5–8.1)

## 2019-07-18 LAB — RAPID URINE DRUG SCREEN, HOSP PERFORMED
Amphetamines: NOT DETECTED
Barbiturates: NOT DETECTED
Benzodiazepines: NOT DETECTED
Cocaine: NOT DETECTED
Opiates: NOT DETECTED
Tetrahydrocannabinol: NOT DETECTED

## 2019-07-18 LAB — SALICYLATE LEVEL: Salicylate Lvl: <7 mg/dL — ABNORMAL LOW (ref 7.0–30.0)

## 2019-07-18 LAB — ETHANOL: Alcohol, Ethyl (B): 337 mg/dL (ref <10)

## 2019-07-18 LAB — ACETAMINOPHEN LEVEL: Acetaminophen (Tylenol), Serum: <10 ug/mL — ABNORMAL LOW (ref 10–30)

## 2019-07-18 NOTE — ED Notes (Signed)
Pt attempting to provide urine specimen

## 2019-07-18 NOTE — ED Notes (Signed)
Pt encouraged to provide urine specimen. Pt stated to writer, "I'm not on drugs". Pt refusing to provide urine specimen at this time.

## 2019-07-18 NOTE — Discharge Instructions (Signed)
Your work-up today was reassuring.  Your CT of your head and neck were normal. Your blood work was normal.  You are medically cleared to go to a rehab facility. Return to the emergency room with any new, worsening, concerning symptoms.

## 2019-07-20 ENCOUNTER — Other Ambulatory Visit: Payer: Self-pay

## 2019-07-20 ENCOUNTER — Emergency Department (HOSPITAL_COMMUNITY)
Admission: EM | Admit: 2019-07-20 | Discharge: 2019-07-20 | Disposition: A | Discharge status: Home / Self Care | Payer: Self-pay | Attending: Emergency Medicine | Admitting: Emergency Medicine

## 2019-07-20 ENCOUNTER — Encounter (HOSPITAL_COMMUNITY): Payer: Self-pay | Admitting: Emergency Medicine

## 2019-07-20 DIAGNOSIS — F10921 Alcohol use, unspecified with intoxication delirium: Secondary | ICD-10-CM | POA: Insufficient documentation

## 2019-07-20 DIAGNOSIS — E162 Hypoglycemia, unspecified: Secondary | ICD-10-CM | POA: Insufficient documentation

## 2019-07-20 LAB — BASIC METABOLIC PANEL
Anion gap: 30 — ABNORMAL HIGH (ref 5–15)
BUN: 24 mg/dL — ABNORMAL HIGH (ref 6–20)
CO2: 12 mmol/L — ABNORMAL LOW (ref 22–32)
Calcium: 8.4 mg/dL — ABNORMAL LOW (ref 8.9–10.3)
Chloride: 100 mmol/L (ref 98–111)
Creatinine, Ser: 1.11 mg/dL — ABNORMAL HIGH (ref 0.44–1.00)
GFR calc Af Amer: >60 mL/min (ref >60)
GFR calc non Af Amer: >60 mL/min (ref >60)
Glucose, Bld: 73 mg/dL (ref 70–99)
Potassium: 4.4 mmol/L (ref 3.5–5.1)
Sodium: 142 mmol/L (ref 135–145)

## 2019-07-20 LAB — CBC WITH DIFFERENTIAL/PLATELET
Abs Immature Granulocytes: 0.07 10*3/uL (ref 0.00–0.07)
Basophils Absolute: 0.1 10*3/uL (ref 0.0–0.1)
Basophils Relative: 1 %
Eosinophils Absolute: 0 10*3/uL (ref 0.0–0.5)
Eosinophils Relative: 0 %
HCT: 46.5 % — ABNORMAL HIGH (ref 36.0–46.0)
Hemoglobin: 15.5 g/dL — ABNORMAL HIGH (ref 12.0–15.0)
Immature Granulocytes: 1 %
Lymphocytes Relative: 9 %
Lymphs Abs: 1 10*3/uL (ref 0.7–4.0)
MCH: 34 pg (ref 26.0–34.0)
MCHC: 33.3 g/dL (ref 30.0–36.0)
MCV: 102 fL — ABNORMAL HIGH (ref 80.0–100.0)
Monocytes Absolute: 0.2 10*3/uL (ref 0.1–1.0)
Monocytes Relative: 2 %
Neutro Abs: 9.5 10*3/uL — ABNORMAL HIGH (ref 1.7–7.7)
Neutrophils Relative %: 87 %
Platelets: 357 10*3/uL (ref 150–400)
RBC: 4.56 MIL/uL (ref 3.87–5.11)
RDW: 12.7 % (ref 11.5–15.5)
WBC: 10.7 10*3/uL — ABNORMAL HIGH (ref 4.0–10.5)
nRBC: 0 % (ref 0.0–0.2)

## 2019-07-20 LAB — RAPID URINE DRUG SCREEN, HOSP PERFORMED
Amphetamines: NOT DETECTED
Barbiturates: NOT DETECTED
Benzodiazepines: NOT DETECTED
Cocaine: NOT DETECTED
Opiates: NOT DETECTED
Tetrahydrocannabinol: NOT DETECTED

## 2019-07-20 LAB — I-STAT BETA HCG BLOOD, ED (MC, WL, AP ONLY): I-stat hCG, quantitative: <5 m[IU]/mL (ref <5)

## 2019-07-20 LAB — ETHANOL: Alcohol, Ethyl (B): 429 mg/dL (ref <10)

## 2019-07-20 LAB — ACETAMINOPHEN LEVEL: Acetaminophen (Tylenol), Serum: <10 ug/mL — ABNORMAL LOW (ref 10–30)

## 2019-07-20 LAB — SALICYLATE LEVEL: Salicylate Lvl: <7 mg/dL — ABNORMAL LOW (ref 7.0–30.0)

## 2019-07-20 LAB — CBG MONITORING, ED: Glucose-Capillary: 67 mg/dL — ABNORMAL LOW (ref 70–99)

## 2019-07-20 MED ORDER — THIAMINE HCL 100 MG/ML IJ SOLN
Freq: Once | INTRAVENOUS | Status: AC
  Filled 2019-07-20: qty 1000

## 2019-07-20 NOTE — Discharge Instructions (Addendum)
You were seen in the emergency department for alcohol intoxication.  Your blood sugar was low and that was supplemented.  Please only use alcohol in moderation.  Follow-up with your doctor and return to the emergency department for any worsening symptoms.

## 2019-07-20 NOTE — ED Provider Notes (Signed)
[facility]-EMERGENCY DEPT Provider Note: Lowella Dell, MD, FACEP  CSN: [id number] MRN: [id number] ARRIVAL: 07/20/19 at 0437 ROOM: WA14/WA14   CHIEF COMPLAINT  Alcohol Intoxication and Hypoglycemia  Level 5 caveat: Altered mental status; intoxication HISTORY OF PRESENT ILLNESS  07/20/19 4:50 AM Sheila Graves is a 39 y.o. female with history of alcoholism but not diabetes.  EMS was called after a welfare check revealed the patient to be intoxicated with a decreased level of consciousness.  She was found to have a blood sugar of 50.  This came up to 57 after oral glucose and she was then given IM glucagon (an IV attempt was unsuccessful).  On arrival her blood sugar is 67.  She is somnolent but able to admit to alcohol; she denies drugs or suicidal ideation.  EMS reports no evidence of a fall on scene or trauma to the patient.   Past Medical History:  Diagnosis Date  . Alcohol withdrawal seizure ([facility])   . ETOH abuse   . Hypokalemia   . Pancreatitis   . Thyroid disease     Past Surgical History:  Procedure Laterality Date  . FINGER SURGERY    . IRRIGATION AND DEBRIDEMENT SHOULDER Right 11/12/2018   Procedure: Irrigation And Debridement Shoulder;  Surgeon: Roby Lofts, MD;  [facility];  Service: Orthopedics;  Laterality: Right;  . ORIF CLAVICULAR FRACTURE Right 11/12/2018   Procedure: OPEN REDUCTION INTERNAL FIXATION (ORIF) CLAVICULAR FRACTURE;  Surgeon: Roby Lofts, MD;  [facility];  Service: Orthopedics;  Laterality: Right;    Family History  Problem Relation Age of Onset  . Alcohol abuse Other     Social History   Tobacco Use  . Smoking status: Current Every Day Smoker    Packs/day: 1.00  . Smokeless tobacco: Never Used  Substance Use Topics  . Alcohol use: Yes    Comment: Last drink: 7pm   . Drug use: Not Currently    Types: Heroin    Prior to Admission medications   Medication Sig Start Date End Date Taking? Authorizing Provider  escitalopram (LEXAPRO)  10 MG tablet Take 1 tablet (10 mg total) by mouth daily. 03/22/19   Dhungel, Theda Belfast, MD  meloxicam (MOBIC) 7.5 MG tablet Take 1 tablet (7.5 mg total) by mouth daily. 03/23/19   Belinda Fisher, PA-C    Allergies Patient has no known allergies.   REVIEW OF SYSTEMS     PHYSICAL EXAMINATION  Initial Vital Signs Blood pressure 113/74, pulse (!) 106, temperature 97.6 F (36.4 C), temperature source Oral, resp. rate 19, SpO2 100 %.  Examination General: Well-developed, well-nourished female in no acute distress; appearance consistent with age of record HENT: normocephalic; atraumatic Eyes: pupils equal, round and reactive to light Neck: supple Heart: regular rate and rhythm Lungs: clear to auscultation bilaterally Abdomen: soft; nondistended; nontender; bowel sounds present Extremities: No deformity; full range of motion; pulses normal Neurologic: Somnolent but arousable; noted to move all extremities Skin: Warm and dry Psychiatric: Denies SI   RESULTS  Summary of this visit's results, reviewed and interpreted by myself:   EKG Interpretation  Date/Time:    Ventricular Rate:    PR Interval:    QRS Duration:   QT Interval:    QTC Calculation:   R Axis:     Text Interpretation:        Laboratory Studies: Results for orders placed or performed during the hospital encounter of 07/20/19 (from the past 24 hour(s))  CBG monitoring, ED  Status: Abnormal   Collection Time: 07/20/19  4:45 AM  Result Value Ref Range   Glucose-Capillary 67 (L) 70 - 99 mg/dL  CBC with Differential/Platelet     Status: Abnormal   Collection Time: 07/20/19  5:03 AM  Result Value Ref Range   WBC 10.7 (H) 4.0 - 10.5 K/uL   RBC 4.56 3.87 - 5.11 MIL/uL   Hemoglobin 15.5 (H) 12.0 - 15.0 g/dL   HCT 16.1 (H) 09.6 - 04.5 %   MCV 102.0 (H) 80.0 - 100.0 fL   MCH 34.0 26.0 - 34.0 pg   MCHC 33.3 30.0 - 36.0 g/dL   RDW 40.9 81.1 - 91.4 %   Platelets 357 150 - 400 K/uL   nRBC 0.0 0.0 - 0.2 %   Neutrophils  Relative % 87 %   Neutro Abs 9.5 (H) 1.7 - 7.7 K/uL   Lymphocytes Relative 9 %   Lymphs Abs 1.0 0.7 - 4.0 K/uL   Monocytes Relative 2 %   Monocytes Absolute 0.2 0.1 - 1.0 K/uL   Eosinophils Relative 0 %   Eosinophils Absolute 0.0 0.0 - 0.5 K/uL   Basophils Relative 1 %   Basophils Absolute 0.1 0.0 - 0.1 K/uL   Immature Granulocytes 1 %   Abs Immature Granulocytes 0.07 0.00 - 0.07 K/uL  Basic metabolic panel     Status: Abnormal   Collection Time: 07/20/19  5:03 AM  Result Value Ref Range   Sodium 142 135 - 145 mmol/L   Potassium 4.4 3.5 - 5.1 mmol/L   Chloride 100 98 - 111 mmol/L   CO2 12 (L) 22 - 32 mmol/L   Glucose, Bld 73 70 - 99 mg/dL   BUN 24 (H) 6 - 20 mg/dL   Creatinine, Ser 7.82 (H) 0.44 - 1.00 mg/dL   Calcium 8.4 (L) 8.9 - 10.3 mg/dL   GFR calc non Af Amer >60 >60 mL/min   GFR calc Af Amer >60 >60 mL/min   Anion gap 30 (H) 5 - 15  Ethanol     Status: Abnormal   Collection Time: 07/20/19  5:03 AM  Result Value Ref Range   Alcohol, Ethyl (B) 429 (HH) <10 mg/dL  Acetaminophen level     Status: Abnormal   Collection Time: 07/20/19  5:03 AM  Result Value Ref Range   Acetaminophen (Tylenol), Serum <10 (L) 10 - 30 ug/mL  Salicylate level     Status: Abnormal   Collection Time: 07/20/19  5:03 AM  Result Value Ref Range   Salicylate Lvl <7.0 (L) 7.0 - 30.0 mg/dL  Rapid urine drug screen (hospital performed)     Status: None   Collection Time: 07/20/19  5:06 AM  Result Value Ref Range   Opiates NONE DETECTED NONE DETECTED   Cocaine NONE DETECTED NONE DETECTED   Benzodiazepines NONE DETECTED NONE DETECTED   Amphetamines NONE DETECTED NONE DETECTED   Tetrahydrocannabinol NONE DETECTED NONE DETECTED   Barbiturates NONE DETECTED NONE DETECTED  I-Stat Beta hCG blood, ED ([facility], [facility], [facility] only)     Status: None   Collection Time: 07/20/19  5:29 AM  Result Value Ref Range   I-stat hCG, quantitative <5.0 <5 mIU/mL   Comment 3           Imaging Studies: No results found.  ED  COURSE and MDM  Nursing notes, initial and subsequent vitals signs, including pulse oximetry, reviewed and interpreted by myself.  Vitals:   07/20/19 0454 07/20/19 0530 07/20/19 0545 07/20/19 0600  BP: 113/74 102/79 (!) 90/58 (!) 89/59  Pulse: (!) 106 (!) 108 (!) 103 98  Resp: 19 15 14 18   Temp: 97.6 F (36.4 C)     TempSrc: Oral     SpO2: 100% 100% 100% 100%   Medications  dextrose 5 % and 0.9% NaCl 5-0.9 % 1,000 mL with thiamine 157 mg, folic acid 1 mg, multivitamins adult 10 mL infusion ( Intravenous New Bag/Given 07/20/19 0524)   Patient given D5 normal saline infusion with vitamins.  Will observe until sober.  7:00 AM Patient signed out to Dr. Melina Copa.  PROCEDURES  Procedures   ED DIAGNOSES     ICD-10-CM   1. Alcohol intoxication with delirium ([place])  F10.921   2. Hypoglycemia without diagnosis of diabetes mellitus  E16.2        Oneta Sigman, MD 07/20/19 2238

## 2019-07-20 NOTE — ED Notes (Signed)
Patient does not want her wife updated on status, given a charger so her phone can charge and she can call a lyft to get home. Alert and oriented at this time.

## 2019-07-20 NOTE — ED Provider Notes (Signed)
Signout from Dr. Read Drivers.  39 year old female here with alcohol intoxication.  Plan is to observe until more sober.  Reassess. Physical Exam  BP (!) 92/52   Pulse 98   Temp 97.6 F (36.4 C) (Oral)   Resp 13   SpO2 100%   Physical Exam  ED Course/Procedures     Procedures  MDM  She was observed for over 8 hours.  She is now awake and alert and has eaten.  No medical complaints.       Terrilee Files, MD 07/20/19 [phone]

## 2019-07-20 NOTE — ED Notes (Signed)
Given a lunch tray and encouraged to eat/drink.

## 2019-07-20 NOTE — ED Triage Notes (Signed)
Patient intoxicated In and out of detox  BS 53 Oral glucose x 2

## 2019-07-20 NOTE — ED Notes (Signed)
Sheila Graves, wife, [phone] would like a call with an update on patient.

## 2019-07-21 ENCOUNTER — Other Ambulatory Visit: Payer: Self-pay

## 2019-07-21 ENCOUNTER — Emergency Department (HOSPITAL_COMMUNITY)
Admission: EM | Admit: 2019-07-21 | Discharge: 2019-07-21 | Disposition: A | Discharge status: Home / Self Care | Payer: Self-pay | Attending: Emergency Medicine | Admitting: Emergency Medicine

## 2019-07-21 ENCOUNTER — Emergency Department (HOSPITAL_COMMUNITY): Payer: Self-pay

## 2019-07-21 DIAGNOSIS — Y908 Blood alcohol level of 240 mg/100 ml or more: Secondary | ICD-10-CM | POA: Insufficient documentation

## 2019-07-21 DIAGNOSIS — F17210 Nicotine dependence, cigarettes, uncomplicated: Secondary | ICD-10-CM | POA: Insufficient documentation

## 2019-07-21 DIAGNOSIS — Z79899 Other long term (current) drug therapy: Secondary | ICD-10-CM | POA: Insufficient documentation

## 2019-07-21 DIAGNOSIS — R4182 Altered mental status, unspecified: Secondary | ICD-10-CM | POA: Insufficient documentation

## 2019-07-21 DIAGNOSIS — F10920 Alcohol use, unspecified with intoxication, uncomplicated: Secondary | ICD-10-CM | POA: Insufficient documentation

## 2019-07-21 LAB — HCG, QUANTITATIVE, PREGNANCY: hCG, Beta Chain, Quant, S: <1 m[IU]/mL (ref <5)

## 2019-07-21 LAB — CBC WITH DIFFERENTIAL/PLATELET
Abs Immature Granulocytes: 0.02 10*3/uL (ref 0.00–0.07)
Basophils Absolute: 0 10*3/uL (ref 0.0–0.1)
Basophils Relative: 1 %
Eosinophils Absolute: 0.2 10*3/uL (ref 0.0–0.5)
Eosinophils Relative: 3 %
HCT: 40 % (ref 36.0–46.0)
Hemoglobin: 13.5 g/dL (ref 12.0–15.0)
Immature Granulocytes: 0 %
Lymphocytes Relative: 28 %
Lymphs Abs: 1.9 10*3/uL (ref 0.7–4.0)
MCH: 33.3 pg (ref 26.0–34.0)
MCHC: 33.8 g/dL (ref 30.0–36.0)
MCV: 98.5 fL (ref 80.0–100.0)
Monocytes Absolute: 0.3 10*3/uL (ref 0.1–1.0)
Monocytes Relative: 4 %
Neutro Abs: 4.5 10*3/uL (ref 1.7–7.7)
Neutrophils Relative %: 64 %
Platelets: 297 10*3/uL (ref 150–400)
RBC: 4.06 MIL/uL (ref 3.87–5.11)
RDW: 13 % (ref 11.5–15.5)
WBC: 7 10*3/uL (ref 4.0–10.5)
nRBC: 0 % (ref 0.0–0.2)

## 2019-07-21 LAB — CBG MONITORING, ED: Glucose-Capillary: 80 mg/dL (ref 70–99)

## 2019-07-21 LAB — COMPREHENSIVE METABOLIC PANEL
ALT: 27 U/L (ref 0–44)
AST: 57 U/L — ABNORMAL HIGH (ref 15–41)
Albumin: 4.5 g/dL (ref 3.5–5.0)
Alkaline Phosphatase: 73 U/L (ref 38–126)
Anion gap: 20 — ABNORMAL HIGH (ref 5–15)
BUN: 13 mg/dL (ref 6–20)
CO2: 22 mmol/L (ref 22–32)
Calcium: 8.7 mg/dL — ABNORMAL LOW (ref 8.9–10.3)
Chloride: 97 mmol/L — ABNORMAL LOW (ref 98–111)
Creatinine, Ser: 0.83 mg/dL (ref 0.44–1.00)
GFR calc Af Amer: >60 mL/min (ref >60)
GFR calc non Af Amer: >60 mL/min (ref >60)
Glucose, Bld: 85 mg/dL (ref 70–99)
Potassium: 3.7 mmol/L (ref 3.5–5.1)
Sodium: 139 mmol/L (ref 135–145)
Total Bilirubin: 0.8 mg/dL (ref 0.3–1.2)
Total Protein: 7.4 g/dL (ref 6.5–8.1)

## 2019-07-21 LAB — SALICYLATE LEVEL: Salicylate Lvl: <7 mg/dL — ABNORMAL LOW (ref 7.0–30.0)

## 2019-07-21 LAB — ACETAMINOPHEN LEVEL: Acetaminophen (Tylenol), Serum: <10 ug/mL — ABNORMAL LOW (ref 10–30)

## 2019-07-21 LAB — ETHANOL: Alcohol, Ethyl (B): 396 mg/dL (ref <10)

## 2019-07-21 NOTE — ED Provider Notes (Signed)
[facility] DEPT Provider Note   CSN: [id number] Arrival date & time: 07/21/19  1318     History No chief complaint on file.   Sheila Graves is a 39 y.o. female.  39 yo F with a chief complaints of alcohol intoxication.  This was documented by EMS.  She apparently was found at a hotel where she was being evicted.  She was thought to be drunk and was very sleepy.  Was taken to the ED for evaluation.  Upon my exam the patient will not awake for conversation.  Level 5 caveat.  The history is provided by the patient.  Illness Severity:  Mild Onset quality:  Sudden Timing:  Constant Progression:  Worsening Chronicity:  New Associated symptoms: no chest pain, no congestion, no fever, no headaches, no myalgias, no nausea, no rhinorrhea, no shortness of breath, no vomiting and no wheezing        Past Medical History:  Diagnosis Date  . Alcohol withdrawal seizure ([place])   . ETOH abuse   . Hypokalemia   . Pancreatitis   . Thyroid disease     Patient Active Problem List   Diagnosis Date Noted  . 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 Marital conflict 14/48/1856  . Alcohol withdrawal syndrome with complication, with unspecified complication ([place]) 31/49/7026  . Acute metabolic encephalopathy 37/85/8850  . Rhabdomyolysis 03/21/2019  . Alcohol use disorder, severe, dependence ([place])   . Prolonged QT interval 03/16/2019  . Metabolic acidosis 27/74/1287  . AKI (acute kidney injury) ([place])   . Transaminitis   . Dehydration 03/15/2019  . Alcohol intoxication with moderate or severe use disorder ([place]) 02/04/2019  . Hypokalemia 02/04/2019  . Hypothermia 02/04/2019  . Hypotension 02/04/2019  . Fracture of clavicle, right, open 11/11/2018  . Major depressive disorder, recurrent, severe without psychotic features ([place]) 01/31/2018    Past Surgical History:  Procedure Laterality Date  . FINGER SURGERY    . IRRIGATION AND DEBRIDEMENT SHOULDER  Right 11/12/2018   Procedure: Irrigation And Debridement Shoulder;  Surgeon: Shona Needles, MD;  Location: [facility];  Service: Orthopedics;  Laterality: Right;  . ORIF CLAVICULAR FRACTURE Right 11/12/2018   Procedure: OPEN REDUCTION INTERNAL FIXATION (ORIF) CLAVICULAR FRACTURE;  Surgeon: Shona Needles, MD;  Location: [facility];  Service: Orthopedics;  Laterality: Right;     OB History   No obstetric history on file.     Family History  Problem Relation Age of Onset  . Alcohol abuse Other     Social History   Tobacco Use  . Smoking status: Current Every Day Smoker    Packs/day: 1.00  . Smokeless tobacco: Never Used  Substance Use Topics  . Alcohol use: Yes    Comment: Last drink: 7pm   . Drug use: Not Currently    Types: Heroin    Home Medications Prior to Admission medications   Medication Sig Start Date End Date Taking? Authorizing Provider  escitalopram (LEXAPRO) 10 MG tablet Take 1 tablet (10 mg total) by mouth daily. 03/22/19   Dhungel, Flonnie Overman, MD  meloxicam (MOBIC) 7.5 MG tablet Take 1 tablet (7.5 mg total) by mouth daily. 03/23/19   Ok Edwards, PA-C    Allergies    Patient has no known allergies.  Review of Systems   Review of Systems  Unable to perform ROS: Patient nonverbal  Constitutional: Negative for chills and fever.  HENT: Negative for congestion and rhinorrhea.   Eyes: Negative for redness and visual disturbance.  Respiratory: Negative for shortness of breath and wheezing.   Cardiovascular: Negative for chest pain and palpitations.  Gastrointestinal: Negative for nausea and vomiting.  Genitourinary: Negative for dysuria and urgency.  Musculoskeletal: Negative for arthralgias and myalgias.  Skin: Negative for pallor and wound.  Neurological: Negative for dizziness and headaches.    Physical Exam Updated Vital Signs BP 128/64 (BP Location: Left Arm)   Pulse (!) 111   Temp 98.3 F (36.8 C) (Oral)   Resp 18   Ht 5\' 5"  (1.651 m)   Wt 65.8 kg   SpO2  100%   BMI 24.13 kg/m   Physical Exam Vitals and nursing note reviewed.  Constitutional:      General: She is not in acute distress.    Appearance: She is well-developed. She is not diaphoretic.  HENT:     Head: Normocephalic and atraumatic.  Eyes:     Pupils: Pupils are equal, round, and reactive to light.  Cardiovascular:     Rate and Rhythm: Normal rate and regular rhythm.     Heart sounds: No murmur. No friction rub. No gallop.   Pulmonary:     Effort: Pulmonary effort is normal.     Breath sounds: No wheezing or rales.  Abdominal:     General: There is no distension.     Palpations: Abdomen is soft.     Tenderness: There is no abdominal tenderness. There is no guarding.  Musculoskeletal:        General: No tenderness.     Cervical back: Normal range of motion and neck supple.  Skin:    General: Skin is warm and dry.  Psychiatric:        Behavior: Behavior normal.     ED Results / Procedures / Treatments   Labs (all labs ordered are listed, but only abnormal results are displayed) Labs Reviewed  COMPREHENSIVE METABOLIC PANEL - Abnormal; Notable for the following components:      Result Value   Chloride 97 (*)    Calcium 8.7 (*)    AST 57 (*)    Anion gap 20 (*)    All other components within normal limits  ETHANOL - Abnormal; Notable for the following components:   Alcohol, Ethyl (B) 396 (*)    All other components within normal limits  ACETAMINOPHEN LEVEL - Abnormal; Notable for the following components:   Acetaminophen (Tylenol), Serum <10 (*)    All other components within normal limits  SALICYLATE LEVEL - Abnormal; Notable for the following components:   Salicylate Lvl <7.0 (*)    All other components within normal limits  CBC WITH DIFFERENTIAL/PLATELET  HCG, QUANTITATIVE, PREGNANCY  URINALYSIS, ROUTINE W REFLEX MICROSCOPIC  CBG MONITORING, ED    EKG None  Radiology No results found.  Procedures Procedures (including critical care  time)  Medications Ordered in ED Medications - No data to display  ED Course  I have reviewed the triage vital signs and the nursing notes.  Pertinent labs & imaging results that were available during my care of the patient were reviewed by me and considered in my medical decision making (see chart for details).    MDM Rules/Calculators/A&P                      39 yo F with a chief complaints of likely alcohol intoxication.  Unfortunately the patient is not arousing on my exam.  This could be intentional but as she cannot provide history will obtain imaging  of the head and lab work.  Reevaluate.  Signed out to Dr. Rodena Medin.  Please see their note for further details of care.   The patients results and plan were reviewed and discussed.   Any x-rays performed were independently reviewed by myself.   Differential diagnosis were considered with the presenting HPI.  Medications - No data to display  Vitals:   07/21/19 1327 07/21/19 1336  BP:  128/64  Pulse:  (!) 111  Resp:  18  Temp:  98.3 F (36.8 C)  TempSrc:  Oral  SpO2:  100%  Weight: 65.8 kg   Height: 5\' 5"  (1.651 m)     Final diagnoses:  Alcoholic intoxication without complication [facility])      Final Clinical Impression(s) / ED Diagnoses Final diagnoses:  Alcoholic intoxication without complication [facility])    Rx / DC Orders ED Discharge Orders    None       [facility], DO 07/21/19 1641

## 2019-07-21 NOTE — ED Provider Notes (Signed)
Patient seen after prior ED provider.    She is now alert and oriented.  She is able to ambulate with a steady gait.  She desires discharge.  Patient does understand the need for close follow-up.  Strict return precautions given and understood.     Wynetta Fines, MD 07/21/19 1925

## 2019-07-21 NOTE — Discharge Instructions (Addendum)
Please return for any problem. Follow with a regular care provider as instructed.

## 2019-07-21 NOTE — ED Notes (Addendum)
Date and time results received: 07/21/19 1514 (use smartphrase ".now" to insert current time)  Test: ETOH Critical Value: 369  Name of Provider Notified: Adela Lank MD   Orders Received? Or Actions Taken?:

## 2019-07-21 NOTE — ED Notes (Signed)
Pt got a Lyft driver who is waiting outside. She ambulated out of the ED with a steady gait and in no distress.

## 2019-07-21 NOTE — ED Triage Notes (Signed)
Per EMS, patient comes from hotel, hotel mgmt called EMS stating patient was being evicted today and they found her drunk in the bed. Patient responsive to EMS and told EMS she needed help.

## 2019-07-28 ENCOUNTER — Other Ambulatory Visit: Payer: Self-pay

## 2019-07-28 ENCOUNTER — Emergency Department (HOSPITAL_COMMUNITY)
Admission: EM | Admit: 2019-07-28 | Discharge: 2019-07-28 | Disposition: A | Discharge status: Home / Self Care | Payer: Self-pay | Attending: Emergency Medicine | Admitting: Emergency Medicine

## 2019-07-28 ENCOUNTER — Encounter (HOSPITAL_COMMUNITY): Payer: Self-pay

## 2019-07-28 DIAGNOSIS — F1010 Alcohol abuse, uncomplicated: Secondary | ICD-10-CM

## 2019-07-28 DIAGNOSIS — F10129 Alcohol abuse with intoxication, unspecified: Secondary | ICD-10-CM | POA: Insufficient documentation

## 2019-07-28 DIAGNOSIS — Z20822 Contact with and (suspected) exposure to covid-19: Secondary | ICD-10-CM | POA: Insufficient documentation

## 2019-07-28 DIAGNOSIS — F17210 Nicotine dependence, cigarettes, uncomplicated: Secondary | ICD-10-CM | POA: Insufficient documentation

## 2019-07-28 DIAGNOSIS — F10920 Alcohol use, unspecified with intoxication, uncomplicated: Secondary | ICD-10-CM

## 2019-07-28 LAB — RESPIRATORY PANEL BY RT PCR (FLU A&B, COVID)
Influenza A by PCR: NEGATIVE
Influenza B by PCR: NEGATIVE
SARS Coronavirus 2 by RT PCR: NEGATIVE

## 2019-07-28 LAB — CBG MONITORING, ED: Glucose-Capillary: 79 mg/dL (ref 70–99)
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
## 2019-07-28 NOTE — ED Notes (Signed)
TTS at bedside. 

## 2019-07-28 NOTE — Discharge Instructions (Signed)
Recommend seeking out rehab and alcohol treatment options as was discussed with our psychiatry team.  If you have any thoughts of hurting herself, hurting others, suicidal thoughts or any actions to hurt yourself, please return to ER for reassessment.

## 2019-07-28 NOTE — ED Notes (Signed)
Attempted to draw labs but was unsuccessful. x2 

## 2019-07-28 NOTE — ED Provider Notes (Signed)
[facility]-EMERGENCY DEPT Provider Note   CSN: [id number] Arrival date & time: 07/28/19  1800     History Chief Complaint  Patient presents with  . Alcohol Intoxication    Sheila Graves is a 39 y.o. female.  Presents to the emergency room with chief complaint alcohol intoxication, suicidal thoughts.  Patient states she has had a long history of depression, states that she has had a long history of wanting to die, wants to drink herself to death.  Has not taken any measures to hurt herself except for excessive drinking.  Specifically, denies taking any extra medicines today.  HPI     Past Medical History:  Diagnosis Date  . Alcohol withdrawal seizure (HCC)   . ETOH abuse   . Hypokalemia   . Pancreatitis   . Thyroid disease     Patient Active Problem List   Diagnosis Date Noted  . 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 Marital conflict 04/05/2019  . Alcohol withdrawal syndrome with complication, with unspecified complication (HCC) 03/21/2019  . Acute metabolic encephalopathy 03/21/2019  . Rhabdomyolysis 03/21/2019  . Alcohol use disorder, severe, dependence (HCC)   . Prolonged QT interval 03/16/2019  . Metabolic acidosis 03/16/2019  . AKI (acute kidney injury) (HCC)   . Transaminitis   . Dehydration 03/15/2019  . Alcohol intoxication with moderate or severe use disorder (HCC) 02/04/2019  . Hypokalemia 02/04/2019  . Hypothermia 02/04/2019  . Hypotension 02/04/2019  . Fracture of clavicle, right, open 11/11/2018  . 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 FINGER SURGERY    . IRRIGATION AND DEBRIDEMENT SHOULDER Right 11/12/2018   Procedure: Irrigation And Debridement Shoulder;  Surgeon: Roby Lofts, MD;  Location: [facility] OR;  Service: Orthopedics;  Laterality: Right;  . ORIF CLAVICULAR FRACTURE Right 11/12/2018   Procedure: OPEN  REDUCTION INTERNAL FIXATION (ORIF) CLAVICULAR FRACTURE;  Surgeon: Roby Lofts, MD;  Location: [facility] OR;  Service: Orthopedics;  Laterality: Right;     OB History   No obstetric history on file.     Family History  Problem Relation Age of Onset  . Alcohol abuse Other     Social History   Tobacco Use  . Smoking status: Current Every Day Smoker    Packs/day: 1.00  . Smokeless tobacco: Never Used  Substance Use Topics  . Alcohol use: Yes    Comment: Last drink: 7pm   . Drug use: Not Currently    Types: Heroin    Home Medications Prior to Admission medications   Medication Sig Start Date End Date Taking? Authorizing Provider  escitalopram (LEXAPRO) 10 MG tablet Take 1 tablet (10 mg total) by mouth daily. 03/22/19   Dhungel, Theda Belfast, MD  meloxicam (MOBIC) 7.5 MG tablet Take 1 tablet (7.5 mg total) by mouth daily. 03/23/19   Belinda Fisher, PA-C    Allergies    Patient has no known allergies.  Review of Systems   Review of Systems  Constitutional: Negative for chills and fever.  HENT: Negative for ear pain and sore throat.   Eyes: Negative for pain and visual disturbance.  Respiratory: Negative for cough and shortness of breath.   Cardiovascular: Negative for chest pain and palpitations.  Gastrointestinal: Negative for abdominal pain and vomiting.  Genitourinary: Negative for dysuria and hematuria.  Musculoskeletal: Negative for arthralgias and back pain.  Skin: Negative for color change and rash.  Neurological: Negative for seizures  and syncope.  Psychiatric/Behavioral: Positive for agitation, behavioral problems and suicidal ideas.  All other systems reviewed and are negative.   Physical Exam Updated Vital Signs There were no vitals taken for this visit.  Physical Exam Vitals and nursing note reviewed.  Constitutional:      General: She is not in acute distress.    Appearance: She is well-developed.     Comments: Intoxicated, but awakens easily, answers questions  appropriately  HENT:     Head: Normocephalic and atraumatic.  Eyes:     Conjunctiva/sclera: Conjunctivae normal.  Cardiovascular:     Rate and Rhythm: Normal rate and regular rhythm.     Heart sounds: No murmur.  Pulmonary:     Effort: Pulmonary effort is normal. No respiratory distress.     Breath sounds: Normal breath sounds.  Abdominal:     Palpations: Abdomen is soft.     Tenderness: There is no abdominal tenderness.  Musculoskeletal:     Cervical back: Neck supple.  Skin:    General: Skin is warm and dry.  Neurological:     General: No focal deficit present.     Mental Status: She is alert and oriented to person, place, and time.  Psychiatric:     Comments: Tearful, exhibits suicidal ideation but no homicidal ideation     ED Results / Procedures / Treatments   Labs (all labs ordered are listed, but only abnormal results are displayed) Labs Reviewed  RESPIRATORY PANEL BY RT PCR (FLU A&B, COVID)  COMPREHENSIVE METABOLIC PANEL  ETHANOL  RAPID URINE DRUG SCREEN, HOSP PERFORMED  CBC WITH DIFFERENTIAL/PLATELET  SALICYLATE LEVEL  ACETAMINOPHEN LEVEL  URINALYSIS, ROUTINE W REFLEX MICROSCOPIC  CBG MONITORING, ED  I-STAT BETA HCG BLOOD, ED ([facility], [facility], [facility] ONLY)    EKG None  Radiology No results found.  Procedures Procedures (including critical care time)  Medications Ordered in ED Medications - No data to display  ED Course  I have reviewed the triage vital signs and the nursing notes.  Pertinent labs & imaging results that were available during my care of the patient were reviewed by me and considered in my medical decision making (see chart for details).  Clinical Course as of Jul 28 1508  Fri Jul 28, 2019  2125 Reassessed patient, she is very calm, cooperative, states she frequently has similar outbursts after drinking heavily.  States that she has no intention of hurting herself, has not taken any action to hurt herself except for heavy drinking, states she is  able to contract for her safety and strongly desires to go home at this time   [RD]    Clinical Course User Index [RD] Lucrezia Starch, MD   MDM Rules/Calculators/A&P                      39 year old presented to ER with alcohol intoxication, suicidal thoughts.  When I initially examined patient, she was very tearful, reported some suicidal thoughts.  After patient was observed in ER, she became much more calm and cooperative.  She had long conversation with our TTS team.  They feel she is able to contract for safety at this time.  I had a long conversation with her after this was performed.  Agree with TTS assessment.  Will discharge home.  At time of discharge, patient eating drinking, ambulating in department without any difficulty.    After the discussed management above, the patient was determined to be safe for discharge.  The  patient was in agreement with this plan and all questions regarding their care were answered.  ED return precautions were discussed and the patient will return to the ED with any significant worsening of condition.    Final Clinical Impression(s) / ED Diagnoses Final diagnoses:  Alcoholic intoxication without complication (HCC)  ETOH abuse    Rx / DC Orders ED Discharge Orders    None       Milagros Loll, MD 07/29/19 1512

## 2019-07-30 ENCOUNTER — Observation Stay (HOSPITAL_COMMUNITY)
Admission: EM | Admit: 2019-07-30 | Discharge: 2019-07-31 | Disposition: A | Discharge status: Home / Self Care | Payer: Self-pay | Attending: Student in an Organized Health Care Education/Training Program | Admitting: Student in an Organized Health Care Education/Training Program

## 2019-07-30 ENCOUNTER — Encounter (HOSPITAL_COMMUNITY): Payer: Self-pay | Admitting: Student in an Organized Health Care Education/Training Program

## 2019-07-30 ENCOUNTER — Emergency Department (HOSPITAL_COMMUNITY): Payer: Self-pay

## 2019-07-30 ENCOUNTER — Other Ambulatory Visit: Payer: Self-pay

## 2019-07-30 DIAGNOSIS — F17210 Nicotine dependence, cigarettes, uncomplicated: Secondary | ICD-10-CM | POA: Insufficient documentation

## 2019-07-30 DIAGNOSIS — E876 Hypokalemia: Secondary | ICD-10-CM | POA: Insufficient documentation

## 2019-07-30 DIAGNOSIS — F10239 Alcohol dependence with withdrawal, unspecified: Principal | ICD-10-CM | POA: Insufficient documentation

## 2019-07-30 DIAGNOSIS — F10939 Alcohol use, unspecified with withdrawal, unspecified: Secondary | ICD-10-CM

## 2019-07-30 DIAGNOSIS — F10129 Alcohol abuse with intoxication, unspecified: Secondary | ICD-10-CM

## 2019-07-30 DIAGNOSIS — F329 Major depressive disorder, single episode, unspecified: Secondary | ICD-10-CM

## 2019-07-30 DIAGNOSIS — F339 Major depressive disorder, recurrent, unspecified: Secondary | ICD-10-CM | POA: Insufficient documentation

## 2019-07-30 DIAGNOSIS — Z791 Long term (current) use of non-steroidal anti-inflammatories (NSAID): Secondary | ICD-10-CM | POA: Insufficient documentation

## 2019-07-30 DIAGNOSIS — Z6372 Alcoholism and drug addiction in family: Secondary | ICD-10-CM | POA: Insufficient documentation

## 2019-07-30 DIAGNOSIS — F1020 Alcohol dependence, uncomplicated: Secondary | ICD-10-CM | POA: Diagnosis present

## 2019-07-30 DIAGNOSIS — R9431 Abnormal electrocardiogram [ECG] [EKG]: Secondary | ICD-10-CM | POA: Insufficient documentation

## 2019-07-30 DIAGNOSIS — Z20822 Contact with and (suspected) exposure to covid-19: Secondary | ICD-10-CM | POA: Insufficient documentation

## 2019-07-30 DIAGNOSIS — K859 Acute pancreatitis without necrosis or infection, unspecified: Secondary | ICD-10-CM

## 2019-07-30 LAB — CBC
HCT: 33.8 % — ABNORMAL LOW (ref 36.0–46.0)
HCT: 29.7 % — ABNORMAL LOW (ref 36.0–46.0)
Hemoglobin: 11.8 g/dL — ABNORMAL LOW (ref 12.0–15.0)
Hemoglobin: 10.1 g/dL — ABNORMAL LOW (ref 12.0–15.0)
MCH: 33.3 pg (ref 26.0–34.0)
MCH: 33.2 pg (ref 26.0–34.0)
MCHC: 34.9 g/dL (ref 30.0–36.0)
MCHC: 34 g/dL (ref 30.0–36.0)
MCV: 95.5 fL (ref 80.0–100.0)
MCV: 97.7 fL (ref 80.0–100.0)
Platelets: 226 10*3/uL (ref 150–400)
Platelets: 164 10*3/uL (ref 150–400)
RBC: 3.54 MIL/uL — ABNORMAL LOW (ref 3.87–5.11)
RBC: 3.04 MIL/uL — ABNORMAL LOW (ref 3.87–5.11)
RDW: 12.4 % (ref 11.5–15.5)
RDW: 12.5 % (ref 11.5–15.5)
WBC: 6.2 10*3/uL (ref 4.0–10.5)
WBC: 3.6 10*3/uL — ABNORMAL LOW (ref 4.0–10.5)
nRBC: 0 % (ref 0.0–0.2)
nRBC: 0 % (ref 0.0–0.2)

## 2019-07-30 LAB — COMPREHENSIVE METABOLIC PANEL
ALT: 19 U/L (ref 0–44)
AST: 45 U/L — ABNORMAL HIGH (ref 15–41)
Albumin: 4.5 g/dL (ref 3.5–5.0)
Alkaline Phosphatase: 79 U/L (ref 38–126)
Anion gap: 24 — ABNORMAL HIGH (ref 5–15)
BUN: 9 mg/dL (ref 6–20)
CO2: 27 mmol/L (ref 22–32)
Calcium: 8.3 mg/dL — ABNORMAL LOW (ref 8.9–10.3)
Chloride: 80 mmol/L — ABNORMAL LOW (ref 98–111)
Creatinine, Ser: 1.12 mg/dL — ABNORMAL HIGH (ref 0.44–1.00)
GFR calc Af Amer: >60 mL/min (ref >60)
GFR calc non Af Amer: >60 mL/min (ref >60)
Glucose, Bld: 160 mg/dL — ABNORMAL HIGH (ref 70–99)
Potassium: 2.5 mmol/L — CL (ref 3.5–5.1)
Sodium: 131 mmol/L — ABNORMAL LOW (ref 135–145)
Total Bilirubin: 2.3 mg/dL — ABNORMAL HIGH (ref 0.3–1.2)
Total Protein: 7 g/dL (ref 6.5–8.1)

## 2019-07-30 LAB — I-STAT CHEM 8, ED
BUN: 7 mg/dL (ref 6–20)
BUN: 5 mg/dL — ABNORMAL LOW (ref 6–20)
Calcium, Ion: 0.78 mmol/L — CL (ref 1.15–1.40)
Calcium, Ion: 0.9 mmol/L — ABNORMAL LOW (ref 1.15–1.40)
Chloride: 79 mmol/L — ABNORMAL LOW (ref 98–111)
Chloride: 86 mmol/L — ABNORMAL LOW (ref 98–111)
Creatinine, Ser: 0.8 mg/dL (ref 0.44–1.00)
Creatinine, Ser: 0.7 mg/dL (ref 0.44–1.00)
Glucose, Bld: 162 mg/dL — ABNORMAL HIGH (ref 70–99)
Glucose, Bld: 89 mg/dL (ref 70–99)
HCT: 37 % (ref 36.0–46.0)
HCT: 31 % — ABNORMAL LOW (ref 36.0–46.0)
Hemoglobin: 12.6 g/dL (ref 12.0–15.0)
Hemoglobin: 10.5 g/dL — ABNORMAL LOW (ref 12.0–15.0)
Potassium: 2.5 mmol/L — CL (ref 3.5–5.1)
Potassium: 2.1 mmol/L — CL (ref 3.5–5.1)
Sodium: 129 mmol/L — ABNORMAL LOW (ref 135–145)
Sodium: 130 mmol/L — ABNORMAL LOW (ref 135–145)
TCO2: 34 mmol/L — ABNORMAL HIGH (ref 22–32)
TCO2: 31 mmol/L (ref 22–32)

## 2019-07-30 LAB — BASIC METABOLIC PANEL
Anion gap: 13 (ref 5–15)
Anion gap: 7 (ref 5–15)
BUN: 5 mg/dL — ABNORMAL LOW (ref 6–20)
BUN: <5 mg/dL — ABNORMAL LOW (ref 6–20)
CO2: 29 mmol/L (ref 22–32)
CO2: 31 mmol/L (ref 22–32)
Calcium: 7.3 mg/dL — ABNORMAL LOW (ref 8.9–10.3)
Calcium: 7.7 mg/dL — ABNORMAL LOW (ref 8.9–10.3)
Chloride: 91 mmol/L — ABNORMAL LOW (ref 98–111)
Chloride: 97 mmol/L — ABNORMAL LOW (ref 98–111)
Creatinine, Ser: 0.69 mg/dL (ref 0.44–1.00)
Creatinine, Ser: 0.78 mg/dL (ref 0.44–1.00)
GFR calc Af Amer: >60 mL/min (ref >60)
GFR calc Af Amer: >60 mL/min (ref >60)
GFR calc non Af Amer: >60 mL/min (ref >60)
GFR calc non Af Amer: >60 mL/min (ref >60)
Glucose, Bld: 86 mg/dL (ref 70–99)
Glucose, Bld: 91 mg/dL (ref 70–99)
Potassium: 2.4 mmol/L — CL (ref 3.5–5.1)
Potassium: 2.7 mmol/L — CL (ref 3.5–5.1)
Sodium: 133 mmol/L — ABNORMAL LOW (ref 135–145)
Sodium: 135 mmol/L (ref 135–145)

## 2019-07-30 LAB — MAGNESIUM: Magnesium: 1.6 mg/dL — ABNORMAL LOW (ref 1.7–2.4)

## 2019-07-30 LAB — CBG MONITORING, ED: Glucose-Capillary: 169 mg/dL — ABNORMAL HIGH (ref 70–99)

## 2019-07-30 LAB — ETHANOL: Alcohol, Ethyl (B): <10 mg/dL (ref <10)

## 2019-07-30 LAB — LIPASE, BLOOD: Lipase: 18 U/L (ref 11–51)

## 2019-07-30 LAB — POC SARS CORONAVIRUS 2 AG -  ED: SARS Coronavirus 2 Ag: NEGATIVE

## 2019-07-30 LAB — RESPIRATORY PANEL BY RT PCR (FLU A&B, COVID)
Influenza A by PCR: NEGATIVE
Influenza B by PCR: NEGATIVE
SARS Coronavirus 2 by RT PCR: NEGATIVE

## 2019-07-30 LAB — I-STAT BETA HCG BLOOD, ED (MC, WL, AP ONLY): I-stat hCG, quantitative: <5 m[IU]/mL (ref <5)

## 2019-07-30 LAB — CK: Total CK: 1197 U/L — ABNORMAL HIGH (ref 38–234)

## 2019-07-30 MED ORDER — POTASSIUM CHLORIDE 20 MEQ/15ML (10%) PO SOLN
40.0000 meq | Freq: Once | ORAL | Status: AC
  Administered 2019-07-30: 40 meq via ORAL
  Filled 2019-07-30: qty 30

## 2019-07-30 MED ORDER — ENOXAPARIN SODIUM 40 MG/0.4ML ~~LOC~~ SOLN: 40.0000 mg | SUBCUTANEOUS | Status: DC

## 2019-07-30 MED ORDER — CHLORDIAZEPOXIDE HCL 25 MG PO CAPS: 25.0000 mg | ORAL_CAPSULE | Freq: Once | ORAL | Status: DC

## 2019-07-30 MED ORDER — ACETAMINOPHEN 325 MG PO TABS: 650.0000 mg | ORAL_TABLET | Freq: Four times a day (QID) | ORAL | Status: DC | PRN

## 2019-07-30 MED ORDER — SODIUM CHLORIDE 0.9 % IV BOLUS
1000.0000 mL | Freq: Once | INTRAVENOUS | Status: AC
  Administered 2019-07-30: 1000 mL via INTRAVENOUS

## 2019-07-30 MED ORDER — TRAZODONE HCL 50 MG PO TABS
50.0000 mg | ORAL_TABLET | Freq: Every day | ORAL | Status: DC
  Administered 2019-07-30: 50 mg via ORAL
  Filled 2019-07-30: qty 1

## 2019-07-30 MED ORDER — LORAZEPAM 1 MG PO TABS: 0.0000 mg | ORAL_TABLET | Freq: Two times a day (BID) | ORAL | Status: DC

## 2019-07-30 MED ORDER — FOLIC ACID 1 MG PO TABS
1.0000 mg | ORAL_TABLET | Freq: Every day | ORAL | Status: DC
  Administered 2019-07-31: 1 mg via ORAL
  Filled 2019-07-30: qty 1

## 2019-07-30 MED ORDER — LORAZEPAM 1 MG PO TABS: 0.0000 mg | ORAL_TABLET | Freq: Four times a day (QID) | ORAL | Status: DC

## 2019-07-30 MED ORDER — SENNOSIDES-DOCUSATE SODIUM 8.6-50 MG PO TABS: 1.0000 | ORAL_TABLET | Freq: Every evening | ORAL | Status: DC | PRN

## 2019-07-30 MED ORDER — MAGNESIUM SULFATE 4 GM/100ML IV SOLN
4.0000 g | Freq: Once | INTRAVENOUS | Status: AC
  Administered 2019-07-30: 4 g via INTRAVENOUS
  Filled 2019-07-30: qty 100

## 2019-07-30 MED ORDER — THIAMINE HCL 100 MG/ML IJ SOLN
100.0000 mg | Freq: Every day | INTRAMUSCULAR | Status: DC
  Administered 2019-07-30: 100 mg via INTRAVENOUS
  Filled 2019-07-30: qty 2

## 2019-07-30 MED ORDER — POTASSIUM CHLORIDE 20 MEQ/15ML (10%) PO SOLN
40.0000 meq | ORAL | Status: AC
  Administered 2019-07-30 – 2019-07-31 (×2): 40 meq via ORAL
  Filled 2019-07-30 (×2): qty 30

## 2019-07-30 MED ORDER — LORAZEPAM 2 MG/ML IJ SOLN: 0.0000 mg | Freq: Two times a day (BID) | INTRAMUSCULAR | Status: DC

## 2019-07-30 MED ORDER — ADULT MULTIVITAMIN W/MINERALS CH
1.0000 | ORAL_TABLET | Freq: Every day | ORAL | Status: DC
  Administered 2019-07-31: 1 via ORAL
  Filled 2019-07-30: qty 1

## 2019-07-30 MED ORDER — LORAZEPAM 2 MG/ML IJ SOLN
0.0000 mg | Freq: Four times a day (QID) | INTRAMUSCULAR | Status: DC
  Administered 2019-07-30: 1 mg via INTRAVENOUS
  Filled 2019-07-30: qty 1

## 2019-07-30 MED ORDER — POTASSIUM CHLORIDE 10 MEQ/100ML IV SOLN
10.0000 meq | INTRAVENOUS | Status: AC
  Administered 2019-07-30 (×3): 10 meq via INTRAVENOUS
  Filled 2019-07-30 (×3): qty 100

## 2019-07-30 MED ORDER — ACETAMINOPHEN 650 MG RE SUPP: 650.0000 mg | Freq: Four times a day (QID) | RECTAL | Status: DC | PRN

## 2019-07-30 MED ORDER — THIAMINE HCL 100 MG PO TABS
100.0000 mg | ORAL_TABLET | Freq: Every day | ORAL | Status: DC
  Administered 2019-07-31: 100 mg via ORAL
  Filled 2019-07-30: qty 1

## 2019-07-30 MED ORDER — LORAZEPAM 2 MG/ML IJ SOLN
1.0000 mg | Freq: Once | INTRAMUSCULAR | Status: AC
  Administered 2019-07-30: 1 mg via INTRAVENOUS
  Filled 2019-07-30: qty 1

## 2019-07-30 MED ORDER — CHLORDIAZEPOXIDE HCL 25 MG PO CAPS
50.0000 mg | ORAL_CAPSULE | Freq: Two times a day (BID) | ORAL | Status: DC
  Administered 2019-07-30 – 2019-07-31 (×2): 50 mg via ORAL
  Filled 2019-07-30 (×2): qty 2

## 2019-07-30 MED ORDER — POTASSIUM CHLORIDE 20 MEQ/15ML (10%) PO SOLN: 40.0000 meq | Freq: Once | ORAL | Status: DC

## 2019-07-30 NOTE — Progress Notes (Signed)
Potassium level is 2.7. RN messaged IMTS, Christian, to make aware.   Larey Days, RN

## 2019-07-30 NOTE — ED Provider Notes (Signed)
[facility] EM[facility]GENC[facility] Provider Note   CSN: [id number] Arrival date & time: 07/30/19  1007     History Chief Complaint  Patient presents with  . Chest Pain    Sheila Graves is a 39 y.o. female.  HPI 39 year old female history of alcohol abuse, alcohol withdrawal syndrome, hypokalemia, pancreatitis presents today complaining of 2 days of nausea and vomiting.  She states she has not drank alcohol for several days so she has been trying to quit.  She has vomited multiple times.  She is complaining of her hands being drawn up which she associates with her hypokalemia.  She denies any headache, head injury, cough, Covid exposure.  EMS reports that she did have a fever.    Past Medical History:  Diagnosis Date  . Alcohol withdrawal seizure ([place])   . ETOH abuse   . Hypokalemia   . Pancreatitis   . Thyroid disease     Patient Active Problem List   Diagnosis Date Noted  . Alcohol-induced mood disorder ([place]) 04/05/2019  . Marital conflict 54/56/2563  . Alcohol withdrawal syndrome with complication, with unspecified complication ([place]) 89/37/3428  . Acute metabolic encephalopathy 76/81/1572  . Rhabdomyolysis 03/21/2019  . Alcohol use disorder, severe, dependence ([place])   . Prolonged QT interval 03/16/2019  . Metabolic acidosis 62/08/5595  . AKI (acute kidney injury) ([place])   . Transaminitis   . Dehydration 03/15/2019  . Alcohol intoxication with moderate or severe use disorder ([place]) 02/04/2019  . Hypokalemia 02/04/2019  . Hypothermia 02/04/2019  . Hypotension 02/04/2019  . Fracture of clavicle, right, open 11/11/2018  . Major depressive disorder, recurrent, severe without psychotic features ([place]) 01/31/2018    Past Surgical History:  Procedure Laterality Date  . FINGER SURGERY    . IRRIGATION AND DEBRIDEMENT SHOULDER Right 11/12/2018   Procedure: Irrigation And Debridement Shoulder;  Surgeon: Shona Needles, MD;  Location: [employer];  Service:  Orthopedics;  Laterality: Right;  . ORIF CLAVICULAR FRACTURE Right 11/12/2018   Procedure: OPEN REDUCTION INTERNAL FIXATION (ORIF) CLAVICULAR FRACTURE;  Surgeon: Shona Needles, MD;  Location: [facility];  Service: Orthopedics;  Laterality: Right;     OB History   No obstetric history on file.     Family History  Problem Relation Age of Onset  . Alcohol abuse Other     Social History   Tobacco Use  . Smoking status: Current Every Day Smoker    Packs/day: 1.00  . Smokeless tobacco: Never Used  Substance Use Topics  . Alcohol use: Yes    Comment: Last drink: 7pm   . Drug use: Not Currently    Types: Heroin    Home Medications Prior to Admission medications   Medication Sig Start Date End Date Taking? Authorizing Provider  escitalopram (LEXAPRO) 10 MG tablet Take 1 tablet (10 mg total) by mouth daily. 03/22/19   Dhungel, Flonnie Overman, MD  meloxicam (MOBIC) 7.5 MG tablet Take 1 tablet (7.5 mg total) by mouth daily. 03/23/19   Ok Edwards, PA-C    Allergies    Patient has no known allergies.  Review of Systems   Review of Systems  Physical Exam Updated Vital Signs There were no vitals taken for this visit.  Physical Exam  ED Results / Procedures / Treatments   Labs (all labs ordered are listed, but only abnormal results are displayed) Labs Reviewed  ETHANOL  CBC  COMPREHENSIVE METABOLIC PANEL  I-STAT CHEM 8, ED  POC SARS CORONAVIRUS 2 AG -  ED  I-STAT BETA HCG BLOOD, ED ([facility], [facility], [facility] ONLY)    EKG EKG Interpretation  Date/Time:  Sunday July 30 2019 10:21:42 EST Ventricular Rate:  126 PR Interval:    QRS Duration: 90 QT Interval:  343 QTC Calculation: 497 R Axis:   80 Text Interpretation: Sinus tachycardia Non-specific ST-t changes Confirmed by Margarita Grizzle [id number]) on 07/30/2019 11:12:59 AM   Radiology No results found.  Procedures Procedures (including critical care time)  Medications Ordered in ED Medications  sodium chloride 0.9 % bolus 1,000 mL (has no  administration in time range)    ED Course  I have reviewed the triage vital signs and the nursing notes.  Pertinent labs & imaging results that were available during my care of the patient were reviewed by me and considered in my medical decision making (see chart for details). 12:09 PM IV fluids ,potassium infusing.  Patient received iv ativan.  1:02 PM Patient feels improved.  HR in 90s, decreased hand contractures Plan po fluid trial .  Recheck istat  Repeat potassium checked and 3.1.  This is after receiving 3 runs and p.o. potassium.  Will admit for continued repletion EKG changes consistent with hypokalemia  Discussed with IM teaching service team and will see for admission   MDM Rules/Calculators/A&P                       Final Clinical Impression(s) / ED Diagnoses Final diagnoses:  Hypokalemia  Abnormal EKG  Alcohol withdrawal syndrome with complication [facility])    Rx / DC Orders ED Discharge Orders    None       Margarita Grizzle, MD 07/30/19 1600

## 2019-07-30 NOTE — ED Notes (Signed)
Report called to [facility] 78M RN

## 2019-07-30 NOTE — H&P (Addendum)
Date: 07/30/2019               Patient Name:  Sheila Graves MRN: [id number]  DOB: 1980-08-24 Age / Sex: 39 y.o., female   PCP: Patient, No Pcp Per         Medical Service: Internal Medicine Teaching Service         Attending Physician: Dr. Oswaldo Done, Marquita Palms, *    First Contact: Dr. Huel Cote Pager: [phone]  Second Contact: Dr. Delma Officer Pager: [phone]       After Hours (After 5p/  First Contact Pager: [phone]  weekends / holidays): Second Contact Pager: [phone]   Chief Complaint: Nausea, vomiting, CP, contractures   History of Present Illness:  Ms. Sheila Graves is a 39 y/o female with a PMHx of alcohol use disorder, pancreatitis, who presents to the ED with c/o nausea, vomiting, CP and contractures.   Ms. Sheila Graves states she was recently at the [facility] ED for alcohol intoxication approximately 2 days ago. Since then, she has been working on quitting drinking but it has been complicated by severe nausea and vomiting that has made it difficult to keep down any fluids. Last night, she had fever of 100 and chills but none today. She also has been having significant muscle cramps and tightness in her abdomen, bilateral arms and legs and chest area this AM. The areas of muscle cramps were TTP but arms, legs and chest cramps have resolved now. She also endorses palpitations and diaphoresis this AM that has resolved. Ms. Sheila Graves denies diarrhea.   Sheila Graves reports that in the past, she has suffered from seizures and DTs when withdrawing from alcohol. She notes a recent ICU stay in September of this year due to alcohol overdose. She is very interested and motivated to quit and would like resource assistance from social work.   Meds:  Lexapro 10 mg QD, per chart review   Allergies: Allergies as of 07/30/2019  . (No Known Allergies)   Past Medical History:  Diagnosis Date  . Alcohol withdrawal seizure (HCC)   . ETOH abuse   . Hypokalemia   . Pancreatitis   . Thyroid disease    Family  History:  Grandfather - Alcohol Use disorder Father - HTN  Social History:  Denies drug and tobacco use Alcohol: AUD for 12 years now, last drink 48 hours ago, usually liquor   Review of Systems: A complete ROS was negative except as per HPI.  Physical Exam: Blood pressure 109/79, pulse 75, temperature 98.6 F (37 C), resp. rate 18, height 5\' 5"  (1.651 m), weight 60.4 kg, SpO2 98 %.  Physical Exam Vitals and nursing note reviewed.  Constitutional:      General: She is not in acute distress.    Appearance: She is normal weight.  HENT:     Head: Normocephalic and atraumatic.  Eyes:     Pupils: Pupils are equal, round, and reactive to light.  Cardiovascular:     Rate and Rhythm: Normal rate and regular rhythm.     Heart sounds: Normal heart sounds. Heart sounds not distant. No systolic murmur.  Pulmonary:     Effort: Pulmonary effort is normal. No respiratory distress.     Breath sounds: No decreased breath sounds, wheezing, rhonchi or rales.  Abdominal:     General: Bowel sounds are normal. There is no distension.     Palpations: Abdomen is soft.     Tenderness: There is no abdominal tenderness. There is no guarding.  Musculoskeletal:        General: No deformity.     Right lower leg: No edema.     Left lower leg: No edema.  Skin:    General: Skin is warm.     Findings: Bruising (In varying stages of healing, diffusely) present.  Neurological:     General: No focal deficit present.     Mental Status: She is alert and oriented to person, place, and time.  Psychiatric:        Mood and Affect: Mood normal.        Behavior: Behavior normal.    EKG: personally reviewed my interpretation is: Significant motion artifact. Sinus tachycardia with rate of 126. No ischemic changes.   CXR: personally reviewed my interpretation is: No opacities or consolidations. No pleural effusions.   Assessment & Plan by Problem: Active Problems:   Hypokalemia  Ms. Sheila Graves is a 39 y/o  female with a PMHx of alcohol use disorder, pancreatitis who is currently admitted for hypokalemia in light of nausea, vomiting.   # Hypokalemia  Ms. Sheila Graves is in the process of quitting alcohol use, which appears to have unfortunately led to severe nausea and vomiting. Initial potassium on presentation was 2.5 and has decreased to 2.1 despite IV and PO replenishment, although she only received 30 mEq IV after throwing up the oral solution. CK elevated at 1197, which was likely higher as patient had already received IVF (1.3 L). Now that she is tolerating diet, will monitor closely and hold off on additional IVF. Magnesium was low as well, will replenish PRN.   - Telemetry  - KCl 40 mEq solution x 2 - Magnesium 4 g IV once  - BMP q4h  - CK in the AM - Magnesium level in the AM  # Alcohol Use Disorder Long term use of daily alcohol with history of prior seizures and DTs places patient at high risk of such reoccurrence.   - CIWA with Ativan PRN - Librium 50 mg BID - TOC consult for resources - Thiamine and folate   Code: FULL Diet: Regular  DVT ppx: Lovenox   Dispo: Admit patient to Inpatient with expected length of stay greater than 2 midnights.  Signed: Dr. Jose Persia Internal Medicine PGY-1  Pager: [phone] 07/30/2019, 7:29 PM

## 2019-07-30 NOTE — ED Triage Notes (Addendum)
Pt presents to ED via [facility] w/nausea, vomiting, chest pain and contractures to BLE and BUE. Pt states this happens when her K+ is low. Pt seen at [facility] two days for ETOH  Pt with mid sternum chest tightness and generalized body pain starting today with runs of SVT  20g LH  Pt received PTA  4mg  Zofran  324mg  ASA  0.4 nitro x1 200 cc NS

## 2019-07-31 DIAGNOSIS — Z7289 Other problems related to lifestyle: Secondary | ICD-10-CM

## 2019-07-31 LAB — PHOSPHORUS: Phosphorus: 1.6 mg/dL — ABNORMAL LOW (ref 2.5–4.6)

## 2019-07-31 LAB — BASIC METABOLIC PANEL
Anion gap: 8 (ref 5–15)
Anion gap: 10 (ref 5–15)
BUN: <5 mg/dL — ABNORMAL LOW (ref 6–20)
BUN: <5 mg/dL — ABNORMAL LOW (ref 6–20)
CO2: 24 mmol/L (ref 22–32)
CO2: 26 mmol/L (ref 22–32)
Calcium: 8 mg/dL — ABNORMAL LOW (ref 8.9–10.3)
Calcium: 8.6 mg/dL — ABNORMAL LOW (ref 8.9–10.3)
Chloride: 103 mmol/L (ref 98–111)
Chloride: 100 mmol/L (ref 98–111)
Creatinine, Ser: 0.59 mg/dL (ref 0.44–1.00)
Creatinine, Ser: 0.79 mg/dL (ref 0.44–1.00)
GFR calc Af Amer: >60 mL/min (ref >60)
GFR calc Af Amer: >60 mL/min (ref >60)
GFR calc non Af Amer: >60 mL/min (ref >60)
GFR calc non Af Amer: >60 mL/min (ref >60)
Glucose, Bld: 90 mg/dL (ref 70–99)
Glucose, Bld: 84 mg/dL (ref 70–99)
Potassium: 4.4 mmol/L (ref 3.5–5.1)
Potassium: 3.3 mmol/L — ABNORMAL LOW (ref 3.5–5.1)
Sodium: 135 mmol/L (ref 135–145)
Sodium: 136 mmol/L (ref 135–145)

## 2019-07-31 LAB — CBC
HCT: 31.1 % — ABNORMAL LOW (ref 36.0–46.0)
Hemoglobin: 10.5 g/dL — ABNORMAL LOW (ref 12.0–15.0)
MCH: 33.3 pg (ref 26.0–34.0)
MCHC: 33.8 g/dL (ref 30.0–36.0)
MCV: 98.7 fL (ref 80.0–100.0)
Platelets: 184 10*3/uL (ref 150–400)
RBC: 3.15 MIL/uL — ABNORMAL LOW (ref 3.87–5.11)
RDW: 12.9 % (ref 11.5–15.5)
WBC: 2.5 10*3/uL — ABNORMAL LOW (ref 4.0–10.5)
nRBC: 0 % (ref 0.0–0.2)

## 2019-07-31 LAB — MAGNESIUM: Magnesium: 2.7 mg/dL — ABNORMAL HIGH (ref 1.7–2.4)

## 2019-07-31 LAB — GLUCOSE, CAPILLARY: Glucose-Capillary: 123 mg/dL — ABNORMAL HIGH (ref 70–99)

## 2019-07-31 MED ORDER — CHLORDIAZEPOXIDE HCL 25 MG PO CAPS
25.0000 mg | ORAL_CAPSULE | Freq: Once | ORAL | Status: AC
  Administered 2019-07-31: 25 mg via ORAL
  Filled 2019-07-31: qty 1

## 2019-07-31 MED ORDER — POTASSIUM CHLORIDE CRYS ER 20 MEQ PO TBCR
40.0000 meq | EXTENDED_RELEASE_TABLET | Freq: Once | ORAL | Status: AC
  Administered 2019-07-31: 40 meq via ORAL
  Filled 2019-07-31: qty 2

## 2019-07-31 MED ORDER — POTASSIUM & SODIUM PHOSPHATES 280-160-250 MG PO PACK: 2.0000 | PACK | ORAL | 0 refills | Status: AC

## 2019-07-31 MED ORDER — POTASSIUM & SODIUM PHOSPHATES 280-160-250 MG PO PACK
2.0000 | PACK | ORAL | Status: DC
  Filled 2019-07-31 (×2): qty 2

## 2019-07-31 MED ORDER — CHLORDIAZEPOXIDE HCL 25 MG PO CAPS: 25.0000 mg | ORAL_CAPSULE | Freq: Two times a day (BID) | ORAL | Status: DC

## 2019-07-31 MED ORDER — POTASSIUM & SODIUM PHOSPHATES 280-160-250 MG PO PACK
2.0000 | PACK | ORAL | Status: DC
  Administered 2019-07-31: 2 via ORAL
  Filled 2019-07-31 (×3): qty 2

## 2019-07-31 MED ORDER — NALTREXONE HCL 50 MG PO TABS: 50.0000 mg | ORAL_TABLET | Freq: Every day | ORAL | 0 refills | Status: DC

## 2019-07-31 MED ORDER — CHLORDIAZEPOXIDE HCL 25 MG PO CAPS: ORAL_CAPSULE | ORAL | 0 refills | Status: AC

## 2019-07-31 MED FILL — CHLORDIAZEPOXIDE 25 MG CAP: 25 | 2 days supply | Qty: 4 | Fill #0

## 2019-07-31 MED FILL — PHOS-NAK PACKET: 280-160-250 | 2 days supply | Qty: 6 | Fill #0

## 2019-07-31 MED FILL — NALTREXONE 50 MG TABLET: 50 | 30 days supply | Qty: 30 | Fill #0

## 2019-07-31 NOTE — Discharge Summary (Signed)
Name: Sheila Graves MRN: [id number] DOB: [birth year] 39 y.o. PCP: Patient, No Pcp Per  Date of Admission: 07/30/2019 10:07 AM Date of Discharge: 07/31/2019 Attending Physician: Lalla Brothers, MD FACP Discharge Diagnosis: 1. Hypokalemia 2. Alcohol use disorder  Discharge Medications: Allergies as of 07/31/2019   No Known Allergies     Medication List    TAKE these medications   chlordiazePOXIDE 25 MG capsule Commonly known as: LIBRIUM Take 1 capsule (25 mg total) by mouth 2 (two) times daily for 1 day, THEN 1 capsule (25 mg total) daily for 1 day. Start taking on: August 01, 2019   escitalopram 10 MG tablet Commonly known as: LEXAPRO Take 1 tablet (10 mg total) by mouth daily.   meloxicam 7.5 MG tablet Commonly known as: Mobic Take 1 tablet (7.5 mg total) by mouth daily.   naltrexone 50 MG tablet Commonly known as: DEPADE Take 1 tablet (50 mg total) by mouth daily.   potassium & sodium phosphates 280-160-250 MG Pack Commonly known as: PHOS-NAK Take 2 packets by mouth every 4 (four) hours for 3 doses.       Disposition and follow-up:   [name] was discharged from [facility] in Stable condition.  At the hospital follow up visit please address:  1.  Patient was discharged with 1 month supply of naltrexone. Please evaluate effectiveness and consider continuing to prescribe this medication. Patient is interested in mental health resources/counseling.  2.  Labs / imaging needed at time of follow-up: BMP, Magnesium, Phosphorus   3.  Pending labs/ test needing follow-up: None  Follow-up Appointments: Follow-up [place] Follow up on 08/09/2019.   Why: 10:15am Contact information: [address] [place] [facility] Course by problem list: # Alcohol use disorder # Hypokalemia # Hypophosphatemia Patient presented on 07/30/19 with intractable nausea and  vomiting in the setting of recent heavy alcohol intake and was found to have a potassium of 2.5 along with hypokalemic symptoms.  Patient also had a low magnesium of 1.6 and phosphorus of 1.6.  Electrolytes were repleted and patient was placed on CIWA protocol.  On day of discharge (07/31/2019), patient was stable with resolution of symptoms and tolerating p.o. intake.  Patient was discharged with Librium (2 days) to complete taper and with a 1 month supply of naltrexone.  Patient was evaluated by social work and provided with a handout on alcohol cessation resources.  Discharge Vitals:   BP 111/77 (BP Location: Right Arm)   Pulse 81   Temp 97.8 F (36.6 C) (Oral)   Resp 18   Ht 5\' 5"  (1.651 m)   Wt 58.5 kg   SpO2 100%   BMI 21.46 kg/m   Pertinent Labs, Studies, and Procedures:  BMP Latest Ref Rng & Units 07/31/2019 07/31/2019 07/30/2019  Glucose 70 - 99 mg/dL 84 90 91  BUN 6 - 20 mg/dL <5(L) <5(L) <5(L)  Creatinine 0.44 - 1.00 mg/dL 0.79 0.59 0.78  Sodium 135 - 145 mmol/L 136 135 135  Potassium 3.5 - 5.1 mmol/L 3.3(L) 4.4 2.7(LL)  Chloride 98 - 111 mmol/L 100 103 97(L)  CO2 22 - 32 mmol/L 26 24 31   Calcium 8.9 - 10.3 mg/dL 8.6(L) 8.0(L) 7.7(L)   Magnesium: 1.6 (07/30/19) -> 2.7 (07/31/19) Phosphorus: 1.6 (07/31/19)   Discharge Instructions: Discharge Instructions    Call MD for:  persistant dizziness or light-headedness  Complete by: As directed    Call MD for:  persistant nausea and vomiting   Complete by: As directed    Call MD for:  severe uncontrolled pain   Complete by: As directed    Diet - low sodium heart healthy   Complete by: As directed    Discharge instructions   Complete by: As directed    You were seen in clinic for low potassium and alcohol withdrawal symptoms. We have provided you with medications to help with the alcohol withdrawal. We want you to take the librium 25 mg twice a day tomorrow (Tuesday) and then take librium 25 mg once on Wednesday. You can then start  taking naltrexone which should help with your cravings for alcohol. Your phosphorous was also low and we have provided you with phosphorus supplements. Please take these as prescribed. We have also set you up with a followup appointment in our internal medicine clinic where we can check your electrolytes and help you get the care that you need. Thank you for allowing [place] to be part of your medical care!   Increase activity slowly   Complete by: As directed       Signed: Katherine Roan, MD 08/01/2019, 5:41 AM   Pager: [phone]

## 2019-07-31 NOTE — Discharge Instructions (Addendum)
Alcohol Withdrawal Syndrome When a person who drinks a lot of alcohol stops drinking, he or she may have unpleasant and serious symptoms. These symptoms are called alcohol withdrawal syndrome. This condition may be mild or severe. It can be life-threatening. It can cause:  Shaking that you cannot control (tremor).  Sweating.  Headache.  Feeling fearful, upset, grouchy, or depressed.  Trouble sleeping (insomnia).  Nightmares.  Fast or uneven heartbeats (palpitations).  Alcohol cravings.  Feeling sick to your stomach (nausea).  Throwing up (vomiting).  Being bothered by light and sounds.  Confusion.  Trouble thinking clearly.  Not being hungry (loss of appetite).  Big changes in mood (mood swings). If you have all of the following symptoms at the same time, get help right away:  High blood pressure.  Fast heartbeat.  Trouble breathing.  Seizures.  Seeing, hearing, feeling, smelling, or tasting things that are not there (hallucinations). These symptoms are known as delirium tremens (DTs). They must be treated at the hospital right away. Follow these instructions at home:   Take over-the-counter and prescription medicines only as told by your doctor. This includes vitamins.  Do not drink alcohol.  Do not drive until your doctor says that this is safe for you.  Have someone stay with you or be available in case you need help. This should be someone you trust. This person can help you with your symptoms. He or she can also help you to not drink.  Drink enough fluid to keep your pee (urine) pale yellow.  Think about joining a support group or a treatment program to help you stop drinking.  Keep all follow-up visits as told by your doctor. This is important. Contact a doctor if:  Your symptoms get worse.  You cannot eat or drink without throwing up.  You have a hard time not drinking alcohol.  You cannot stop drinking alcohol. Get help right away  if:  You have fast or uneven heartbeats.  You have chest pain.  You have trouble breathing.  You have a seizure for the first time.  You see, hear, feel, smell, or taste something that is not there.  You get very confused. Summary  When a person who drinks a lot of alcohol stops drinking, he or she may have serious symptoms. This is called alcohol withdrawal syndrome.  Delirium tremens (DTs) is a group of life-threatening symptoms. You should get help right away if you have these symptoms.  Think about joining an alcohol support group or a treatment program. This information is not intended to replace advice given to you by your health care provider. Make sure you discuss any questions you have with your health care provider. Document Revised: 05/28/2017 Document Reviewed: 02/19/2017 Elsevier Patient Education  2020 [employer] Hypokalemia Hypokalemia means that the amount of potassium in the blood is lower than normal. Potassium is a chemical (electrolyte) that helps regulate the amount of fluid in the body. It also stimulates muscle tightening (contraction) and helps nerves work properly. Normally, most of the body's potassium is inside cells, and only a very small amount is in the blood. Because the amount in the blood is so small, minor changes to potassium levels in the blood can be life-threatening. What are the causes? This condition may be caused by:  Antibiotic medicine.  Diarrhea or vomiting. Taking too much of a medicine that helps you have a bowel movement (laxative) can cause diarrhea and lead to hypokalemia.  Chronic kidney disease (CKD).  Medicines that help the body get rid of excess fluid (diuretics).  Eating disorders, such as bulimia.  Low magnesium levels in the body.  Sweating a lot. What are the signs or symptoms? Symptoms of this condition include:  Weakness.  Constipation.  Fatigue.  Muscle cramps.  Mental confusion.  Skipped  heartbeats or irregular heartbeat (palpitations).  Tingling or numbness. How is this diagnosed? This condition is diagnosed with a blood test. How is this treated? This condition may be treated by:  Taking potassium supplements by mouth.  Adjusting the medicines that you take.  Eating more foods that contain a lot of potassium. If your potassium level is very low, you may need to get potassium through an IV and be monitored in the hospital. Follow these instructions at home:   Take over-the-counter and prescription medicines only as told by your health care provider. This includes vitamins and supplements.  Eat a healthy diet. A healthy diet includes fresh fruits and vegetables, whole grains, healthy fats, and lean proteins.  If instructed, eat more foods that contain a lot of potassium. This includes: ? Nuts, such as peanuts and pistachios. ? Seeds, such as sunflower seeds and pumpkin seeds. ? Peas, lentils, and lima beans. ? Whole grain and bran cereals and breads. ? Fresh fruits and vegetables, such as apricots, avocado, bananas, cantaloupe, kiwi, oranges, tomatoes, asparagus, and potatoes. ? Orange juice. ? Tomato juice. ? Red meats. ? Yogurt.  Keep all follow-up visits as told by your health care provider. This is important. Contact a health care provider if you:  Have weakness that gets worse.  Feel your heart pounding or racing.  Vomit.  Have diarrhea.  Have diabetes (diabetes mellitus) and you have trouble keeping your blood sugar (glucose) in your target range. Get help right away if you:  Have chest pain.  Have shortness of breath.  Have vomiting or diarrhea that lasts for more than 2 days.  Faint. Summary  Hypokalemia means that the amount of potassium in the blood is lower than normal.  This condition is diagnosed with a blood test.  Hypokalemia may be treated by taking potassium supplements, adjusting the medicines that you take, or eating more  foods that are high in potassium.  If your potassium level is very low, you may need to get potassium through an IV and be monitored in the hospital. This information is not intended to replace advice given to you by your health care provider. Make sure you discuss any questions you have with your health care provider. Document Revised: 01/26/2018 Document Reviewed: 01/26/2018 Elsevier Patient Education  2020 [employer].   Finding Treatment for Addiction Addiction is a complex disease of the brain that causes an uncontrollable (compulsive) need for:  A substance. This includes alcohol, illegal drugs, or prescription medicines, such as painkillers.  An activity or behavior, such as gambling or shopping. Addiction changes the way your brain works. Because of this change:  The need for the medicine, drug, or activity can become so strong that you think about it all the time.  Getting more and more of your addiction becomes the most important thing to you.  You may find yourself leaving other activities and relationships to pursue your addiction.  You can become physically dependent on a substance.  Your health, behavior, emotions, and relationships can change for the worse. How do I know if I need treatment for addiction? Addiction is a progressive disease. Without treatment, addiction can get worse. Living with addiction  puts you at higher risk for injury, poor health, loss of employment, loss of money, and even death. You might need treatment for addiction if:  You have tried to stop or cut down, but you have not succeeded.  You find it annoying that your friends and family are concerned about your use or behavior.  You feel guilty about your use or behavior.  You need a particular substance or activity to start your day or to calm down.  You are running out of money because of your addiction.  You have done something illegal to support your addiction.  Your addiction has  caused you: ? Health problems. ? Trouble in school, work, home, or with the police. ? To devote all your time to your addiction, and not to other responsibilities. ? To tell lies in order to hide your problem. What types of treatment are available?  There may be options for treatment programs and plans based on your addiction, condition, needs, and preferences. No single treatment is right for everyone.  Treatment programs can be: ? Outpatient. You live at home and go to work or school, but you go to a clinic for treatment. ? Inpatient. You live and sleep at the program facility during treatment.  Programs may include: ? Medicine. You may need medicine to treat the addiction itself, or to treat anxiety or depression. ? Counseling and behavior therapy. This can help individuals and families behave in healthier ways and relate more effectively. ? Support groups. Confidential group therapy, such as a 12-step program, can help individuals and families during treatment and recovery. ? A combination of education, counseling, and a 12-step, spirituality-based approach. What should I consider when selecting a treatment program? Think about your individual requirements when selecting a treatment program. Ask about:  The overall approach to treatment. ? Some programs are strictly 12-step programs. Some have a more flexible approach. ? Programs may differ in length of stay, setting, and size. ? Some programs include your family in your treatment plan. Support may be offered to them throughout the treatment process, as well as instructions for them when you are discharged. ? You may continue to receive support after you have left the program.  The types of medical services that are offered. Find out if the program: ? Offers specific treatment for your particular addiction. ? Meets all of your needs, including physical and cultural needs. ? Includes any medicines you might need. ? Offers mental  health counseling as part of your treatment. ? Offers the 12-step meetings at the center, or if transport is available for patients to attend meetings at other locations.  The cost and types of insurance that are accepted. ? Some programs are sponsored by the government. They support patients who do not have private insurance. ? If you do not have insurance, or if you choose to attend a program that does not accept your insurance, call the treatment center. Tell them your financial needs and whether a payment plan can be set up. ? There are also organizations that will help you find the resources for treatment. You can find them online by searching "treatment for addiction."  If the program is certified by the appropriate government agency. Where to find support  Your health care provider can help you to find the right treatment. These discussions are confidential.  The [employer] on Alcoholism and Drug Dependence (NCADD). This group has information about treatment centers and programs for people who have an addiction and for  family members. ? Call: 1-800-NCA-CALL (3345401405). ? Visit the website: https://www.ncadd.org/  The Substance Abuse and Mental Health Services Administration [facility]). This organization will help you find publicly funded treatment centers, help hotlines, and counseling services near you. ? Call: 1-800-662-HELP (209-659-1106). ? Visit the website: www.findtreatment.RockToxic.pl  The National Problem Gambling Helpline. This is a 24-hour confidential helpline for gambling addiction. ? Call: 862-284-8562 ? Visit the website: [employer] In countries outside of the [place] and [place], look in [employer] for contact information for services in your area. Follow these instructions at home:  Find supportive people who will help you stay away from your addiction and stay sober.  Do not use the substance or engage in the activity.  If you  have been through treatment: ? Follow your plan. The plan is usually developed by you and your health care provider during treatment. ? Go to meetings with other people in recovery. ? Avoid people, situations, and things that lead you to do the things you are addicted to (triggers). Summary  Addiction changes the way your brain works. These changes cause a desire to repeat and increase the use of the a substance or behavior.  Addiction is a progressive disease. Without treatment, addiction can get worse. Living with addiction puts you at higher risk for injury, poor health, loss of employment, loss of money, and even death.  There may be options for treatment programs and plans based on your addiction, condition, needs, and preferences. No single treatment is right for everyone.  Your health care provider can help you to find the right treatment. These discussions are confidential. This information is not intended to replace advice given to you by your health care provider. Make sure you discuss any questions you have with your health care provider. Document Revised: 03/08/2019 Document Reviewed: 07/14/2017 Elsevier Patient Education  2020 [employer].

## 2019-07-31 NOTE — TOC Transition Note (Signed)
Transition of Care [facility]) - CM/SW Discharge Note   Patient Details  Name: Sheila Graves MRN: [id number] Date of Birth: 02-Jan-1981  Transition [facility] Care [facility]) CM/SW Contact:  Bess Kinds, RN Phone Number: [phone] 07/31/2019, 5:35 PM   Clinical Narrative:    Patient to transition home today. Spoke with patient at the bedside to discuss substance abuse resources. Advised that information would be provided as part of the discharge paperwork. Discussed outpatient services, like [facility] and [facility]. Patient is seeking inpatient psychiatric facility. Has been to [facility] recently and is currently not eligible for services d/t having had a recent visit. Discussed pending hospital follow up appointment with [facility] 2/11 and advised of the availability of social workers who can assist with referrals.   MD sent discharge medications to [facility] pharmacy. Match created.   Patient stated that she should be able to arrange transport home.   No further TOC needs identified.    Final next level of care: Home/Self Care Barriers to Discharge: No Barriers Identified   Patient Goals and CMS Choice     Choice offered to / list presented to : NA  Discharge Placement                       Discharge Plan and Services                DME Arranged: N/A DME Agency: NA       HH Arranged: NA HH Agency: NA        Social Determinants of Health (SDOH) Interventions     Readmission Risk Interventions No flowsheet data found.

## 2019-07-31 NOTE — Progress Notes (Addendum)
  Subjective:  Patient was sitting up in bed this morning. She states that she was doing much better this morning especially with regards to her muscular contractures.   She expressed that she has really wants to get into an inpatient rehab facility.  Objective:    Vital Signs (last 24 hours): Vitals:   07/30/19 1838 07/30/19 2118 07/31/19 0439 07/31/19 0439  BP: 109/79 103/73 111/80 111/80  Pulse: 75 98 91 75  Resp: 18 16 16 16   Temp: 98.6 F (37 C) 98.2 F (36.8 C) 97.8 F (36.6 C) 97.8 F (36.6 C)  TempSrc:  Oral Oral Oral  SpO2: 98% 95% 99% 100%  Weight: 60.4 kg 58.5 kg    Height: 5\' 5"  (1.651 m)       Physical Exam: General Resting in bed, no acute distress  Pulmonary Breathing comfortably on room air, no cough, no distress   Neurology Alert and answers questions appropriately, no gross deficit    BMP Latest Ref Rng & Units 07/31/2019 07/30/2019 07/30/2019  Glucose 70 - 99 mg/dL 90 91 86  BUN 6 - 20 mg/dL 08/01/2019) 08/01/2019) 5(L)  Creatinine 0.44 - 1.00 mg/dL <1(U <2(V 2.53  Sodium 135 - 145 mmol/L 135 135 133(L)  Potassium 3.5 - 5.1 mmol/L 4.4 2.7(LL) 2.4(LL)  Chloride 98 - 111 mmol/L 103 97(L) 91(L)  CO2 22 - 32 mmol/L 24 31 29   Calcium 8.9 - 10.3 mg/dL 8.0(L) 7.7(L) 7.3(L)   CBC Latest Ref Rng & Units 07/30/2019 07/30/2019 07/30/2019  WBC 4.0 - 10.5 K/uL 3.6(L) - -  Hemoglobin 12.0 - 15.0 g/dL 10.1(L) 10.5(L) 12.6  Hematocrit 36.0 - 46.0 % 29.7(L) 31.0(L) 37.0  Platelets 150 - 400 K/uL 164 - -    Assessment/Plan:   Active Problems:   Hypokalemia  Patient is a 39 year old female with past medical history of alcohol use disorder, pancreatitis who is currently admitted for hypokalemia secondary to nausea, vomiting.  # Hypokaelmia: Patient is in the process of quitting alcohol use, which is led to severe nausea and vomiting.  Potassium: 2.4 (1810, 1/31) -> 40 mEq KDur -> 2.7 (2150, 1/31) -> 40 mEq KDur x3 given -> 4.4. Last magnesium of 1.6 yesterday s/p 4 g IV mag  replacement. Now 2.7. * Patient now tolerating diet  # Alcohol use disorder:  Long-term use of daily alcohol with history of prior seizures and DTs * CIWA with Ativan as needed, max CIWA score 0 overnight, no Ativan administered * Librium 50 mg BID, will decrease to 25 mg BID * TOC consult for alcohol cessation resources * Thiamine and folate supplementation * Social work to assist with inpatient rehab options  Diet: Regular DVT Ppx: Lovenox 40 mg daily Dispo: Anticipated discharge today  2/31, MD 07/31/2019, 5:50 AM Pager: [phone]

## 2019-07-31 NOTE — Progress Notes (Signed)
Sheila Graves to be discharged Home per MD order. Discussed prescriptions and follow up appointments with the patient. Prescriptions explained to patient; medication list explained in detail. Patient verbalized understanding.  Skin clean, dry and intact without evidence of skin break down, no evidence of skin tears noted. IV catheter discontinued intact. Site without signs and symptoms of complications. Dressing and pressure applied. Pt denies pain at the site currently. No complaints noted.  Patient free of lines, drains, and wounds.   An After Visit Summary (AVS) was printed and given to the patient but didn't go over the instructions, as she was in the restroom, told her to call after she comes out, but when went to check her she was not present and all her belongings and AVS not seen. Patient walked out of the unit as per other staff.    Arvilla Meres, RN

## 2019-08-04 ENCOUNTER — Other Ambulatory Visit: Payer: Self-pay

## 2019-08-04 ENCOUNTER — Emergency Department (HOSPITAL_BASED_OUTPATIENT_CLINIC_OR_DEPARTMENT_OTHER): Admission: EM | Admit: 2019-08-04 | Discharge: 2019-08-04 | Discharge status: Absent without leave | Payer: Self-pay

## 2019-08-04 NOTE — ED Notes (Signed)
Patient named Sheila Graves (MRN: [id number] and DOB: 08/20/1982) arrived to the ED and registered under this person's name and DOB. While being triaged, she stated that she made a mistake and registered under her wife's name thinking she was giving her emergency contact person's info. Please do not charge this patient for an ED visit.

## 2019-08-09 ENCOUNTER — Encounter: Payer: Self-pay | Admitting: General Practice

## 2019-08-09 ENCOUNTER — Ambulatory Visit: Payer: Self-pay

## 2019-08-09 NOTE — Progress Notes (Deleted)
Patient ID: Sheila Graves, female   DOB: June 11, 1981, 39 y.o.   MRN: [id number]  After hospitalization 1/31-2/06/2019.    Hospital Course by problem list: # Alcohol use disorder # Hypokalemia # Hypophosphatemia Patient presented on 07/30/19 with intractable nausea and vomiting in the setting of recent heavy alcohol intake and was found to have a potassium of 2.5 along with hypokalemic symptoms.  Patient also had a low magnesium of 1.6 and phosphorus of 1.6.  Electrolytes were repleted and patient was placed on CIWA protocol.  On day of discharge (07/31/2019), patient was stable with resolution of symptoms and tolerating p.o. intake.  Patient was discharged with Librium (2 days) to complete taper and with a 1 month supply of naltrexone.  Patient was evaluated by social work and provided with a handout on alcohol cessation resources.

## 2019-08-10 ENCOUNTER — Inpatient Hospital Stay: Payer: Self-pay

## 2019-09-06 NOTE — ED Provider Notes (Signed)
[facility] Provider Note   CSN: [id number] Arrival date & time: 07/30/19  1007     History Chief Complaint  Patient presents with  . Chest Pain    Sheila Graves is a 39 y.o. female.  HPI 39 year old female history of alcohol abuse, alcohol withdrawal syndrome, hypokalemia, pancreatitis presents today complaining of 2 days of nausea and vomiting.  She states she has not drank alcohol for several days so she has been trying to quit.  She has vomited multiple times.  She is complaining of her hands being drawn up which she associates with her hypokalemia.  She denies any headache, head injury, cough, Covid exposure.  EMS reports that she did have a fever.    Past Medical History:  Diagnosis Date  . Alcohol withdrawal seizure (HCC)   . ETOH abuse   . Hypokalemia   . Pancreatitis   . Thyroid disease     Patient Active Problem List   Diagnosis Date Noted  . Hypophosphatemia 07/31/2019  . Alcohol use disorder, severe, dependence (HCC)   . Prolonged QT interval 03/16/2019  . Hypokalemia 02/04/2019  . 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 FINGER SURGERY    . IRRIGATION AND DEBRIDEMENT SHOULDER Right 11/12/2018   Procedure: Irrigation And Debridement Shoulder;  Surgeon: Roby Lofts, MD;  Location: [facility] OR;  Service: Orthopedics;  Laterality: Right;  . ORIF CLAVICULAR FRACTURE Right 11/12/2018   Procedure: OPEN REDUCTION INTERNAL FIXATION (ORIF) CLAVICULAR FRACTURE;  Surgeon: Roby Lofts, MD;  Location: [facility] OR;  Service: Orthopedics;  Laterality: Right;     OB History   No obstetric history on file.     Family History  Problem Relation Age of Onset  . Alcohol abuse Other     Social History   Tobacco Use  . Smoking status: Current Every Day Smoker    Packs/day: 1.00  . Smokeless tobacco: Never Used  Substance Use Topics  . Alcohol use: Yes     Comment: Last drink: 7pm   . Drug use: Not Currently    Types: Heroin    Home Medications Prior to Admission medications   Medication Sig Start Date End Date Taking? Authorizing Provider  escitalopram (LEXAPRO) 10 MG tablet Take 1 tablet (10 mg total) by mouth daily. 03/22/19   Dhungel, Theda Belfast, MD  meloxicam (MOBIC) 7.5 MG tablet Take 1 tablet (7.5 mg total) by mouth daily. 03/23/19   Cathie Hoops, Amy V, PA-C  naltrexone (DEPADE) 50 MG tablet Take 1 tablet (50 mg total) by mouth daily. 07/31/19   Katherine Roan, MD    Allergies    Patient has no known allergies.  Review of Systems   Review of Systems  All other systems reviewed and are negative.   Physical Exam Updated Vital Signs BP 111/77 (BP Location: Right Arm)   Pulse 81   Temp 97.8 F (36.6 C) (Oral)   Resp 18   Ht 1.651 m (5\' 5" )   Wt 58.5 kg   SpO2 100%   BMI 21.46 kg/m   Physical Exam Vitals reviewed.  Constitutional:      Appearance: She is ill-appearing.  HENT:     Head: Normocephalic and atraumatic.  Eyes:     Pupils: Pupils are equal, round, and reactive to light.  Cardiovascular:     Rate and Rhythm: Regular rhythm. Tachycardia present.     Heart sounds: Normal heart  sounds.  Pulmonary:     Breath sounds: Normal breath sounds.  Abdominal:     Tenderness: There is abdominal tenderness.  Musculoskeletal:        General: Normal range of motion.     Cervical back: Normal range of motion.  Skin:    General: Skin is warm and dry.     Capillary Refill: Capillary refill takes less than 2 seconds.  Neurological:     General: No focal deficit present.     Mental Status: She is alert.     ED Results / Procedures / Treatments   Labs (all labs ordered are listed, but only abnormal results are displayed) Labs Reviewed  CBC - Abnormal; Notable for the following components:      Result Value   RBC 3.54 (*)    Hemoglobin 11.8 (*)    HCT 33.8 (*)    All other components within normal limits  COMPREHENSIVE  METABOLIC PANEL - Abnormal; Notable for the following components:   Sodium 131 (*)    Potassium 2.5 (*)    Chloride 80 (*)    Glucose, Bld 160 (*)    Creatinine, Ser 1.12 (*)    Calcium 8.3 (*)    AST 45 (*)    Total Bilirubin 2.3 (*)    Anion gap 24 (*)    All other components within normal limits  CBC - Abnormal; Notable for the following components:   WBC 2.5 (*)    RBC 3.15 (*)    Hemoglobin 10.5 (*)    HCT 31.1 (*)    All other components within normal limits  MAGNESIUM - Abnormal; Notable for the following components:   Magnesium 2.7 (*)    All other components within normal limits  CK - Abnormal; Notable for the following components:   Total CK 1,197 (*)    All other components within normal limits  BASIC METABOLIC PANEL - Abnormal; Notable for the following components:   Sodium 133 (*)    Potassium 2.4 (*)    Chloride 91 (*)    BUN 5 (*)    Calcium 7.3 (*)    All other components within normal limits  BASIC METABOLIC PANEL - Abnormal; Notable for the following components:   Potassium 2.7 (*)    Chloride 97 (*)    BUN <5 (*)    Calcium 7.7 (*)    All other components within normal limits  BASIC METABOLIC PANEL - Abnormal; Notable for the following components:   BUN <5 (*)    Calcium 8.0 (*)    All other components within normal limits  CBC - Abnormal; Notable for the following components:   WBC 3.6 (*)    RBC 3.04 (*)    Hemoglobin 10.1 (*)    HCT 29.7 (*)    All other components within normal limits  MAGNESIUM - Abnormal; Notable for the following components:   Magnesium 1.6 (*)    All other components within normal limits  BASIC METABOLIC PANEL - Abnormal; Notable for the following components:   Potassium 3.3 (*)    BUN <5 (*)    Calcium 8.6 (*)    All other components within normal limits  GLUCOSE, CAPILLARY - Abnormal; Notable for the following components:   Glucose-Capillary 123 (*)    All other components within normal limits  PHOSPHORUS -  Abnormal; Notable for the following components:   Phosphorus 1.6 (*)    All other components within normal limits  I-STAT CHEM  8, ED - Abnormal; Notable for the following components:   Sodium 129 (*)    Potassium 2.5 (*)    Chloride 79 (*)    Glucose, Bld 162 (*)    Calcium, Ion 0.78 (*)    TCO2 34 (*)    All other components within normal limits  CBG MONITORING, ED - Abnormal; Notable for the following components:   Glucose-Capillary 169 (*)    All other components within normal limits  I-STAT CHEM 8, ED - Abnormal; Notable for the following components:   Sodium 130 (*)    Potassium 2.1 (*)    Chloride 86 (*)    BUN 5 (*)    Calcium, Ion 0.90 (*)    Hemoglobin 10.5 (*)    HCT 31.0 (*)    All other components within normal limits  RESPIRATORY PANEL BY RT PCR (FLU A&B, COVID)  ETHANOL  LIPASE, BLOOD  POC SARS CORONAVIRUS 2 AG -  ED  I-STAT BETA HCG BLOOD, ED ([facility], [facility], [facility] ONLY)    EKG EKG Interpretation  Date/Time:  Sunday July 30 2019 10:21:42 EST Ventricular Rate:  126 PR Interval:    QRS Duration: 90 QT Interval:  343 QTC Calculation: 497 R Axis:   80 Text Interpretation: Sinus tachycardia Non-specific ST-t changes Confirmed by ,  ([id number]) on 07/30/2019 11:12:59 AM Also confirmed by ,  ([id number]), editor Woods, Camille ([id number])  on 07/31/2019 7:08:58 AM   Radiology No results found.  Procedures Procedures (including critical care time)  Medications Ordered in ED Medications  sodium chloride 0.9 % bolus 1,000 mL (0 mLs Intravenous Stopped 07/30/19 1312)  LORazepam (ATIVAN) injection 1 mg (1 mg Intravenous Given 07/30/19 1028)  potassium chloride 10 mEq in 100 mL IVPB (0 mEq Intravenous Stopped 07/30/19 1430)  sodium chloride 0.9 % bolus 1,000 mL (1,000 mLs Intravenous New Bag/Given 07/30/19 1201)  potassium chloride 20 MEQ/15ML (10%) solution 40 mEq (40 mEq Oral Given 07/30/19 1738)  magnesium sulfate IVPB 4 g 100 mL (4 g Intravenous New Bag/Given  07/30/19 2201)  potassium chloride 20 MEQ/15ML (10%) solution 40 mEq (40 mEq Oral Given 07/30/19 2158)  potassium chloride 20 MEQ/15ML (10%) solution 40 mEq (40 mEq Oral Given 07/31/19 0430)  potassium chloride SA (KLOR-CON) CR tablet 40 mEq (40 mEq Oral Given 07/31/19 1404)  chlordiazePOXIDE (LIBRIUM) capsule 25 mg (25 mg Oral Given 07/31/19 1533)    ED Course  I have reviewed the triage vital signs and the nursing notes.  Pertinent labs & imaging results that were available during my care of the patient were reviewed by me and considered in my medical decision making (see chart for details). Patient presents with nausea, vomiting, hypokalemia with associated etoh withdrawal Fluid and electrolyte repletion ensuing Ativan and ciwa protocol for etoh withdrawal. Plan admission for further evaluation, monitoring, and treatment   MDM Rules/Calculators/A&P                     Final Clinical Impression(s) / ED Diagnoses Final diagnoses:  Hypokalemia  Abnormal EKG  Alcohol withdrawal syndrome with complication (HCC)    Rx / DC Orders ED Discharge Orders         Ordered    chlordiazePOXIDE (LIBRIUM) 25 MG capsule     07/31/19 1516    naltrexone (DEPADE) 50 MG tablet  Daily     07/31/19 1457    potassium & sodium phosphates (PHOS-NAK) 280-160-250 MG PACK  Every 4 hours     02 /01/21 1516  Increase activity slowly     07/31/19 1523    Diet - low sodium heart healthy     07/31/19 1523    Call MD for:  persistant nausea and vomiting     07/31/19 1523    Call MD for:  severe uncontrolled pain     07/31/19 1523    Call MD for:  persistant dizziness or light-headedness     07/31/19 1523    Discharge instructions    Comments: You were seen in clinic for low potassium and alcohol withdrawal symptoms. We have provided you with medications to help with the alcohol withdrawal. We want you to take the librium 25 mg twice a day tomorrow (Tuesday) and then take librium 25 mg once on Wednesday. You can  then start taking naltrexone which should help with your cravings for alcohol. Your phosphorous was also low and we have provided you with phosphorus supplements. Please take these as prescribed. We have also set you up with a followup appointment in our internal medicine clinic where we can check your electrolytes and help you get the care that you need. Thank you for allowing [place] to be part of your medical care!   07/31/19 1523           Margarita Grizzle, MD 09/06/19 1242

## 2019-09-15 ENCOUNTER — Other Ambulatory Visit: Payer: Self-pay

## 2019-09-15 ENCOUNTER — Emergency Department (HOSPITAL_COMMUNITY)
Admission: EM | Admit: 2019-09-15 | Discharge: 2019-09-16 | Disposition: A | Discharge status: Home / Self Care | Payer: Self-pay | Attending: Emergency Medicine | Admitting: Emergency Medicine

## 2019-09-15 ENCOUNTER — Encounter (HOSPITAL_COMMUNITY): Payer: Self-pay

## 2019-09-15 ENCOUNTER — Emergency Department (HOSPITAL_BASED_OUTPATIENT_CLINIC_OR_DEPARTMENT_OTHER)
Admission: EM | Admit: 2019-09-15 | Discharge: 2019-09-15 | Disposition: A | Discharge status: Home / Self Care | Payer: Self-pay | Attending: Emergency Medicine | Admitting: Emergency Medicine

## 2019-09-15 ENCOUNTER — Encounter (HOSPITAL_BASED_OUTPATIENT_CLINIC_OR_DEPARTMENT_OTHER): Payer: Self-pay | Admitting: *Deleted

## 2019-09-15 DIAGNOSIS — Z79899 Other long term (current) drug therapy: Secondary | ICD-10-CM | POA: Insufficient documentation

## 2019-09-15 DIAGNOSIS — F17210 Nicotine dependence, cigarettes, uncomplicated: Secondary | ICD-10-CM | POA: Insufficient documentation

## 2019-09-15 DIAGNOSIS — F10220 Alcohol dependence with intoxication, uncomplicated: Secondary | ICD-10-CM | POA: Insufficient documentation

## 2019-09-15 DIAGNOSIS — F10229 Alcohol dependence with intoxication, unspecified: Secondary | ICD-10-CM | POA: Insufficient documentation

## 2019-09-15 DIAGNOSIS — Y908 Blood alcohol level of 240 mg/100 ml or more: Secondary | ICD-10-CM | POA: Insufficient documentation

## 2019-09-15 DIAGNOSIS — F10920 Alcohol use, unspecified with intoxication, uncomplicated: Secondary | ICD-10-CM

## 2019-09-15 DIAGNOSIS — F1020 Alcohol dependence, uncomplicated: Secondary | ICD-10-CM

## 2019-09-15 DIAGNOSIS — E876 Hypokalemia: Secondary | ICD-10-CM | POA: Insufficient documentation

## 2019-09-15 LAB — COMPREHENSIVE METABOLIC PANEL
ALT: 19 U/L (ref 0–44)
AST: 40 U/L (ref 15–41)
Albumin: 4.2 g/dL (ref 3.5–5.0)
Alkaline Phosphatase: 67 U/L (ref 38–126)
Anion gap: 12 (ref 5–15)
BUN: 13 mg/dL (ref 6–20)
CO2: 29 mmol/L (ref 22–32)
Calcium: 8.7 mg/dL — ABNORMAL LOW (ref 8.9–10.3)
Chloride: 94 mmol/L — ABNORMAL LOW (ref 98–111)
Creatinine, Ser: 0.57 mg/dL (ref 0.44–1.00)
GFR calc Af Amer: >60 mL/min (ref >60)
GFR calc non Af Amer: >60 mL/min (ref >60)
Glucose, Bld: 115 mg/dL — ABNORMAL HIGH (ref 70–99)
Potassium: 2.7 mmol/L — CL (ref 3.5–5.1)
Sodium: 135 mmol/L (ref 135–145)
Total Bilirubin: 0.6 mg/dL (ref 0.3–1.2)
Total Protein: 7 g/dL (ref 6.5–8.1)

## 2019-09-15 LAB — CBC WITH DIFFERENTIAL/PLATELET
Abs Immature Granulocytes: 0.02 10*3/uL (ref 0.00–0.07)
Basophils Absolute: 0 10*3/uL (ref 0.0–0.1)
Basophils Relative: 0 %
Eosinophils Absolute: 0 10*3/uL (ref 0.0–0.5)
Eosinophils Relative: 0 %
HCT: 38.9 % (ref 36.0–46.0)
Hemoglobin: 13.4 g/dL (ref 12.0–15.0)
Immature Granulocytes: 0 %
Lymphocytes Relative: 24 %
Lymphs Abs: 2.1 10*3/uL (ref 0.7–4.0)
MCH: 32.5 pg (ref 26.0–34.0)
MCHC: 34.4 g/dL (ref 30.0–36.0)
MCV: 94.4 fL (ref 80.0–100.0)
Monocytes Absolute: 0.8 10*3/uL (ref 0.1–1.0)
Monocytes Relative: 9 %
Neutro Abs: 6 10*3/uL (ref 1.7–7.7)
Neutrophils Relative %: 67 %
Platelets: 283 10*3/uL (ref 150–400)
RBC: 4.12 MIL/uL (ref 3.87–5.11)
RDW: 13.1 % (ref 11.5–15.5)
WBC: 8.9 10*3/uL (ref 4.0–10.5)
nRBC: 0 % (ref 0.0–0.2)

## 2019-09-15 LAB — URINALYSIS, ROUTINE W REFLEX MICROSCOPIC
Bilirubin Urine: NEGATIVE
Glucose, UA: NEGATIVE mg/dL
Hgb urine dipstick: NEGATIVE
Ketones, ur: NEGATIVE mg/dL
Leukocytes,Ua: NEGATIVE
Nitrite: NEGATIVE
Protein, ur: NEGATIVE mg/dL
Specific Gravity, Urine: 1.01 (ref 1.005–1.030)
pH: 8 (ref 5.0–8.0)

## 2019-09-15 LAB — LIPASE, BLOOD: Lipase: 23 U/L (ref 11–51)

## 2019-09-15 LAB — RAPID URINE DRUG SCREEN, HOSP PERFORMED
Amphetamines: NOT DETECTED
Barbiturates: NOT DETECTED
Benzodiazepines: NOT DETECTED
Cocaine: NOT DETECTED
Opiates: NOT DETECTED
Tetrahydrocannabinol: NOT DETECTED

## 2019-09-15 LAB — MAGNESIUM: Magnesium: 1.9 mg/dL (ref 1.7–2.4)

## 2019-09-15 LAB — ETHANOL: Alcohol, Ethyl (B): 340 mg/dL (ref <10)

## 2019-09-15 MED ORDER — LORAZEPAM 2 MG/ML IJ SOLN: 0.0000 mg | Freq: Four times a day (QID) | INTRAMUSCULAR | Status: DC

## 2019-09-15 MED ORDER — LACTATED RINGERS IV BOLUS
500.0000 mL | Freq: Once | INTRAVENOUS | Status: AC
  Administered 2019-09-16: 500 mL via INTRAVENOUS

## 2019-09-15 MED ORDER — LORAZEPAM 1 MG PO TABS: 0.0000 mg | ORAL_TABLET | Freq: Two times a day (BID) | ORAL | Status: DC

## 2019-09-15 MED ORDER — LORAZEPAM 2 MG/ML IJ SOLN: 0.0000 mg | Freq: Two times a day (BID) | INTRAMUSCULAR | Status: DC

## 2019-09-15 MED ORDER — LORAZEPAM 1 MG PO TABS: 0.0000 mg | ORAL_TABLET | Freq: Four times a day (QID) | ORAL | Status: DC

## 2019-09-15 MED ORDER — POTASSIUM CHLORIDE ER 10 MEQ PO TBCR: 10.0000 meq | EXTENDED_RELEASE_TABLET | Freq: Every day | ORAL | 0 refills | Status: DC

## 2019-09-15 MED ORDER — THIAMINE HCL 100 MG PO TABS: 100.0000 mg | ORAL_TABLET | Freq: Every day | ORAL | Status: DC

## 2019-09-15 MED ORDER — POTASSIUM CHLORIDE 10 MEQ/100ML IV SOLN
10.0000 meq | Freq: Once | INTRAVENOUS | Status: AC
  Administered 2019-09-15: 10 meq via INTRAVENOUS
  Filled 2019-09-15: qty 100

## 2019-09-15 MED ORDER — SODIUM CHLORIDE 0.9 % IV BOLUS
1000.0000 mL | Freq: Once | INTRAVENOUS | Status: AC
  Administered 2019-09-16: 1000 mL via INTRAVENOUS

## 2019-09-15 MED ORDER — POTASSIUM CHLORIDE CRYS ER 20 MEQ PO TBCR
40.0000 meq | EXTENDED_RELEASE_TABLET | Freq: Once | ORAL | Status: AC
  Administered 2019-09-15: 40 meq via ORAL
  Filled 2019-09-15: qty 2

## 2019-09-15 MED ORDER — THIAMINE HCL 100 MG/ML IJ SOLN
100.0000 mg | Freq: Every day | INTRAMUSCULAR | Status: DC
  Filled 2019-09-15: qty 2

## 2019-09-15 MED ORDER — SODIUM CHLORIDE 0.9 % IV BOLUS
1000.0000 mL | Freq: Once | INTRAVENOUS | Status: AC
  Administered 2019-09-15: 1000 mL via INTRAVENOUS

## 2019-09-15 NOTE — Discharge Instructions (Addendum)
Please read the attachments regarding alcohol use disorder.  Please fill this prescription for supplemental potassium.  You will need to have your labs rechecked in 4 to 5 days to ensure correction of your electrolyte derangement.  Return to the ED or seek immediate medical attention for any new or worsening symptoms.

## 2019-09-15 NOTE — ED Triage Notes (Signed)
Family called EMS for intoxication and concerns about pt potassium.

## 2019-09-15 NOTE — ED Provider Notes (Signed)
[facility]-EMERGENCY DEPT Provider Note   CSN: [id number] Arrival date & time: 09/15/19  2239     History Chief Complaint  Patient presents with  . Alcohol Intoxication   LEVEL FIVE CAVEAT DUE TO AMS  Sheila Graves is a 39 y.o. female.  HPI HPI Comments: Sheila Graves is a 39 y.o. female who presents to the Emergency Department via EMS due to hypokalemia. She was seen in the ED earlier today and discharged four hours ago after being treated for hypokalemia at 2.7 and significant ETOH ingestion with an ethanol of 340. She did not want to be admitted at this time and was discharged. Her family found out about this and had her sent back to the ED for further evaluation.   Pt states that "she hurts all over" but otherwise is dysarthric and does not provide clears answers to questioning.      Past Medical History:  Diagnosis Date  . Alcohol withdrawal seizure (HCC)   . ETOH abuse   . Hypokalemia   . Pancreatitis   . Thyroid disease     Patient Active Problem List   Diagnosis Date Noted  . Hypophosphatemia 07/31/2019  . Alcohol use disorder, severe, dependence (HCC)   . Prolonged QT interval 03/16/2019  . Hypokalemia 02/04/2019  . 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 FINGER SURGERY    . IRRIGATION AND DEBRIDEMENT SHOULDER Right 11/12/2018   Procedure: Irrigation And Debridement Shoulder;  Surgeon: Roby Lofts, MD;  Location: [facility] OR;  Service: Orthopedics;  Laterality: Right;  . ORIF CLAVICULAR FRACTURE Right 11/12/2018   Procedure: OPEN REDUCTION INTERNAL FIXATION (ORIF) CLAVICULAR FRACTURE;  Surgeon: Roby Lofts, MD;  Location: [facility] OR;  Service: Orthopedics;  Laterality: Right;     OB History   No obstetric history on file.     Family History  Problem Relation Age of Onset  . Alcohol abuse Other     Social History   Tobacco Use  .  Smoking status: Current Every Day Smoker    Packs/day: 1.00  . Smokeless tobacco: Never Used  Substance Use Topics  . Alcohol use: Yes  . Drug use: Not Currently    Types: Heroin    Home Medications Prior to Admission medications   Medication Sig Start Date End Date Taking? Authorizing Provider  escitalopram (LEXAPRO) 10 MG tablet Take 1 tablet (10 mg total) by mouth daily. 03/22/19   Dhungel, Theda Belfast, MD  meloxicam (MOBIC) 7.5 MG tablet Take 1 tablet (7.5 mg total) by mouth daily. 03/23/19   Cathie Hoops, Amy V, PA-C  naltrexone (DEPADE) 50 MG tablet Take 1 tablet (50 mg total) by mouth daily. 07/31/19   Katherine Roan, MD  potassium chloride (KLOR-CON) 10 MEQ tablet Take 1 tablet (10 mEq total) by mouth daily. 09/15/19   Lorelee New, PA-C    Allergies    Patient has no known allergies.  Review of Systems   Review of Systems  Unable to perform ROS: Mental status change   Physical Exam Updated Vital Signs BP 99/69 (BP Location: Right Arm)   Pulse 84   Temp 97.8 F (36.6 C) (Oral)   Resp 18   Ht 5\' 7"  (1.702 m)   SpO2 100%   BMI 20.36 kg/m   Physical Exam Vitals and nursing note reviewed.  Constitutional:      General: She is not in acute distress.  Appearance: She is not ill-appearing, toxic-appearing or diaphoretic.     Comments: Pt is a well developed caucasian female. She is lying supine. She does not speak clearly or coherently. She cannot focus her gaze when communicating and cannot follow basic commands.   HENT:     Head: Normocephalic.     Nose: Nose normal.     Mouth/Throat:     Mouth: Mucous membranes are moist.     Pharynx: Oropharynx is clear. No oropharyngeal exudate or posterior oropharyngeal erythema.  Eyes:     General: No scleral icterus.       Right eye: No discharge.        Left eye: No discharge.     Conjunctiva/sclera: Conjunctivae normal.     Pupils: Pupils are equal, round, and reactive to light.  Cardiovascular:     Rate and Rhythm: Normal rate  and regular rhythm.     Pulses: Normal pulses.     Heart sounds: No murmur. No friction rub. No gallop.      Comments: Diffuse anterior chest wall tenderness with very light touch. No external signs of injury. No erythema, edema or ecchymosis appreciated.  Pulmonary:     Effort: Pulmonary effort is normal. No respiratory distress.     Breath sounds: Normal breath sounds. No stridor. No wheezing, rhonchi or rales.  Abdominal:     General: Abdomen is flat. Bowel sounds are normal.     Palpations: Abdomen is soft.     Tenderness: There is abdominal tenderness (diffuse).  Skin:    General: Skin is warm and dry.  Neurological:     Mental Status: She is disoriented.     Cranial Nerves: Dysarthria present.  Psychiatric:        Attention and Perception: She is inattentive.        Speech: She is noncommunicative. Speech is slurred.        Behavior: Behavior is slowed.    ED Results / Procedures / Treatments   Labs (all labs ordered are listed, but only abnormal results are displayed) Labs Reviewed  BASIC METABOLIC PANEL  ETHANOL    EKG None  Radiology No results found.  Procedures Procedures   Medications Ordered in ED Medications  lactated ringers bolus 500 mL (has no administration in time range)  LORazepam (ATIVAN) injection 0-4 mg (has no administration in time range)    Or  LORazepam (ATIVAN) tablet 0-4 mg (has no administration in time range)  LORazepam (ATIVAN) injection 0-4 mg (has no administration in time range)    Or  LORazepam (ATIVAN) tablet 0-4 mg (has no administration in time range)  thiamine tablet 100 mg (has no administration in time range)    Or  thiamine (B-1) injection 100 mg (has no administration in time range)  sodium chloride 0.9 % bolus 1,000 mL (has no administration in time range)    ED Course  I have reviewed the triage vital signs and the nursing notes.  Pertinent labs & imaging results that were available during my care of the patient  were reviewed by me and considered in my medical decision making (see chart for details).    MDM Rules/Calculators/A&P                      11:15 PM pt is a 39 year old with a significant medical history including ETOH abuse and withdrawal seizures. She has had multiple visits to the ED recently for alcoholic intoxication and was admitted  in January of this year for hypokalemia and MDD. She was seen in the ED earlier today and treated for significant ETOH intoxication and hypokalemia. She did not wish to be admitted at this time. She is still visibly intoxicated and cannot clearly answer questions or follow basic commands. Will recheck her potassium and ethanol levels. Will give LR bolus. Her vital signs have improved since her visit this morning and she is no longer tachycardic. Will monitor.  11:46 PM Pt discussed with Otelia Santee, PA-C who will be assuming her care moving forward.   Final Clinical Impression(s) / ED Diagnoses Final diagnoses:  None    Rx / DC Orders ED Discharge Orders    None       Rayna Sexton, PA-C 09/15/19 2355    Isla Pence, MD 09/16/19 [phone]

## 2019-09-15 NOTE — ED Notes (Addendum)
Pt. Reports she is an alcoholic and she has Potassium issues.  Pt. Reports last K+ issue was a month ago.   Pt. Last alcoholic drink was earlier today the drink and was vodka. Pt. Very tearful and reports her disease is alcoholism.  Pt. Crying and asking me not to be judjemntal RN reports I am here to help her and not judge her.  RN trying to help.

## 2019-09-15 NOTE — ED Notes (Signed)
PT on monitor 

## 2019-09-15 NOTE — ED Triage Notes (Signed)
Chest pain. Spasms of her hands. States she is an alcoholic and when her potassium drops her hands cramp.

## 2019-09-15 NOTE — ED Notes (Addendum)
Pt. Is resting in stretcher with call bell at bedside, using cell phone off and on.

## 2019-09-15 NOTE — ED Provider Notes (Signed)
[facility] EMERGENCY DEPARTMENT Provider Note   CSN: [id number] Arrival date & time: 09/15/19  1342     History Chief Complaint  Patient presents with  . Chest Pain    Sheila Graves is a 39 y.o. female with PMH significant for alcohol use disorder, pancreatitis, hypokalemia, and alcohol withdrawal seizures who presents to the ED with complaints of chest pain and hand spasms.  Patient has a significant history of alcohol abuse and is inebriated on examination.  She reports that her last alcohol consumption was this morning and vodka is her drink of choice.  Her chest pain is left-sided, sharp, constant, nonradiating, reproducible with palpation.  She also is endorsing abdominal pain but is a poor historian given suspected intoxication.  She denies any headache or dizziness, back pain, nausea or vomiting, leg pain, blurred vision, cough, fevers or chills, or other symptoms.  She states that she has not been eating because she cannot afford food.  HPI     Past Medical History:  Diagnosis Date  . Alcohol withdrawal seizure (HCC)   . ETOH abuse   . Hypokalemia   . Pancreatitis   . Thyroid disease     Patient Active Problem List   Diagnosis Date Noted  . Hypophosphatemia 07/31/2019  . Alcohol use disorder, severe, dependence (HCC)   . Prolonged QT interval 03/16/2019  . Hypokalemia 02/04/2019  . 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 FINGER SURGERY    . IRRIGATION AND DEBRIDEMENT SHOULDER Right 11/12/2018   Procedure: Irrigation And Debridement Shoulder;  Surgeon: Roby Lofts, MD;  Location: [facility] OR;  Service: Orthopedics;  Laterality: Right;  . ORIF CLAVICULAR FRACTURE Right 11/12/2018   Procedure: OPEN REDUCTION INTERNAL FIXATION (ORIF) CLAVICULAR FRACTURE;  Surgeon: Roby Lofts, MD;  Location: [facility] OR;  Service: Orthopedics;  Laterality: Right;     OB  History   No obstetric history on file.     Family History  Problem Relation Age of Onset  . Alcohol abuse Other     Social History   Tobacco Use  . Smoking status: Current Every Day Smoker    Packs/day: 1.00  . Smokeless tobacco: Never Used  Substance Use Topics  . Alcohol use: Yes  . Drug use: Not Currently    Types: Heroin    Home Medications Prior to Admission medications   Medication Sig Start Date End Date Taking? Authorizing Provider  escitalopram (LEXAPRO) 10 MG tablet Take 1 tablet (10 mg total) by mouth daily. 03/22/19   Dhungel, Theda Belfast, MD  meloxicam (MOBIC) 7.5 MG tablet Take 1 tablet (7.5 mg total) by mouth daily. 03/23/19   Cathie Hoops, Amy V, PA-C  naltrexone (DEPADE) 50 MG tablet Take 1 tablet (50 mg total) by mouth daily. 07/31/19   Katherine Roan, MD  potassium chloride (KLOR-CON) 10 MEQ tablet Take 1 tablet (10 mEq total) by mouth daily. 09/15/19   Lorelee New, PA-C    Allergies    Patient has no known allergies.  Review of Systems   Review of Systems  Unable to perform ROS: Mental status change    Physical Exam Updated Vital Signs BP 109/89 (BP Location: Right Arm)   Pulse 81   Temp 98 F (36.7 C) (Oral)   Resp 14   Ht 5\' 11"  (1.803 m)   Wt 59 kg   SpO2 100%   BMI 18.13 kg/m   Physical Exam  Vitals and nursing note reviewed. Exam conducted with a chaperone present.  Constitutional:      Appearance: Normal appearance.  HENT:     Head: Normocephalic and atraumatic.  Eyes:     General: No scleral icterus.    Conjunctiva/sclera: Conjunctivae normal.  Cardiovascular:     Rate and Rhythm: Normal rate and regular rhythm.     Pulses: Normal pulses.     Heart sounds: Normal heart sounds.  Pulmonary:     Effort: Pulmonary effort is normal. No respiratory distress.     Breath sounds: Normal breath sounds.  Abdominal:     Comments: Soft, nondistended.  TTP diffusely, most notably in epigastrium.  No rigidity.  No overlying skin changes.  No  guarding.  NABS.  Musculoskeletal:     Cervical back: Normal range of motion and neck supple. No rigidity.     Comments: Hands contracted and spasm bilaterally.  Negative Chvostek's sign.  Skin:    General: Skin is dry.     Capillary Refill: Capillary refill takes less than 2 seconds.  Neurological:     Mental Status: She is alert.     GCS: GCS eye subscore is 4. GCS verbal subscore is 5. GCS motor subscore is 6.  Psychiatric:        Mood and Affect: Mood normal.        Behavior: Behavior normal.        Thought Content: Thought content normal.     Comments: Intoxicated.      ED Results / Procedures / Treatments   Labs (all labs ordered are listed, but only abnormal results are displayed) Labs Reviewed  COMPREHENSIVE METABOLIC PANEL - Abnormal; Notable for the following components:      Result Value   Potassium 2.7 (*)    Chloride 94 (*)    Glucose, Bld 115 (*)    Calcium 8.7 (*)    All other components within normal limits  ETHANOL - Abnormal; Notable for the following components:   Alcohol, Ethyl (B) 340 (*)    All other components within normal limits  CBC WITH DIFFERENTIAL/PLATELET  LIPASE, BLOOD  URINALYSIS, ROUTINE W REFLEX MICROSCOPIC  RAPID URINE DRUG SCREEN, HOSP PERFORMED  MAGNESIUM    EKG EKG Interpretation  Date/Time:  Friday September 15 2019 13:49:18 EDT Ventricular Rate:  107 PR Interval:  120 QRS Duration: 84 QT Interval:  360 QTC Calculation: 480 R Axis:   83 Text Interpretation: Sinus tachycardia Otherwise normal ECG No significant change since last tracing Confirmed by Richardean Canal [phone]) on 09/15/2019 2:39:55 PM   Radiology No results found.  Procedures Procedures (including critical care time)  Medications Ordered in ED Medications  sodium chloride 0.9 % bolus 1,000 mL (0 mLs Intravenous Stopped 09/15/19 1616)  potassium chloride SA (KLOR-CON) CR tablet 40 mEq (40 mEq Oral Given 09/15/19 1636)  potassium chloride 10 mEq in 100 mL IVPB (10  mEq Intravenous New Bag/Given 09/15/19 1634)  sodium chloride 0.9 % bolus 1,000 mL (1,000 mLs Intravenous New Bag/Given 09/15/19 1633)    ED Course  I have reviewed the triage vital signs and the nursing notes.  Pertinent labs & imaging results that were available during my care of the patient were reviewed by me and considered in my medical decision making (see chart for details).    MDM Rules/Calculators/A&P                      Patient's alcohol elevated at 340,  consistent with her suspected intoxication.  Her UDS was negative and her lab work was significant only for hypokalemia to 2.7.  Replenished with 10 mEq IV and 40 mEq p.o. K-Dur.  She is not wanting to be admitted for her electrolyte derangement.  There were no significant EKG changes.  Patient was given 1 L NS bolus and is now receiving NS infusion of 250 mL per hour with her IV potassium.    Counseled on alcohol cessation as she has been evaluated in the ER 15 times in the past 6 months for alcohol-related complaints.  Will provide an attachment of resources for substance use disorder.  Counseled her regarding alcohol and she plans to follow-up with [facility].  We will discharge her home with p.o. potassium and encourage her to follow-up outpatient for laboratory recheck to ensure correction of her electrolyte derangement.  She said that she can go to her primary care provider.  Strict ED return precautions discussed.  All of the evaluation and work-up results were discussed with the patient and any family at bedside. They were provided opportunity to ask any additional questions and have none at this time. They have expressed understanding of verbal discharge instructions as well as return precautions and are agreeable to the plan.    Final Clinical Impression(s) / ED Diagnoses Final diagnoses:  Hypokalemia  Alcohol use disorder, severe, dependence ([place])    Rx / DC Orders ED Discharge Orders         Ordered    potassium  chloride (KLOR-CON) 10 MEQ tablet  Daily     09/15/19 1710           Reita Chard 09/15/19 1811    Drenda Freeze, MD 09/16/19 [phone]

## 2019-09-16 ENCOUNTER — Encounter (HOSPITAL_COMMUNITY): Payer: Self-pay

## 2019-09-16 ENCOUNTER — Other Ambulatory Visit: Payer: Self-pay

## 2019-09-16 ENCOUNTER — Emergency Department (HOSPITAL_COMMUNITY)
Admission: EM | Admit: 2019-09-16 | Discharge: 2019-09-17 | Disposition: A | Discharge status: Home / Self Care | Payer: Self-pay | Attending: Emergency Medicine | Admitting: Emergency Medicine

## 2019-09-16 DIAGNOSIS — Y908 Blood alcohol level of 240 mg/100 ml or more: Secondary | ICD-10-CM | POA: Insufficient documentation

## 2019-09-16 DIAGNOSIS — F17210 Nicotine dependence, cigarettes, uncomplicated: Secondary | ICD-10-CM | POA: Insufficient documentation

## 2019-09-16 DIAGNOSIS — R45851 Suicidal ideations: Secondary | ICD-10-CM | POA: Insufficient documentation

## 2019-09-16 DIAGNOSIS — F1010 Alcohol abuse, uncomplicated: Secondary | ICD-10-CM

## 2019-09-16 DIAGNOSIS — F10129 Alcohol abuse with intoxication, unspecified: Secondary | ICD-10-CM | POA: Insufficient documentation

## 2019-09-16 LAB — BASIC METABOLIC PANEL
Anion gap: 7 (ref 5–15)
BUN: 11 mg/dL (ref 6–20)
CO2: 28 mmol/L (ref 22–32)
Calcium: 8 mg/dL — ABNORMAL LOW (ref 8.9–10.3)
Chloride: 110 mmol/L (ref 98–111)
Creatinine, Ser: 0.57 mg/dL (ref 0.44–1.00)
GFR calc Af Amer: >60 mL/min (ref >60)
GFR calc non Af Amer: >60 mL/min (ref >60)
Glucose, Bld: 91 mg/dL (ref 70–99)
Potassium: 4.1 mmol/L (ref 3.5–5.1)
Sodium: 145 mmol/L (ref 135–145)

## 2019-09-16 LAB — ETHANOL: Alcohol, Ethyl (B): 375 mg/dL (ref <10)

## 2019-09-16 MED ORDER — SODIUM CHLORIDE 0.9% FLUSH: 10.0000 mL | INTRAVENOUS | Status: DC | PRN

## 2019-09-16 MED ORDER — SODIUM CHLORIDE 0.9% FLUSH: 10.0000 mL | Freq: Two times a day (BID) | INTRAVENOUS | Status: DC

## 2019-09-16 NOTE — ED Triage Notes (Signed)
Patient in by EMS Due to a suicide attempt by consuming an unknown amount of Nyquil (6 empty dual pill packs and an empty bottle unknown amount) also consumed vodka, patient arrives drowsy arousable to voice, vitals stable

## 2019-09-16 NOTE — ED Notes (Signed)
Pt able to ambulate to restroom with no assistance.  

## 2019-09-16 NOTE — Discharge Instructions (Addendum)
You have had 2 emergency department evaluations in the past 24 hours. You have been observed in the ED for over 4 hours without adverse event. Your potassium is normal at this time. You are felt appropriate for discharge home and should follow up in the outpatient setting as needed.

## 2019-09-16 NOTE — ED Provider Notes (Signed)
Patient care signed out at end of shift by Placido Sou, PA-C. She was seen in the ED earlier today by previous treatment team. She was diagnosed with hypokalemia and alcohol intoxication and admission was offered but patient refused. She was felt able to make her own decisions at the time of discharge. She returns this visit after family was concerned about her condition.   The patient's ETOH earlier was 340, K+ 2.7. She was given potassium prior to earlier discharge. She has a longstanding history of alcoholism and is well known to this health system.   Plan: recheck potassium and alcohol levels. Monitor mental status and vital signs over observation period.   On my initial assessment, the patient is awake, GCS 13 as her speech is slurred and incoherent. CIWA started. IV team had to be consulted because she is a difficult access. IVF's ordered, Thiamine.   Potassium on recheck is normal at 4.1 Alcohol level is pending. She is voicing desire to leave. Will wait for alcohol level, ambulate to insure she is steady.   Alcohol level is 375. potassium normal . The patient is oriented. VSS. She ambulates without imbalance. Even with significantly elevated alcohol, she is felt coherent as elevated ETOH is chronic for this patient. No sign of withdrawal. She is planning to [facility] home safely.    Elpidio Anis, PA-C 09/16/19 0355    Dione Booze, MD 09/16/19 [phone]

## 2019-09-16 NOTE — ED Notes (Signed)
IV stick unsuccessful x2  

## 2019-09-16 NOTE — ED Notes (Signed)
Poison Control suggestions  EKG And Tylenol lvls @ 4 hours post arrival Benzos for Seizure or agitation   s/s of Nyquil overdose Drowsy, Liver damage, Prolonged qrs,Tachycardia

## 2019-09-17 ENCOUNTER — Other Ambulatory Visit: Payer: Self-pay

## 2019-09-17 LAB — CBC WITH DIFFERENTIAL/PLATELET
Abs Immature Granulocytes: 0.03 10*3/uL (ref 0.00–0.07)
Basophils Absolute: 0 10*3/uL (ref 0.0–0.1)
Basophils Relative: 1 %
Eosinophils Absolute: 0 10*3/uL (ref 0.0–0.5)
Eosinophils Relative: 0 %
HCT: 41 % (ref 36.0–46.0)
Hemoglobin: 13.4 g/dL (ref 12.0–15.0)
Immature Granulocytes: 1 %
Lymphocytes Relative: 31 %
Lymphs Abs: 2 10*3/uL (ref 0.7–4.0)
MCH: 32.6 pg (ref 26.0–34.0)
MCHC: 32.7 g/dL (ref 30.0–36.0)
MCV: 99.8 fL (ref 80.0–100.0)
Monocytes Absolute: 0.2 10*3/uL (ref 0.1–1.0)
Monocytes Relative: 3 %
Neutro Abs: 4.3 10*3/uL (ref 1.7–7.7)
Neutrophils Relative %: 64 %
Platelets: 262 10*3/uL (ref 150–400)
RBC: 4.11 MIL/uL (ref 3.87–5.11)
RDW: 13.4 % (ref 11.5–15.5)
WBC: 6.5 10*3/uL (ref 4.0–10.5)
nRBC: 0 % (ref 0.0–0.2)

## 2019-09-17 LAB — RAPID URINE DRUG SCREEN, HOSP PERFORMED
Amphetamines: NOT DETECTED
Barbiturates: NOT DETECTED
Benzodiazepines: NOT DETECTED
Cocaine: NOT DETECTED
Opiates: NOT DETECTED
Tetrahydrocannabinol: NOT DETECTED

## 2019-09-17 LAB — SALICYLATE LEVEL: Salicylate Lvl: <7 mg/dL — ABNORMAL LOW (ref 7.0–30.0)

## 2019-09-17 LAB — COMPREHENSIVE METABOLIC PANEL
ALT: 20 U/L (ref 0–44)
AST: 40 U/L (ref 15–41)
Albumin: 3.8 g/dL (ref 3.5–5.0)
Alkaline Phosphatase: 66 U/L (ref 38–126)
Anion gap: 12 (ref 5–15)
BUN: 15 mg/dL (ref 6–20)
CO2: 23 mmol/L (ref 22–32)
Calcium: 7.9 mg/dL — ABNORMAL LOW (ref 8.9–10.3)
Chloride: 115 mmol/L — ABNORMAL HIGH (ref 98–111)
Creatinine, Ser: 0.67 mg/dL (ref 0.44–1.00)
GFR calc Af Amer: >60 mL/min (ref >60)
GFR calc non Af Amer: >60 mL/min (ref >60)
Glucose, Bld: 82 mg/dL (ref 70–99)
Potassium: 4.3 mmol/L (ref 3.5–5.1)
Sodium: 150 mmol/L — ABNORMAL HIGH (ref 135–145)
Total Bilirubin: 0.4 mg/dL (ref 0.3–1.2)
Total Protein: 6.3 g/dL — ABNORMAL LOW (ref 6.5–8.1)

## 2019-09-17 LAB — ACETAMINOPHEN LEVEL
Acetaminophen (Tylenol), Serum: <10 ug/mL — ABNORMAL LOW (ref 10–30)
Acetaminophen (Tylenol), Serum: <10 ug/mL — ABNORMAL LOW (ref 10–30)

## 2019-09-17 LAB — ETHANOL: Alcohol, Ethyl (B): 448 mg/dL (ref <10)

## 2019-09-17 NOTE — ED Notes (Signed)
Patient calling for ride.

## 2019-09-17 NOTE — ED Provider Notes (Signed)
[facility]-EMERGENCY DEPT Provider Note   CSN: [id number] Arrival date & time: 09/16/19  2307     History Chief Complaint  Patient presents with  . Suicidal  . Drug Overdose    Nyquil unknown amount    Sheila Graves is a 39 y.o. female.  HPI     This is a 39 year old female with a history of alcohol abuse, hypokalemia who presents with intoxication.  Per EMS, there was some concern for NyQuil overdose.  They reportedly saw several empty containers and pill bottles of NyQuil.  However, patient denies NyQuil ingestion to me.  She does report significant alcohol use.  Reports that she primarily drinks liquor.  States that she was trying to hurt herself but will not elaborate further.  When asked if she has any pain.  She states "I hurt all over."  She is generally emotionally labile and is difficult to respond to pointed questions.  She denies any other drug use.  Patient was seen and evaluated just yesterday.  Noted to have hypokalemia and alcohol intoxication.  She was observed in the emergency room and discharged home.  Past Medical History:  Diagnosis Date  . Alcohol withdrawal seizure (HCC)   . ETOH abuse   . Hypokalemia   . Pancreatitis   . Thyroid disease     Patient Active Problem List   Diagnosis Date Noted  . Hypophosphatemia 07/31/2019  . Alcohol use disorder, severe, dependence (HCC)   . Prolonged QT interval 03/16/2019  . Hypokalemia 02/04/2019  . 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 FINGER SURGERY    . IRRIGATION AND DEBRIDEMENT SHOULDER Right 11/12/2018   Procedure: Irrigation And Debridement Shoulder;  Surgeon: Roby Lofts, MD;  Location: [facility] OR;  Service: Orthopedics;  Laterality: Right;  . ORIF CLAVICULAR FRACTURE Right 11/12/2018   Procedure: OPEN REDUCTION INTERNAL FIXATION (ORIF) CLAVICULAR FRACTURE;  Surgeon: Roby Lofts, MD;  Location: [facility] OR;  Service: Orthopedics;  Laterality: Right;     OB History   No obstetric history on file.     Family History  Problem Relation Age of Onset  . Alcohol abuse Other     Social History   Tobacco Use  . Smoking status: Current Every Day Smoker    Packs/day: 1.00  . Smokeless tobacco: Never Used  Substance Use Topics  . Alcohol use: Yes  . Drug use: Not Currently    Types: Heroin    Home Medications Prior to Admission medications   Medication Sig Start Date End Date Taking? Authorizing Provider  escitalopram (LEXAPRO) 10 MG tablet Take 1 tablet (10 mg total) by mouth daily. Patient not taking: Reported on 09/16/2019 03/22/19   Dhungel, Theda Belfast, MD  meloxicam (MOBIC) 7.5 MG tablet Take 1 tablet (7.5 mg total) by mouth daily. Patient not taking: Reported on 09/16/2019 03/23/19   Belinda Fisher, PA-C  naltrexone (DEPADE) 50 MG tablet Take 1 tablet (50 mg total) by mouth daily. Patient not taking: Reported on 09/16/2019 07/31/19   Katherine Roan, MD  potassium chloride (KLOR-CON) 10 MEQ tablet Take 1 tablet (10 mEq total) by mouth daily. 09/15/19   Lorelee New, PA-C    Allergies    Patient has no known allergies.  Review of Systems   Review of Systems  Constitutional: Negative for fever.  Respiratory: Negative for shortness of breath.   Cardiovascular: Negative for chest pain.  Gastrointestinal: Negative for abdominal pain.  Genitourinary: Negative for dysuria.  Psychiatric/Behavioral: Positive for suicidal ideas. The patient is nervous/anxious.   All other systems reviewed and are negative.   Physical Exam Updated Vital Signs BP 106/70   Pulse (!) 109   Temp 97.7 F (36.5 C) (Oral)   Resp 15   SpO2 97%   Physical Exam Vitals and nursing note reviewed.  Constitutional:      Appearance: She is well-developed.     Comments: Anxious and labile Somnolent but arousable  HENT:     Head: Normocephalic and atraumatic.     Mouth/Throat:     Mouth:  Mucous membranes are moist.  Eyes:     Pupils: Pupils are equal, round, and reactive to light.     Comments: Pupils 3 mm reactive bilaterally  Cardiovascular:     Rate and Rhythm: Regular rhythm. Tachycardia present.     Heart sounds: Normal heart sounds.  Pulmonary:     Effort: Pulmonary effort is normal. No respiratory distress.     Breath sounds: No wheezing.  Abdominal:     General: Bowel sounds are normal.     Palpations: Abdomen is soft.     Tenderness: There is no abdominal tenderness.  Skin:    General: Skin is warm and dry.  Neurological:     Mental Status: She is alert and oriented to person, place, and time.  Psychiatric:     Comments: Anxious and labile     ED Results / Procedures / Treatments   Labs (all labs ordered are listed, but only abnormal results are displayed) Labs Reviewed  COMPREHENSIVE METABOLIC PANEL - Abnormal; Notable for the following components:      Result Value   Sodium 150 (*)    Chloride 115 (*)    Calcium 7.9 (*)    Total Protein 6.3 (*)    All other components within normal limits  ETHANOL - Abnormal; Notable for the following components:   Alcohol, Ethyl (B) 448 (*)    All other components within normal limits  ACETAMINOPHEN LEVEL - Abnormal; Notable for the following components:   Acetaminophen (Tylenol), Serum <10 (*)    All other components within normal limits  SALICYLATE LEVEL - Abnormal; Notable for the following components:   Salicylate Lvl <7.0 (*)    All other components within normal limits  ACETAMINOPHEN LEVEL - Abnormal; Notable for the following components:   Acetaminophen (Tylenol), Serum <10 (*)    All other components within normal limits  CBC WITH DIFFERENTIAL/PLATELET  RAPID URINE DRUG SCREEN, HOSP PERFORMED    EKG EKG Interpretation  Date/Time:  Saturday September 16 2019 23:34:59 EDT Ventricular Rate:  104 PR Interval:    QRS Duration: 89 QT Interval:  341 QTC Calculation: 449 R Axis:   80 Text  Interpretation: Sinus tachycardia Low voltage, precordial leads Confirmed by Ross Marcus ([id number]) on 09/17/2019 12:52:49 AM   Radiology No results found.  Procedures Procedures (including critical care time)  Medications Ordered in ED Medications - No data to display  ED Course  I have reviewed the triage vital signs and the nursing notes.  Pertinent labs & imaging results that were available during my care of the patient were reviewed by me and considered in my medical decision making (see chart for details).    MDM Rules/Calculators/A&P                       Patient presents with possible overdose.  She denies NyQuil ingestion to me but does report excessive alcohol use.  She also reports trying to harm herself.  She is very sleepy but arousable on exam and otherwise nontoxic.  Mildly tachycardic.  Lab work obtained.  Blood alcohol level greater than 400.  Initial Tylenol and salicylate levels are negative.  Patient observed in the emergency room for greater than 4 hours.  Repeat Tylenol levels at 4 hours are negative.  Low suspicion for NyQuil ingestion and likely just ongoing alcohol abuse and misuse.  On recheck, she is awake, alert, and oriented.  She denies SI or HI.  Patient will be given resources for outpatient and residential counseling.  After history, exam, and medical workup I feel the patient has been appropriately medically screened and is safe for discharge home. Pertinent diagnoses were discussed with the patient. Patient was given return precautions.   Final Clinical Impression(s) / ED Diagnoses Final diagnoses:  Alcohol abuse    Rx / DC Orders ED Discharge Orders    None       Maleia Weems, Barbette Hair, MD 09/17/19 [phone]

## 2019-09-29 ENCOUNTER — Other Ambulatory Visit: Payer: Self-pay

## 2019-09-29 ENCOUNTER — Emergency Department (HOSPITAL_BASED_OUTPATIENT_CLINIC_OR_DEPARTMENT_OTHER)
Admission: EM | Admit: 2019-09-29 | Discharge: 2019-09-29 | Disposition: A | Discharge status: Home / Self Care | Payer: Self-pay | Attending: Emergency Medicine | Admitting: Emergency Medicine

## 2019-09-29 ENCOUNTER — Encounter (HOSPITAL_BASED_OUTPATIENT_CLINIC_OR_DEPARTMENT_OTHER): Payer: Self-pay | Admitting: *Deleted

## 2019-09-29 DIAGNOSIS — F10930 Alcohol use, unspecified with withdrawal, uncomplicated: Secondary | ICD-10-CM

## 2019-09-29 DIAGNOSIS — F10230 Alcohol dependence with withdrawal, uncomplicated: Secondary | ICD-10-CM | POA: Insufficient documentation

## 2019-09-29 DIAGNOSIS — R252 Cramp and spasm: Secondary | ICD-10-CM | POA: Insufficient documentation

## 2019-09-29 DIAGNOSIS — Z79899 Other long term (current) drug therapy: Secondary | ICD-10-CM | POA: Insufficient documentation

## 2019-09-29 DIAGNOSIS — F17210 Nicotine dependence, cigarettes, uncomplicated: Secondary | ICD-10-CM | POA: Insufficient documentation

## 2019-09-29 DIAGNOSIS — Y907 Blood alcohol level of 200-239 mg/100 ml: Secondary | ICD-10-CM | POA: Insufficient documentation

## 2019-09-29 DIAGNOSIS — E876 Hypokalemia: Secondary | ICD-10-CM | POA: Insufficient documentation

## 2019-09-29 LAB — COMPREHENSIVE METABOLIC PANEL
ALT: 21 U/L (ref 0–44)
AST: 31 U/L (ref 15–41)
Albumin: 4.7 g/dL (ref 3.5–5.0)
Alkaline Phosphatase: 81 U/L (ref 38–126)
Anion gap: 15 (ref 5–15)
BUN: 10 mg/dL (ref 6–20)
CO2: 33 mmol/L — ABNORMAL HIGH (ref 22–32)
Calcium: 9.1 mg/dL (ref 8.9–10.3)
Chloride: 90 mmol/L — ABNORMAL LOW (ref 98–111)
Creatinine, Ser: 0.83 mg/dL (ref 0.44–1.00)
GFR calc Af Amer: >60 mL/min (ref >60)
GFR calc non Af Amer: >60 mL/min (ref >60)
Glucose, Bld: 124 mg/dL — ABNORMAL HIGH (ref 70–99)
Potassium: 3 mmol/L — ABNORMAL LOW (ref 3.5–5.1)
Sodium: 138 mmol/L (ref 135–145)
Total Bilirubin: 0.8 mg/dL (ref 0.3–1.2)
Total Protein: 8.1 g/dL (ref 6.5–8.1)

## 2019-09-29 LAB — URINALYSIS, ROUTINE W REFLEX MICROSCOPIC
Bilirubin Urine: NEGATIVE
Glucose, UA: NEGATIVE mg/dL
Hgb urine dipstick: NEGATIVE
Ketones, ur: NEGATIVE mg/dL
Leukocytes,Ua: NEGATIVE
Nitrite: NEGATIVE
Protein, ur: NEGATIVE mg/dL
Specific Gravity, Urine: 1.01 (ref 1.005–1.030)
pH: 8 (ref 5.0–8.0)

## 2019-09-29 LAB — CBC WITH DIFFERENTIAL/PLATELET
Abs Immature Granulocytes: 0.01 10*3/uL (ref 0.00–0.07)
Basophils Absolute: 0 10*3/uL (ref 0.0–0.1)
Basophils Relative: 1 %
Eosinophils Absolute: 0 10*3/uL (ref 0.0–0.5)
Eosinophils Relative: 0 %
HCT: 44.6 % (ref 36.0–46.0)
Hemoglobin: 15.2 g/dL — ABNORMAL HIGH (ref 12.0–15.0)
Immature Granulocytes: 0 %
Lymphocytes Relative: 42 %
Lymphs Abs: 1.4 10*3/uL (ref 0.7–4.0)
MCH: 32.5 pg (ref 26.0–34.0)
MCHC: 34.1 g/dL (ref 30.0–36.0)
MCV: 95.3 fL (ref 80.0–100.0)
Monocytes Absolute: 0.2 10*3/uL (ref 0.1–1.0)
Monocytes Relative: 7 %
Neutro Abs: 1.7 10*3/uL (ref 1.7–7.7)
Neutrophils Relative %: 50 %
Platelets: 401 10*3/uL — ABNORMAL HIGH (ref 150–400)
RBC: 4.68 MIL/uL (ref 3.87–5.11)
RDW: 13.8 % (ref 11.5–15.5)
WBC: 3.4 10*3/uL — ABNORMAL LOW (ref 4.0–10.5)
nRBC: 0 % (ref 0.0–0.2)

## 2019-09-29 LAB — ETHANOL: Alcohol, Ethyl (B): 230 mg/dL — ABNORMAL HIGH (ref <10)

## 2019-09-29 MED ORDER — LORAZEPAM 2 MG/ML IJ SOLN: 0.0000 mg | Freq: Two times a day (BID) | INTRAMUSCULAR | Status: DC

## 2019-09-29 MED ORDER — SODIUM CHLORIDE 0.9 % IV BOLUS
1000.0000 mL | Freq: Once | INTRAVENOUS | Status: AC
  Administered 2019-09-29: 1000 mL via INTRAVENOUS

## 2019-09-29 MED ORDER — POTASSIUM CHLORIDE 10 MEQ/100ML IV SOLN
10.0000 meq | Freq: Once | INTRAVENOUS | Status: AC
  Administered 2019-09-29: 10 meq via INTRAVENOUS
  Filled 2019-09-29: qty 100

## 2019-09-29 MED ORDER — THIAMINE HCL 100 MG/ML IJ SOLN
100.0000 mg | Freq: Every day | INTRAMUSCULAR | Status: DC
  Administered 2019-09-29: 100 mg via INTRAVENOUS
  Filled 2019-09-29: qty 2

## 2019-09-29 MED ORDER — LORAZEPAM 2 MG/ML IJ SOLN
0.0000 mg | Freq: Four times a day (QID) | INTRAMUSCULAR | Status: DC
  Administered 2019-09-29: 1 mg via INTRAVENOUS
  Filled 2019-09-29: qty 1

## 2019-09-29 MED ORDER — POTASSIUM CHLORIDE ER 10 MEQ PO TBCR: 10.0000 meq | EXTENDED_RELEASE_TABLET | Freq: Every day | ORAL | 0 refills | Status: DC

## 2019-09-29 MED ORDER — CHLORDIAZEPOXIDE HCL 25 MG PO CAPS: ORAL_CAPSULE | ORAL | 0 refills | Status: DC

## 2019-09-29 MED ORDER — LORAZEPAM 1 MG PO TABS: 0.0000 mg | ORAL_TABLET | Freq: Two times a day (BID) | ORAL | Status: DC

## 2019-09-29 MED ORDER — LORAZEPAM 1 MG PO TABS: 0.0000 mg | ORAL_TABLET | Freq: Four times a day (QID) | ORAL | Status: DC

## 2019-09-29 MED ORDER — THIAMINE HCL 100 MG PO TABS: 100.0000 mg | ORAL_TABLET | Freq: Every day | ORAL | Status: DC

## 2019-09-29 NOTE — ED Triage Notes (Signed)
To ED via EMS c/o hx of low potassium. She is having pain in her hands and feet. She told EMS she is here to get medication for her potassium so she can go home.

## 2019-09-29 NOTE — Discharge Instructions (Addendum)
Please pick up medication and take as prescribed. I have provided 5 days worth of potassium supplements to take. Please have your potassium level rechecked in 1-2 weeks. I have also provided prescription for Librium to take as prescribed to help wean off of alcohol use given plans for rehab next week.   Drink plenty of water to stay hydrated given your cramping is likely due to a combination of dehydration and low potassium.   Return to the ED IMMEDIATELY for any worsening symptoms including tremors, hallucinations, palpitations, nausea, vomiting, abdominal cramping, diarrhea, or any other concerning symptoms.

## 2019-09-29 NOTE — ED Notes (Signed)
Pt ambulated with steady gait and no assistance around the nurses station. PT O2 saturation when returned was 100%, PT had no complaints at this time. Will continue to monitor.

## 2019-09-29 NOTE — ED Provider Notes (Signed)
[facility] EMERGENCY DEPARTMENT Provider Note   CSN: [id number] Arrival date & time: 09/29/19  1305     History Chief Complaint  Patient presents with  . Possible Low Potassium    Sheila Graves is a 39 y.o. female with PMHx alcohol abuse and alcohol withdrawal seizure who presents to the ED today via EMS for "low potassium."  She endorses that she is having cramping and pain in her bilateral hands and bilateral feet x1 day.  She states this typically happens never her potassium is low.  Patient does endorse that she is a heavy alcohol user.  She states she does not typically drink every day however states that when she does drink she drinks heavily.  States that she last drank yesterday and approximates 8 drinks of small liquor bottles.  She does endorse that she is planning to go to a rehab in 5 days time and is serious about quitting.  She states that she is here just to have her potassium level checked and to receive potassium supplementation.  She denies any fevers, chills, chest pain, shortness of breath, abdominal pain, nausea, vomiting, diarrhea, any other associated symptoms.   Per chart review pt most recently seen in the ED on 03/19 for alcohol intoxication and found to have potassium 2.9. She was discharged home with potassium supplements and reports that she finished the entire course.   The history is provided by the patient and medical records.       Past Medical History:  Diagnosis Date  . Alcohol withdrawal seizure (HCC)   . ETOH abuse   . Hypokalemia   . Pancreatitis   . Thyroid disease     Patient Active Problem List   Diagnosis Date Noted  . Hypophosphatemia 07/31/2019  . Alcohol use disorder, severe, dependence (HCC)   . Prolonged QT interval 03/16/2019  . Hypokalemia 02/04/2019  . 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 FINGER SURGERY    .  IRRIGATION AND DEBRIDEMENT SHOULDER Right 11/12/2018   Procedure: Irrigation And Debridement Shoulder;  Surgeon: Roby Lofts, MD;  Location: [facility] OR;  Service: Orthopedics;  Laterality: Right;  . ORIF CLAVICULAR FRACTURE Right 11/12/2018   Procedure: OPEN REDUCTION INTERNAL FIXATION (ORIF) CLAVICULAR FRACTURE;  Surgeon: Roby Lofts, MD;  Location: [facility] OR;  Service: Orthopedics;  Laterality: Right;     OB History   No obstetric history on file.     Family History  Problem Relation Age of Onset  . Alcohol abuse Other     Social History   Tobacco Use  . Smoking status: Current Every Day Smoker    Packs/day: 1.00  . Smokeless tobacco: Never Used  Substance Use Topics  . Alcohol use: Yes  . Drug use: Not Currently    Types: Heroin    Home Medications Prior to Admission medications   Medication Sig Start Date End Date Taking? Authorizing Provider  chlordiazePOXIDE (LIBRIUM) 25 MG capsule 50mg  PO TID x 1D, then 25-50mg  PO BID X 1D, then 25-50mg  PO QD X 1D 09/29/19   Jhonatan Lomeli, PA-C  escitalopram (LEXAPRO) 10 MG tablet Take 1 tablet (10 mg total) by mouth daily. Patient not taking: Reported on 09/16/2019 03/22/19   Dhungel, 03/24/19, MD  meloxicam (MOBIC) 7.5 MG tablet Take 1 tablet (7.5 mg total) by mouth daily. Patient not taking: Reported on 09/16/2019 03/23/19   03/25/19, PA-C  naltrexone (  DEPADE) 50 MG tablet Take 1 tablet (50 mg total) by mouth daily. Patient not taking: Reported on 09/16/2019 07/31/19   Katherine Roan, MD  potassium chloride (KLOR-CON) 10 MEQ tablet Take 1 tablet (10 mEq total) by mouth daily for 5 days. 09/29/19 10/04/19  Tanda Rockers, PA-C    Allergies    Patient has no known allergies.  Review of Systems   Review of Systems  Constitutional: Negative for chills and fever.  Respiratory: Negative for shortness of breath.   Cardiovascular: Negative for chest pain.  Gastrointestinal: Negative for abdominal pain, diarrhea, nausea and vomiting.   Musculoskeletal: Positive for myalgias.  All other systems reviewed and are negative.   Physical Exam Updated Vital Signs BP (!) 88/68   Pulse (!) 111   Temp 97.8 F (36.6 C) (Oral)   Resp 16   Ht 5\' 10"  (1.778 m)   Wt 58.3 kg   SpO2 99%   BMI 18.44 kg/m   Physical Exam Vitals and nursing note reviewed.  Constitutional:      Appearance: She is not ill-appearing or diaphoretic.  HENT:     Head: Normocephalic and atraumatic.  Eyes:     Conjunctiva/sclera: Conjunctivae normal.  Cardiovascular:     Rate and Rhythm: Normal rate and regular rhythm.     Pulses: Normal pulses.  Pulmonary:     Effort: Pulmonary effort is normal.     Breath sounds: Normal breath sounds. No wheezing, rhonchi or rales.  Abdominal:     Palpations: Abdomen is soft.     Tenderness: There is no abdominal tenderness. There is no guarding or rebound.  Musculoskeletal:     Cervical back: Neck supple.  Skin:    General: Skin is warm and dry.  Neurological:     Mental Status: She is alert.     Comments: CN 3-12 grossly intact A&O x4 GCS 15 Sensation and strength intact Gait nonataxic including with tandem walking Coordination with finger-to-nose WNL Neg romberg, neg pronator drift     ED Results / Procedures / Treatments   Labs (all labs ordered are listed, but only abnormal results are displayed) Labs Reviewed  COMPREHENSIVE METABOLIC PANEL - Abnormal; Notable for the following components:      Result Value   Potassium 3.0 (*)    Chloride 90 (*)    CO2 33 (*)    Glucose, Bld 124 (*)    All other components within normal limits  CBC WITH DIFFERENTIAL/PLATELET - Abnormal; Notable for the following components:   WBC 3.4 (*)    Hemoglobin 15.2 (*)    Platelets 401 (*)    All other components within normal limits  ETHANOL - Abnormal; Notable for the following components:   Alcohol, Ethyl (B) 230 (*)    All other components within normal limits  URINALYSIS, ROUTINE W REFLEX MICROSCOPIC     EKG EKG Interpretation  Date/Time:  Friday September 29 2019 14:35:06 EDT Ventricular Rate:  100 PR Interval:    QRS Duration: 94 QT Interval:  378 QTC Calculation: 488 R Axis:   81 Text Interpretation: Sinus tachycardia Borderline prolonged QT interval Confirmed by 07-12-1982 [phone]) on 09/29/2019 3:06:55 PM   Radiology No results found.  Procedures Procedures (including critical care time)  Medications Ordered in ED Medications  LORazepam (ATIVAN) injection 0-4 mg (1 mg Intravenous Given 09/29/19 1435)    Or  LORazepam (ATIVAN) tablet 0-4 mg ( Oral See Alternative 09/29/19 1435)  LORazepam (ATIVAN) injection 0-4 mg (has no administration  in time range)    Or  LORazepam (ATIVAN) tablet 0-4 mg (has no administration in time range)  thiamine tablet 100 mg ( Oral See Alternative 09/29/19 1436)    Or  thiamine (B-1) injection 100 mg (100 mg Intravenous Given 09/29/19 1436)  sodium chloride 0.9 % bolus 1,000 mL (0 mLs Intravenous Stopped 09/29/19 1609)  potassium chloride 10 mEq in 100 mL IVPB (0 mEq Intravenous Stopped 09/29/19 1540)    ED Course  I have reviewed the triage vital signs and the nursing notes.  Pertinent labs & imaging results that were available during my care of the patient were reviewed by me and considered in my medical decision making (see chart for details).  Clinical Course as of Sep 29 1622  Fri Sep 29, 2019  1532 Alcohol, Ethyl (B)(!): 230 [MV]    Clinical Course User Index [MV] Eustaquio Maize, [place]   MDM Rules/Calculators/A&P                      39 year old female with a history of alcohol abuse who presents to the ED today complaining of low potassium as she is having hand and foot cramping which is typical for her when she has hypokalemia.  Strength yesterday.  On arrival patient is afebrile, tachycardic in the 110s, nontachypneic.  Blood pressure low 88/68.  Will provide fluids.  Obtain screening labs at this time as well as EtOH.  Patient last drink  yesterday, will place on [place] protocol given history of heavy alcohol abuse.  Patient is planning on going to a rehab in 5 days time and is serious about quitting.  She has no focal neuro deficits on exam today and does not appear intoxicated.   CIWA score of 6. Pt received 1 mg Ativan.  CBC without leukocytosis.  Hemoglobin stable. CMP with potassium 3.0. Glucose 124. Bicarb 33. And Chloride 90. Will replete potassium in the ED and discharge home with same.  EtOH level 230 however pt typically in the 300-400s. Again she does not appear clinically intoxicated. Suspect she may be having some withdrawal symptoms given elevated CIWA and tachycardia despite elevated EtOH since she is a chronic EtOH user. Will continue to monitor and discharge home with librium taper.   On reevaluation pt resting comfortably. Reports the cramping in her hands is improving after potassium. Heart rate in the 90's. Will repeat CIWA and ambulate patient. Pt reports interest in starting librium prior to going to rehab on Tuesday; will prescribe.   Repeat CIWA of 1.  Pt ambulated with steady gait and no assistance required. Feel she is stable for discharge home at this time. Rx for librium sent to pharmacy. Strict return precautions discussed with pt. She is in agreement with plan and stable for discharge home.   This note was prepared using Dragon voice recognition software and may include unintentional dictation errors due to the inherent limitations of voice recognition software.   Final Clinical Impression(s) / ED Diagnoses Final diagnoses:  Hypokalemia  Alcohol withdrawal syndrome without complication (HCC)  Cramping of hands    Rx / DC Orders ED Discharge Orders         Ordered    chlordiazePOXIDE (LIBRIUM) 25 MG capsule     09/29/19 1619    potassium chloride (KLOR-CON) 10 MEQ tablet  Daily     09/29/19 1623           Discharge Instructions     Please pick up medication and take  as prescribed. I  have provided 5 days worth of potassium supplements to take. Please have your potassium level rechecked in 1-2 weeks. I have also provided prescription for Librium to take as prescribed to help wean off of alcohol use given plans for rehab next week.   Drink plenty of water to stay hydrated given your cramping is likely due to a combination of dehydration and low potassium.   Return to the ED IMMEDIATELY for any worsening symptoms including tremors, hallucinations, palpitations, nausea, vomiting, abdominal cramping, diarrhea, or any other concerning symptoms.        Tanda Rockers, PA-C 09/29/19 1625    Virgina Norfolk, DO 10/02/19 [phone]

## 2019-09-30 ENCOUNTER — Encounter (HOSPITAL_COMMUNITY): Payer: Self-pay

## 2019-09-30 ENCOUNTER — Other Ambulatory Visit: Payer: Self-pay

## 2019-09-30 ENCOUNTER — Emergency Department (HOSPITAL_COMMUNITY)
Admission: EM | Admit: 2019-09-30 | Discharge: 2019-09-30 | Disposition: A | Discharge status: Home / Self Care | Payer: Self-pay | Attending: Emergency Medicine | Admitting: Emergency Medicine

## 2019-09-30 DIAGNOSIS — F17200 Nicotine dependence, unspecified, uncomplicated: Secondary | ICD-10-CM | POA: Insufficient documentation

## 2019-09-30 DIAGNOSIS — F10920 Alcohol use, unspecified with intoxication, uncomplicated: Secondary | ICD-10-CM

## 2019-09-30 DIAGNOSIS — F10220 Alcohol dependence with intoxication, uncomplicated: Secondary | ICD-10-CM | POA: Insufficient documentation

## 2019-09-30 LAB — URINALYSIS, ROUTINE W REFLEX MICROSCOPIC
Bilirubin Urine: NEGATIVE
Glucose, UA: NEGATIVE mg/dL
Hgb urine dipstick: NEGATIVE
Ketones, ur: NEGATIVE mg/dL
Leukocytes,Ua: NEGATIVE
Nitrite: NEGATIVE
Protein, ur: NEGATIVE mg/dL
Specific Gravity, Urine: 1.005 (ref 1.005–1.030)
pH: 9 — ABNORMAL HIGH (ref 5.0–8.0)

## 2019-09-30 LAB — ETHANOL: Alcohol, Ethyl (B): 454 mg/dL (ref <10)

## 2019-09-30 LAB — CBC WITH DIFFERENTIAL/PLATELET
Abs Immature Granulocytes: 0.01 10*3/uL (ref 0.00–0.07)
Basophils Absolute: 0.1 10*3/uL (ref 0.0–0.1)
Basophils Relative: 1 %
Eosinophils Absolute: 0 10*3/uL (ref 0.0–0.5)
Eosinophils Relative: 0 %
HCT: 45.3 % (ref 36.0–46.0)
Hemoglobin: 14.9 g/dL (ref 12.0–15.0)
Immature Granulocytes: 0 %
Lymphocytes Relative: 39 %
Lymphs Abs: 1.8 10*3/uL (ref 0.7–4.0)
MCH: 32 pg (ref 26.0–34.0)
MCHC: 32.9 g/dL (ref 30.0–36.0)
MCV: 97.4 fL (ref 80.0–100.0)
Monocytes Absolute: 0.3 10*3/uL (ref 0.1–1.0)
Monocytes Relative: 6 %
Neutro Abs: 2.4 10*3/uL (ref 1.7–7.7)
Neutrophils Relative %: 54 %
Platelets: 352 10*3/uL (ref 150–400)
RBC: 4.65 MIL/uL (ref 3.87–5.11)
RDW: 13.9 % (ref 11.5–15.5)
WBC: 4.5 10*3/uL (ref 4.0–10.5)
nRBC: 0 % (ref 0.0–0.2)

## 2019-09-30 LAB — BASIC METABOLIC PANEL
Anion gap: 13 (ref 5–15)
BUN: 15 mg/dL (ref 6–20)
CO2: 32 mmol/L (ref 22–32)
Calcium: 8.5 mg/dL — ABNORMAL LOW (ref 8.9–10.3)
Chloride: 97 mmol/L — ABNORMAL LOW (ref 98–111)
Creatinine, Ser: 0.78 mg/dL (ref 0.44–1.00)
GFR calc Af Amer: >60 mL/min (ref >60)
GFR calc non Af Amer: >60 mL/min (ref >60)
Glucose, Bld: 112 mg/dL — ABNORMAL HIGH (ref 70–99)
Potassium: 3.1 mmol/L — ABNORMAL LOW (ref 3.5–5.1)
Sodium: 142 mmol/L (ref 135–145)

## 2019-09-30 LAB — ACETAMINOPHEN LEVEL: Acetaminophen (Tylenol), Serum: <10 ug/mL — ABNORMAL LOW (ref 10–30)

## 2019-09-30 LAB — I-STAT BETA HCG BLOOD, ED (MC, WL, AP ONLY): I-stat hCG, quantitative: <5 m[IU]/mL (ref <5)

## 2019-09-30 LAB — SALICYLATE LEVEL: Salicylate Lvl: <7 mg/dL — ABNORMAL LOW (ref 7.0–30.0)

## 2019-09-30 MED ORDER — SODIUM CHLORIDE 0.9 % IV BOLUS
1000.0000 mL | Freq: Once | INTRAVENOUS | Status: AC
  Administered 2019-09-30: 16:00:00 1000 mL via INTRAVENOUS

## 2019-09-30 MED ORDER — THIAMINE HCL 100 MG/ML IJ SOLN
Freq: Once | INTRAVENOUS | Status: AC
  Filled 2019-09-30: qty 1000

## 2019-09-30 MED ORDER — SODIUM CHLORIDE 0.9 % IV BOLUS
500.0000 mL | Freq: Once | INTRAVENOUS | Status: AC
  Administered 2019-09-30: 500 mL via INTRAVENOUS

## 2019-09-30 NOTE — Discharge Instructions (Addendum)
Please return for any problem.  Follow-up with your regular care provider as instructed.  Please drink alcohol in moderation.
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
## 2019-09-30 NOTE — ED Notes (Signed)
Patient lying in bed asleep and calm. Patient vitals are stable and patient is arousable

## 2019-09-30 NOTE — ED Provider Notes (Signed)
[facility] DEPT Provider Note   CSN: [id number] Arrival date & time: 09/30/19  1445     History Chief Complaint  Patient presents with  . Alcohol Intoxication    Tiffony Kite is a 39 y.o. female.  39 year old female with prior medical history as detailed below presents for evaluation.  Patient reportedly arrives from home.  She was found by family was a half empty bottle of vodka next to her.  The family is concerned that she is drunk too much.  Patient has longstanding history of excessive alcohol use.  EMS reports that she was somnolent and difficult to arouse in route to the ED.  Patient is arousable with sternal rub.  She tells me to "not talking do that again" - patient does admit to drinking "a lot of vodka" this morning.  Patient denies coingestion.  The history is provided by the patient, medical records and the EMS personnel.  Alcohol Intoxication This is a recurrent problem. The current episode started 3 to 5 hours ago. The problem has not changed since onset.Pertinent negatives include no chest pain, no abdominal pain, no headaches and no shortness of breath. Nothing aggravates the symptoms. Nothing relieves the symptoms.       Past Medical History:  Diagnosis Date  . Alcohol withdrawal seizure ([place])   . ETOH abuse   . Hypokalemia   . Pancreatitis   . Thyroid disease     Patient Active Problem List   Diagnosis Date Noted  . Hypophosphatemia 07/31/2019  . Alcohol use disorder, severe, dependence ([place])   . Prolonged QT interval 03/16/2019  . Hypokalemia 02/04/2019  . Major depressive disorder, recurrent, severe without psychotic features ([place]) 01/31/2018    Past Surgical History:  Procedure Laterality Date  . FINGER SURGERY    . IRRIGATION AND DEBRIDEMENT SHOULDER Right 11/12/2018   Procedure: Irrigation And Debridement Shoulder;  Surgeon: Shona Needles, MD;  Location: [facility];  Service: Orthopedics;  Laterality: Right;  . ORIF  CLAVICULAR FRACTURE Right 11/12/2018   Procedure: OPEN REDUCTION INTERNAL FIXATION (ORIF) CLAVICULAR FRACTURE;  Surgeon: Shona Needles, MD;  Location: [facility];  Service: Orthopedics;  Laterality: Right;     OB History   No obstetric history on file.     Family History  Problem Relation Age of Onset  . Alcohol abuse Other     Social History   Tobacco Use  . Smoking status: Current Every Day Smoker    Packs/day: 1.00  . Smokeless tobacco: Never Used  Substance Use Topics  . Alcohol use: Yes  . Drug use: Not Currently    Types: Heroin    Home Medications Prior to Admission medications   Medication Sig Start Date End Date Taking? Authorizing Provider  chlordiazePOXIDE (LIBRIUM) 25 MG capsule 50mg  PO TID x 1D, then 25-50mg  PO BID X 1D, then 25-50mg  PO QD X 1D 09/29/19   Venter, Margaux, PA-C  escitalopram (LEXAPRO) 10 MG tablet Take 1 tablet (10 mg total) by mouth daily. Patient not taking: Reported on 09/16/2019 03/22/19   Dhungel, Flonnie Overman, MD  meloxicam (MOBIC) 7.5 MG tablet Take 1 tablet (7.5 mg total) by mouth daily. Patient not taking: Reported on 09/16/2019 03/23/19   Ok Edwards, PA-C  naltrexone (DEPADE) 50 MG tablet Take 1 tablet (50 mg total) by mouth daily. Patient not taking: Reported on 09/16/2019 07/31/19   Jeanmarie Hubert, MD  potassium chloride (KLOR-CON) 10 MEQ tablet Take 1 tablet (10 mEq total) by mouth daily for  5 days. 09/29/19 10/04/19  Tanda Rockers, PA-C    Allergies    Patient has no known allergies.  Review of Systems   Review of Systems  Respiratory: Negative for shortness of breath.   Cardiovascular: Negative for chest pain.  Gastrointestinal: Negative for abdominal pain.  Neurological: Negative for headaches.  All other systems reviewed and are negative.   Physical Exam Updated Vital Signs BP 106/74   Pulse 90   Temp 97.6 F (36.4 C) (Oral)   Resp 15   Ht 5\' 10"  (1.778 m)   Wt 58.3 kg   SpO2 95%   BMI 18.44 kg/m   Physical Exam Vitals and  nursing note reviewed.  Constitutional:      General: She is not in acute distress.    Appearance: Normal appearance. She is well-developed.  HENT:     Head: Normocephalic and atraumatic.  Eyes:     Conjunctiva/sclera: Conjunctivae normal.     Pupils: Pupils are equal, round, and reactive to light.  Cardiovascular:     Rate and Rhythm: Normal rate and regular rhythm.     Heart sounds: Normal heart sounds.  Pulmonary:     Effort: Pulmonary effort is normal. No respiratory distress.     Breath sounds: Normal breath sounds.  Abdominal:     General: There is no distension.     Palpations: Abdomen is soft.     Tenderness: There is no abdominal tenderness.  Musculoskeletal:        General: No deformity. Normal range of motion.     Cervical back: Normal range of motion and neck supple.  Skin:    General: Skin is warm and dry.  Neurological:     Mental Status: She is alert and oriented to person, place, and time.     ED Results / Procedures / Treatments   Labs (all labs ordered are listed, but only abnormal results are displayed) Labs Reviewed  URINALYSIS, ROUTINE W REFLEX MICROSCOPIC - Abnormal; Notable for the following components:      Result Value   Color, Urine STRAW (*)    pH 9.0 (*)    All other components within normal limits  ETHANOL - Abnormal; Notable for the following components:   Alcohol, Ethyl (B) 454 (*)    All other components within normal limits  BASIC METABOLIC PANEL - Abnormal; Notable for the following components:   Potassium 3.1 (*)    Chloride 97 (*)    Glucose, Bld 112 (*)    Calcium 8.5 (*)    All other components within normal limits  SALICYLATE LEVEL - Abnormal; Notable for the following components:   Salicylate Lvl <7.0 (*)    All other components within normal limits  ACETAMINOPHEN LEVEL - Abnormal; Notable for the following components:   Acetaminophen (Tylenol), Serum <10 (*)    All other components within normal limits  CBC WITH  DIFFERENTIAL/PLATELET  I-STAT BETA HCG BLOOD, ED ([facility], [facility], [facility] ONLY)    EKG None  Radiology No results found.  Procedures Procedures (including critical care time)  Medications Ordered in ED Medications  sodium chloride 0.9 % bolus 1,000 mL (has no administration in time range)    ED Course  I have reviewed the triage vital signs and the nursing notes.  Pertinent labs & imaging results that were available during my care of the patient were reviewed by me and considered in my medical decision making (see chart for details).    MDM Rules/Calculators/A&P  MDM  Screen complete  Siera Beyersdorf was evaluated in Emergency Department on 09/30/2019 for the symptoms described in the history of present illness. She was evaluated in the context of the global COVID-19 pandemic, which necessitated consideration that the patient might be at risk for infection with the SARS-CoV-2 virus that causes COVID-19. Institutional protocols and algorithms that pertain to the evaluation of patients at risk for COVID-19 are in a state of rapid change based on information released by regulatory bodies including the CDC and federal and state organizations. These policies and algorithms were followed during the patient's care in the ED.  Patient is presenting for evaluation setting of acute alcohol intoxication.  Patient's exam and history are supportive of this diagnosis.  Screening labs reveal elevated alcohol level.  Following ED observation.  And administration of IV fluids and electrolytes patient feels improved.  She now desires discharge home.  Importance of close follow-up is stressed.  Strict return precautions given and understood.  At time of discharge patient assures me that she has plans for becoming sober through an outpatient detox program that she is supposed to start on Tuesday of this week.   Final Clinical Impression(s) / ED Diagnoses Final diagnoses:  Alcoholic  intoxication without complication [facility])    Rx / DC Orders ED Discharge Orders    None       Wynetta Fines, MD 09/30/19 1952

## 2019-09-30 NOTE — ED Notes (Signed)
Pt awake sitting up in bed. Steady on feet. Wife at bedside for d/c. NAD noted.

## 2019-09-30 NOTE — ED Notes (Signed)
Pt lying in bed eye's closed, chest rising and falling. Full monitor on. Will continue to monitor pt.

## 2019-09-30 NOTE — ED Notes (Signed)
Pt lying in bed, eye's closed, chest rising and falling. Pt awakens easily. Pt asking to leave and for food. Will continue to monitor.

## 2019-09-30 NOTE — ED Notes (Signed)
Pt awake in bed. Pt is calling her wife to pick her up. Sandwich given. NAD noted. Will continue to monitor.

## 2019-09-30 NOTE — ED Notes (Signed)
Date and time results received: 09/30/19 4:42 PM  Test: Ethanol Critical Value: 454  Name of Provider Notified: Messik

## 2019-10-07 IMAGING — US US ABDOMEN COMPLETE
1 series · 13 of 25 positions shown · non-contrast
Comparison: Ultrasound February 27, 2017.

CLINICAL DATA: Transaminitis.

EXAM:
ABDOMEN ULTRASOUND COMPLETE

[Series 1: us abdomen complete · 13 of 98 slices shown]
[im 1/98]
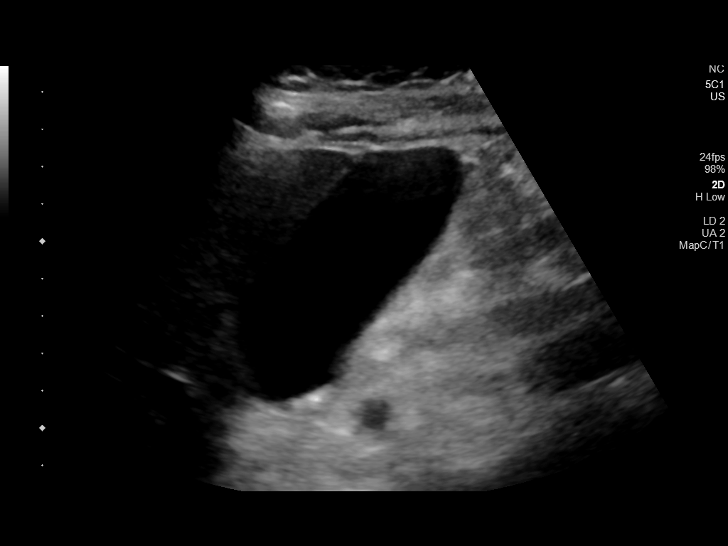
[im 9/98]
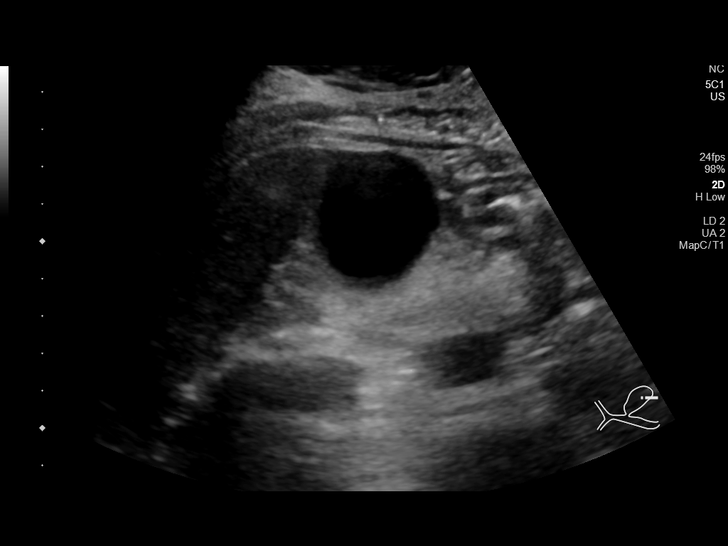
[im 17/98]
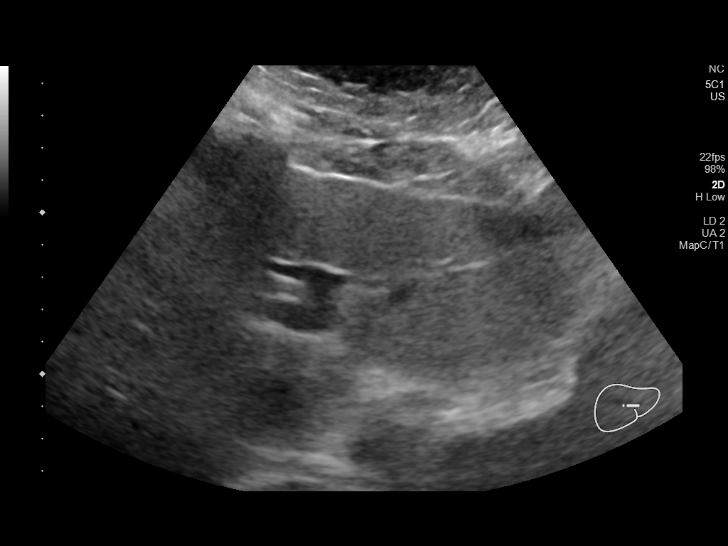
[im 25/98]
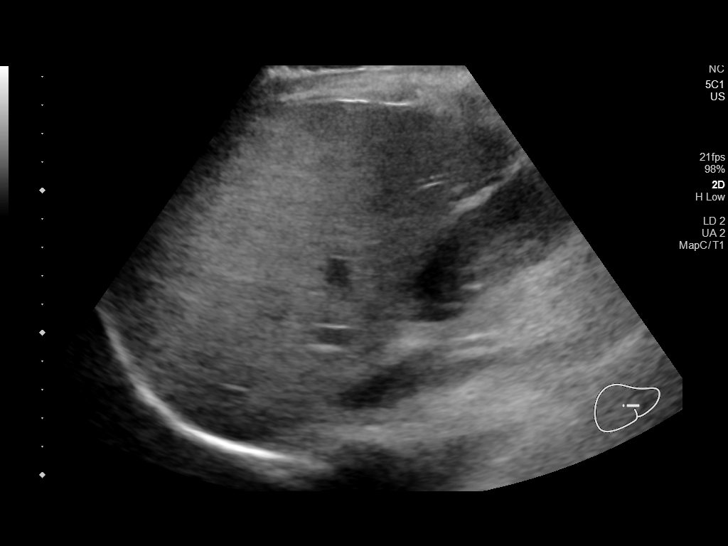
[im 33/98]
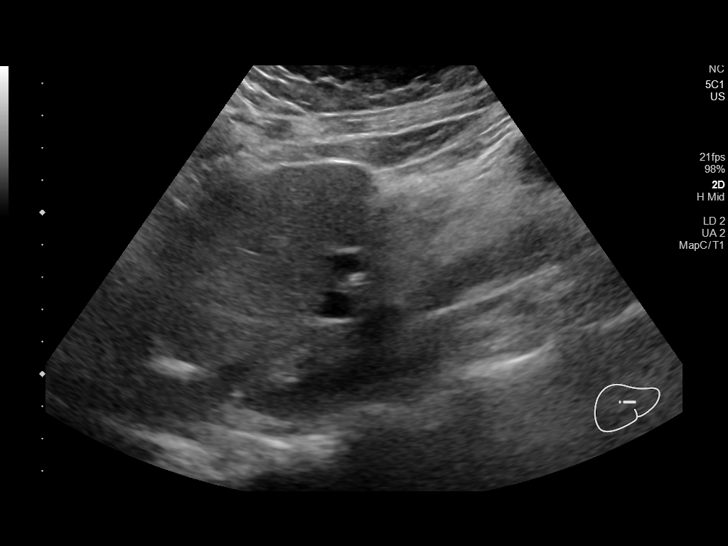
[im 41/98]
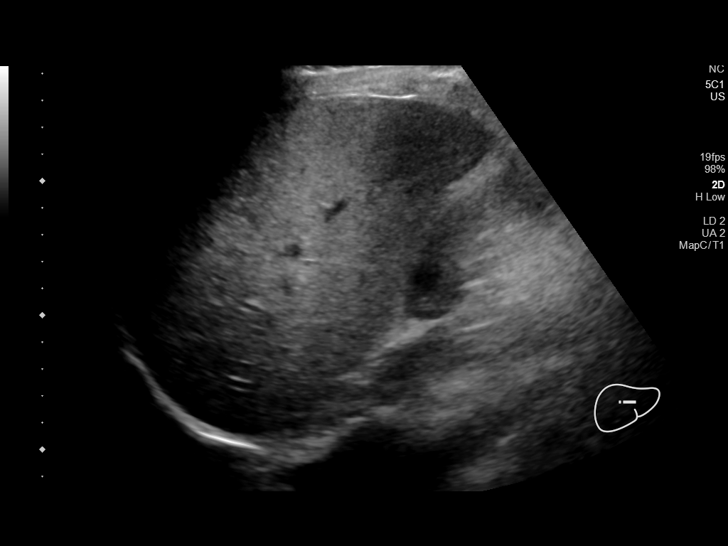
[im 49/98]
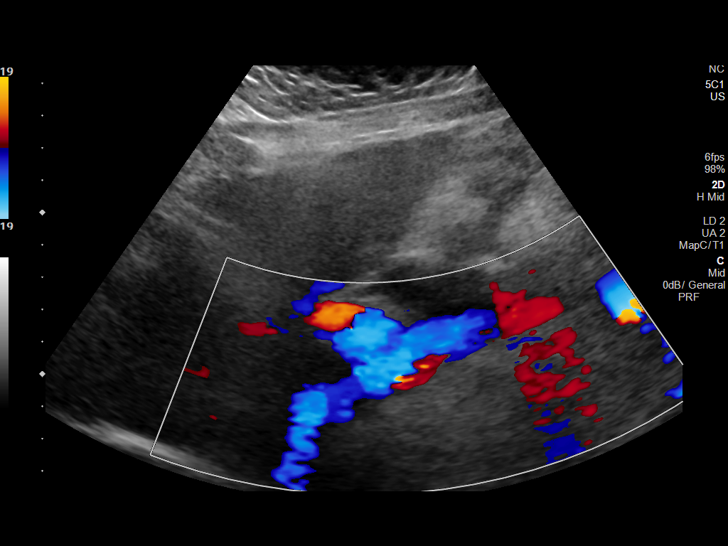
[im 57/98]
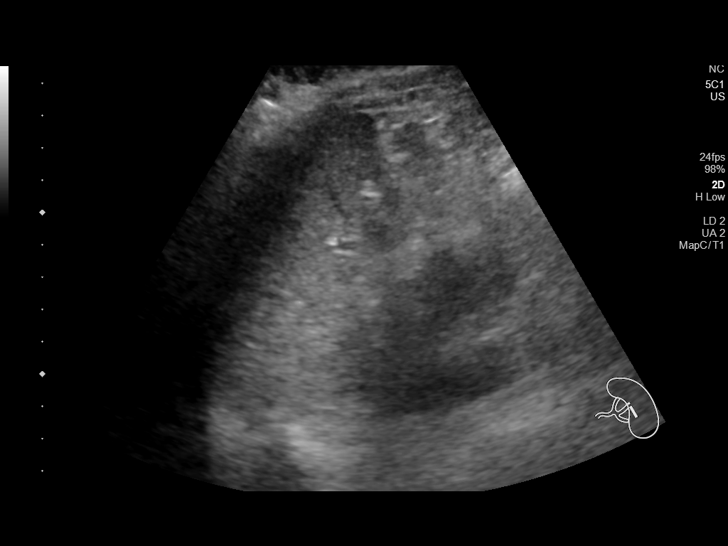
[im 65/98]
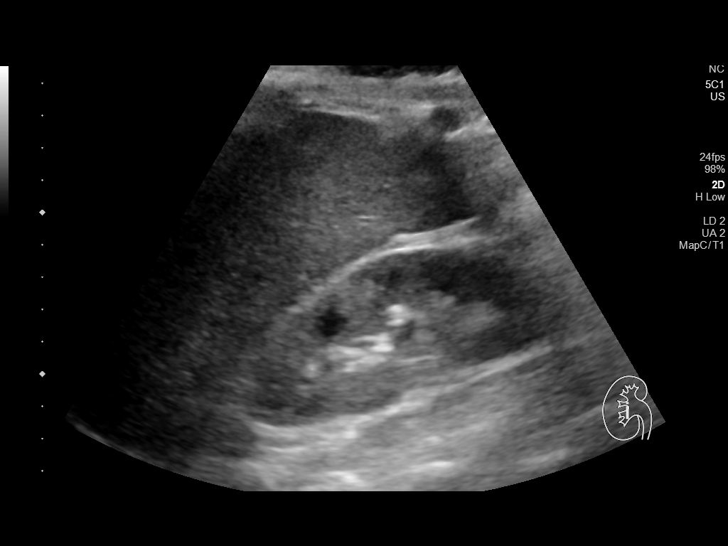
[im 73/98]
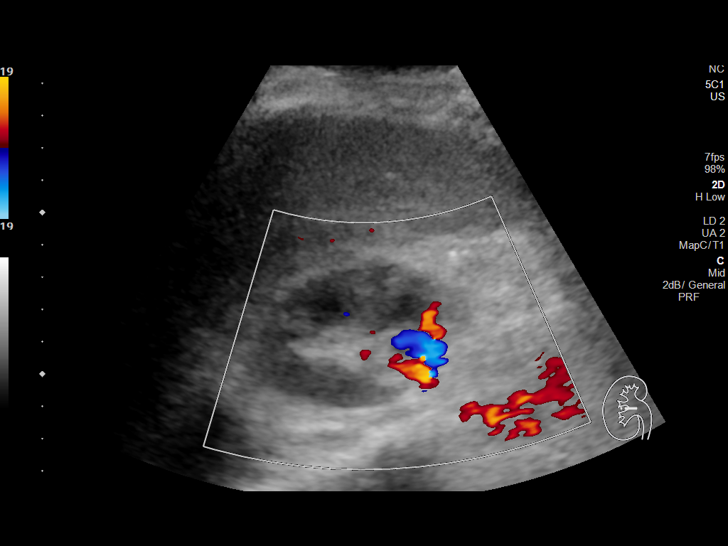
[im 81/98]
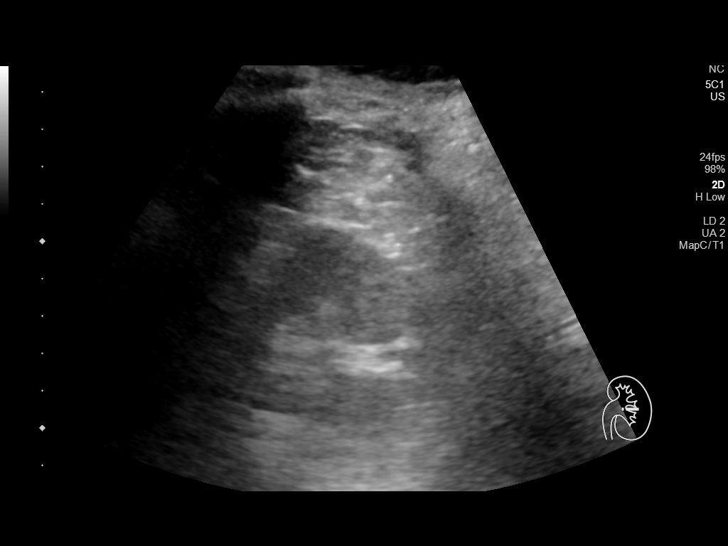
[im 89/98]
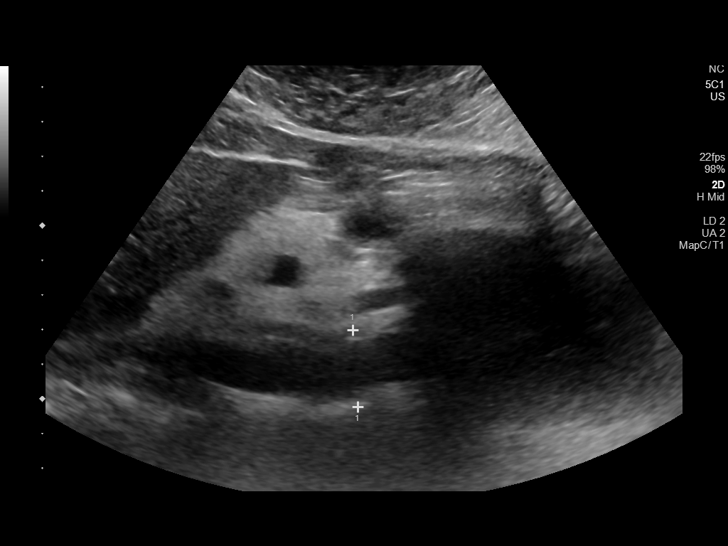
[im 98/98]
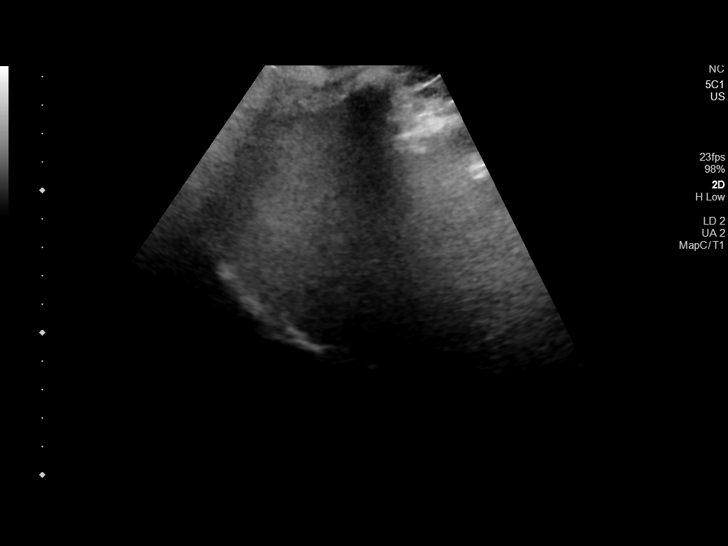

[13 of 25 positions shown; findings below may reference images not displayed]

FINDINGS: Gallbladder: No gallstones or wall thickening visualized. No
sonographic Murphy sign noted by sonographer.

Common bile duct: Diameter: 3 mm which is within normal limits.

Liver: No focal lesion identified. Within normal limits in
parenchymal echogenicity. Portal vein is patent on color Doppler
imaging with normal direction of blood flow towards the liver.

IVC: No abnormality visualized.

Pancreas: Visualized portion unremarkable.

Spleen: Size and appearance within normal limits.

Right Kidney: Length: 10.4 cm. Echogenicity within normal limits. No
mass or hydronephrosis visualized.

Left Kidney: Length: 10.2 cm. Echogenicity within normal limits. No
mass or hydronephrosis visualized.

Abdominal aorta: No aneurysm visualized.

Other findings: Large anechoic abnormality is noted in left upper
quadrant which contains multiple internal echoes most consistent
with dilated stomach.
IMPRESSION: Exam is somewhat limited as patient could not hold still. Large
anechoic abnormality with internal echoes is noted in left upper
quadrant which most likely represents significantly distended
stomach. No other abnormality seen in the abdomen.

## 2019-11-15 ENCOUNTER — Emergency Department (HOSPITAL_COMMUNITY)
Admission: EM | Admit: 2019-11-15 | Discharge: 2019-11-15 | Disposition: A | Discharge status: Home / Self Care | Payer: Self-pay | Attending: Emergency Medicine | Admitting: Emergency Medicine

## 2019-11-15 ENCOUNTER — Encounter (HOSPITAL_COMMUNITY): Payer: Self-pay

## 2019-11-15 ENCOUNTER — Other Ambulatory Visit: Payer: Self-pay

## 2019-11-15 ENCOUNTER — Encounter (HOSPITAL_BASED_OUTPATIENT_CLINIC_OR_DEPARTMENT_OTHER): Payer: Self-pay | Admitting: *Deleted

## 2019-11-15 DIAGNOSIS — R42 Dizziness and giddiness: Secondary | ICD-10-CM | POA: Insufficient documentation

## 2019-11-15 DIAGNOSIS — R110 Nausea: Secondary | ICD-10-CM | POA: Insufficient documentation

## 2019-11-15 DIAGNOSIS — F17210 Nicotine dependence, cigarettes, uncomplicated: Secondary | ICD-10-CM | POA: Insufficient documentation

## 2019-11-15 DIAGNOSIS — Z5321 Procedure and treatment not carried out due to patient leaving prior to being seen by health care provider: Secondary | ICD-10-CM | POA: Insufficient documentation

## 2019-11-15 DIAGNOSIS — F1120 Opioid dependence, uncomplicated: Secondary | ICD-10-CM | POA: Insufficient documentation

## 2019-11-15 DIAGNOSIS — E876 Hypokalemia: Secondary | ICD-10-CM | POA: Insufficient documentation

## 2019-11-15 LAB — CBC
HCT: 34.6 % — ABNORMAL LOW (ref 36.0–46.0)
Hemoglobin: 11.2 g/dL — ABNORMAL LOW (ref 12.0–15.0)
MCH: 31.1 pg (ref 26.0–34.0)
MCHC: 32.4 g/dL (ref 30.0–36.0)
MCV: 96.1 fL (ref 80.0–100.0)
Platelets: 284 10*3/uL (ref 150–400)
RBC: 3.6 MIL/uL — ABNORMAL LOW (ref 3.87–5.11)
RDW: 13.5 % (ref 11.5–15.5)
WBC: 4.4 10*3/uL (ref 4.0–10.5)
nRBC: 0 % (ref 0.0–0.2)

## 2019-11-15 LAB — I-STAT BETA HCG BLOOD, ED (MC, WL, AP ONLY): I-stat hCG, quantitative: <5 m[IU]/mL (ref <5)

## 2019-11-15 NOTE — ED Notes (Signed)
Critical K 2.7 
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
## 2019-11-16 ENCOUNTER — Emergency Department (HOSPITAL_BASED_OUTPATIENT_CLINIC_OR_DEPARTMENT_OTHER)
Admission: EM | Admit: 2019-11-16 | Discharge: 2019-11-16 | Disposition: A | Discharge status: Home / Self Care | Payer: Self-pay | Attending: Emergency Medicine | Admitting: Emergency Medicine

## 2019-11-16 DIAGNOSIS — R42 Dizziness and giddiness: Secondary | ICD-10-CM

## 2019-11-16 DIAGNOSIS — E876 Hypokalemia: Secondary | ICD-10-CM

## 2019-11-16 DIAGNOSIS — R110 Nausea: Secondary | ICD-10-CM

## 2019-11-16 LAB — BASIC METABOLIC PANEL
Anion gap: 13 (ref 5–15)
BUN: 8 mg/dL (ref 6–20)
CO2: 30 mmol/L (ref 22–32)
Calcium: 9.1 mg/dL (ref 8.9–10.3)
Chloride: 93 mmol/L — ABNORMAL LOW (ref 98–111)
Creatinine, Ser: 0.78 mg/dL (ref 0.44–1.00)
GFR calc Af Amer: >60 mL/min (ref >60)
GFR calc non Af Amer: >60 mL/min (ref >60)
Glucose, Bld: 89 mg/dL (ref 70–99)
Potassium: 2.7 mmol/L — CL (ref 3.5–5.1)
Sodium: 136 mmol/L (ref 135–145)

## 2019-11-16 LAB — URINALYSIS, ROUTINE W REFLEX MICROSCOPIC
Bilirubin Urine: NEGATIVE
Glucose, UA: NEGATIVE mg/dL
Hgb urine dipstick: NEGATIVE
Ketones, ur: NEGATIVE mg/dL
Leukocytes,Ua: NEGATIVE
Nitrite: NEGATIVE
Protein, ur: NEGATIVE mg/dL
Specific Gravity, Urine: 1.015 (ref 1.005–1.030)
pH: 8 (ref 5.0–8.0)

## 2019-11-16 LAB — RAPID URINE DRUG SCREEN, HOSP PERFORMED
Amphetamines: NOT DETECTED
Barbiturates: NOT DETECTED
Benzodiazepines: NOT DETECTED
Cocaine: NOT DETECTED
Opiates: NOT DETECTED
Tetrahydrocannabinol: NOT DETECTED

## 2019-11-16 LAB — ETHANOL: Alcohol, Ethyl (B): <10 mg/dL (ref <10)

## 2019-11-16 MED ORDER — POTASSIUM CHLORIDE ER 20 MEQ PO TBCR: 20.0000 meq | EXTENDED_RELEASE_TABLET | Freq: Every day | ORAL | 0 refills | Status: DC

## 2019-11-16 MED ORDER — SODIUM CHLORIDE 0.9 % IV BOLUS
1000.0000 mL | Freq: Once | INTRAVENOUS | Status: AC
  Administered 2019-11-16: 1000 mL via INTRAVENOUS

## 2019-11-16 MED ORDER — PROMETHAZINE HCL 25 MG/ML IJ SOLN
12.5000 mg | Freq: Once | INTRAMUSCULAR | Status: AC
  Administered 2019-11-16: 12.5 mg via INTRAVENOUS
  Filled 2019-11-16: qty 1

## 2019-11-16 MED ORDER — POTASSIUM CHLORIDE 10 MEQ/100ML IV SOLN
10.0000 meq | INTRAVENOUS | Status: AC
  Administered 2019-11-16 (×2): 10 meq via INTRAVENOUS
  Filled 2019-11-16 (×2): qty 100

## 2019-11-16 MED ORDER — POTASSIUM CHLORIDE CRYS ER 20 MEQ PO TBCR
40.0000 meq | EXTENDED_RELEASE_TABLET | Freq: Once | ORAL | Status: AC
  Administered 2019-11-16: 40 meq via ORAL
  Filled 2019-11-16: qty 2

## 2019-11-16 NOTE — Discharge Instructions (Addendum)
There were no detectable illicit drugs or alcohol in your system this visit.

## 2019-11-16 NOTE — ED Provider Notes (Signed)
[facility] Provider Note: Georgena Spurling, MD, FACEP  CSN: [id number] MRN: [id number] ARRIVAL: 11/15/19 at 2242 ROOM: [place]  Dizz[facility]   HISTORY OF PRESENT ILLNESS  11/16/19 4:13 AM Sheila Graves is a 39 y.o. female with a history of alcoholism currently receiving treatment.  She is here with lightheadedness, and nausea that began about 2 hours prior to arrival.  She had tried to be seen at [facility] but left without being seen.  Lab work had been done at [facility] with the exception of urinalysis.  She is concerned she may have been given a drug surreptitiously.  She was given Phenergan and IV fluids prior to my evaluation and is now feeling back to baseline (NB: Patient has a history of QT prolongation but her EKG was normal showing no QT prolongation currently).  She was also noted to be hypokalemic and was given potassium both IV and orally.  She denies any pain and her nausea has resolved.   Past Medical History:  Diagnosis Date  . Alcohol withdrawal seizure ([place])   . ETOH abuse   . Hypokalemia   . Pancreatitis   . Thyroid disease     Past Surgical History:  Procedure Laterality Date  . FINGER SURGERY    . IRRIGATION AND DEBRIDEMENT SHOULDER Right 11/12/2018   Procedure: Irrigation And Debridement Shoulder;  Surgeon: Shona Needles, MD;  Location: [facility];  Service: Orthopedics;  Laterality: Right;  . ORIF CLAVICULAR FRACTURE Right 11/12/2018   Procedure: OPEN REDUCTION INTERNAL FIXATION (ORIF) CLAVICULAR FRACTURE;  Surgeon: Shona Needles, MD;  Location: [facility];  Service: Orthopedics;  Laterality: Right;    Family History  Problem Relation Age of Onset  . Alcohol abuse Other     Social History   Tobacco Use  . Smoking status: Current Every Day Smoker    Packs/day: 1.00  . Smokeless tobacco: Never Used  Substance Use Topics  . Alcohol use: Yes  . Drug use: Not Currently    Types: Heroin    Prior to Admission medications     Medication Sig Start Date End Date Taking? Authorizing Provider  potassium chloride 20 MEQ TBCR Take 20 mEq by mouth daily for 5 days. 11/16/19 11/21/19  Darnesha Diloreto, MD  escitalopram (LEXAPRO) 10 MG tablet Take 1 tablet (10 mg total) by mouth daily. Patient not taking: Reported on 09/16/2019 03/22/19 11/16/19  Louellen Molder, MD    Allergies Patient has no known allergies.   REVIEW OF SYSTEMS  Negative except as noted here or in the History of Present Illness.   PHYSICAL EXAMINATION  Initial Vital Signs Blood pressure 116/72, pulse 98, temperature 98.3 F (36.8 C), temperature source Oral, resp. rate 15, height 5\' 11"  (1.803 m), weight 61.2 kg, last menstrual period 11/08/2019, SpO2 100 %.  Examination General: Well-developed, well-nourished female in no acute distress; appearance consistent with age of record HENT: normocephalic; atraumatic Eyes: pupils equal, round and reactive to light; extraocular muscles intact Neck: supple Heart: regular rate and rhythm Lungs: clear to auscultation bilaterally Abdomen: soft; nondistended; nontender; bowel sounds present Extremities: No deformity; full range of motion; pulses normal Neurologic: Awake, alert and oriented; motor function intact in all extremities and symmetric; no facial droop Skin: Warm and dry Psychiatric: Normal mood and affect   RESULTS  Summary of this visit's results, reviewed and interpreted by myself:   EKG Interpretation  Date/Time:    Ventricular Rate:    PR Interval:  QRS Duration:   QT Interval:    QTC Calculation:   R Axis:     Text Interpretation:        Laboratory Studies: Results for orders placed or performed during the hospital encounter of 11/16/19 (from the past 24 hour(s))  Ethanol     Status: None   Collection Time: 11/16/19 12:31 AM  Result Value Ref Range   Alcohol, Ethyl (B) <10 <10 mg/dL  Rapid urine drug screen (hospital performed)     Status: None   Collection Time: 11/16/19  12:31 AM  Result Value Ref Range   Opiates NONE DETECTED NONE DETECTED   Cocaine NONE DETECTED NONE DETECTED   Benzodiazepines NONE DETECTED NONE DETECTED   Amphetamines NONE DETECTED NONE DETECTED   Tetrahydrocannabinol NONE DETECTED NONE DETECTED   Barbiturates NONE DETECTED NONE DETECTED  Urinalysis, Routine w reflex microscopic     Status: Abnormal   Collection Time: 11/16/19 12:31 AM  Result Value Ref Range   Color, Urine STRAW (A) YELLOW   APPearance CLEAR CLEAR   Specific Gravity, Urine 1.015 1.005 - 1.030   pH 8.0 5.0 - 8.0   Glucose, UA NEGATIVE NEGATIVE mg/dL   Hgb urine dipstick NEGATIVE NEGATIVE   Bilirubin Urine NEGATIVE NEGATIVE   Ketones, ur NEGATIVE NEGATIVE mg/dL   Protein, ur NEGATIVE NEGATIVE mg/dL   Nitrite NEGATIVE NEGATIVE   Leukocytes,Ua NEGATIVE NEGATIVE   Imaging Studies: No results found.  ED COURSE and MDM  Nursing notes, initial and subsequent vitals signs, including pulse oximetry, reviewed and interpreted by myself.  Vitals:   11/16/19 0245 11/16/19 0330 11/16/19 0404 11/16/19 0405  BP: (!) 93/53 92/68 116/72   Pulse: 73 64  98  Resp: 14 16 (!) 24 15  Temp:      TempSrc:      SpO2: 98% 100%  100%  Weight:      Height:       Medications  sodium chloride 0.9 % bolus 1,000 mL (0 mLs Intravenous Stopped 11/16/19 0233)  promethazine (PHENERGAN) injection 12.5 mg (12.5 mg Intravenous Given 11/16/19 0042)  potassium chloride 10 mEq in 100 mL IVPB (0 mEq Intravenous Stopped 11/16/19 0233)  potassium chloride SA (KLOR-CON) CR tablet 40 mEq (40 mEq Oral Given 11/16/19 0041)   Patient feeling better as noted in HPI.  She would like a note stating she was negative for alcohol and drugs as she is currently in rehab.  PROCEDURES  Procedures   ED DIAGNOSES     ICD-10-CM   1. Nausea  R11.0   2. Lightheadedness  R42   3. Hypokalemia  E87.6        Tavonte Seybold, Jonny Ruiz, MD 11/16/19 [phone]

## 2019-12-06 ENCOUNTER — Emergency Department (HOSPITAL_COMMUNITY)
Admission: EM | Admit: 2019-12-06 | Discharge: 2019-12-07 | Disposition: A | Discharge status: Home / Self Care | Payer: Self-pay | Attending: Emergency Medicine | Admitting: Emergency Medicine

## 2019-12-06 ENCOUNTER — Other Ambulatory Visit: Payer: Self-pay

## 2019-12-06 ENCOUNTER — Encounter (HOSPITAL_COMMUNITY): Payer: Self-pay | Admitting: Emergency Medicine

## 2019-12-06 DIAGNOSIS — F17210 Nicotine dependence, cigarettes, uncomplicated: Secondary | ICD-10-CM | POA: Insufficient documentation

## 2019-12-06 DIAGNOSIS — E876 Hypokalemia: Secondary | ICD-10-CM | POA: Insufficient documentation

## 2019-12-06 DIAGNOSIS — Y908 Blood alcohol level of 240 mg/100 ml or more: Secondary | ICD-10-CM | POA: Insufficient documentation

## 2019-12-06 DIAGNOSIS — F10920 Alcohol use, unspecified with intoxication, uncomplicated: Secondary | ICD-10-CM

## 2019-12-06 DIAGNOSIS — F10129 Alcohol abuse with intoxication, unspecified: Secondary | ICD-10-CM | POA: Insufficient documentation

## 2019-12-06 LAB — COMPREHENSIVE METABOLIC PANEL
ALT: 20 U/L (ref 0–44)
AST: 28 U/L (ref 15–41)
Albumin: 4.7 g/dL (ref 3.5–5.0)
Alkaline Phosphatase: 61 U/L (ref 38–126)
Anion gap: 12 (ref 5–15)
BUN: 14 mg/dL (ref 6–20)
CO2: 33 mmol/L — ABNORMAL HIGH (ref 22–32)
Calcium: 8.7 mg/dL — ABNORMAL LOW (ref 8.9–10.3)
Chloride: 98 mmol/L (ref 98–111)
Creatinine, Ser: 0.84 mg/dL (ref 0.44–1.00)
GFR calc Af Amer: >60 mL/min (ref >60)
GFR calc non Af Amer: >60 mL/min (ref >60)
Glucose, Bld: 97 mg/dL (ref 70–99)
Potassium: 2.6 mmol/L — CL (ref 3.5–5.1)
Sodium: 143 mmol/L (ref 135–145)
Total Bilirubin: 0.7 mg/dL (ref 0.3–1.2)
Total Protein: 7.7 g/dL (ref 6.5–8.1)

## 2019-12-06 LAB — CBC
HCT: 38.4 % (ref 36.0–46.0)
Hemoglobin: 12.6 g/dL (ref 12.0–15.0)
MCH: 30.5 pg (ref 26.0–34.0)
MCHC: 32.8 g/dL (ref 30.0–36.0)
MCV: 93 fL (ref 80.0–100.0)
Platelets: 270 10*3/uL (ref 150–400)
RBC: 4.13 MIL/uL (ref 3.87–5.11)
RDW: 13.3 % (ref 11.5–15.5)
WBC: 5.1 10*3/uL (ref 4.0–10.5)
nRBC: 0 % (ref 0.0–0.2)

## 2019-12-06 LAB — ETHANOL: Alcohol, Ethyl (B): 413 mg/dL (ref <10)

## 2019-12-06 MED ORDER — SODIUM CHLORIDE 0.9 % IV BOLUS
1000.0000 mL | Freq: Once | INTRAVENOUS | Status: AC
  Administered 2019-12-06: 1000 mL via INTRAVENOUS

## 2019-12-06 MED ORDER — POTASSIUM CHLORIDE 10 MEQ/100ML IV SOLN
10.0000 meq | INTRAVENOUS | Status: AC
  Administered 2019-12-06 – 2019-12-07 (×3): 10 meq via INTRAVENOUS
  Filled 2019-12-06 (×3): qty 100

## 2019-12-06 MED ORDER — MAGNESIUM SULFATE 2 GM/50ML IV SOLN
2.0000 g | Freq: Once | INTRAVENOUS | Status: AC
  Administered 2019-12-06: 2 g via INTRAVENOUS
  Filled 2019-12-06: qty 50

## 2019-12-06 NOTE — ED Provider Notes (Signed)
[facility]-EMERGENCY DEPT Provider Note   CSN: [id number] Arrival date & time: 12/06/19  1944    History Chief Complaint  Patient presents with  . Alcohol Intoxication    Sheila Graves is a 39 y.o. female with past medical history significant for ETOH use who presents for evaluation of intoxication. Patient brought in by EMS from [employer]. Had been fighting with her significant other. States she wants alcohol detox. States she been drinking vodka. Unable to quantify. Denies illicit substances.  Does have history of withdrawal seizure.  Unable to tell me how much she drinks daily.  Level 5 caveat-intoxication    HPI     Past Medical History:  Diagnosis Date  . Alcohol withdrawal seizure ([facility])   . ETOH abuse   . Hypokalemia   . Pancreatitis   . Thyroid disease     Patient Active Problem List   Diagnosis Date Noted  . Hypophosphatemia 07/31/2019  . Alcohol use disorder, severe, dependence ([facility])   . Prolonged QT interval 03/16/2019  . Hypokalemia 02/04/2019  . 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 FINGER SURGERY    . IRRIGATION AND DEBRIDEMENT SHOULDER Right 11/12/2018   Procedure: Irrigation And Debridement Shoulder;  Surgeon: Roby Lofts, MD;  Location: [facility] OR;  Service: Orthopedics;  Laterality: Right;  . ORIF CLAVICULAR FRACTURE Right 11/12/2018   Procedure: OPEN REDUCTION INTERNAL FIXATION (ORIF) CLAVICULAR FRACTURE;  Surgeon: Roby Lofts, MD;  Location: [facility] OR;  Service: Orthopedics;  Laterality: Right;     OB History   No obstetric history on file.     Family History  Problem Relation Age of Onset  . Alcohol abuse Other     Social History   Tobacco Use  . Smoking status: Current Every Day Smoker    Packs/day: 1.00  . Smokeless tobacco: Never Used  Substance Use Topics  . Alcohol use: Yes  . Drug use: Not Currently     Types: Heroin    Home Medications Prior to Admission medications   Medication Sig Start Date End Date Taking? Authorizing Provider  potassium chloride 20 MEQ TBCR Take 20 mEq by mouth daily for 5 days. 11/16/19 11/21/19  Molpus, John, MD  escitalopram (LEXAPRO) 10 MG tablet Take 1 tablet (10 mg total) by mouth daily. Patient not taking: Reported on 09/16/2019 03/22/19 11/16/19  Eddie North, MD    Allergies    Patient has no known allergies.  Review of Systems   Review of Systems  Unable to perform ROS: Mental status change    Physical Exam Updated Vital Signs BP 95/67   Pulse 73   Temp 98.8 F (37.1 C) (Oral)   Resp 14   LMP 11/08/2019   SpO2 97%   Physical Exam Vitals and nursing note reviewed.  Constitutional:      Appearance: She is well-developed. She is not toxic-appearing or diaphoretic.     Comments: Sleepy, arousal to painful stimuli   HENT:     Head: Normocephalic and atraumatic.     Nose: Nose normal.     Mouth/Throat:     Mouth: Mucous membranes are moist.  Eyes:     Pupils: Pupils are equal, round, and reactive to light.  Cardiovascular:     Rate and Rhythm: Normal rate.     Pulses: Normal pulses.     Heart sounds: Normal heart sounds.  Pulmonary:  Effort: Pulmonary effort is normal. No respiratory distress.     Breath sounds: Normal breath sounds.  Abdominal:     General: Bowel sounds are normal. There is no distension.     Tenderness: There is no abdominal tenderness. There is no guarding or rebound.  Musculoskeletal:        General: Normal range of motion.     Cervical back: Normal range of motion.  Skin:    General: Skin is warm and dry.     Capillary Refill: Capillary refill takes less than 2 seconds.  Neurological:     GCS: GCS eye subscore is 3. GCS verbal subscore is 4. GCS motor subscore is 5.     Cranial Nerves: Cranial nerves are intact.     Comments: Moves all 4 extremities without difficulty. No facial droop, tongue midline.       ED Results / Procedures / Treatments   Labs (all labs ordered are listed, but only abnormal results are displayed) Labs Reviewed  COMPREHENSIVE METABOLIC PANEL - Abnormal; Notable for the following components:      Result Value   Potassium 2.6 (*)    CO2 33 (*)    Calcium 8.7 (*)    All other components within normal limits  ETHANOL - Abnormal; Notable for the following components:   Alcohol, Ethyl (B) 413 (*)    All other components within normal limits  CBC  RAPID URINE DRUG SCREEN, HOSP PERFORMED  HCG, QUANTITATIVE, PREGNANCY    EKG None  Radiology No results found.  Procedures Procedures (including critical care time)  Medications Ordered in ED Medications  potassium chloride 10 mEq in 100 mL IVPB (10 mEq Intravenous New Bag/Given 12/06/19 2334)  sodium chloride 0.9 % bolus 1,000 mL (has no administration in time range)  sodium chloride 0.9 % bolus 1,000 mL (0 mLs Intravenous Stopped 12/06/19 2237)  magnesium sulfate IVPB 2 g 50 mL (2 g Intravenous New Bag/Given 12/06/19 2254)    ED Course  I have reviewed the triage vital signs and the nursing notes.  Pertinent labs & imaging results that were available during my care of the patient were reviewed by me and considered in my medical decision making (see chart for details).  39 year old female presents for evaluation of alcohol intoxication.  Found in parking lot by [employer].  Apparently was having verbal altercation with her significant other and stated she wanted detox.  Admits to drinking heavily however she is not able to quantify this.  On arrival she is obviously intoxicated, slurred speech.  She denies any pain.  She moves all 4 extremities without difficulty.  She does look dehydrated.  Plan on labs, imaging and reassess: CBC without leukocytosis Metabolic panel with hypokalemia 2.6, bicarb 33, she does appear dehydrated.  We will add on potassium magnesium due to her hypokalemia and alcohol use Ethanol  413  Patient reassessed.  Sleeping however arousable to voice.  Still has slurred speech.  Likely from her alcohol use.  Low suspicion for acute intracranial abnormality and she has no evidence of traumatic injuries on self.  Care transferred to [place], PA-C at shift change.  We will plan to allow patient to sober and reassess.  She will determine disposition.  If patient able to tolerate p.o. intake, does not appear actively withdrawing requiring admission and is clinically sober plan to DC home.  She does have history of withdrawal seizures so would likely need to DC home with Librium taper if she has plans  for alcohol cessation.     MDM Rules/Calculators/A&P                       Final Clinical Impression(s) / ED Diagnoses Final diagnoses:  Alcoholic intoxication without complication ([place])  Hypokalemia    Rx / DC Orders ED Discharge Orders    None       Ronit Cranfield A, PA-C 12/07/19 0028    Maudie Flakes, MD 12/07/19 [phone]

## 2019-12-06 NOTE — ED Notes (Signed)
Date and time results received: 12/06/19 10:24 PM  (use smartphrase ".now" to insert current time)  Test: Potassium; Ethanol Critical Value: 2.6; 413  Name of Provider Notified: Britni H PA  Orders Received? Or Actions Taken?:

## 2019-12-06 NOTE — ED Triage Notes (Signed)
Patient coming from [employer] parking lot by [employer]. Patient was fighting with her girlfriend and wants to detox. She has been drinking vodka. She wants to get away from girlfriend.

## 2019-12-07 MED ORDER — POTASSIUM CHLORIDE CRYS ER 20 MEQ PO TBCR
40.0000 meq | EXTENDED_RELEASE_TABLET | Freq: Once | ORAL | Status: AC
  Administered 2019-12-07: 40 meq via ORAL
  Filled 2019-12-07: qty 2

## 2019-12-07 MED ORDER — SODIUM CHLORIDE 0.9 % IV BOLUS
1000.0000 mL | Freq: Once | INTRAVENOUS | Status: AC
  Administered 2019-12-07: 1000 mL via INTRAVENOUS

## 2019-12-07 MED ORDER — CHLORDIAZEPOXIDE HCL 25 MG PO CAPS
25.0000 mg | ORAL_CAPSULE | Freq: Once | ORAL | Status: AC
  Administered 2019-12-07: 25 mg via ORAL
  Filled 2019-12-07: qty 1

## 2019-12-07 MED ORDER — CHLORDIAZEPOXIDE HCL 25 MG PO CAPS
ORAL_CAPSULE | ORAL | 0 refills | Status: DC
Start: 2019-12-07 — End: 2020-02-23

## 2019-12-07 MED ORDER — POTASSIUM CHLORIDE CRYS ER 20 MEQ PO TBCR: 20.0000 meq | EXTENDED_RELEASE_TABLET | Freq: Two times a day (BID) | ORAL | 0 refills | Status: DC

## 2019-12-07 NOTE — ED Provider Notes (Signed)
39 year old female received at signout from [place] Henderly pending potassium and magnesium replenishment and metabolization of ethanol.  Per her HPI:  "Sheila Graves is a 39 y.o. female with past medical history significant for ETOH use who presents for evaluation of intoxication. Patient brought in by EMS from [employer]. Had been fighting with her significant other. States she wants alcohol detox. States she been drinking vodka. Unable to quantify. Denies illicit substances.  Does have history of withdrawal seizure.  Unable to tell me how much she drinks daily.  Level 5 caveat-intoxication"  Physical Exam  BP 99/71   Pulse 92   Temp 98.8 F (37.1 C) (Oral)   Resp (!) 26   LMP 11/08/2019   SpO2 100%   Physical Exam Vitals and nursing note reviewed.  Constitutional:      Appearance: She is well-developed.     Comments: Sleeping with equal, even respirations on arrival to the room, but she arouses easily to voice.  HENT:     Head: Normocephalic.  Musculoskeletal:        General: Normal range of motion.     Cervical back: Normal range of motion.  Neurological:     Mental Status: She is alert and oriented to person, place, and time.     Comments: No tremor noted.  Speech is not slurred.  Speaks in complete, fluent sentences with goal oriented responses.  Alert and oriented x3.  Follows simple commands.     ED Course/Procedures     Procedures  MDM   39 year old female received a signout from PA Henderly pending reevaluation.  Please see her note for further work-up and medical decision making.  She is well-known to this emergency department with 16 visits in the last 6 months.  03:20-Patient recheck.  IV potassium and magnesium are complete.  She is sleeping comfortably, but opens her eyes to voice.  She is not tremulous.  No tachycardia.  She is now alert and oriented x3.  She does not clinically appear intoxicated at this time.  She has no evidence of withdrawal.  Vital signs  are normal.  She does want to stop drinking alcohol.  Reports that she was able to stop drinking for some time since she was last seen in the ER 1 month ago.  She has been trying to find a substance use facility, but has been a challenge since she does not currently have medical insurance.  She is adamant that she has no SI, HI, or auditory visual hallucinations at this time.  Given that patient wants to discontinue alcohol use, will give first dose of Librium in the ER and plan to discharge the patient with a Librium taper.  We will also give 40 of oral potassium chloride in the ER and plan to discharge her with a short course of potassium chloride.  After the patient continued to be observed, she was able to tolerate fluids by mouth.  She appears clinically sober at this time and there is no evidence of acute alcohol withdrawal.  CIWA score is not elevated.  Will place peer support consult.  She has also been advised to follow-up with primary care for a recheck of her potassium since it was low today.  All questions answered.  ER return precautions given.  She is hemodynamically stable to no acute distress.  Safe for discharge home with outpatient follow-up as needed.    Frederik Pear A, PA-C 12/07/19 0828    Ward, Layla Maw, DO 12/07/19 2307

## 2019-12-07 NOTE — ED Notes (Signed)
Patient tolerated of water. But refused to get up.  Stated "give me 10 mins and I will get up".

## 2019-12-07 NOTE — ED Notes (Addendum)
Discharge paperwork reviewed with pt.  Pt with no questions at this time, pt reports she will contact wife for a ride.

## 2019-12-07 NOTE — ED Notes (Signed)
Pt ambulatory with steady gait out of ED

## 2019-12-07 NOTE — Discharge Instructions (Addendum)
Thank you for allowing me to care for you today in the Emergency Department.   Take Librium as prescribed usually do not drink alcohol or drive.  Your first dose was given in the ER.  Your potassium levels were low.  You were given both IV and oral potassium in the ER.  Take potassium as prescribed for the next 5 days.  Please follow-up with your primary care provider to have your potassium rechecked in the next week.  Return to the emergency department if you have a seizure, if you have uncontrollable vomiting, if you have severe chest pain, if you have thoughts of wanting to hurt or kill yourself, or other new, concerning symptoms.

## 2019-12-17 ENCOUNTER — Emergency Department (HOSPITAL_COMMUNITY): Admission: EM | Admit: 2019-12-17 | Discharge: 2019-12-17 | Discharge status: Against Medical Advice | Payer: Self-pay

## 2019-12-17 NOTE — ED Notes (Signed)
Pt checked back into ED requesting we send her to [facility], states her wife is a patient over there and that's where she wants to be taken to. She does not need medical care she thought EMS was taking her to [facility].  Pt escorted back off out of ED

## 2019-12-17 NOTE — ED Triage Notes (Signed)
On arrival pt requesting to call for a ride to a friends house. Pt then proceeded to pull her pants down and urinate in the hall way. Pt refused to stay and be triaged, security escorted from ED triage area.

## 2019-12-22 ENCOUNTER — Emergency Department (HOSPITAL_BASED_OUTPATIENT_CLINIC_OR_DEPARTMENT_OTHER)
Admission: EM | Admit: 2019-12-22 | Discharge: 2019-12-22 | Disposition: A | Discharge status: Home / Self Care | Payer: Self-pay | Attending: Emergency Medicine | Admitting: Emergency Medicine

## 2019-12-22 ENCOUNTER — Other Ambulatory Visit: Payer: Self-pay

## 2019-12-22 ENCOUNTER — Encounter (HOSPITAL_BASED_OUTPATIENT_CLINIC_OR_DEPARTMENT_OTHER): Payer: Self-pay | Admitting: Emergency Medicine

## 2019-12-22 DIAGNOSIS — R2243 Localized swelling, mass and lump, lower limb, bilateral: Secondary | ICD-10-CM | POA: Insufficient documentation

## 2019-12-22 DIAGNOSIS — Z5321 Procedure and treatment not carried out due to patient leaving prior to being seen by health care provider: Secondary | ICD-10-CM | POA: Insufficient documentation

## 2019-12-22 HISTORY — DX: Alcohol dependence, uncomplicated: F10.20

## 2019-12-22 NOTE — ED Notes (Signed)
Pt was not available to discharge vital signs.

## 2019-12-22 NOTE — ED Triage Notes (Addendum)
Swelling to both ankles and lower leg for the past week and a half.  Has new job with a lot of standing. No hx of leg swelling. Pt sts the feeling is that of a sprained ankle. Pt sts she is an alcoholic and that is "my biggest problem".  Drinks daily.  Would like long term rehab but can't afford it. Has been to [facility] and [facility].   Sts she can't afford her meds so she does not take any.  Sts she needs Potassium but can't afford it. Is tearful.

## 2019-12-22 NOTE — ED Provider Notes (Signed)
8:55 PM  MSE:  I was called to the waiting room because the patient is being agitated and verbally assaulting staff and yelling at other patients in the waiting room.  Patient is here for multiple complaints including bilateral leg edema which she reports has been worsening as she has had a new job where she is standing on her legs longer.  She is reporting pain in both her legs but denies any trauma.  She is denying any other physical complaints including no fevers, chills, chest pain, shortness of breath, cough.  She denies any chest or abdominal pain.  She denies other trauma.  She does report feeling tired.  She then started complaining about alcohol abuse and drinking.  She then started getting verbally aggressive with other staff.  I informed patient that if she can wear her mask for the protocols and wait calmly until she can be seen in exam room, we are happy to take care of her however if she continues to verbally assault staff, yell, and engage with other patients in the waiting room, she will need to be escorted off the premises.  On my initial exam, I do not see an emergent medical problem.  Her lungs were clear and chest was nontender.  Patient has mild edema both her legs but had palpable DP and PT pulses.  Patient was moving both of her legs in both of her arms.  No focal deficits seen.  Airway is intact and she is yelling at other staff.  If patient can follow protocols and wait calmly we will see her but if she continues to be agitated and aggressive with other patients or staff, she will be escorted off the property.      Charli Halle, Canary Brim, MD 12/22/19 [phone]

## 2019-12-22 NOTE — ED Notes (Signed)
Pt has decided not to stay and be seen. Pt left ED ambulatory.

## 2019-12-22 NOTE — ED Notes (Signed)
Pt began yelling in the lobby about her wait time. Pt escalated to verbally abusing greeter, registration, and security staff stating that she needs to be the one taken back yet. This RN overhead security to try to de-escalate pt and calmly asked her to comply with the mask policy and pt refused and continued to be belligerent. Dr. Rush Landmark came to lobby to provide an assessment and evaluate the patient for any emergency condition. Dr. Rush Landmark deemed the pt stable for discharge. Pt refused to leave the property after being discharge and security and [facility] escorted the pt from the property.

## 2019-12-22 NOTE — ED Notes (Signed)
Pt returned to ED and spoke with [facility]. Pt is apologetic for behavior and agrees to waiting in lobby without any further outburst per PD. Advised that pt would be allowed to return for further evaluation with agreement that any further incidence would lead to her being removed from the property.

## 2020-01-12 ENCOUNTER — Other Ambulatory Visit: Payer: Self-pay

## 2020-01-12 ENCOUNTER — Emergency Department (HOSPITAL_COMMUNITY)
Admission: EM | Admit: 2020-01-12 | Discharge: 2020-01-12 | Disposition: A | Discharge status: Home / Self Care | Payer: Self-pay | Attending: Emergency Medicine | Admitting: Emergency Medicine

## 2020-01-12 ENCOUNTER — Encounter (HOSPITAL_COMMUNITY): Payer: Self-pay

## 2020-01-12 DIAGNOSIS — R569 Unspecified convulsions: Secondary | ICD-10-CM | POA: Insufficient documentation

## 2020-01-12 DIAGNOSIS — Z5321 Procedure and treatment not carried out due to patient leaving prior to being seen by health care provider: Secondary | ICD-10-CM | POA: Insufficient documentation

## 2020-01-12 LAB — BASIC METABOLIC PANEL
Anion gap: 16 — ABNORMAL HIGH (ref 5–15)
BUN: 19 mg/dL (ref 6–20)
CO2: 27 mmol/L (ref 22–32)
Calcium: 9.3 mg/dL (ref 8.9–10.3)
Chloride: 93 mmol/L — ABNORMAL LOW (ref 98–111)
Creatinine, Ser: 0.95 mg/dL (ref 0.44–1.00)
GFR calc Af Amer: >60 mL/min (ref >60)
GFR calc non Af Amer: >60 mL/min (ref >60)
Glucose, Bld: 102 mg/dL — ABNORMAL HIGH (ref 70–99)
Potassium: 4.6 mmol/L (ref 3.5–5.1)
Sodium: 136 mmol/L (ref 135–145)

## 2020-01-12 LAB — I-STAT BETA HCG BLOOD, ED (MC, WL, AP ONLY): I-stat hCG, quantitative: <5 m[IU]/mL (ref <5)

## 2020-01-12 LAB — CBC
HCT: 40.6 % (ref 36.0–46.0)
Hemoglobin: 13.3 g/dL (ref 12.0–15.0)
MCH: 29.4 pg (ref 26.0–34.0)
MCHC: 32.8 g/dL (ref 30.0–36.0)
MCV: 89.6 fL (ref 80.0–100.0)
Platelets: 323 10*3/uL (ref 150–400)
RBC: 4.53 MIL/uL (ref 3.87–5.11)
RDW: 13.6 % (ref 11.5–15.5)
WBC: 5.9 10*3/uL (ref 4.0–10.5)
nRBC: 0 % (ref 0.0–0.2)

## 2020-01-12 NOTE — ED Notes (Signed)
I called patient in the lobby and outside to talk back to a room and no one responded.  Patient stickers was in the wheelchair the patient was sitting in however she was not there.

## 2020-01-12 NOTE — ED Triage Notes (Addendum)
Pt BIB EMS from home. Pt has hx of seizures, alcohol abuse, hypokalemia. 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 Pt states she had a seizure today unwitnessed. Pt A&Ox4 with EMS. Pt states her hands are "drawn up" due to her low potassium.   20G R FA 133/57

## 2020-01-12 NOTE — ED Notes (Signed)
Called 3X for room call. Eloped from waiting area.

## 2020-01-12 NOTE — ED Notes (Signed)
I called patient in the lobby and outside for a room and no one responded 

## 2020-01-17 ENCOUNTER — Encounter (HOSPITAL_COMMUNITY): Payer: Self-pay | Admitting: Emergency Medicine

## 2020-01-17 ENCOUNTER — Other Ambulatory Visit: Payer: Self-pay

## 2020-01-17 ENCOUNTER — Emergency Department (HOSPITAL_COMMUNITY)
Admission: EM | Admit: 2020-01-17 | Discharge: 2020-01-17 | Disposition: A | Discharge status: Home / Self Care | Payer: Self-pay | Attending: Emergency Medicine | Admitting: Emergency Medicine

## 2020-01-17 DIAGNOSIS — R45851 Suicidal ideations: Secondary | ICD-10-CM | POA: Insufficient documentation

## 2020-01-17 DIAGNOSIS — F17210 Nicotine dependence, cigarettes, uncomplicated: Secondary | ICD-10-CM | POA: Insufficient documentation

## 2020-01-17 DIAGNOSIS — F10920 Alcohol use, unspecified with intoxication, uncomplicated: Secondary | ICD-10-CM | POA: Insufficient documentation

## 2020-01-17 DIAGNOSIS — Z20822 Contact with and (suspected) exposure to covid-19: Secondary | ICD-10-CM | POA: Insufficient documentation

## 2020-01-17 LAB — URINALYSIS, ROUTINE W REFLEX MICROSCOPIC
Bilirubin Urine: NEGATIVE
Glucose, UA: NEGATIVE mg/dL
Hgb urine dipstick: NEGATIVE
Ketones, ur: NEGATIVE mg/dL
Leukocytes,Ua: NEGATIVE
Nitrite: NEGATIVE
Protein, ur: NEGATIVE mg/dL
Specific Gravity, Urine: 1.004 — ABNORMAL LOW (ref 1.005–1.030)
pH: 6 (ref 5.0–8.0)

## 2020-01-17 LAB — COMPREHENSIVE METABOLIC PANEL
ALT: 18 U/L (ref 0–44)
AST: 34 U/L (ref 15–41)
Albumin: 4.2 g/dL (ref 3.5–5.0)
Alkaline Phosphatase: 66 U/L (ref 38–126)
Anion gap: 16 — ABNORMAL HIGH (ref 5–15)
BUN: 15 mg/dL (ref 6–20)
CO2: 26 mmol/L (ref 22–32)
Calcium: 8.1 mg/dL — ABNORMAL LOW (ref 8.9–10.3)
Chloride: 103 mmol/L (ref 98–111)
Creatinine, Ser: 0.77 mg/dL (ref 0.44–1.00)
GFR calc Af Amer: >60 mL/min (ref >60)
GFR calc non Af Amer: >60 mL/min (ref >60)
Glucose, Bld: 78 mg/dL (ref 70–99)
Potassium: 3.7 mmol/L (ref 3.5–5.1)
Sodium: 145 mmol/L (ref 135–145)
Total Bilirubin: 0.4 mg/dL (ref 0.3–1.2)
Total Protein: 7.1 g/dL (ref 6.5–8.1)

## 2020-01-17 LAB — CBC WITH DIFFERENTIAL/PLATELET
Abs Immature Granulocytes: 0.02 10*3/uL (ref 0.00–0.07)
Basophils Absolute: 0.1 10*3/uL (ref 0.0–0.1)
Basophils Relative: 1 %
Eosinophils Absolute: 0 10*3/uL (ref 0.0–0.5)
Eosinophils Relative: 0 %
HCT: 42 % (ref 36.0–46.0)
Hemoglobin: 14 g/dL (ref 12.0–15.0)
Immature Granulocytes: 0 %
Lymphocytes Relative: 33 %
Lymphs Abs: 1.8 10*3/uL (ref 0.7–4.0)
MCH: 29.7 pg (ref 26.0–34.0)
MCHC: 33.3 g/dL (ref 30.0–36.0)
MCV: 89 fL (ref 80.0–100.0)
Monocytes Absolute: 0.3 10*3/uL (ref 0.1–1.0)
Monocytes Relative: 5 %
Neutro Abs: 3.3 10*3/uL (ref 1.7–7.7)
Neutrophils Relative %: 61 %
Platelets: 342 10*3/uL (ref 150–400)
RBC: 4.72 MIL/uL (ref 3.87–5.11)
RDW: 13.9 % (ref 11.5–15.5)
WBC: 5.4 10*3/uL (ref 4.0–10.5)
nRBC: 0 % (ref 0.0–0.2)

## 2020-01-17 LAB — RAPID URINE DRUG SCREEN, HOSP PERFORMED
Amphetamines: NOT DETECTED
Barbiturates: NOT DETECTED
Benzodiazepines: NOT DETECTED
Cocaine: NOT DETECTED
Opiates: NOT DETECTED
Tetrahydrocannabinol: NOT DETECTED

## 2020-01-17 LAB — SARS CORONAVIRUS 2 BY RT PCR (HOSPITAL ORDER, PERFORMED IN ~~LOC~~ HOSPITAL LAB): SARS Coronavirus 2: NEGATIVE

## 2020-01-17 LAB — PREGNANCY, URINE: Preg Test, Ur: NEGATIVE

## 2020-01-17 LAB — ETHANOL: Alcohol, Ethyl (B): 446 mg/dL (ref <10)

## 2020-01-17 LAB — SALICYLATE LEVEL: Salicylate Lvl: <7 mg/dL — ABNORMAL LOW (ref 7.0–30.0)

## 2020-01-17 LAB — ACETAMINOPHEN LEVEL: Acetaminophen (Tylenol), Serum: <10 ug/mL — ABNORMAL LOW (ref 10–30)

## 2020-01-17 LAB — I-STAT BETA HCG BLOOD, ED (MC, WL, AP ONLY): I-stat hCG, quantitative: <5 m[IU]/mL (ref <5)

## 2020-01-17 MED ORDER — LORAZEPAM 1 MG PO TABS: 0.0000 mg | ORAL_TABLET | Freq: Two times a day (BID) | ORAL | Status: DC

## 2020-01-17 MED ORDER — LORAZEPAM 1 MG PO TABS: 0.0000 mg | ORAL_TABLET | Freq: Four times a day (QID) | ORAL | Status: DC

## 2020-01-17 MED ORDER — LORAZEPAM 2 MG/ML IJ SOLN: 0.0000 mg | Freq: Two times a day (BID) | INTRAMUSCULAR | Status: DC

## 2020-01-17 MED ORDER — LORAZEPAM 2 MG/ML IJ SOLN: 0.0000 mg | Freq: Four times a day (QID) | INTRAMUSCULAR | Status: DC

## 2020-01-17 MED ORDER — THIAMINE HCL 100 MG PO TABS: 100.0000 mg | ORAL_TABLET | Freq: Every day | ORAL | Status: DC

## 2020-01-17 MED ORDER — THIAMINE HCL 100 MG/ML IJ SOLN: 100.0000 mg | Freq: Every day | INTRAMUSCULAR | Status: DC

## 2020-01-17 MED ORDER — SODIUM CHLORIDE 0.9 % IV BOLUS
1000.0000 mL | Freq: Once | INTRAVENOUS | Status: AC
  Administered 2020-01-17: 1000 mL via INTRAVENOUS

## 2020-01-17 NOTE — ED Notes (Signed)
Pt verbalizes understanding of DC instructions. Pt belongings returned and is ambulatory out of ED.  

## 2020-01-17 NOTE — ED Notes (Signed)
TTS tele machine at bedside

## 2020-01-17 NOTE — ED Provider Notes (Signed)
[facility]-EMERGENCY DEPT Provider Note   CSN: [id number] Arrival date & time: 01/17/20  1410     History Chief Complaint  Patient presents with  . Drug Overdose  . Suicidal    [facility] is a 39 y.o. female has no history of alcohol abuse, hypokalemia, pancreatitis brought in by EMS for evaluation of intentional overdose.  Per EMS wife reported that patient took eighteen 5 mg oxycodone, 5-12 Benadryl and up to 22 NyQuil.  Patient also reports that she has been drinking alcohol.  She cannot tell me exactly how much she drinks.  Wife states that patient verbalized suicidal ideations.  Patient does report positive SI but denies taking any pills and states that she was not trying to kill herself.  She can tell me that she drank alcohol but cannot time out much.  She does not remember taking any pills.  EM LEVEL 5 CAVEAT DUE TO INTOXICATION  The history is provided by the patient.       Past Medical History:  Diagnosis Date  . Alcohol withdrawal seizure (HCC)   . Alcoholic (HCC)   . ETOH abuse   . Hypokalemia   . Pancreatitis   . Thyroid disease     Patient Active Problem List   Diagnosis Date Noted  . Hypophosphatemia 07/31/2019  . Alcohol use disorder, severe, dependence (HCC)   . Prolonged QT interval 03/16/2019  . Hypokalemia 02/04/2019  . 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 FINGER SURGERY    . IRRIGATION AND DEBRIDEMENT SHOULDER Right 11/12/2018   Procedure: Irrigation And Debridement Shoulder;  Surgeon: Roby Lofts, MD;  Location: [facility] OR;  Service: Orthopedics;  Laterality: Right;  . ORIF CLAVICULAR FRACTURE Right 11/12/2018   Procedure: OPEN REDUCTION INTERNAL FIXATION (ORIF) CLAVICULAR FRACTURE;  Surgeon: Roby Lofts, MD;  Location: [facility] OR;  Service: Orthopedics;  Laterality: Right;     OB History   No obstetric history on  file.     Family History  Problem Relation Age of Onset  . Alcohol abuse Other     Social History   Tobacco Use  . Smoking status: Current Every Day Smoker    Packs/day: 1.00  . Smokeless tobacco: Never Used  Vaping Use  . Vaping Use: Never used  Substance Use Topics  . Alcohol use: Yes    Comment: drinks alcohol daily  . Drug use: Not Currently    Types: Heroin    Home Medications Prior to Admission medications   Medication Sig Start Date End Date Taking? Authorizing Provider  chlordiazePOXIDE (LIBRIUM) 25 MG capsule 50mg  PO TID x 1D, then 25-50mg  PO BID X 1D, then 25-50mg  PO QD X 1D Patient not taking: Reported on 01/17/2020 12/07/19   McDonald, Mia A, PA-C  potassium chloride SA (KLOR-CON) 20 MEQ tablet Take 1 tablet (20 mEq total) by mouth 2 (two) times daily for 5 days. 12/07/19 12/12/19  McDonald, Mia A, PA-C  escitalopram (LEXAPRO) 10 MG tablet Take 1 tablet (10 mg total) by mouth daily. Patient not taking: Reported on 09/16/2019 03/22/19 11/16/19  11/18/19, MD    Allergies    Patient has no known allergies.  Review of Systems   Review of Systems  Unable to perform ROS: Mental status change    Physical Exam Updated Vital Signs BP 115/88   Pulse 98   Temp 98.3 F (36.8 C) (Oral)  Resp 14   SpO2 100%   Physical Exam Vitals and nursing note reviewed.  Constitutional:      Appearance: She is well-developed and normal weight.     Comments: Appears intoxicated. Drowsy but arousable to verbal stimuli.  HENT:     Head: Normocephalic and atraumatic.  Eyes:     General: Lids are normal.     Conjunctiva/sclera: Conjunctivae normal.     Pupils: Pupils are equal, round, and reactive to light.     Comments: PERRL. EOMs intact. No nystagmus. No neglect.   Cardiovascular:     Rate and Rhythm: Normal rate and regular rhythm.     Pulses: Normal pulses.     Heart sounds: Normal heart sounds. No murmur heard.  No friction rub. No gallop.   Pulmonary:      Effort: Pulmonary effort is normal.     Breath sounds: Normal breath sounds.     Comments: Lungs clear to auscultation bilaterally.  Symmetric chest rise.  No wheezing, rales, rhonchi. Abdominal:     Palpations: Abdomen is soft. Abdomen is not rigid.     Tenderness: There is no abdominal tenderness. There is no guarding.     Comments: Abdomen is soft, non-distended, non-tender. No rigidity, No guarding. No peritoneal signs.  Musculoskeletal:        General: Normal range of motion.     Cervical back: Full passive range of motion without pain.  Skin:    General: Skin is warm and dry.     Capillary Refill: Capillary refill takes less than 2 seconds.  Neurological:     Mental Status: She is oriented to person, place, and time and easily aroused.     Comments: Alert and oriented x3. Slurred speech noted. Patient appears intoxicated. Able to follow commands. Moves all extremities spontaneously.  Psychiatric:        Speech: Speech normal.        Thought Content: Thought content includes suicidal ideation.     ED Results / Procedures / Treatments   Labs (all labs ordered are listed, but only abnormal results are displayed) Labs Reviewed  URINALYSIS, ROUTINE W REFLEX MICROSCOPIC - Abnormal; Notable for the following components:      Result Value   Color, Urine STRAW (*)    Specific Gravity, Urine 1.004 (*)    All other components within normal limits  COMPREHENSIVE METABOLIC PANEL - Abnormal; Notable for the following components:   Calcium 8.1 (*)    Anion gap 16 (*)    All other components within normal limits  ACETAMINOPHEN LEVEL - Abnormal; Notable for the following components:   Acetaminophen (Tylenol), Serum <10 (*)    All other components within normal limits  SALICYLATE LEVEL - Abnormal; Notable for the following components:   Salicylate Lvl <7.0 (*)    All other components within normal limits  ETHANOL - Abnormal; Notable for the following components:   Alcohol, Ethyl (B)  446 (*)    All other components within normal limits  SARS CORONAVIRUS 2 BY RT PCR (HOSPITAL ORDER, PERFORMED IN [facility])  PREGNANCY, URINE  RAPID URINE DRUG SCREEN, HOSP PERFORMED  CBC WITH DIFFERENTIAL/PLATELET  I-STAT BETA HCG BLOOD, ED ([facility], [facility], [facility] ONLY)    EKG EKG Interpretation  Date/Time:  Wednesday January 17 2020 14:25:19 EDT Ventricular Rate:  125 PR Interval:    QRS Duration: 92 QT Interval:  326 QTC Calculation: 471 R Axis:   62 Text Interpretation: Sinus tachycardia Otherwise normal  ecg Confirmed by Geoffery Lyons ([id number]) on 01/17/2020 3:20:32 PM   Radiology No results found.  Procedures Procedures (including critical care time)  Medications Ordered in ED Medications  LORazepam (ATIVAN) injection 0-4 mg (0 mg Intravenous Not Given 01/17/20 1946)    Or  LORazepam (ATIVAN) tablet 0-4 mg ( Oral See Alternative 01/17/20 1946)  LORazepam (ATIVAN) injection 0-4 mg (has no administration in time range)    Or  LORazepam (ATIVAN) tablet 0-4 mg (has no administration in time range)  thiamine tablet 100 mg (has no administration in time range)    Or  thiamine (B-1) injection 100 mg (has no administration in time range)  sodium chloride 0.9 % bolus 1,000 mL (0 mLs Intravenous Stopped 01/17/20 2051)    ED Course  I have reviewed the triage vital signs and the nursing notes.  Pertinent labs & imaging results that were available during my care of the patient were reviewed by me and considered in my medical decision making (see chart for details).    MDM Rules/Calculators/A&P                          39 year old female who presents via EMS for evaluation of intentional overdose. Patient reportedly took oxycodone, Benadryl, NyQuil per wife. Patient also endorses drinking alcohol but cannot tell me how much. Patient does report positive SI. She reports that she recently relapsed with alcohol and was upset about that. Additionally, she got in a fight with her wife  which is what prompted medications, alcohol use today. Initial arrival, she is afebrile, nontoxic-appearing. Vital signs are stable. She is slightly tachycardic. She is drowsy but easily arousable to verbal stimuli. Plan to check labs, call poison control, get TTS consultation.  I-stat beta is negative. UA shows no infectious etiology.  Ethanol level is 446.  Acetaminophen, salicylate levels negative.  UDS is negative for any opiates.  CMP shows normal BUN and creatinine.  She has an anion gap of 16.  Likely reflective of alcoholic ketoacidosis.  Will give fluids to rehydrate.  CBC shows no evidence of leukocytosis or anemia.  Discussed patient with Poison Control recommends observation until patient becomes sober. They recommend obtaining EKG, Tylenol, alcohol level as well as basic labs.  Reevaluation.  Patient is resting comfortably without any signs of distress.  She is easily arousable to verbal stimuli.  Discussed with Neysa Bonito (poison control).  I discussed with poison control regarding results.  From their standpoint, patient is medically cleared.  Patient is medically cleared and pending TTS consultation.  [facility] counselor evaluated patient.  At that time, patient did not have any suicidal ideations and denied taking any pills.  Patient had told behavioral health that her wife lies.  She is concerned about her relapse and alcohol intoxication and is interested in getting a rehab/detox facility.  She denied suicidal ideation multiple times during the behavioral health evaluation.  The recommendation was overnight observation during alcohol withdrawal with reevaluation in the morning when she was more sober.  RN inform me that patient was wanting to leave.  I went discussed with patient.  Patient is speaking clearly and without any signs of slurred speech or intoxication.  She can answer all my questions appropriately and is alert and oriented x3.  Patient denies any SI, HI, auditory or  visual hallucinations.  She denies taking any pills today.  Her UDS is negative today which seems to support that.  She only endorses drinking  alcohol today which she states is a chronic problem for her.  She states that she did not drink in order to try and kill herself.  She states that she does not have any thoughts of wanting to hurt or kill herself and has no suicidal plan.  Patient does appear clinically sober.  I did offer patient overnight observation with reassessment with behavioral health in the morning.  Patient denied wanting to do that.  She states that she would like to go home so that she can get into a rehab facility to stop her drinking.  She does not have any concerns of trauma or abuse at home.  At this time, her work-up is reassuring.  I do not see any evidence that she ingested any pills.  There was report that she had injected oxycodone but her UDS is negative for any opiates.  Additionally, she did appear intoxicated during my initial evaluation but otherwise was responding and did not require any Narcan.  Patient appears clinically sober.  She does not want to hurt herself or anybody else.  At this time, do not feel that patient needs IVC as do not feel that she is at harm to herself or any one else.  Patient was given resources for rehab/detox.  I again offered patient overnight observation but patient declined.  She appears clinically sober.  She is able to make full medical decision-making capacity. At this time, patient exhibits no emergent life-threatening condition that require further evaluation in ED. Patient had ample opportunity for questions and discussion. All patient's questions were answered with full understanding. Strict return precautions discussed. Patient expresses understanding and agreement to plan.    Portions of this note were generated with Scientist, clinical (histocompatibility and immunogenetics). Dictation errors may occur despite best attempts at proofreading.   Final Clinical Impression(s)  / ED Diagnoses Final diagnoses:  Alcoholic intoxication without complication [facility])    Rx / DC Orders ED Discharge Orders    None       Rosana Hoes 01/17/20 2155    Geoffery Lyons, MD 01/19/20 [phone]

## 2020-01-17 NOTE — ED Triage Notes (Signed)
20 L AC

## 2020-01-17 NOTE — ED Triage Notes (Signed)
BIBA  Per EMS: Pt coming from home  Pt complaining of SI  Wife states pt took 62 5mg  323mg  acetaminophen , 5-12 benadryl , and Up to 22 Nyquil  Pt has not needed narcan  Sats have been between 99-100% on EMS Pt intoxicated and admits to taking medication but not how much

## 2020-01-17 NOTE — BH Assessment (Signed)
Comprehensive Clinical Assessment (CCA) Note  01/17/2020 [facility] [id number]  Visit Diagnosis:   F10.22 Alcohol Dependence; F33.2 MDD, recurrent, severe without psychosis Disposition: [facility] recommends overnight observation during alcohol withdrawal. Peer Support has been consulted and likely available in the morning.  [facility] is a 39 yo married female who presents voluntarily to [facility] via EMS with a BAL of 446.  Pt denies she took any pills in an attempt to end her life. She reports her wife "lies" and made up the report of oxycodone and other pills. This is supported by pt's tox screen being negative. Pt admits feeling depressed but denies current suicidal ideation. Pt denies plan for suicide and she denies past suicide attempts. Pt has a history of alcohol abuse. She reports once she started drinking alcohol, she has virtually never stopped, aside from time spent in about 7 rehab tx programs. Pt is tearful speaking about how she knows the physical damage alcohol is doing to her body.   Pt reports she was recently considering going to [facility] at [facility], but was disappointed and unable to join when she was told she would not be able to speak with her wife for a year. Pt denies medication, aside from potassium. Pt denies auditory & visual hallucinations other symptoms of psychosis. Pt states current stressors include conflict with her parents and financial.   Pt lives with her wife. Pt denies hx of abuse and trauma. Pt has partial insight and judgment. Pt's memory is intact.   Protective factors against suicide include no current suicidal ideation, future orientation,  no access to firearms, no current psychotic symptoms and no prior attempts.?  IP history includes multiple. Last admission [facility] at [facility]. Pt reports alcohol abuse. She denies all other drug abuse. ? MSE: Pt is dressed,in a [facility] gown, alert, oriented x5 with normal speech and normal motor behavior. Eye  contact is good. Pt's mood is depressed and affect is depressed and anxious. Affect is congruent with mood. Thought process is coherent and relevant. There is no indication pt is currently responding to internal stimuli or experiencing delusional thought content. Pt was cooperative throughout assessment.    Visit Diagnosis:   F10.22 Alcohol Dependence; F33.2 MDD, recurrent, severe without psychosis Disposition: [facility] recommends overnight observation during alcohol withdrawal. Peer Support has been consulted and likely available in the morning.   CCA Screening, Triage and Referral (STR)  Patient Reported Information How did you hear about [place]? Self  Whom do you see for routine medical problems? [facility];Primary Care  Practice/Facility Name: [facility]  Prescriber Address (if known): off of [address] [place]   What Is the Reason for Your Visit/Call Today? Depression  How Long Has This Been Causing You Problems? > than 6 months (10 years)  What Do You Feel Would Help You the Most Today? Other (Comment) (help getting long term substance abuse tx)   Have You Recently Been in Any Inpatient Treatment ([facility]/Detox/Crisis Center/28-Day Program)? No (last was [facility] 10[facility]01/19)   Have You Ever Received Services From [facility] Before? Yes  Who Do You [facility] at [facility]? ED   Have You Recently Had Any Thoughts About Hurting Yourself? No  Are You Planning to Commit Suicide/Harm Yourself At This time? No   Have you Recently Had Thoughts About Hurting Someone [facility]? No  Explanation: No data recorded  Have You Used Any Alcohol or Drugs in the Past 24 Hours? Yes  How Long Ago Did  You Use Drugs or Alcohol? 1200  What Did You Use and How Much? 4 mini-bottles of wine   Do You Currently Have a Therapist/Psychiatrist? Yes  Name of Therapist/Psychiatrist: no   Have You Been Recently Discharged From Any Office Practice or Programs? No    CCA Screening Triage  Referral Assessment Type of Contact: Tele-Assessment  Is this Initial or Reassessment? Initial Assessment  Date Telepsych consult ordered in [facility]:  01/17/20  Patient Reported Information Reviewed? No  Patient Left Without Being Seen? No  Reason for Not Completing Assessment: Pt too inebriated & sleepy to assess   Collateral Involvement: not at this time   Does Patient Have a Court Appointed Legal Guardian? No data recorded Name and Contact of Legal Guardian: Self  If Minor and Not Living with Parent(s), Who has Custody? N/A  Is CPS involved or ever been involved? Never  Is APS involved or ever been involved? Never   Patient Determined To Be At Risk for Harm To Self or Others Based on Review of Patient Reported Information or Presenting Complaint? No   Location of Assessment: [facility]   Does Patient Present under Involuntary Commitment? No   [place] of Residence: [place]    Determination of Need: Urgent (48 hours)   Options For Referral: Outpatient Therapy;Chemical Dependency Intensive Outpatient Therapy (CDIOP)     CCA Biopsychosocial  Intake/Chief Complaint:  CCA Intake With Chief Complaint CCA Part Two Date: 01/17/20 CCA Part Two Time: 1607 Chief Complaint/Presenting Problem: Depression; alcohol abuse Patient's Currently Reported Symptoms/Problems: depression, daily alcohol abuse Individual's Strengths: wants to live, wants to stop drinking Type of Services Patient Feels Are Needed: pt wants rehab  Mental Health Symptoms Depression:  Depression: Change in energy/activity, Fatigue, Difficulty Concentrating, Irritability, Increase/decrease in appetite, Sleep (too much or little), Weight gain/loss, Tearfulness, Duration of symptoms greater than two weeks, Worthlessness  Mania:  Mania: None  Anxiety:   Anxiety: Difficulty concentrating, Worrying, Restlessness, Tension, Irritability, Sleep, Fatigue  Psychosis:  Psychosis: None  Trauma:  Trauma: None   Obsessions:  Obsessions: None  Compulsions:  Compulsions: None  Inattention:  Inattention: None  Hyperactivity/Impulsivity:  Hyperactivity/Impulsivity: N/A  Oppositional/Defiant Behaviors:  Oppositional/Defiant Behaviors: None  Emotional Irregularity:  Emotional Irregularity: None  Other Mood/Personality Symptoms:      Mental Status Exam Appearance and self-care  Stature:  Stature: Average  Weight:  Weight: Average weight  Clothing:  Clothing:  ([facility] gown)  Grooming:  Grooming: Neglected  Cosmetic use:  Cosmetic Use: None  Posture/gait:  Posture/Gait: Slumped  Motor activity:  Motor Activity: Not Remarkable  Sensorium  Attention:  Attention: Normal  Concentration:  Concentration: Normal  Orientation:  Orientation: X5  Recall/memory:  Recall/Memory: Normal  Affect and Mood  Affect:  Affect: Restricted, Tearful  Mood:  Mood: Anxious  Relating  Eye contact:  Eye Contact: Normal  Facial expression:  Facial Expression: Sad, Responsive, Tense  Attitude toward examiner:  Attitude Toward Examiner: Cooperative  Thought and Language  Speech flow: Speech Flow: Normal  Thought content:  Thought Content: Appropriate to Mood and Circumstances  Preoccupation:     Hallucinations:     Organization:     Company secretary of Knowledge:     Intelligence:     Abstraction:     Judgement:     Reality Testing:     Insight:     Decision Making:     Social Functioning  Social Maturity:     Social Judgement:     Stress  Stressors:     Coping Ability:     Skill Deficits:     Supports:        Religion: Religion/Spirituality Are You A Religious Person?:  (used to be) What is Your Religious Affiliation?: Copy: Leisure / Recreation Do You Have Hobbies?: Yes Leisure and Hobbies: play basketball  Exercise/Diet: Exercise/Diet Do You Exercise?: No Have You Gained or Lost A Significant Amount of Weight in the Past Six Months?: Yes-Lost Number of  Pounds Lost?: 10 Do You Follow a Special Diet?: No Do You Have Any Trouble Sleeping?: Yes Explanation of Sleeping Difficulties: can't stay asleep   CCA Employment/Education  Employment/Work Situation: Employment / Work Situation Employment situation: Employed Patient's job has been impacted by current illness: No What is the longest time patient has a held a job?: 11 months--"the travel got to be too much."  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 neither parent had any substance use or mental health issues. Description of patient's relationship with caregiver when they were a child: not close to parents How were you disciplined when you got in trouble as a child/adolescent?: grounded or take away priveledges Did patient suffer any verbal/emotional/physical/sexual abuse as a child?: No Did patient suffer from severe childhood neglect?: No Has patient ever been sexually abused/assaulted/raped as an adolescent or adult?: No Was the patient ever a victim of a crime or a disaster?: No Witnessed domestic violence?: No Has patient been affected by domestic violence as an adult?: No      CCA Substance Use  Alcohol/Drug Use: Alcohol / Drug Use Pain Medications: None Prescriptions: Potassium Over the Counter: None History of alcohol / drug use?: Yes Longest period of sobriety (when/how long): none known Negative Consequences of Use: Financial, Legal, Personal relationships, Work /  School Withdrawal Symptoms: Patient aware of relationship between substance abuse and physical/medical complications, Nausea / Vomiting, Fever / Chills, Irritability, Weakness, Seizures, Tremors Onset of Seizures: Withdrawal Date of most recent seizure: "A couple years ago" Substance #1 Name of Substance 1: liquor- vodka 1 - Amount (size/oz): 1/2 bottle 1 - Frequency: daily 1 - Duration: years 1 - Last Use / Amount: today                  DSM5 Diagnoses: Patient Active Problem List   Diagnosis Date Noted  . Hypophosphatemia 07/31/2019  . Alcohol use disorder, severe, dependence (HCC)   . Prolonged QT interval 03/16/2019  . Hypokalemia 02/04/2019  . Major depressive disorder, recurrent, severe without psychotic features (HCC) 01/31/2018     Nizar Cutler Suzan Nailer

## 2020-01-17 NOTE — Discharge Instructions (Signed)
As we discussed, I provided you resources to help with counseling, alcohol intoxication.  Return the emergency department for any thoughts wanting to hurt or kill yourself, thoughts of wanting her to kill anybody else, chest pain, difficulty breathing, vomiting or any other worsening concerning symptoms.

## 2020-02-07 IMAGING — CT CT HEAD W/O CM
3 of 4 series · 15 of 47 positions shown, 18 images · non-contrast
Comparison: June 06, 2019

CLINICAL DATA: Un witnessed fall.

EXAM:
CT HEAD WITHOUT CONTRAST
TECHNIQUE: Contiguous axial images were obtained from the base of the skull
through the vertex without intravenous contrast.

[Series 3: head wo · axial · 0.43mm/px · z∈[-110,+15]mm · 9 of 33 slices shown, 12 images]
[im 4/33  brain]
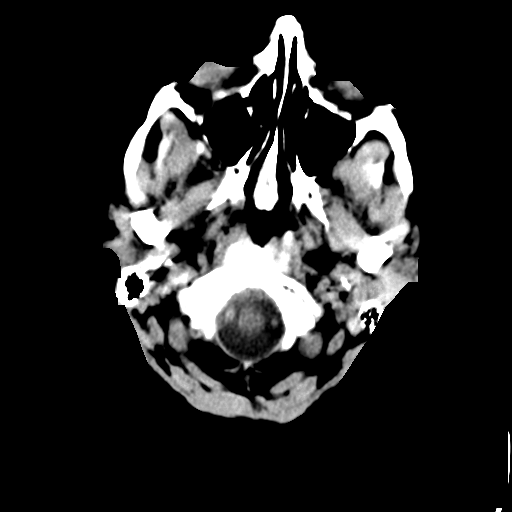
[im 4/33  bone]
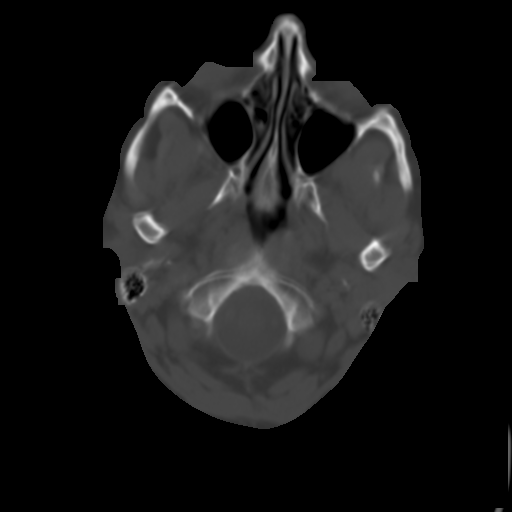
[im 7/33  brain]
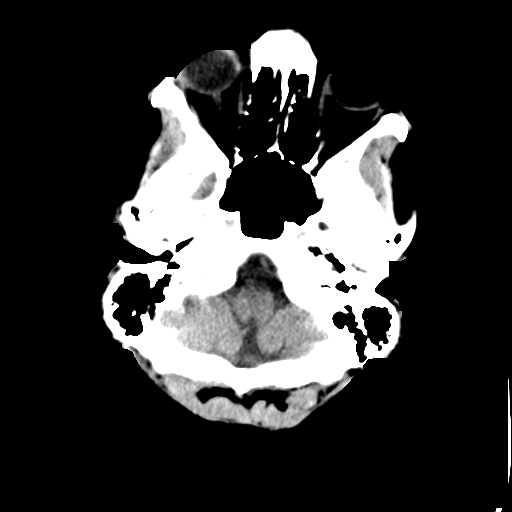
[im 10/33  brain]
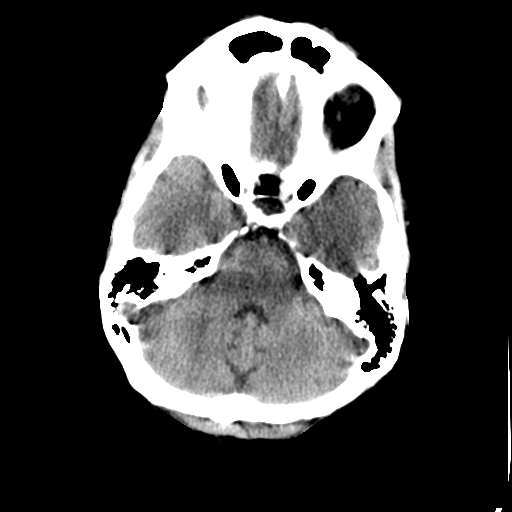
[im 13/33  brain]
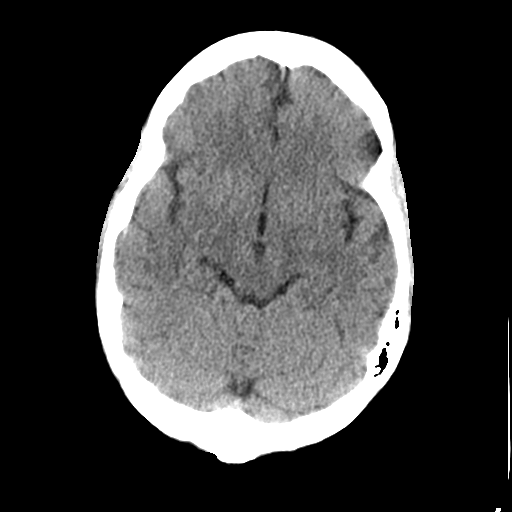
[im 17/33  brain]
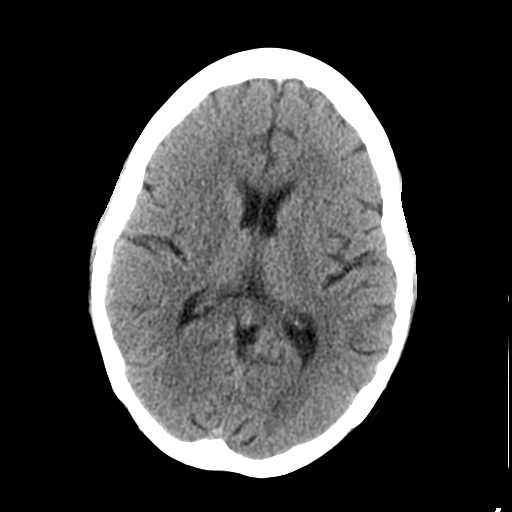
[im 17/33  bone]
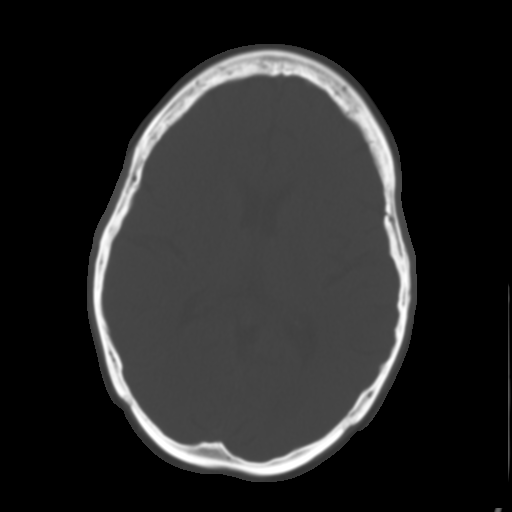
[im 20/33  brain]
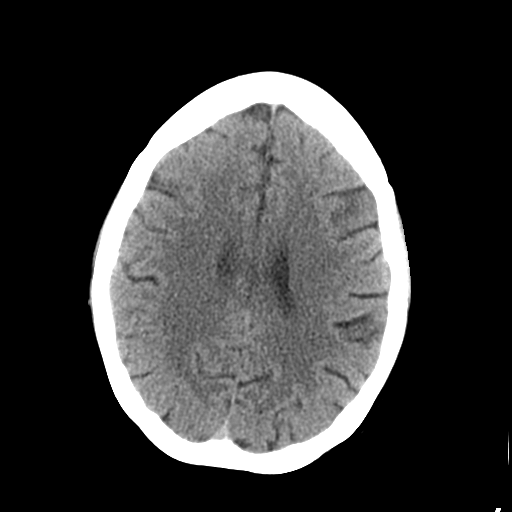
[im 23/33  brain]
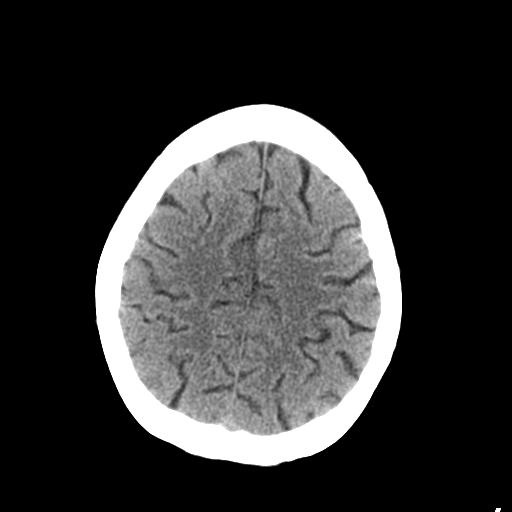
[im 26/33  brain]
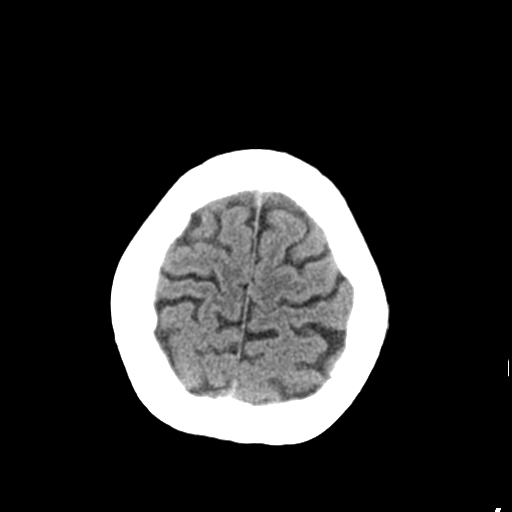
[im 29/33  brain]
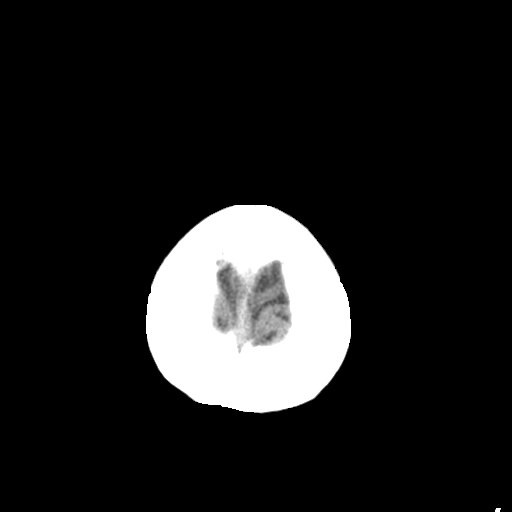
[im 29/33  bone]
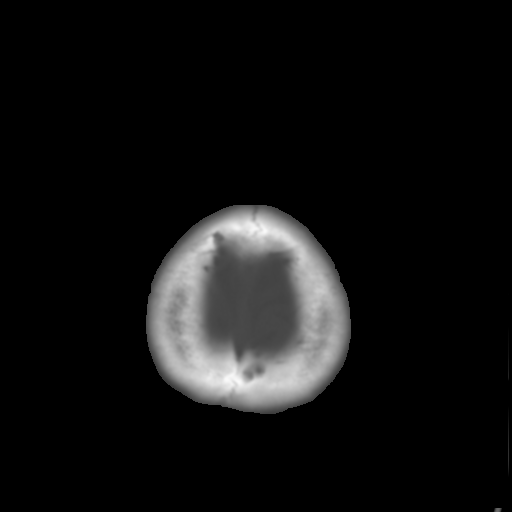

[Series 6: coronal soft tissue · coronal · 0.31mm/px · 3 of 67 slices shown]
[im 23/67  brain]
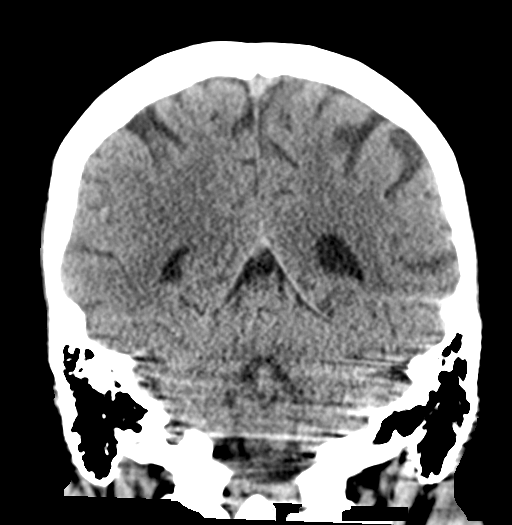
[im 30/67  brain]
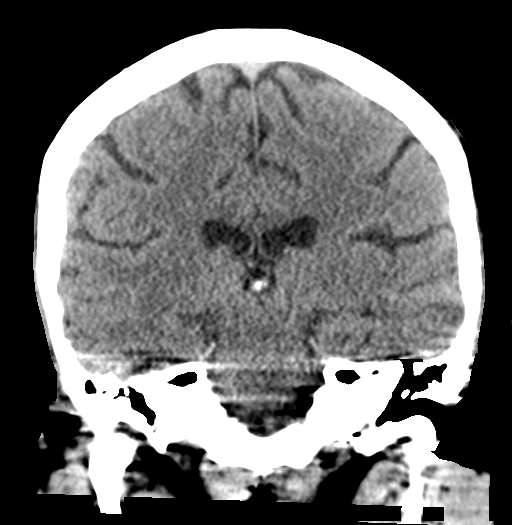
[im 37/67  brain]
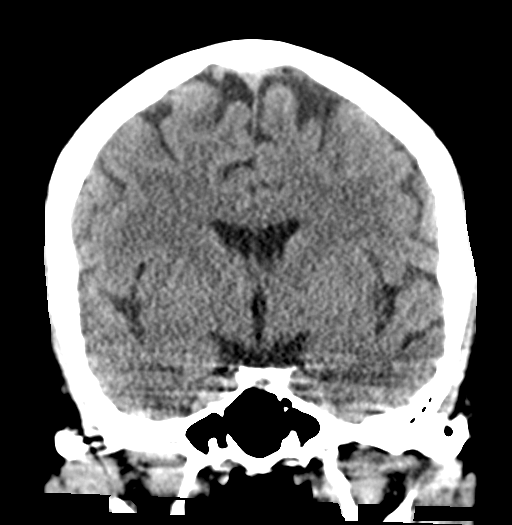

[Series 7: sagittal soft tissue · sagittal · 0.31mm/px · 3 of 50 slices shown]
[im 17/50  brain]
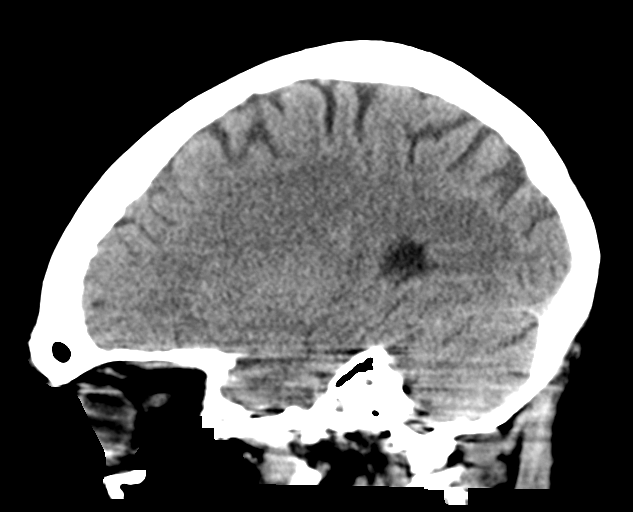
[im 25/50  brain]
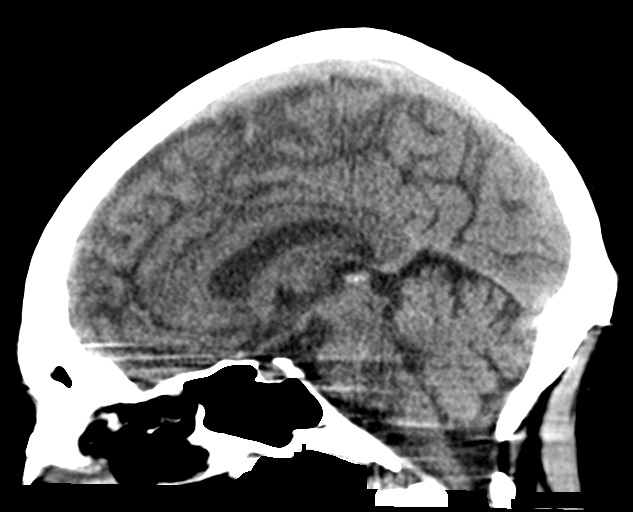
[im 33/50  brain]
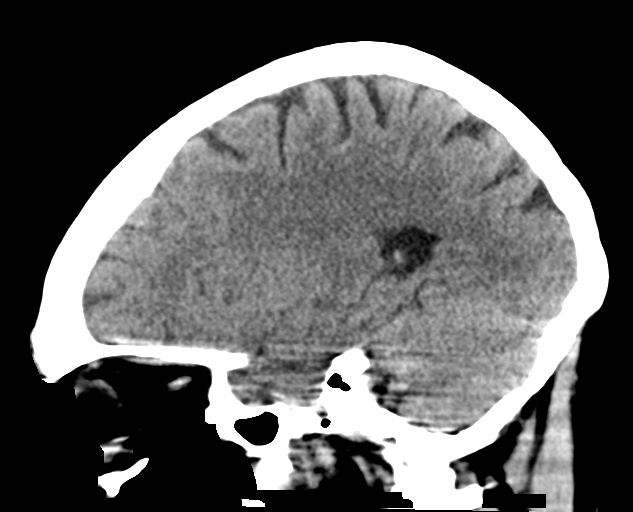

[15 of 47 positions shown; findings below may reference images not displayed]

FINDINGS: Brain: No evidence of acute infarction, hemorrhage, hydrocephalus,
extra-axial collection or mass lesion/mass effect.

Vascular: No hyperdense vessel or unexpected calcification.

Skull: Normal. Negative for fracture or focal lesion.

Sinuses/Orbits: No acute finding.

Other: None.
IMPRESSION: No acute intracranial pathology.

## 2020-02-23 ENCOUNTER — Encounter (HOSPITAL_BASED_OUTPATIENT_CLINIC_OR_DEPARTMENT_OTHER): Payer: Self-pay | Admitting: *Deleted

## 2020-02-23 ENCOUNTER — Emergency Department (HOSPITAL_BASED_OUTPATIENT_CLINIC_OR_DEPARTMENT_OTHER)
Admission: EM | Admit: 2020-02-23 | Discharge: 2020-02-23 | Disposition: A | Discharge status: Home / Self Care | Payer: Self-pay | Attending: Emergency Medicine | Admitting: Emergency Medicine

## 2020-02-23 ENCOUNTER — Other Ambulatory Visit: Payer: Self-pay

## 2020-02-23 DIAGNOSIS — E876 Hypokalemia: Secondary | ICD-10-CM | POA: Insufficient documentation

## 2020-02-23 DIAGNOSIS — F17200 Nicotine dependence, unspecified, uncomplicated: Secondary | ICD-10-CM | POA: Insufficient documentation

## 2020-02-23 DIAGNOSIS — R42 Dizziness and giddiness: Secondary | ICD-10-CM | POA: Insufficient documentation

## 2020-02-23 DIAGNOSIS — M791 Myalgia, unspecified site: Secondary | ICD-10-CM | POA: Insufficient documentation

## 2020-02-23 LAB — CBC WITH DIFFERENTIAL/PLATELET
Abs Immature Granulocytes: 0.01 10*3/uL (ref 0.00–0.07)
Basophils Absolute: 0 10*3/uL (ref 0.0–0.1)
Basophils Relative: 1 %
Eosinophils Absolute: 0.3 10*3/uL (ref 0.0–0.5)
Eosinophils Relative: 6 %
HCT: 46.8 % — ABNORMAL HIGH (ref 36.0–46.0)
Hemoglobin: 15.6 g/dL — ABNORMAL HIGH (ref 12.0–15.0)
Immature Granulocytes: 0 %
Lymphocytes Relative: 35 %
Lymphs Abs: 1.8 10*3/uL (ref 0.7–4.0)
MCH: 28.5 pg (ref 26.0–34.0)
MCHC: 33.3 g/dL (ref 30.0–36.0)
MCV: 85.4 fL (ref 80.0–100.0)
Monocytes Absolute: 0.6 10*3/uL (ref 0.1–1.0)
Monocytes Relative: 12 %
Neutro Abs: 2.4 10*3/uL (ref 1.7–7.7)
Neutrophils Relative %: 46 %
Platelets: 257 10*3/uL (ref 150–400)
RBC: 5.48 MIL/uL — ABNORMAL HIGH (ref 3.87–5.11)
RDW: 14.2 % (ref 11.5–15.5)
WBC: 5.1 10*3/uL (ref 4.0–10.5)
nRBC: 0 % (ref 0.0–0.2)

## 2020-02-23 LAB — BASIC METABOLIC PANEL
Anion gap: 11 (ref 5–15)
BUN: 17 mg/dL (ref 6–20)
CO2: 35 mmol/L — ABNORMAL HIGH (ref 22–32)
Calcium: 8.5 mg/dL — ABNORMAL LOW (ref 8.9–10.3)
Chloride: 87 mmol/L — ABNORMAL LOW (ref 98–111)
Creatinine, Ser: 0.95 mg/dL (ref 0.44–1.00)
GFR calc Af Amer: >60 mL/min (ref >60)
GFR calc non Af Amer: >60 mL/min (ref >60)
Glucose, Bld: 105 mg/dL — ABNORMAL HIGH (ref 70–99)
Potassium: 3 mmol/L — ABNORMAL LOW (ref 3.5–5.1)
Sodium: 133 mmol/L — ABNORMAL LOW (ref 135–145)

## 2020-02-23 LAB — COMPREHENSIVE METABOLIC PANEL
ALT: 22 U/L (ref 0–44)
AST: 36 U/L (ref 15–41)
Albumin: 5.4 g/dL — ABNORMAL HIGH (ref 3.5–5.0)
Alkaline Phosphatase: 74 U/L (ref 38–126)
Anion gap: 17 — ABNORMAL HIGH (ref 5–15)
BUN: 20 mg/dL (ref 6–20)
CO2: 38 mmol/L — ABNORMAL HIGH (ref 22–32)
Calcium: 10.4 mg/dL — ABNORMAL HIGH (ref 8.9–10.3)
Chloride: 79 mmol/L — ABNORMAL LOW (ref 98–111)
Creatinine, Ser: 1.12 mg/dL — ABNORMAL HIGH (ref 0.44–1.00)
GFR calc Af Amer: >60 mL/min (ref >60)
GFR calc non Af Amer: >60 mL/min (ref >60)
Glucose, Bld: 96 mg/dL (ref 70–99)
Potassium: 2.6 mmol/L — CL (ref 3.5–5.1)
Sodium: 134 mmol/L — ABNORMAL LOW (ref 135–145)
Total Bilirubin: 0.9 mg/dL (ref 0.3–1.2)
Total Protein: 8.8 g/dL — ABNORMAL HIGH (ref 6.5–8.1)

## 2020-02-23 LAB — MAGNESIUM: Magnesium: 2.3 mg/dL (ref 1.7–2.4)

## 2020-02-23 MED ORDER — POTASSIUM CHLORIDE CRYS ER 20 MEQ PO TBCR
60.0000 meq | EXTENDED_RELEASE_TABLET | Freq: Once | ORAL | Status: AC
  Administered 2020-02-23: 60 meq via ORAL
  Filled 2020-02-23: qty 3

## 2020-02-23 MED ORDER — POTASSIUM CHLORIDE 10 MEQ/100ML IV SOLN
10.0000 meq | INTRAVENOUS | Status: AC
  Administered 2020-02-23 (×3): 10 meq via INTRAVENOUS
  Filled 2020-02-23 (×3): qty 100

## 2020-02-23 MED ORDER — SODIUM CHLORIDE 0.9 % IV BOLUS
1000.0000 mL | Freq: Once | INTRAVENOUS | Status: AC
  Administered 2020-02-23: 1000 mL via INTRAVENOUS

## 2020-02-23 MED ORDER — SODIUM CHLORIDE 0.9 % IV SOLN: Freq: Once | INTRAVENOUS | Status: AC

## 2020-02-23 NOTE — ED Notes (Signed)
Lab informed of magnesium add on.

## 2020-02-23 NOTE — ED Triage Notes (Signed)
Sent here for low K+ 2.0, pt is [facility]

## 2020-02-23 NOTE — Discharge Instructions (Signed)
Your potassium was 2.6 on arrival, this improved to 3.0 with administration of oral and IV potassium.  You are also given IV fluids.  Please return to the emergency department if your symptoms become worse.  Continue oral potassium supplementation twice a day as previously prescribed.  Continue good oral intake.  Please have your potassium level rechecked again in 1 week.

## 2020-02-23 NOTE — ED Provider Notes (Signed)
[facility] EMERGENCY DEPARTMENT Provider Note   CSN: [id number] Arrival date & time: 02/23/20  1438     History Chief Complaint  Patient presents with   Abnormal lab    Sheila Graves is a 39 y.o. female.  Patient with history of alcohol abuse, hypokalemia thought related to poor oral intake presents the emergency department today for low potassium.  [facility]  She is planning to go to a residential facility/work program in the next week or 2.  Patient typically will know that her potassium was low when she starts having muscle spasm/drawing up of her fingers and wrists.  She had a physical several days ago and her potassium was 2.4.  She was started on twice daily potassium supplementation.  Today she felt lightheaded without full syncope.  This is exacerbated by standing for a few minutes.  She went back to urgent care and had blood work rechecked.  Her potassium was reportedly 2.0 at that time and she was referred to the emergency department.  Patient denies chest pain, shortness of breath, abdominal pain.  She states that she has been eating and drinking better, but not back to normal.  She denies vomiting or diarrhea.  She is not on any blood pressure medications.  The onset of this condition was acute. The course is constant. Aggravating factors: none. Alleviating factors: none.          Past Medical History:  Diagnosis Date   Alcohol withdrawal seizure (HCC)    Alcoholic (HCC)    ETOH abuse    Hypokalemia    Pancreatitis    Thyroid disease     Patient Active Problem List   Diagnosis Date Noted   Hypophosphatemia 07/31/2019   Alcohol use disorder, severe, dependence (HCC)    Prolonged QT interval 03/16/2019   Hypokalemia 02/04/2019   Major depressive disorder, recurrent, severe without psychotic features (HCC) 01/31/2018    Past Surgical History:  Procedure Laterality Date   FINGER SURGERY      IRRIGATION AND DEBRIDEMENT SHOULDER Right 11/12/2018   Procedure: Irrigation And Debridement Shoulder;  Surgeon: Roby LoftsHaddix, Kevin P, MD;  Location: [facility] OR;  Service: Orthopedics;  Laterality: Right;   ORIF CLAVICULAR FRACTURE Right 11/12/2018   Procedure: OPEN REDUCTION INTERNAL FIXATION (ORIF) CLAVICULAR FRACTURE;  Surgeon: Roby LoftsHaddix, Kevin P, MD;  Location: [facility] OR;  Service: Orthopedics;  Laterality: Right;     OB History   No obstetric history on file.     Family History  Problem Relation Age of Onset   Alcohol abuse Other     Social History   Tobacco Use   Smoking status: Current Every Day Smoker    Packs/day: 1.00   Smokeless tobacco: Never Used  Vaping Use   Vaping Use: Never used  Substance Use Topics   Alcohol use: Yes    Comment: drinks alcohol daily   Drug use: Not Currently    Types: Heroin    Home Medications Prior to Admission medications   Medication Sig Start Date End Date Taking? Authorizing Provider  potassium chloride (KLOR-CON) 10 MEQ tablet Take 10 mEq by mouth 2 (two) times daily.   Yes [provider]  escitalopram (LEXAPRO) 10 MG tablet Take 1 tablet (10 mg total) by mouth daily. Patient not taking: Reported on 09/16/2019 03/22/19 11/16/19  Eddie Northhungel, Nishant, MD    Allergies    Patient has no known allergies.  Review of Systems   Review of Systems  Constitutional: Negative for fever.  HENT: Negative for rhinorrhea and sore throat.   Eyes: Negative for redness.  Respiratory: Negative for cough.   Cardiovascular: Negative for chest pain.  Gastrointestinal: Negative for abdominal pain, diarrhea, nausea and vomiting.  Genitourinary: Negative for dysuria, frequency, hematuria and urgency.  Musculoskeletal: Positive for myalgias.  Skin: Negative for rash.  Neurological: Positive for light-headedness. Negative for syncope and headaches.    Physical Exam Updated Vital Signs BP 110/83    Pulse 80    Temp 98 F (36.7 C) (Oral)    Resp 18    Ht  5\' 10"  (1.778 m)    Wt 59.4 kg    SpO2 100%    BMI 18.80 kg/m   Physical Exam Vitals and nursing note reviewed.  Constitutional:      General: She is not in acute distress.    Appearance: She is well-developed.  HENT:     Head: Normocephalic and atraumatic.     Right Ear: External ear normal.     Left Ear: External ear normal.     Nose: Nose normal.  Eyes:     Conjunctiva/sclera: Conjunctivae normal.  Cardiovascular:     Rate and Rhythm: Normal rate and regular rhythm.     Heart sounds: No murmur heard.   Pulmonary:     Effort: No respiratory distress.     Breath sounds: No wheezing, rhonchi or rales.  Abdominal:     Palpations: Abdomen is soft.     Tenderness: There is no abdominal tenderness. There is no guarding or rebound.  Musculoskeletal:     Cervical back: Normal range of motion and neck supple.     Right lower leg: No edema.     Left lower leg: No edema.  Skin:    General: Skin is warm and dry.     Findings: No rash.  Neurological:     General: No focal deficit present.     Mental Status: She is alert. Mental status is at baseline.     Motor: No weakness.     Comments: 5 out of 5 strength upper and lower extremities bilaterally.  Psychiatric:        Mood and Affect: Mood normal.     ED Results / Procedures / Treatments   Labs (all labs ordered are listed, but only abnormal results are displayed) Labs Reviewed  CBC WITH DIFFERENTIAL/PLATELET - Abnormal; Notable for the following components:      Result Value   RBC 5.48 (*)    Hemoglobin 15.6 (*)    HCT 46.8 (*)    All other components within normal limits  COMPREHENSIVE METABOLIC PANEL - Abnormal; Notable for the following components:   Sodium 134 (*)    Potassium 2.6 (*)    Chloride 79 (*)    CO2 38 (*)    Creatinine, Ser 1.12 (*)    Calcium 10.4 (*)    Total Protein 8.8 (*)    Albumin 5.4 (*)    Anion gap 17 (*)    All other components within normal limits  BASIC METABOLIC PANEL - Abnormal;  Notable for the following components:   Sodium 133 (*)    Potassium 3.0 (*)    Chloride 87 (*)    CO2 35 (*)    Glucose, Bld 105 (*)    Calcium 8.5 (*)    All other components within normal limits  MAGNESIUM    ED ECG REPORT   Date: 02/23/2020  Rate: 77  Rhythm: normal sinus rhythm  QRS Axis: normal  Intervals: normal  ST/T Wave abnormalities: nonspecific T wave changes  Conduction Disutrbances:none  Narrative Interpretation: u-wave noted  Old EKG Reviewed: unchanged  I have personally reviewed the EKG tracing and agree with the computerized printout as noted.  ED ECG REPORT   Date: 02/23/2020  Rate: 69  Rhythm: normal sinus rhythm  QRS Axis: normal  Intervals: normal  ST/T Wave abnormalities: normal  Conduction Disutrbances:none  Narrative Interpretation: resolution of u-wave  Old EKG Reviewed: changes noted  I have personally reviewed the EKG tracing and agree with the computerized printout as noted.  Radiology No results found.  Procedures Procedures (including critical care time)  Medications Ordered in ED Medications  sodium chloride 0.9 % bolus 1,000 mL (has no administration in time range)  potassium chloride SA (KLOR-CON) CR tablet 60 mEq (has no administration in time range)  potassium chloride 10 mEq in 100 mL IVPB (has no administration in time range)    ED Course  I have reviewed the triage vital signs and the nursing notes.  Pertinent labs & imaging results that were available during my care of the patient were reviewed by me and considered in my medical decision making (see chart for details).  Patient seen and examined. Potassium 2.6 here with normal magnesium. Ordered PO and 3 x IV. Will give IV hydration.   Vital signs reviewed and are as follows: BP 110/83    Pulse 80    Temp 98 F (36.7 C) (Oral)    Resp 18    Ht 5\' 10"  (1.778 m)    Wt 59.4 kg    SpO2 100%    BMI 18.80 kg/m   9:32 PM patient treated over several hours.   Clinically she has improved with resolution of muscle cramps.  Potassium 2.6 >> 3.0 QTc 608 >> 490  Plan for discharge home at this time.  Patient to continue twice a day potassium supplementation.  She is encouraged to return to the emergency department if her symptoms worsen and follow-up with her doctor in 1 week for recheck of potassium.  She verbalizes understanding, agrees with plan.  CRITICAL CARE Performed by: PA-C Total critical care time: 45 minutes Critical care time was exclusive of separately billable procedures and treating other patients. Critical care was necessary to treat or prevent imminent or life-threatening deterioration. Critical care was time spent personally by me on the following activities: development of treatment plan with patient and/or surrogate as well as nursing, discussions with consultants, evaluation of patient's response to treatment, examination of patient, obtaining history from patient or surrogate, ordering and performing treatments and interventions, ordering and review of laboratory studies, ordering and review of radiographic studies, pulse oximetry and re-evaluation of patient's condition.    MDM Rules/Calculators/A&P                          Patient with hypokalemia, history of same.  Recently started potassium supplementation.  Found to have prolonged QT and potassium of 2.6 here.  She had intermittent muscle cramps.  Patient was treated with IV fluids, IV potassium, oral potassium over the course of several hours with clinical improvement.  Potassium and QTC improved as above.  Chloride and bicarb improving.  Patient looks very well.  At this point, no indication for hospitalization.  She will continue oral supplementation at home and follow-up with her doctor in the next week for  recheck.  She has a history of hypokalemia and is aware of symptoms which should cause her to return to the emergency department.   Final Clinical  Impression(s) / ED Diagnoses Final diagnoses:  Hypokalemia    Rx / DC Orders ED Discharge Orders    None       Renne Crigler, Cordelia Poche 02/23/20 2137    Sabas Sous, MD 02/23/20 2312

## 2020-03-01 ENCOUNTER — Other Ambulatory Visit: Payer: Self-pay

## 2020-03-01 ENCOUNTER — Emergency Department (HOSPITAL_COMMUNITY)
Admission: EM | Admit: 2020-03-01 | Discharge: 2020-03-02 | Disposition: A | Discharge status: Home / Self Care | Payer: Self-pay | Attending: Emergency Medicine | Admitting: Emergency Medicine

## 2020-03-01 ENCOUNTER — Encounter (HOSPITAL_COMMUNITY): Payer: Self-pay

## 2020-03-01 ENCOUNTER — Emergency Department (HOSPITAL_COMMUNITY)
Admission: EM | Admit: 2020-03-01 | Discharge: 2020-03-01 | Discharge status: Against Medical Advice | Payer: Self-pay | Attending: Emergency Medicine | Admitting: Emergency Medicine

## 2020-03-01 DIAGNOSIS — E876 Hypokalemia: Secondary | ICD-10-CM | POA: Insufficient documentation

## 2020-03-01 DIAGNOSIS — F10120 Alcohol abuse with intoxication, uncomplicated: Secondary | ICD-10-CM | POA: Insufficient documentation

## 2020-03-01 DIAGNOSIS — F17200 Nicotine dependence, unspecified, uncomplicated: Secondary | ICD-10-CM | POA: Insufficient documentation

## 2020-03-01 DIAGNOSIS — R4182 Altered mental status, unspecified: Secondary | ICD-10-CM | POA: Insufficient documentation

## 2020-03-01 DIAGNOSIS — F10921 Alcohol use, unspecified with intoxication delirium: Secondary | ICD-10-CM | POA: Insufficient documentation

## 2020-03-01 DIAGNOSIS — F10920 Alcohol use, unspecified with intoxication, uncomplicated: Secondary | ICD-10-CM

## 2020-03-01 LAB — BASIC METABOLIC PANEL
Anion gap: 7 (ref 5–15)
BUN: 7 mg/dL (ref 6–20)
CO2: 24 mmol/L (ref 22–32)
Calcium: 5.8 mg/dL — CL (ref 8.9–10.3)
Chloride: 113 mmol/L — ABNORMAL HIGH (ref 98–111)
Creatinine, Ser: 0.45 mg/dL (ref 0.44–1.00)
GFR calc Af Amer: >60 mL/min (ref >60)
GFR calc non Af Amer: >60 mL/min (ref >60)
Glucose, Bld: 81 mg/dL (ref 70–99)
Potassium: 2.6 mmol/L — CL (ref 3.5–5.1)
Sodium: 144 mmol/L (ref 135–145)

## 2020-03-01 LAB — ETHANOL: Alcohol, Ethyl (B): 379 mg/dL (ref <10)

## 2020-03-01 LAB — RAPID URINE DRUG SCREEN, HOSP PERFORMED
Amphetamines: NOT DETECTED
Barbiturates: NOT DETECTED
Benzodiazepines: NOT DETECTED
Cocaine: NOT DETECTED
Opiates: NOT DETECTED
Tetrahydrocannabinol: NOT DETECTED

## 2020-03-01 LAB — COMPREHENSIVE METABOLIC PANEL
ALT: 21 U/L (ref 0–44)
AST: 29 U/L (ref 15–41)
Albumin: 4 g/dL (ref 3.5–5.0)
Alkaline Phosphatase: 72 U/L (ref 38–126)
Anion gap: 15 (ref 5–15)
BUN: 10 mg/dL (ref 6–20)
CO2: 30 mmol/L (ref 22–32)
Calcium: 8.6 mg/dL — ABNORMAL LOW (ref 8.9–10.3)
Chloride: 96 mmol/L — ABNORMAL LOW (ref 98–111)
Creatinine, Ser: 0.72 mg/dL (ref 0.44–1.00)
GFR calc Af Amer: >60 mL/min (ref >60)
GFR calc non Af Amer: >60 mL/min (ref >60)
Glucose, Bld: 90 mg/dL (ref 70–99)
Potassium: 2.4 mmol/L — CL (ref 3.5–5.1)
Sodium: 141 mmol/L (ref 135–145)
Total Bilirubin: 0.3 mg/dL (ref 0.3–1.2)
Total Protein: 6.6 g/dL (ref 6.5–8.1)

## 2020-03-01 LAB — CBC WITH DIFFERENTIAL/PLATELET
Abs Immature Granulocytes: 0.01 10*3/uL (ref 0.00–0.07)
Basophils Absolute: 0 10*3/uL (ref 0.0–0.1)
Basophils Relative: 1 %
Eosinophils Absolute: 0 10*3/uL (ref 0.0–0.5)
Eosinophils Relative: 0 %
HCT: 38.2 % (ref 36.0–46.0)
Hemoglobin: 12.6 g/dL (ref 12.0–15.0)
Immature Granulocytes: 0 %
Lymphocytes Relative: 51 %
Lymphs Abs: 2.1 10*3/uL (ref 0.7–4.0)
MCH: 28.8 pg (ref 26.0–34.0)
MCHC: 33 g/dL (ref 30.0–36.0)
MCV: 87.4 fL (ref 80.0–100.0)
Monocytes Absolute: 0.5 10*3/uL (ref 0.1–1.0)
Monocytes Relative: 12 %
Neutro Abs: 1.5 10*3/uL — ABNORMAL LOW (ref 1.7–7.7)
Neutrophils Relative %: 36 %
Platelets: 222 10*3/uL (ref 150–400)
RBC: 4.37 MIL/uL (ref 3.87–5.11)
RDW: 14.5 % (ref 11.5–15.5)
WBC: 4.1 10*3/uL (ref 4.0–10.5)
nRBC: 0 % (ref 0.0–0.2)

## 2020-03-01 LAB — MAGNESIUM: Magnesium: 2.2 mg/dL (ref 1.7–2.4)

## 2020-03-01 LAB — URINALYSIS, ROUTINE W REFLEX MICROSCOPIC
Bilirubin Urine: NEGATIVE
Glucose, UA: NEGATIVE mg/dL
Hgb urine dipstick: NEGATIVE
Ketones, ur: NEGATIVE mg/dL
Leukocytes,Ua: NEGATIVE
Nitrite: NEGATIVE
Protein, ur: NEGATIVE mg/dL
Specific Gravity, Urine: 1.004 — ABNORMAL LOW (ref 1.005–1.030)
pH: 6 (ref 5.0–8.0)

## 2020-03-01 LAB — ACETAMINOPHEN LEVEL: Acetaminophen (Tylenol), Serum: <10 ug/mL — ABNORMAL LOW (ref 10–30)

## 2020-03-01 LAB — I-STAT BETA HCG BLOOD, ED (MC, WL, AP ONLY): I-stat hCG, quantitative: <5 m[IU]/mL (ref <5)

## 2020-03-01 MED ORDER — POTASSIUM CHLORIDE CRYS ER 20 MEQ PO TBCR: 40.0000 meq | EXTENDED_RELEASE_TABLET | Freq: Once | ORAL | Status: DC

## 2020-03-01 MED ORDER — NALOXONE HCL 0.4 MG/ML IJ SOLN: 0.4000 mg | INTRAMUSCULAR | Status: DC | PRN

## 2020-03-01 MED ORDER — THIAMINE HCL 100 MG/ML IJ SOLN
Freq: Once | INTRAVENOUS | Status: AC
  Filled 2020-03-01: qty 1000

## 2020-03-01 MED ORDER — POTASSIUM CHLORIDE CRYS ER 20 MEQ PO TBCR
40.0000 meq | EXTENDED_RELEASE_TABLET | Freq: Once | ORAL | Status: DC
  Filled 2020-03-01: qty 2

## 2020-03-01 MED ORDER — SODIUM CHLORIDE 0.9 % IV BOLUS
1000.0000 mL | Freq: Once | INTRAVENOUS | Status: AC
  Administered 2020-03-01: 1000 mL via INTRAVENOUS

## 2020-03-01 MED ORDER — MAGNESIUM SULFATE 2 GM/50ML IV SOLN
2.0000 g | Freq: Once | INTRAVENOUS | Status: AC
  Administered 2020-03-01: 2 g via INTRAVENOUS
  Filled 2020-03-01: qty 50

## 2020-03-01 MED ORDER — POTASSIUM CHLORIDE 10 MEQ/100ML IV SOLN
10.0000 meq | INTRAVENOUS | Status: AC
  Administered 2020-03-01 (×4): 10 meq via INTRAVENOUS
  Filled 2020-03-01 (×4): qty 100

## 2020-03-01 MED ORDER — POTASSIUM CHLORIDE ER 10 MEQ PO TBCR: 20.0000 meq | EXTENDED_RELEASE_TABLET | Freq: Every day | ORAL | 0 refills | Status: DC

## 2020-03-01 NOTE — ED Provider Notes (Addendum)
[facility]-EMERGENCY DEPT Provider Note: Lowella Dell, MD, FACEP  CSN: [id number] MRN: [id number] ARRIVAL: 03/01/20 at 0305 ROOM: WA10/[facility]10   CHIEF COMPLAINT  Altered Mental Status  Level 5 caveat: Altered mental status HISTORY OF PRESENT ILLNESS  03/01/20 3:35 AM Sheila Graves is a 39 y.o. female with history of alcoholism with multiple visits for same.  She was found at a [facility] just prior to arrival leaning against the wall.  She was noted to be relatively unresponsive.  EMS found her to respond to a sternal rub and chose not to give Narcan.  She was able to admit to them that she had taken Percocet this morning but tells me she did not take any Percocet and denies any alcohol.  She has had a waxing and waning level of consciousness prior to arrival and continues to go in and out of consciousness depending on level of stimulation.  EMS found her to be mildly hypotensive (systolic in the 90s) and gave her an IV fluid bolus prior to arrival.    Past Medical History:  Diagnosis Date  . Alcohol withdrawal seizure (HCC)   . Alcoholic (HCC)   . ETOH abuse   . Hypokalemia   . Pancreatitis   . Thyroid disease     Past Surgical History:  Procedure Laterality Date  . FINGER SURGERY    . IRRIGATION AND DEBRIDEMENT SHOULDER Right 11/12/2018   Procedure: Irrigation And Debridement Shoulder;  Surgeon: Roby Lofts, MD;  Location: [facility] OR;  Service: Orthopedics;  Laterality: Right;  . ORIF CLAVICULAR FRACTURE Right 11/12/2018   Procedure: OPEN REDUCTION INTERNAL FIXATION (ORIF) CLAVICULAR FRACTURE;  Surgeon: Roby Lofts, MD;  Location: [facility] OR;  Service: Orthopedics;  Laterality: Right;    Family History  Problem Relation Age of Onset  . Alcohol abuse Other     Social History   Tobacco Use  . Smoking status: Current Every Day Smoker    Packs/day: 1.00  . Smokeless tobacco: Never Used  Vaping Use  . Vaping Use: Never used  Substance Use Topics  . Alcohol use: Yes    Comment: drinks  alcohol daily  . Drug use: Not Currently    Types: Heroin    Prior to Admission medications   Medication Sig Start Date End Date Taking? Authorizing Provider  potassium chloride (KLOR-CON) 10 MEQ tablet Take 10 mEq by mouth 2 (two) times daily.    [provider]  escitalopram (LEXAPRO) 10 MG tablet Take 1 tablet (10 mg total) by mouth daily. Patient not taking: Reported on 09/16/2019 03/22/19 11/16/19  Eddie North, MD    Allergies Patient has no known allergies.   REVIEW OF SYSTEMS  Negative except as noted here or in the History of Present Illness.   PHYSICAL EXAMINATION  Initial Vital Signs Blood pressure 98/77, pulse 81, resp. rate 16, SpO2 96 %.  Examination General: Well-developed, well-nourished female in no acute distress; appearance consistent with age of record HENT: normocephalic; atraumatic Eyes: pupils equal, round and reactive to light; disconjugate gaze when asleep but will make eye contact when awake Neck: supple Heart: regular rate and rhythm Lungs: clear to auscultation bilaterally Abdomen: soft; nondistended; nontender; bowel sounds present Extremities: No deformity; full range of motion; pulses normal; ecchymoses right forearm:    Neurologic: Somnolent but arousable; dysarthria; confused; ataxia; noted to move all extremities Skin: Warm and dry   RESULTS  Summary of this visit's results, reviewed and interpreted by myself:   EKG Interpretation  Date/Time:  Friday March 01 2020 04:20:31 EDT Ventricular Rate:  82 PR Interval:    QRS Duration: 108 QT Interval:  415 QTC Calculation: 485 R Axis:   75 Text Interpretation: Sinus rhythm Abnormal R-wave progression, early transition No significant change was found Confirmed by Paula Libra ([id number]) on 03/01/2020 4:42:38 AM      Laboratory Studies: Results for orders placed or performed during the hospital encounter of 03/01/20 (from the past 24 hour(s))  CBC with Differential/Platelet      Status: Abnormal   Collection Time: 03/01/20  3:18 AM  Result Value Ref Range   WBC 4.1 4.0 - 10.5 K/uL   RBC 4.37 3.87 - 5.11 MIL/uL   Hemoglobin 12.6 12.0 - 15.0 g/dL   HCT 69.4 36 - 46 %   MCV 87.4 80.0 - 100.0 fL   MCH 28.8 26.0 - 34.0 pg   MCHC 33.0 30.0 - 36.0 g/dL   RDW 85.4 62.7 - 03.5 %   Platelets 222 150 - 400 K/uL   nRBC 0.0 0.0 - 0.2 %   Neutrophils Relative % 36 %   Neutro Abs 1.5 (L) 1.7 - 7.7 K/uL   Lymphocytes Relative 51 %   Lymphs Abs 2.1 0.7 - 4.0 K/uL   Monocytes Relative 12 %   Monocytes Absolute 0.5 0 - 1 K/uL   Eosinophils Relative 0 %   Eosinophils Absolute 0.0 0 - 0 K/uL   Basophils Relative 1 %   Basophils Absolute 0.0 0 - 0 K/uL   Immature Granulocytes 0 %   Abs Immature Granulocytes 0.01 0.00 - 0.07 K/uL  Comprehensive metabolic panel     Status: Abnormal   Collection Time: 03/01/20  3:18 AM  Result Value Ref Range   Sodium 141 135 - 145 mmol/L   Potassium 2.4 (LL) 3.5 - 5.1 mmol/L   Chloride 96 (L) 98 - 111 mmol/L   CO2 30 22 - 32 mmol/L   Glucose, Bld 90 70 - 99 mg/dL   BUN 10 6 - 20 mg/dL   Creatinine, Ser 0.09 0.44 - 1.00 mg/dL   Calcium 8.6 (L) 8.9 - 10.3 mg/dL   Total Protein 6.6 6.5 - 8.1 g/dL   Albumin 4.0 3.5 - 5.0 g/dL   AST 29 15 - 41 U/L   ALT 21 0 - 44 U/L   Alkaline Phosphatase 72 38 - 126 U/L   Total Bilirubin 0.3 0.3 - 1.2 mg/dL   GFR calc non Af Amer >60 >60 mL/min   GFR calc Af Amer >60 >60 mL/min   Anion gap 15 5 - 15  Ethanol     Status: Abnormal   Collection Time: 03/01/20  3:18 AM  Result Value Ref Range   Alcohol, Ethyl (B) 379 (HH) <10 mg/dL  Acetaminophen level     Status: Abnormal   Collection Time: 03/01/20  3:18 AM  Result Value Ref Range   Acetaminophen (Tylenol), Serum <10 (L) 10 - 30 ug/mL  I-Stat Beta hCG blood, ED ([facility], [facility], [facility] only)     Status: None   Collection Time: 03/01/20  3:48 AM  Result Value Ref Range   I-stat hCG, quantitative <5.0 <5 mIU/mL   Comment 3          Rapid urine drug screen  (hospital performed)     Status: None   Collection Time: 03/01/20  4:36 AM  Result Value Ref Range   Opiates NONE DETECTED NONE DETECTED   Cocaine NONE DETECTED NONE DETECTED   Benzodiazepines  NONE DETECTED NONE DETECTED   Amphetamines NONE DETECTED NONE DETECTED   Tetrahydrocannabinol NONE DETECTED NONE DETECTED   Barbiturates NONE DETECTED NONE DETECTED  Urinalysis, Routine w reflex microscopic Urine, Catheterized     Status: Abnormal   Collection Time: 03/01/20  4:36 AM  Result Value Ref Range   Color, Urine STRAW (A) YELLOW   APPearance CLEAR CLEAR   Specific Gravity, Urine 1.004 (L) 1.005 - 1.030   pH 6.0 5.0 - 8.0   Glucose, UA NEGATIVE NEGATIVE mg/dL   Hgb urine dipstick NEGATIVE NEGATIVE   Bilirubin Urine NEGATIVE NEGATIVE   Ketones, ur NEGATIVE NEGATIVE mg/dL   Protein, ur NEGATIVE NEGATIVE mg/dL   Nitrite NEGATIVE NEGATIVE   Leukocytes,Ua NEGATIVE NEGATIVE   Imaging Studies: No results found.  ED COURSE and MDM  Nursing notes, initial and subsequent vitals signs, including pulse oximetry, reviewed and interpreted by myself.  Vitals:   03/01/20 0338 03/01/20 0445 03/01/20 0545 03/01/20 0636  BP: 98/77 (!) 89/65 97/74 (!) 82/53  Pulse: 81 73 79 71  Resp: 16 15 16 15   TempSrc:   Oral   SpO2: 96% 94% 99% 100%   Medications  naloxone (NARCAN) injection 0.4 mg (has no administration in time range)  potassium chloride 10 mEq in 100 mL IVPB (10 mEq Intravenous New Bag/Given 03/01/20 0636)  potassium chloride SA (KLOR-CON) CR tablet 40 mEq (40 mEq Oral Not Given 03/01/20 0449)  sodium chloride 0.9 % 1,000 mL with thiamine 100 mg, folic acid 1 mg, multivitamins adult 10 mL infusion ( Intravenous New Bag/Given 03/01/20 0448)   4:04 AM Potassium repletion ordered.   6:46 AM Signed out to morning EDP. Recheck potassium pending at 8:30AM.    PROCEDURES  Procedures   ED DIAGNOSES     ICD-10-CM   1. Alcohol intoxication with delirium (HCC)  F10.921   2. Hypokalemia   E87.6        Elanore Talcott, 05/01/20, MD 03/01/20 0443    05/01/20, MD 03/01/20 [phone]

## 2020-03-01 NOTE — Discharge Instructions (Signed)
Take the potassium supplement as prescribed.  Please have your primary doctor repeat your electrolyte levels soon.  We recommended further observation in ER and further monitoring of your electrolytes.  If you change your mind and want to be reevaluated at any time you can come back.  Additionally would recommend coming back if you have any chest pain, difficulty breathing or other new concerning symptom.

## 2020-03-01 NOTE — ED Provider Notes (Signed)
[facility]-EMERGENCY DEPT Provider Note   CSN: [id number] Arrival date & time: 03/01/20  1710     History Chief Complaint  Patient presents with  . Alcohol Intoxication    Sheila Graves is a 39 y.o. female.  HPI Patient presents again for evaluation of suspected alcohol intoxication.  She was discharged from this ED about 9 hours ago after an evaluation for the same.  She is unable to give any history.  She was apparently found outside of a gas station today, sleepy but responsive to painful stimuli.  Level 5 caveat-altered mental status    Past Medical History:  Diagnosis Date  . Alcohol withdrawal seizure (HCC)   . Alcoholic (HCC)   . ETOH abuse   . Hypokalemia   . Pancreatitis   . Thyroid disease     Patient Active Problem List   Diagnosis Date Noted  . Hypophosphatemia 07/31/2019  . Alcohol use disorder, severe, dependence (HCC)   . Prolonged QT interval 03/16/2019  . Hypokalemia 02/04/2019  . 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 FINGER SURGERY    . IRRIGATION AND DEBRIDEMENT SHOULDER Right 11/12/2018   Procedure: Irrigation And Debridement Shoulder;  Surgeon: Roby Lofts, MD;  Location: [facility] OR;  Service: Orthopedics;  Laterality: Right;  . ORIF CLAVICULAR FRACTURE Right 11/12/2018   Procedure: OPEN REDUCTION INTERNAL FIXATION (ORIF) CLAVICULAR FRACTURE;  Surgeon: Roby Lofts, MD;  Location: [facility] OR;  Service: Orthopedics;  Laterality: Right;     OB History   No obstetric history on file.     Family History  Problem Relation Age of Onset  . Alcohol abuse Other     Social History   Tobacco Use  . Smoking status: Current Every Day Smoker    Packs/day: 1.00  . Smokeless tobacco: Never Used  Vaping Use  . Vaping Use: Never used  Substance Use Topics  . Alcohol use: Yes    Comment: drinks alcohol daily  . Drug use: Not  Currently    Types: Heroin    Home Medications Prior to Admission medications   Medication Sig Start Date End Date Taking? Authorizing Provider  potassium chloride (KLOR-CON) 10 MEQ tablet Take 2 tablets (20 mEq total) by mouth daily for 14 days. 03/01/20 03/15/20  Milagros Loll, MD  escitalopram (LEXAPRO) 10 MG tablet Take 1 tablet (10 mg total) by mouth daily. Patient not taking: Reported on 09/16/2019 03/22/19 11/16/19  Eddie North, MD    Allergies    Patient has no known allergies.  Review of Systems   Review of Systems  Unable to perform ROS: Mental status change    Physical Exam Updated Vital Signs BP 93/61 (BP Location: Right Arm)   Pulse 82   Temp (!) 97.3 F (36.3 C) (Axillary)   Resp 17   Ht 5\' 10"  (1.778 m)   Wt 59.4 kg   SpO2 95%   BMI 18.80 kg/m   Physical Exam Vitals and nursing note reviewed.  Constitutional:      General: She is not in acute distress.    Appearance: She is well-developed. She is not ill-appearing, toxic-appearing or diaphoretic.  HENT:     Head: Normocephalic and atraumatic.     Right Ear: External ear normal.     Left Ear: External ear normal.  Eyes:     Conjunctiva/sclera: Conjunctivae normal.     Pupils: Pupils are equal,  round, and reactive to light.  Neck:     Trachea: Phonation normal.  Cardiovascular:     Rate and Rhythm: Normal rate.  Pulmonary:     Effort: Pulmonary effort is normal. No respiratory distress.     Breath sounds: No stridor.  Abdominal:     General: There is no distension.     Palpations: There is no mass.     Tenderness: There is no abdominal tenderness.  Musculoskeletal:        General: Normal range of motion.     Cervical back: Normal range of motion and neck supple.  Skin:    General: Skin is warm and dry.  Neurological:     Mental Status: She is alert.     Motor: No abnormal muscle tone.     Comments: Normal muscular tone and light bilaterally.  She is sleeping, arousable only with  movement toward painful stimuli.  Psychiatric:     Comments: Lethargic     ED Results / Procedures / Treatments   Labs (all labs ordered are listed, but only abnormal results are displayed) Labs Reviewed - No data to display  EKG None  Radiology No results found.  Procedures Procedures (including critical care time)  Medications Ordered in ED Medications  potassium chloride SA (KLOR-CON) CR tablet 40 mEq (has no administration in time range)    ED Course  I have reviewed the triage vital signs and the nursing notes.  Pertinent labs & imaging results that were available during my care of the patient were reviewed by me and considered in my medical decision making (see chart for details).  Clinical Course as of Mar 01 2226  Fri Mar 01, 2020  2226 She continues to be sleeping and is comfortable.   [EW]    Clinical Course User Index [EW] Mancel Bale, MD   MDM Rules/Calculators/A&P                           Patient Vitals for the past 24 hrs:  BP Temp Temp src Pulse Resp SpO2 Height Weight  03/01/20 1736 -- -- -- -- -- -- 5\' 10"  (1.778 m) 59.4 kg  03/01/20 1725 93/61 (!) 97.3 F (36.3 C) Axillary 82 17 95 % -- --  03/01/20 1723 -- -- -- -- -- 100 % -- --     Medical Decision Making:  This patient is presenting for evaluation of suspected alcohol intoxication, which does require a range of treatment options, and is a complaint that involves a moderate risk of morbidity and mortality. The differential diagnoses include nonspecific intoxicants causing lethargy. I decided to review old records, and in summary patient frequently presenting to ED with alcohol intoxication, and appears to have done the same again.  Note that she has a full suitcase with her.  Plan-observe patient until sober.  Give potassium if she wakes up enough to swallow pills.  Critical Interventions-clinical evaluation, observation  After These Interventions, the Patient was reevaluated and  was found with likely alcohol intoxication  CRITICAL CARE-no Performed by: 05/01/20  Nursing Notes Reviewed/ Care Coordinated Applicable Imaging Reviewed Interpretation of Laboratory Data incorporated into ED treatment  Disposition per oncoming provider team after become sober    Final Clinical Impression(s) / ED Diagnoses Final diagnoses:  Alcoholic intoxication without complication (HCC)  Hypokalemia    Rx / DC Orders ED Discharge Orders    None       Mancel Bale,  MD 03/02/20 8299

## 2020-03-01 NOTE — ED Provider Notes (Signed)
I assumed care of this patient.  Please see previous provider note for further details of Hx, PE.  Briefly patient is a 39 y.o. female who presented for alcohol intoxication.  Hemodynamically stable.  Plan to to allow her to metabolize.  1:06 AM   Patient alert oriented.  Appears to be sober.  Ambulates without complication.  The patient appears reasonably screened and/or stabilized for discharge and I doubt any other medical condition or other [facility] requiring further screening, evaluation, or treatment in the ED at this time prior to discharge. Safe for discharge with strict return precautions.  Disposition: Discharge  Condition: Good  I have discussed the results, Dx and Tx plan with the patient/family who expressed understanding and agree(s) with the plan. Discharge instructions discussed at length. The patient/family was given strict return precautions who verbalized understanding of the instructions. No further questions at time of discharge.    ED Discharge Orders    None       Follow Up: Herma Carson, MD [address] [place] [address] [phone]  Schedule an appointment as soon as possible for a visit          Shauna Bodkins, Amadeo Garnet, MD 03/02/20 [phone]

## 2020-03-01 NOTE — ED Triage Notes (Signed)
Pt was [place] out [place] [employer] [place] verbalizes she took pain meds tonight. Assessment in progress.

## 2020-03-01 NOTE — ED Triage Notes (Addendum)
Per EMS- Patient was a [employer]. Patient smells of ETOH. Patient lethargic. Patient is responsive to pain. PERRL. Patient incontinent of urine.

## 2020-03-01 NOTE — ED Notes (Signed)
Dois Davenport Gerke/mother   [phone]

## 2020-09-03 ENCOUNTER — Other Ambulatory Visit: Payer: Self-pay

## 2020-09-03 ENCOUNTER — Emergency Department (HOSPITAL_BASED_OUTPATIENT_CLINIC_OR_DEPARTMENT_OTHER): Payer: Self-pay

## 2020-09-03 ENCOUNTER — Encounter (HOSPITAL_BASED_OUTPATIENT_CLINIC_OR_DEPARTMENT_OTHER): Payer: Self-pay

## 2020-09-03 ENCOUNTER — Emergency Department (HOSPITAL_BASED_OUTPATIENT_CLINIC_OR_DEPARTMENT_OTHER)
Admission: EM | Admit: 2020-09-03 | Discharge: 2020-09-03 | Disposition: A | Discharge status: Home / Self Care | Payer: Self-pay | Attending: Emergency Medicine | Admitting: Emergency Medicine

## 2020-09-03 DIAGNOSIS — X500XXA Overexertion from strenuous movement or load, initial encounter: Secondary | ICD-10-CM | POA: Insufficient documentation

## 2020-09-03 DIAGNOSIS — G8929 Other chronic pain: Secondary | ICD-10-CM

## 2020-09-03 DIAGNOSIS — Z87891 Personal history of nicotine dependence: Secondary | ICD-10-CM | POA: Insufficient documentation

## 2020-09-03 DIAGNOSIS — M25511 Pain in right shoulder: Secondary | ICD-10-CM | POA: Insufficient documentation

## 2020-09-03 MED ORDER — NAPROXEN 500 MG PO TABS: 500.0000 mg | ORAL_TABLET | Freq: Two times a day (BID) | ORAL | 0 refills | Status: DC

## 2020-09-03 NOTE — ED Provider Notes (Signed)
[facility] EMERGENCY DEPARTMENT Provider Note   CSN: [id number] Arrival date & time: 09/03/20  1016     History Chief Complaint  Patient presents with  . Shoulder Pain    Sheila Graves is a 40 y.o. female.  HPI   Patient presents to the ER for evaluation of right shoulder pain.  Patient states he was lifting a heavy bag few weeks ago.  She felt her shoulder pop.  She was evaluated by Dr. In Hilda Lias and had x-rays.  Patient was told there were no acute fractures or dislocations.  Patient was told her symptoms should improve within a week but she continues to have pain in her right shoulder.  Patient did have a prior injury to that shoulder and she is also had some persistent discomfort ever since her surgery a couple years ago.  Past Medical History:  Diagnosis Date  . Alcohol withdrawal seizure (HCC)   . Alcoholic (HCC)   . ETOH abuse   . Hypokalemia   . Pancreatitis   . Thyroid disease     Patient Active Problem List   Diagnosis Date Noted  . Hypophosphatemia 07/31/2019  . Alcohol use disorder, severe, dependence (HCC)   . Prolonged QT interval 03/16/2019  . Hypokalemia 02/04/2019  . 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 FINGER SURGERY    . IRRIGATION AND DEBRIDEMENT SHOULDER Right 11/12/2018   Procedure: Irrigation And Debridement Shoulder;  Surgeon: Roby Lofts, MD;  Location: [facility] OR;  Service: Orthopedics;  Laterality: Right;  . ORIF CLAVICULAR FRACTURE Right 11/12/2018   Procedure: OPEN REDUCTION INTERNAL FIXATION (ORIF) CLAVICULAR FRACTURE;  Surgeon: Roby Lofts, MD;  Location: [facility] OR;  Service: Orthopedics;  Laterality: Right;     OB History   No obstetric history on file.     Family History  Problem Relation Age of Onset  . Alcohol abuse Other     Social History   Tobacco Use  . Smoking status: Former Smoker    Packs/day: 1.00   . Smokeless tobacco: Never Used  Vaping Use  . Vaping Use: Never used  Substance Use Topics  . Alcohol use: Yes    Comment: social  . Drug use: Not Currently    Types: Heroin    Home Medications Prior to Admission medications   Medication Sig Start Date End Date Taking? Authorizing Provider  naproxen (NAPROSYN) 500 MG tablet Take 1 tablet (500 mg total) by mouth 2 (two) times daily. 09/03/20  Yes Linwood Dibbles, MD  potassium chloride (KLOR-CON) 10 MEQ tablet Take 2 tablets (20 mEq total) by mouth daily for 14 days. 03/01/20 03/15/20  Milagros Loll, MD  escitalopram (LEXAPRO) 10 MG tablet Take 1 tablet (10 mg total) by mouth daily. Patient not taking: Reported on 09/16/2019 03/22/19 11/16/19  Eddie North, MD    Allergies    Patient has no known allergies.  Review of Systems   Review of Systems  All other systems reviewed and are negative.   Physical Exam Updated Vital Signs BP 111/84 (BP Location: Left Arm)   Pulse 87   Temp 98.3 F (36.8 C) (Oral)   Resp 15   Ht 1.803 m (5\' 11" )   Wt 63.5 kg   LMP 08/14/2020   SpO2 98%   BMI 19.53 kg/m   Physical Exam Vitals and nursing note reviewed.  Constitutional:      General: She is  not in acute distress.    Appearance: She is well-developed.  HENT:     Head: Normocephalic and atraumatic.     Right Ear: External ear normal.     Left Ear: External ear normal.  Eyes:     General: No scleral icterus.       Right eye: No discharge.        Left eye: No discharge.     Conjunctiva/sclera: Conjunctivae normal.  Neck:     Trachea: No tracheal deviation.  Cardiovascular:     Rate and Rhythm: Normal rate.  Pulmonary:     Effort: Pulmonary effort is normal. No respiratory distress.     Breath sounds: No stridor.  Abdominal:     General: There is no distension.  Musculoskeletal:        General: No swelling or deformity.     Cervical back: Neck supple.     Comments: Mild tenderness palpation right shoulder, pain with range  of motion, no erythema or edema  Skin:    General: Skin is warm and dry.     Findings: No rash.  Neurological:     Mental Status: She is alert.     Cranial Nerves: Cranial nerve deficit: no gross deficits.     ED Results / Procedures / Treatments   Labs (all labs ordered are listed, but only abnormal results are displayed) Labs Reviewed - No data to display  EKG None  Radiology DG Shoulder Right  Result Date: 09/03/2020 CLINICAL DATA:  Injury.  Pain. EXAM: RIGHT SHOULDER - 2+ VIEW COMPARISON:  Chest x-ray 07/30/2019. FINDINGS: Plate screw fixation of the right clavicle. Hardware intact. No acute fracture or dislocation. Acromioclavicular and glenohumeral degenerative change noted. Loose bodies may be present. IMPRESSION: 1. Plate and screw fixation of the right clavicle. Hardware intact. Anatomic alignment. No acute bony abnormality. 2. Acromioclavicular and glenohumeral degenerative change. Loose bodies may be present. Electronically Signed   By: Maisie Fus  Register   On: 09/03/2020 11:17    Procedures Procedures   Medications Ordered in ED Medications - No data to display  ED Course  I have reviewed the triage vital signs and the nursing notes.  Pertinent labs & imaging results that were available during my care of the patient were reviewed by me and considered in my medical decision making (see chart for details).    MDM Rules/Calculators/A&P                          Patient's x-rays do not show any acute abnormalities.  He does have some findings of degenerative changes in the [facility] joint and the glenohumeral joint.  Patient symptoms could be related to rotator cuff injury as well considering her history.  No acute findings noted on x-ray.  Considering her persistent symptoms I do recommend a course of NSAIDs and follow-up with either an orthopedic doctor or sports medicine specialist Final Clinical Impression(s) / ED Diagnoses Final diagnoses:  Chronic right shoulder pain     Rx / DC Orders ED Discharge Orders         Ordered    naproxen (NAPROSYN) 500 MG tablet  2 times daily        09/03/20 1205           Linwood Dibbles, MD 09/03/20 1211

## 2020-09-03 NOTE — ED Triage Notes (Signed)
Pt arrives with pain to shoulder, reports lifting a heavy bag about 3 weeks ago and feeling her shoulder pop states that she had X-Rays in [place] and has had continued pain to right shoulder since.

## 2020-09-03 NOTE — Discharge Instructions (Addendum)
Take the medications as prescribed to help with the pain.  Follow-up with either an orthopedic doctor or sports medicine doctor for further evaluation as we discussed.  Listed in your discharge instructions are Dr Cyndia Diver and Dr Donnie Mesa information.

## 2020-09-05 ENCOUNTER — Ambulatory Visit: Payer: Self-pay | Admitting: Family Medicine

## 2020-09-05 NOTE — Progress Notes (Deleted)
  Sheila Graves - 40 y.o. female MRN [id number]  Date of birth: Nov 20, 1980  SUBJECTIVE:  Including CC & ROS.  No chief complaint on file.   Sheila Graves is a 40 y.o. female that is  ***.  ***   Review of Systems See HPI   HISTORY: Past Medical, Surgical, Social, and Family History Reviewed & Updated per EMR.   Pertinent Historical Findings include:  Past Medical History:  Diagnosis Date  . Alcohol withdrawal seizure (HCC)   . Alcoholic (HCC)   . ETOH abuse   . Hypokalemia   . Pancreatitis   . Thyroid disease     Past Surgical History:  Procedure Laterality Date  . FINGER SURGERY    . IRRIGATION AND DEBRIDEMENT SHOULDER Right 11/12/2018   Procedure: Irrigation And Debridement Shoulder;  Surgeon: Roby Lofts, MD;  Location: [facility] OR;  Service: Orthopedics;  Laterality: Right;  . ORIF CLAVICULAR FRACTURE Right 11/12/2018   Procedure: OPEN REDUCTION INTERNAL FIXATION (ORIF) CLAVICULAR FRACTURE;  Surgeon: Roby Lofts, MD;  Location: [facility] OR;  Service: Orthopedics;  Laterality: Right;    Family History  Problem Relation Age of Onset  . Alcohol abuse Other     Social History   Socioeconomic History  . Marital status: Married    Spouse name: Sheila Graves  . Number of children: Not on file  . Years of education: Not on file  . Highest education level: Not on file  Occupational History  . Occupation: Unemployed  Tobacco Use  . Smoking status: Former Smoker    Packs/day: 1.00  . Smokeless tobacco: Never Used  Vaping Use  . Vaping Use: Never used  Substance and Sexual Activity  . Alcohol use: Yes    Comment: social  . Drug use: Not Currently    Types: Heroin  . Sexual activity: Yes    Birth control/protection: None  Other Topics Concern  . Not on file  Social History Narrative   Pt lives in [place] with [name] Graves and mother in [place]   Social Determinants of Health   Financial Resource Strain: Not on file  Food Insecurity: Not on file  Transportation Needs: Not on  file  Physical Activity: Not on file  Stress: Not on file  Social Connections: Not on file  Intimate Partner Violence: Not on file     PHYSICAL EXAM:  VS: LMP 08/14/2020  Physical Exam Gen: NAD, alert, cooperative with exam, well-appearing MSK:  ***      ASSESSMENT & PLAN:   No problem-specific Assessment & Plan notes found for this encounter.

## 2020-10-04 ENCOUNTER — Emergency Department (HOSPITAL_COMMUNITY): Payer: Self-pay

## 2020-10-04 ENCOUNTER — Other Ambulatory Visit: Payer: Self-pay

## 2020-10-04 ENCOUNTER — Emergency Department (HOSPITAL_COMMUNITY)
Admission: EM | Admit: 2020-10-04 | Discharge: 2020-10-05 | Disposition: A | Discharge status: Home / Self Care | Payer: Self-pay | Attending: Emergency Medicine | Admitting: Emergency Medicine

## 2020-10-04 ENCOUNTER — Encounter (HOSPITAL_COMMUNITY): Payer: Self-pay

## 2020-10-04 DIAGNOSIS — R079 Chest pain, unspecified: Secondary | ICD-10-CM | POA: Insufficient documentation

## 2020-10-04 DIAGNOSIS — Z5321 Procedure and treatment not carried out due to patient leaving prior to being seen by health care provider: Secondary | ICD-10-CM | POA: Insufficient documentation

## 2020-10-04 DIAGNOSIS — F10129 Alcohol abuse with intoxication, unspecified: Secondary | ICD-10-CM | POA: Insufficient documentation

## 2020-10-04 LAB — COMPREHENSIVE METABOLIC PANEL
ALT: 21 U/L (ref 0–44)
AST: 35 U/L (ref 15–41)
Albumin: 3.8 g/dL (ref 3.5–5.0)
Alkaline Phosphatase: 63 U/L (ref 38–126)
Anion gap: 9 (ref 5–15)
BUN: 13 mg/dL (ref 6–20)
CO2: 30 mmol/L (ref 22–32)
Calcium: 8.1 mg/dL — ABNORMAL LOW (ref 8.9–10.3)
Chloride: 107 mmol/L (ref 98–111)
Creatinine, Ser: 0.9 mg/dL (ref 0.44–1.00)
GFR, Estimated: >60 mL/min (ref >60)
Glucose, Bld: 82 mg/dL (ref 70–99)
Potassium: 3.7 mmol/L (ref 3.5–5.1)
Sodium: 146 mmol/L — ABNORMAL HIGH (ref 135–145)
Total Bilirubin: 0.3 mg/dL (ref 0.3–1.2)
Total Protein: 6.9 g/dL (ref 6.5–8.1)

## 2020-10-04 LAB — CBC WITH DIFFERENTIAL/PLATELET
Abs Immature Granulocytes: 0.02 10*3/uL (ref 0.00–0.07)
Basophils Absolute: 0.1 10*3/uL (ref 0.0–0.1)
Basophils Relative: 2 %
Eosinophils Absolute: 0 10*3/uL (ref 0.0–0.5)
Eosinophils Relative: 0 %
HCT: 41.6 % (ref 36.0–46.0)
Hemoglobin: 13.5 g/dL (ref 12.0–15.0)
Immature Granulocytes: 0 %
Lymphocytes Relative: 39 %
Lymphs Abs: 1.8 10*3/uL (ref 0.7–4.0)
MCH: 29.6 pg (ref 26.0–34.0)
MCHC: 32.5 g/dL (ref 30.0–36.0)
MCV: 91.2 fL (ref 80.0–100.0)
Monocytes Absolute: 0.3 10*3/uL (ref 0.1–1.0)
Monocytes Relative: 7 %
Neutro Abs: 2.3 10*3/uL (ref 1.7–7.7)
Neutrophils Relative %: 52 %
Platelets: 294 10*3/uL (ref 150–400)
RBC: 4.56 MIL/uL (ref 3.87–5.11)
RDW: 19 % — ABNORMAL HIGH (ref 11.5–15.5)
WBC: 4.6 10*3/uL (ref 4.0–10.5)
nRBC: 0 % (ref 0.0–0.2)

## 2020-10-04 LAB — RAPID URINE DRUG SCREEN, HOSP PERFORMED
Amphetamines: NOT DETECTED
Barbiturates: NOT DETECTED
Benzodiazepines: NOT DETECTED
Cocaine: NOT DETECTED
Opiates: NOT DETECTED
Tetrahydrocannabinol: NOT DETECTED

## 2020-10-04 LAB — TROPONIN I (HIGH SENSITIVITY): Troponin I (High Sensitivity): 3 ng/L (ref <18)

## 2020-10-04 LAB — LIPASE, BLOOD: Lipase: 25 U/L (ref 11–51)

## 2020-10-04 LAB — ETHANOL: Alcohol, Ethyl (B): 397 mg/dL (ref <10)

## 2020-10-04 NOTE — ED Notes (Signed)
Pt was seen getting into a vehicle.

## 2020-10-04 NOTE — ED Triage Notes (Signed)
Emergency Medicine Provider Triage Evaluation Note  Sheila Graves , a 40 y.o. female  was evaluated in triage.  Pt complains of etoh intoxication and chest pain.  Mid sternal and left sided chest pain, began 3 hours prior, no radiation.     Etoh use today, "I dont know a lot."  Daily drinker.  Denies any drug use.    Review of Systems  Positive: Chest pain Negative: Nausea, vomiting, shob, diaphoresis, cough   Physical Exam  BP 105/77 (BP Location: Left Arm)   Pulse 92   Temp 97.9 F (36.6 C) (Oral)   Resp 16   SpO2 99%  Gen:   Awake, no distress   HEENT:  Atraumatic  Resp:  Normal effort, lungs clear to auscultation bilaterall Cardiac:  Normal rate  MSK:   Moves extremities without difficulty  Neuro:  Speech clear   Medical Decision Making  Medically screening exam initiated at 8:05 PM.  Appropriate orders placed.  Kameran Lallier was informed that the remainder of the evaluation will be completed by another provider, this initial triage assessment does not replace that evaluation, and the importance of remaining in the ED until their evaluation is complete.  Clinical Impression   Chest pain, Etoh intoxication.  The patient appears stable so that the remainder of the work up may be completed by another provider.     Haskel Schroeder, [place] 10/04/20 2013

## 2020-10-04 NOTE — ED Triage Notes (Signed)
EMS reports p is from home. ETOH use for last 72hrs, cough x 1 day, centralized chest pain that started 3 hours ago, non radiating and tender to palpation. HR - 90, BP- 104/68, O2 sat 98% on RA, CBG - 140.

## 2020-10-04 NOTE — ED Notes (Signed)
Patient called for repeat lab work with no response. Chair where patient was sitting is now empty. Other patients in lobby observed patient leaving and getting into a car

## 2020-11-04 ENCOUNTER — Encounter (HOSPITAL_COMMUNITY): Payer: Self-pay

## 2020-11-04 ENCOUNTER — Emergency Department (HOSPITAL_COMMUNITY)
Admission: EM | Admit: 2020-11-04 | Discharge: 2020-11-05 | Disposition: A | Discharge status: Home / Self Care | Payer: Self-pay | Attending: Emergency Medicine | Admitting: Emergency Medicine

## 2020-11-04 DIAGNOSIS — Z87891 Personal history of nicotine dependence: Secondary | ICD-10-CM | POA: Insufficient documentation

## 2020-11-04 DIAGNOSIS — E876 Hypokalemia: Secondary | ICD-10-CM | POA: Insufficient documentation

## 2020-11-04 DIAGNOSIS — F10129 Alcohol abuse with intoxication, unspecified: Secondary | ICD-10-CM | POA: Insufficient documentation

## 2020-11-04 DIAGNOSIS — Y908 Blood alcohol level of 240 mg/100 ml or more: Secondary | ICD-10-CM | POA: Insufficient documentation

## 2020-11-04 DIAGNOSIS — D72819 Decreased white blood cell count, unspecified: Secondary | ICD-10-CM | POA: Insufficient documentation

## 2020-11-04 DIAGNOSIS — F10920 Alcohol use, unspecified with intoxication, uncomplicated: Secondary | ICD-10-CM

## 2020-11-04 LAB — COMPREHENSIVE METABOLIC PANEL
ALT: 36 U/L (ref 0–44)
AST: 42 U/L — ABNORMAL HIGH (ref 15–41)
Albumin: 4.4 g/dL (ref 3.5–5.0)
Alkaline Phosphatase: 90 U/L (ref 38–126)
Anion gap: 11 (ref 5–15)
BUN: 9 mg/dL (ref 6–20)
CO2: 33 mmol/L — ABNORMAL HIGH (ref 22–32)
Calcium: 9.6 mg/dL (ref 8.9–10.3)
Chloride: 95 mmol/L — ABNORMAL LOW (ref 98–111)
Creatinine, Ser: 0.5 mg/dL (ref 0.44–1.00)
GFR, Estimated: >60 mL/min (ref >60)
Glucose, Bld: 113 mg/dL — ABNORMAL HIGH (ref 70–99)
Potassium: 2.7 mmol/L — CL (ref 3.5–5.1)
Sodium: 139 mmol/L (ref 135–145)
Total Bilirubin: 0.6 mg/dL (ref 0.3–1.2)
Total Protein: 7.8 g/dL (ref 6.5–8.1)

## 2020-11-04 LAB — CBC WITH DIFFERENTIAL/PLATELET
Abs Immature Granulocytes: 0.02 10*3/uL (ref 0.00–0.07)
Basophils Absolute: 0 10*3/uL (ref 0.0–0.1)
Basophils Relative: 1 %
Eosinophils Absolute: 0 10*3/uL (ref 0.0–0.5)
Eosinophils Relative: 0 %
HCT: 40.6 % (ref 36.0–46.0)
Hemoglobin: 13.6 g/dL (ref 12.0–15.0)
Immature Granulocytes: 1 %
Lymphocytes Relative: 35 %
Lymphs Abs: 1.2 10*3/uL (ref 0.7–4.0)
MCH: 30 pg (ref 26.0–34.0)
MCHC: 33.5 g/dL (ref 30.0–36.0)
MCV: 89.6 fL (ref 80.0–100.0)
Monocytes Absolute: 0.3 10*3/uL (ref 0.1–1.0)
Monocytes Relative: 9 %
Neutro Abs: 1.9 10*3/uL (ref 1.7–7.7)
Neutrophils Relative %: 54 %
Platelets: 208 10*3/uL (ref 150–400)
RBC: 4.53 MIL/uL (ref 3.87–5.11)
RDW: 16.9 % — ABNORMAL HIGH (ref 11.5–15.5)
WBC: 3.4 10*3/uL — ABNORMAL LOW (ref 4.0–10.5)
nRBC: 0 % (ref 0.0–0.2)

## 2020-11-04 LAB — ETHANOL: Alcohol, Ethyl (B): 420 mg/dL (ref <10)

## 2020-11-04 MED ORDER — POTASSIUM CHLORIDE CRYS ER 20 MEQ PO TBCR: 20.0000 meq | EXTENDED_RELEASE_TABLET | Freq: Every day | ORAL | 0 refills | Status: DC

## 2020-11-04 MED ORDER — POTASSIUM CHLORIDE 10 MEQ/100ML IV SOLN
10.0000 meq | INTRAVENOUS | Status: DC
  Administered 2020-11-04: 10 meq via INTRAVENOUS
  Filled 2020-11-04 (×2): qty 100

## 2020-11-04 MED ORDER — THIAMINE HCL 100 MG/ML IJ SOLN
Freq: Once | INTRAVENOUS | Status: DC
  Filled 2020-11-04: qty 1000

## 2020-11-04 NOTE — ED Triage Notes (Signed)
Pt presents via [facility], reports drinking 2 bottles of vodka rapidly around 7pm. Wife states patient is a known alcoholic. Pt denies any SI/HI or any pain

## 2020-11-04 NOTE — ED Provider Notes (Signed)
[facility]-EMERGENCY DEPT Provider Note   CSN: [id number] Arrival date & time: 11/04/20  1930     History Chief Complaint  Patient presents with  . Alcohol Intoxication    Sheila Graves is a 40 y.o. female.  Patient brought in by EMS today.  She is a 40 year old female with a history of alcohol abuse and prior DTs who is presenting today with EMS after she drank 2 bottles of vodka rapidly around 7 PM.  Her significant other called EMS after this occurred.  She has a known history of alcohol abuse.  Unclear the reason for her rapid intake of alcohol.  No report of any injuries.  Patient does appear intoxicated and is not able to answer many questions.  The history is provided by the EMS personnel and a relative. The history is limited by the condition of the patient.  Alcohol Intoxication This is a recurrent problem.       Past Medical History:  Diagnosis Date  . Alcohol withdrawal seizure (HCC)   . Alcoholic (HCC)   . ETOH abuse   . Hypokalemia   . Pancreatitis   . Thyroid disease     Patient Active Problem List   Diagnosis Date Noted  . Hypophosphatemia 07/31/2019  . Alcohol use disorder, severe, dependence (HCC)   . Prolonged QT interval 03/16/2019  . Hypokalemia 02/04/2019  . 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 FINGER SURGERY    . IRRIGATION AND DEBRIDEMENT SHOULDER Right 11/12/2018   Procedure: Irrigation And Debridement Shoulder;  Surgeon: Roby Lofts, MD;  Location: [facility] OR;  Service: Orthopedics;  Laterality: Right;  . ORIF CLAVICULAR FRACTURE Right 11/12/2018   Procedure: OPEN REDUCTION INTERNAL FIXATION (ORIF) CLAVICULAR FRACTURE;  Surgeon: Roby Lofts, MD;  Location: [facility] OR;  Service: Orthopedics;  Laterality: Right;     OB History   No obstetric history on file.     Family History  Problem Relation Age of Onset  .  Alcohol abuse Other     Social History   Tobacco Use  . Smoking status: Former Smoker    Packs/day: 1.00  . Smokeless tobacco: Never Used  Vaping Use  . Vaping Use: Never used  Substance Use Topics  . Alcohol use: Yes    Comment: social  . Drug use: Not Currently    Types: Heroin    Home Medications Prior to Admission medications   Medication Sig Start Date End Date Taking? Authorizing Provider  naproxen (NAPROSYN) 500 MG tablet Take 1 tablet (500 mg total) by mouth 2 (two) times daily. 09/03/20   Linwood Dibbles, MD  potassium chloride (KLOR-CON) 10 MEQ tablet Take 2 tablets (20 mEq total) by mouth daily for 14 days. 03/01/20 03/15/20  Milagros Loll, MD  escitalopram (LEXAPRO) 10 MG tablet Take 1 tablet (10 mg total) by mouth daily. Patient not taking: Reported on 09/16/2019 03/22/19 11/16/19  Eddie North, MD    Allergies    Patient has no known allergies.  Review of Systems   Review of Systems  Unable to perform ROS: Mental status change    Physical Exam Updated Vital Signs BP 112/81   Pulse 81   Temp (!) 97.3 F (36.3 C)   Resp 19   LMP  (LMP Unknown)   SpO2 99%   Physical Exam Vitals and nursing note reviewed.  Constitutional:      General: She  is not in acute distress.    Appearance: She is well-developed.     Comments: Sleeping on exam but does wake to voice.  Appears intoxicated.  HENT:     Head: Normocephalic and atraumatic.  Eyes:     Pupils: Pupils are equal, round, and reactive to light.  Cardiovascular:     Rate and Rhythm: Normal rate and regular rhythm.     Heart sounds: Normal heart sounds. No murmur heard. No friction rub.  Pulmonary:     Effort: Pulmonary effort is normal.     Breath sounds: Normal breath sounds. No wheezing or rales.  Abdominal:     General: Bowel sounds are normal. There is no distension.     Palpations: Abdomen is soft.     Tenderness: There is no abdominal tenderness. There is no guarding or rebound.   Musculoskeletal:        General: No tenderness. Normal range of motion.     Comments: No edema.  No evidence of trauma  Skin:    General: Skin is warm and dry.     Findings: No rash.  Neurological:     Comments: Noted to move all extremities.  Psychiatric:     Comments: Appears intoxicated with slowed, slurred speech and slow reaction time     ED Results / Procedures / Treatments   Labs (all labs ordered are listed, but only abnormal results are displayed) Labs Reviewed  ETHANOL - Abnormal; Notable for the following components:      Result Value   Alcohol, Ethyl (B) 420 (*)    All other components within normal limits  COMPREHENSIVE METABOLIC PANEL - Abnormal; Notable for the following components:   Potassium 2.7 (*)    Chloride 95 (*)    CO2 33 (*)    Glucose, Bld 113 (*)    AST 42 (*)    All other components within normal limits  CBC WITH DIFFERENTIAL/PLATELET - Abnormal; Notable for the following components:   WBC 3.4 (*)    RDW 16.9 (*)    All other components within normal limits    EKG None  Radiology No results found.  Procedures Procedures   Medications Ordered in ED Medications - No data to display  ED Course  I have reviewed the triage vital signs and the nursing notes.  Pertinent labs & imaging results that were available during my care of the patient were reviewed by me and considered in my medical decision making (see chart for details).    MDM Rules/Calculators/A&P                          40 year old female presenting today due to acute alcohol intake.  Significant other reported 2 bottles of vodka around 7:00.  Patient appears significantly intoxicated at this time.  There was no report of injury.  Patient vital signs are currently stable.  We will continue to monitor to ensure no acute respiratory failure.  Labs pending.  10:12 PM Alcohol is elevated at 420.  Also CMP with hypokalemia of 2.7 and normal renal function.  CBC with leukopenia  of 3.4 but o/w wnl.  Pt given IV potassium.  Will require time to sober up.  MDM Number of Diagnoses or Management Options   Amount and/or Complexity of Data Reviewed Clinical lab tests: ordered and reviewed Independent visualization of images, tracings, or specimens: yes     Final Clinical Impression(s) / ED Diagnoses Final diagnoses:  Alcoholic intoxication without complication (HCC)  Hypokalemia    Rx / DC Orders ED Discharge Orders    None       Gwyneth Sprout, MD 11/04/20 2214

## 2020-11-04 NOTE — ED Notes (Signed)
Patient was able to ambulate and sprint down the hall with no problems

## 2020-11-04 NOTE — ED Provider Notes (Signed)
I assumed care in signout to follow-up on patient after be given potassium.  Patient did receive 1 round of potassium, is now requesting discharge patient is awake and alert and is ambulatory.  She reports she has to go home to take care of her sick mother-in-law. She has a ride home.  She is requesting a prescription for potassium as she has had this previously.   Zadie Rhine, MD 11/04/20 2337

## 2020-11-05 NOTE — ED Notes (Signed)
Pt repeatedly calling out and demanding to talk to her wife. Called wife and transferred call into room. Pt then continued to scream out and stating she wants to leave and has called her dad to get her an uber. Educated patient on reason for staying to replace potassium and metabolize alcohol. Pt states she does not want to stay and demanding to leave.

## 2021-01-28 ENCOUNTER — Other Ambulatory Visit: Payer: Self-pay

## 2021-01-28 ENCOUNTER — Encounter (HOSPITAL_COMMUNITY): Payer: Self-pay | Admitting: *Deleted

## 2021-01-28 ENCOUNTER — Emergency Department (HOSPITAL_COMMUNITY): Payer: Self-pay

## 2021-01-28 ENCOUNTER — Emergency Department (HOSPITAL_COMMUNITY)
Admission: EM | Admit: 2021-01-28 | Discharge: 2021-01-28 | Disposition: A | Discharge status: Home / Self Care | Payer: Self-pay | Attending: Emergency Medicine | Admitting: Emergency Medicine

## 2021-01-28 DIAGNOSIS — Y990 Civilian activity done for income or pay: Secondary | ICD-10-CM | POA: Insufficient documentation

## 2021-01-28 DIAGNOSIS — Z87891 Personal history of nicotine dependence: Secondary | ICD-10-CM | POA: Insufficient documentation

## 2021-01-28 DIAGNOSIS — M25511 Pain in right shoulder: Secondary | ICD-10-CM | POA: Insufficient documentation

## 2021-01-28 DIAGNOSIS — X500XXA Overexertion from strenuous movement or load, initial encounter: Secondary | ICD-10-CM | POA: Insufficient documentation

## 2021-01-28 MED ORDER — LIDOCAINE 5 % EX PTCH
1.0000 | MEDICATED_PATCH | CUTANEOUS | Status: DC
  Administered 2021-01-28: 1 via TRANSDERMAL
  Filled 2021-01-28: qty 1

## 2021-01-28 MED ORDER — KETOROLAC TROMETHAMINE 30 MG/ML IJ SOLN: 30.0000 mg | Freq: Once | INTRAMUSCULAR | Status: DC

## 2021-01-28 MED ORDER — KETOROLAC TROMETHAMINE 30 MG/ML IJ SOLN
30.0000 mg | Freq: Once | INTRAMUSCULAR | Status: AC
  Administered 2021-01-28: 30 mg via INTRAMUSCULAR
  Filled 2021-01-28: qty 1

## 2021-01-28 NOTE — Discharge Instructions (Addendum)
Take Tylenol and ibuprofen as needed for pain  Call either [facility] or Dr. Mort Sawyers office to set up an appointment for follow-up in the next few weeks with Ortho surgery.

## 2021-01-28 NOTE — ED Provider Notes (Signed)
[facility] EMERGENCY DEPARTMENT Provider Note   CSN: [id number] Arrival date & time: 01/28/21  1223     History Chief Complaint  Patient presents with   Shoulder Pain    Sheila Graves is a 40 y.o. female.   Shoulder Pain  Patient presents with right shoulder pain.  This happened while lifting a heavy bag of close at her work to [employer] over her shoulder.  The pain is been constant since then, worse with movement and heavy lifting.  She has tried Tylenol with minimal relief.  History of an injury to the same site before, was supposed to follow-up with a physical therapist but never did.  Past Medical History:  Diagnosis Date   Alcohol withdrawal seizure (HCC)    Alcoholic (HCC)    ETOH abuse    Hypokalemia    Pancreatitis    Thyroid disease     Patient Active Problem List   Diagnosis Date Noted   Hypophosphatemia 07/31/2019   Alcohol use disorder, severe, dependence (HCC)    Prolonged QT interval 03/16/2019   Hypokalemia 02/04/2019   Major depressive disorder, recurrent, severe without psychotic features (HCC) 01/31/2018    Past Surgical History:  Procedure Laterality Date   FINGER SURGERY     IRRIGATION AND DEBRIDEMENT SHOULDER Right 11/12/2018   Procedure: Irrigation And Debridement Shoulder;  Surgeon: Roby Lofts, MD;  Location: [facility] OR;  Service: Orthopedics;  Laterality: Right;   ORIF CLAVICULAR FRACTURE Right 11/12/2018   Procedure: OPEN REDUCTION INTERNAL FIXATION (ORIF) CLAVICULAR FRACTURE;  Surgeon: Roby Lofts, MD;  Location: [facility] OR;  Service: Orthopedics;  Laterality: Right;     OB History   No obstetric history on file.     Family History  Problem Relation Age of Onset   Alcohol abuse Other     Social History   Tobacco Use   Smoking status: Former    Packs/day: 1.00    Types: Cigarettes   Smokeless tobacco: Never  Vaping Use   Vaping Use: Never used  Substance Use Topics   Alcohol use: Yes    Comment: social   Drug use: Not  Currently    Types: Heroin    Home Medications Prior to Admission medications   Medication Sig Start Date End Date Taking? Authorizing Provider  naproxen (NAPROSYN) 500 MG tablet Take 1 tablet (500 mg total) by mouth 2 (two) times daily. 09/03/20   Linwood Dibbles, MD  potassium chloride (KLOR-CON) 10 MEQ tablet Take 2 tablets (20 mEq total) by mouth daily for 14 days. 03/01/20 03/15/20  Milagros Loll, MD  potassium chloride SA (KLOR-CON) 20 MEQ tablet Take 1 tablet (20 mEq total) by mouth daily. 11/04/20   Zadie Rhine, MD  escitalopram (LEXAPRO) 10 MG tablet Take 1 tablet (10 mg total) by mouth daily. Patient not taking: Reported on 09/16/2019 03/22/19 11/16/19  Eddie North, MD    Allergies    Patient has no known allergies.  Review of Systems   Review of Systems  Musculoskeletal:        Shoulder pain   Physical Exam Updated Vital Signs BP 107/60 (BP Location: Left Arm)   Pulse 95   Temp 97.9 F (36.6 C) (Oral)   Resp 18   LMP 01/16/2021   SpO2 95%   Physical Exam Vitals and nursing note reviewed. Exam conducted with a chaperone present.  Constitutional:      General: She is not in acute distress.    Appearance: Normal appearance.  HENT:  Head: Normocephalic and atraumatic.  Eyes:     General: No scleral icterus.    Extraocular Movements: Extraocular movements intact.     Pupils: Pupils are equal, round, and reactive to light.  Cardiovascular:     Comments: Radial pulses 2+ bilaterally Musculoskeletal:        General: Tenderness present.     Comments: Patient has some tenderness across the acromion and the [facility] joint.  Range of motion to right shoulder is intact, although it does elicit pain.  Skin:    Coloration: Skin is not jaundiced.  Neurological:     Mental Status: She is alert. Mental status is at baseline.     Coordination: Coordination normal.   ED Results / Procedures / Treatments   Labs (all labs ordered are listed, but only abnormal results are  displayed) Labs Reviewed - No data to display  EKG None  Radiology No results found.  Procedures Procedures   Medications Ordered in ED Medications - No data to display  ED Course  I have reviewed the triage vital signs and the nursing notes.  Pertinent labs & imaging results that were available during my care of the patient were reviewed by me and considered in my medical decision making (see chart for details).    MDM Rules/Calculators/A&P                           No focal deficits on exam, no midline bony tenderness.  Patient is ambulatory.  She has range of motion, although she does have some subjective pain.  She is neurovascularly intact.  Radiograph shows some degenerative changes, but no acute fractures or dislocations.  We will have her follow-up with orthopedics and given 5 days of pain medication.  Her vitals are stable, she is appropriate for discharge at this time with follow-up instructions.  Final Clinical Impression(s) / ED Diagnoses Final diagnoses:  None    Rx / DC Orders ED Discharge Orders     None        Theron Arista, Cordelia Poche 01/28/21 1605    Jacalyn Lefevre, MD 01/29/21 [phone]

## 2021-01-28 NOTE — ED Triage Notes (Signed)
Right shoulder pain onset yesterday

## 2021-01-29 ENCOUNTER — Other Ambulatory Visit (HOSPITAL_COMMUNITY): Payer: Self-pay | Admitting: Specialist

## 2021-01-29 DIAGNOSIS — M25511 Pain in right shoulder: Secondary | ICD-10-CM

## 2021-02-04 ENCOUNTER — Other Ambulatory Visit: Payer: Self-pay | Admitting: Specialist

## 2021-02-04 DIAGNOSIS — M25511 Pain in right shoulder: Secondary | ICD-10-CM

## 2021-02-10 ENCOUNTER — Other Ambulatory Visit: Payer: Self-pay

## 2021-02-10 ENCOUNTER — Encounter (HOSPITAL_BASED_OUTPATIENT_CLINIC_OR_DEPARTMENT_OTHER): Payer: Self-pay | Admitting: *Deleted

## 2021-02-10 ENCOUNTER — Emergency Department (HOSPITAL_BASED_OUTPATIENT_CLINIC_OR_DEPARTMENT_OTHER)
Admission: EM | Admit: 2021-02-10 | Discharge: 2021-02-11 | Disposition: A | Discharge status: Home / Self Care | Payer: Self-pay | Attending: Emergency Medicine | Admitting: Emergency Medicine

## 2021-02-10 DIAGNOSIS — F10920 Alcohol use, unspecified with intoxication, uncomplicated: Secondary | ICD-10-CM

## 2021-02-10 DIAGNOSIS — R112 Nausea with vomiting, unspecified: Secondary | ICD-10-CM | POA: Insufficient documentation

## 2021-02-10 DIAGNOSIS — Z87891 Personal history of nicotine dependence: Secondary | ICD-10-CM | POA: Insufficient documentation

## 2021-02-10 DIAGNOSIS — F10129 Alcohol abuse with intoxication, unspecified: Secondary | ICD-10-CM | POA: Insufficient documentation

## 2021-02-10 MED ORDER — ONDANSETRON 4 MG PO TBDP
4.0000 mg | ORAL_TABLET | Freq: Once | ORAL | Status: AC
  Administered 2021-02-10: 4 mg via ORAL
  Filled 2021-02-10: qty 1

## 2021-02-10 NOTE — ED Triage Notes (Signed)
BIB EMS due to ETOH and sleeping in the grass at [employer], concerned citizens called EMS. Pt arrived then immediately urinated in her pants in triage.

## 2021-02-10 NOTE — ED Notes (Signed)
Had to tell patient to lower voice as she was screaming and yelling at someone on the phone.  Patient complied

## 2021-02-10 NOTE — ED Notes (Signed)
Patient moved to waiting area in site of Nurse's Station and urinated in wheel chair and floor.  Patient within sight of nurses.

## 2021-02-10 NOTE — ED Notes (Signed)
Patient was provided with emesis bag and still vomited in the floor.

## 2021-02-10 NOTE — ED Provider Notes (Signed)
  Provider Note MRN:  [id number]  Arrival date & time: 02/10/21    ED Course and Medical Decision Making  Assumed care from Dr. Rush Landmark at shift change.  Left rehab today, drank a lot of alcohol, here with some nausea vomiting.  Plan is to try to get social work to talk to her about options, reassess.  Patient is clinically sober, fully conversant, appropriate for discharge, will have case manager social worker reach out to her in the morning.  Procedures  Final Clinical Impressions(s) / ED Diagnoses     ICD-10-CM   1. Alcoholic intoxication without complication [facility])  C62.376       ED Discharge Orders     None       Discharge Instructions   None     Elmer Sow. Pilar Plate, MD [facility] Emergency Medicine [facility] [email or web address]    Sabas Sous, MD 02/11/21 0040

## 2021-02-10 NOTE — ED Notes (Signed)
Patient still sleeping in wheelchair.  NAD noted.

## 2021-02-10 NOTE — ED Provider Notes (Signed)
[facility] EMERGENCY DEPT Provider Note   CSN: [id number] Arrival date & time: 02/10/21  1738     History Chief Complaint  Patient presents with  . Alcohol Intoxication    Sheila Graves is a 40 y.o. female.  The history is provided by the patient and medical records. The history is limited by the condition of the patient.  Alcohol Intoxication This is a recurrent problem. The problem occurs constantly. The problem has not changed since onset.Pertinent negatives include no chest pain, no abdominal pain, no headaches and no shortness of breath. Nothing aggravates the symptoms. Nothing relieves the symptoms. She has tried nothing for the symptoms. The treatment provided no relief.      Past Medical History:  Diagnosis Date  . Alcohol withdrawal seizure (HCC)   . Alcoholic (HCC)   . ETOH abuse   . Hypokalemia   . Pancreatitis   . Thyroid disease     Patient Active Problem List   Diagnosis Date Noted  . Hypophosphatemia 07/31/2019  . Alcohol use disorder, severe, dependence (HCC)   . Prolonged QT interval 03/16/2019  . Hypokalemia 02/04/2019  . 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 FINGER SURGERY    . IRRIGATION AND DEBRIDEMENT SHOULDER Right 11/12/2018   Procedure: Irrigation And Debridement Shoulder;  Surgeon: Roby Lofts, MD;  Location: [facility] OR;  Service: Orthopedics;  Laterality: Right;  . ORIF CLAVICULAR FRACTURE Right 11/12/2018   Procedure: OPEN REDUCTION INTERNAL FIXATION (ORIF) CLAVICULAR FRACTURE;  Surgeon: Roby Lofts, MD;  Location: [facility] OR;  Service: Orthopedics;  Laterality: Right;     OB History   No obstetric history on file.     Family History  Problem Relation Age of Onset  . Alcohol abuse Other     Social History   Tobacco Use  . Smoking status: Former    Packs/day: 1.00    Types: Cigarettes  . Smokeless tobacco:  Never  Vaping Use  . Vaping Use: Never used  Substance Use Topics  . Alcohol use: Yes  . Drug use: Not Currently    Types: Heroin    Home Medications Prior to Admission medications   Medication Sig Start Date End Date Taking? Authorizing Provider  naproxen (NAPROSYN) 500 MG tablet Take 1 tablet (500 mg total) by mouth 2 (two) times daily. 09/03/20   Linwood Dibbles, MD  potassium chloride (KLOR-CON) 10 MEQ tablet Take 2 tablets (20 mEq total) by mouth daily for 14 days. 03/01/20 03/15/20  Milagros Loll, MD  potassium chloride SA (KLOR-CON) 20 MEQ tablet Take 1 tablet (20 mEq total) by mouth daily. 11/04/20   Zadie Rhine, MD  escitalopram (LEXAPRO) 10 MG tablet Take 1 tablet (10 mg total) by mouth daily. Patient not taking: Reported on 09/16/2019 03/22/19 11/16/19  Eddie North, MD    Allergies    Patient has no known allergies.  Review of Systems   Review of Systems  Constitutional:  Negative for chills, diaphoresis, fatigue and fever.  HENT:  Negative for congestion.   Eyes:  Negative for visual disturbance.  Respiratory:  Negative for chest tightness and shortness of breath.   Cardiovascular:  Negative for chest pain.  Gastrointestinal:  Positive for nausea and vomiting. Negative for abdominal pain, constipation and diarrhea.  Genitourinary:  Negative for dysuria.  Musculoskeletal:  Negative for back pain, neck pain and neck stiffness.  Neurological:  Negative for light-headedness, numbness and  headaches.  Psychiatric/Behavioral:  Negative for agitation and confusion.   All other systems reviewed and are negative.  Physical Exam Updated Vital Signs BP 115/84 (BP Location: Right Arm)   Pulse 99   Temp 98.3 F (36.8 C)   Resp 18   LMP 01/16/2021   SpO2 99%   Physical Exam Vitals and nursing note reviewed.  Constitutional:      General: She is not in acute distress.    Appearance: She is well-developed. She is not ill-appearing, toxic-appearing or diaphoretic.  HENT:      Head: Normocephalic and atraumatic.     Nose: No congestion or rhinorrhea.     Mouth/Throat:     Mouth: Mucous membranes are moist.  Eyes:     Extraocular Movements: Extraocular movements intact.     Conjunctiva/sclera: Conjunctivae normal.     Pupils: Pupils are equal, round, and reactive to light.  Cardiovascular:     Rate and Rhythm: Normal rate and regular rhythm.     Heart sounds: No murmur heard. Pulmonary:     Effort: Pulmonary effort is normal. No respiratory distress.     Breath sounds: Normal breath sounds. No wheezing, rhonchi or rales.  Chest:     Chest wall: No tenderness.  Abdominal:     General: Abdomen is flat.     Palpations: Abdomen is soft.     Tenderness: There is no abdominal tenderness. There is no guarding or rebound.  Musculoskeletal:        General: Tenderness present.     Cervical back: Neck supple.  Skin:    General: Skin is warm and dry.     Findings: No erythema.  Neurological:     General: No focal deficit present.     Mental Status: She is alert.     Sensory: No sensory deficit.     Motor: No weakness.  Psychiatric:        Mood and Affect: Mood normal.    ED Results / Procedures / Treatments   Labs (all labs ordered are listed, but only abnormal results are displayed) Labs Reviewed - No data to display  EKG EKG Interpretation  Date/Time:  Monday February 10 2021 23:27:24 EDT Ventricular Rate:  91 PR Interval:  125 QRS Duration: 98 QT Interval:  377 QTC Calculation: 464 R Axis:   81 Text Interpretation: Sinus rhythm Borderline T wave abnormalities Confirmed by Kennis Carina [phone]) on 02/10/2021 11:37:35 PM  Radiology No results found.  Procedures Procedures   Medications Ordered in ED Medications  ondansetron (ZOFRAN-ODT) disintegrating tablet 4 mg (4 mg Oral Given 02/10/21 2038)  ondansetron (ZOFRAN-ODT) disintegrating tablet 4 mg (4 mg Oral Given 02/10/21 2342)    ED Course  I have reviewed the triage vital signs and  the nursing notes.  Pertinent labs & imaging results that were available during my care of the patient were reviewed by me and considered in my medical decision making (see chart for details).    MDM Rules/Calculators/A&P                           Emorie Mcfate is a 40 y.o. female with a past medical history significant for previous pancreatitis, alcohol abuse, previous prolonged QT interval, and thyroid disease who presents for suspected intoxication.  According to EMS report, patient was found passed out in a field next to a [employer] and concern citizens called EMS.  On initial evaluation, patient does appear intoxicated  smelling of alcohol and having nausea and vomiting in the emergency department.  Vital signs initially reassuring but patient is clearly intoxicated.  Decision made to allow patient to metabolize more and have further discussion with her.  After several hours, patient was reassessed and she was still having nausea and vomiting despite some Zofran earlier.  EKG was performed and she did not have a prolonged QT so we will give her more Zofran.  Patient reports that she has spent 2 months at [facility] for alcohol abuse treatment and was doing well until she checked herself out this morning to take care of some family issues.  Patient says after checking out she immediately started drinking wine and got very intoxicated.  Patient otherwise is only reporting some chronic right shoulder pain from previous injury but did not has new changes there.  She denies any other physical concerns including no recent fevers, chills, cough, constipation, diarrhea, or urinary changes.  She is reporting nausea from her intoxication.  On exam, lungs are clear and chest is nontender.  Abdomen is nontender.  Right shoulder is tender at her baseline.  No focal neurologic deficits but she does appear still intoxicated.  Patient denies suicidal ideation or homicidal ideation or hallucinations.  She  reports that she wants to speak with case management and social work to try and figure out how she can get back into inpatient alcohol treatment.     we will allow her to continue to metabolize and have some nausea medicine and p.o. challenge.  We will put a consult for case management/social work to discuss further options for her as she just left the inpatient facility today.  Care transferred to oncoming team while awaiting reassessment.  Final Clinical Impression(s) / ED Diagnoses Final diagnoses:  Alcoholic intoxication without complication (HCC)   Clinical Impression: 1. Alcoholic intoxication without complication (HCC)     Disposition: Care transferred to oncoming team while awaiting reassessment.  This note was prepared with assistance of Conservation officer, historic buildings. Occasional wrong-word or sound-a-like substitutions may have occurred due to the inherent limitations of voice recognition software.     Lenville Hibberd, Canary Brim, MD 02/11/21 [phone]

## 2021-02-10 NOTE — ED Notes (Addendum)
Patient awake and states "not feeling well". Patient ambulatory to bathroom to change out of urinated saturated clothes.  Also vomited on the floor.

## 2021-02-11 ENCOUNTER — Other Ambulatory Visit: Payer: Self-pay

## 2021-02-11 ENCOUNTER — Encounter (HOSPITAL_BASED_OUTPATIENT_CLINIC_OR_DEPARTMENT_OTHER): Payer: Self-pay

## 2021-02-11 ENCOUNTER — Emergency Department (HOSPITAL_BASED_OUTPATIENT_CLINIC_OR_DEPARTMENT_OTHER)
Admission: EM | Admit: 2021-02-11 | Discharge: 2021-02-12 | Disposition: A | Discharge status: Home / Self Care | Payer: Self-pay | Attending: Emergency Medicine | Admitting: Emergency Medicine

## 2021-02-11 ENCOUNTER — Emergency Department (HOSPITAL_BASED_OUTPATIENT_CLINIC_OR_DEPARTMENT_OTHER)
Admission: EM | Admit: 2021-02-11 | Discharge: 2021-02-11 | Disposition: A | Discharge status: Home / Self Care | Payer: Self-pay | Attending: Emergency Medicine | Admitting: Emergency Medicine

## 2021-02-11 DIAGNOSIS — F1010 Alcohol abuse, uncomplicated: Secondary | ICD-10-CM

## 2021-02-11 DIAGNOSIS — F10920 Alcohol use, unspecified with intoxication, uncomplicated: Secondary | ICD-10-CM

## 2021-02-11 DIAGNOSIS — F10129 Alcohol abuse with intoxication, unspecified: Secondary | ICD-10-CM | POA: Insufficient documentation

## 2021-02-11 DIAGNOSIS — E876 Hypokalemia: Secondary | ICD-10-CM | POA: Insufficient documentation

## 2021-02-11 DIAGNOSIS — R110 Nausea: Secondary | ICD-10-CM

## 2021-02-11 DIAGNOSIS — Z87891 Personal history of nicotine dependence: Secondary | ICD-10-CM | POA: Insufficient documentation

## 2021-02-11 DIAGNOSIS — R112 Nausea with vomiting, unspecified: Secondary | ICD-10-CM | POA: Insufficient documentation

## 2021-02-11 DIAGNOSIS — Y908 Blood alcohol level of 240 mg/100 ml or more: Secondary | ICD-10-CM | POA: Insufficient documentation

## 2021-02-11 DIAGNOSIS — R000 Tachycardia, unspecified: Secondary | ICD-10-CM | POA: Insufficient documentation

## 2021-02-11 LAB — CBC WITH DIFFERENTIAL/PLATELET
Abs Immature Granulocytes: 0.01 10*3/uL (ref 0.00–0.07)
Abs Immature Granulocytes: 0.01 10*3/uL (ref 0.00–0.07)
Basophils Absolute: 0 10*3/uL (ref 0.0–0.1)
Basophils Absolute: 0 10*3/uL (ref 0.0–0.1)
Basophils Relative: 0 %
Basophils Relative: 0 %
Eosinophils Absolute: 0 10*3/uL (ref 0.0–0.5)
Eosinophils Absolute: 0.2 10*3/uL (ref 0.0–0.5)
Eosinophils Relative: 0 %
Eosinophils Relative: 2 %
HCT: 45.4 % (ref 36.0–46.0)
HCT: 45.8 % (ref 36.0–46.0)
Hemoglobin: 15.3 g/dL — ABNORMAL HIGH (ref 12.0–15.0)
Hemoglobin: 15.4 g/dL — ABNORMAL HIGH (ref 12.0–15.0)
Immature Granulocytes: 0 %
Immature Granulocytes: 0 %
Lymphocytes Relative: 26 %
Lymphocytes Relative: 43 %
Lymphs Abs: 1.2 10*3/uL (ref 0.7–4.0)
Lymphs Abs: 3.2 10*3/uL (ref 0.7–4.0)
MCH: 29 pg (ref 26.0–34.0)
MCH: 28.9 pg (ref 26.0–34.0)
MCHC: 33.7 g/dL (ref 30.0–36.0)
MCHC: 33.6 g/dL (ref 30.0–36.0)
MCV: 86.1 fL (ref 80.0–100.0)
MCV: 85.9 fL (ref 80.0–100.0)
Monocytes Absolute: 0.5 10*3/uL (ref 0.1–1.0)
Monocytes Absolute: 0.6 10*3/uL (ref 0.1–1.0)
Monocytes Relative: 10 %
Monocytes Relative: 8 %
Neutro Abs: 2.9 10*3/uL (ref 1.7–7.7)
Neutro Abs: 3.5 10*3/uL (ref 1.7–7.7)
Neutrophils Relative %: 64 %
Neutrophils Relative %: 47 %
Platelets: 270 10*3/uL (ref 150–400)
Platelets: 280 10*3/uL (ref 150–400)
RBC: 5.27 MIL/uL — ABNORMAL HIGH (ref 3.87–5.11)
RBC: 5.33 MIL/uL — ABNORMAL HIGH (ref 3.87–5.11)
RDW: 13.8 % (ref 11.5–15.5)
RDW: 13.9 % (ref 11.5–15.5)
WBC: 4.6 10*3/uL (ref 4.0–10.5)
WBC: 7.5 10*3/uL (ref 4.0–10.5)
nRBC: 0 % (ref 0.0–0.2)
nRBC: 0 % (ref 0.0–0.2)

## 2021-02-11 LAB — URINALYSIS, ROUTINE W REFLEX MICROSCOPIC
Bilirubin Urine: NEGATIVE
Glucose, UA: NEGATIVE mg/dL
Hgb urine dipstick: NEGATIVE
Ketones, ur: NEGATIVE mg/dL
Leukocytes,Ua: NEGATIVE
Nitrite: NEGATIVE
Protein, ur: 30 mg/dL — AB
Specific Gravity, Urine: 1.023 (ref 1.005–1.030)
pH: 8.5 — ABNORMAL HIGH (ref 5.0–8.0)

## 2021-02-11 LAB — BASIC METABOLIC PANEL
Anion gap: 17 — ABNORMAL HIGH (ref 5–15)
BUN: 19 mg/dL (ref 6–20)
CO2: 33 mmol/L — ABNORMAL HIGH (ref 22–32)
Calcium: 9.4 mg/dL (ref 8.9–10.3)
Chloride: 87 mmol/L — ABNORMAL LOW (ref 98–111)
Creatinine, Ser: 1.01 mg/dL — ABNORMAL HIGH (ref 0.44–1.00)
GFR, Estimated: >60 mL/min (ref >60)
Glucose, Bld: 93 mg/dL (ref 70–99)
Potassium: 2.6 mmol/L — CL (ref 3.5–5.1)
Sodium: 137 mmol/L (ref 135–145)

## 2021-02-11 LAB — COMPREHENSIVE METABOLIC PANEL
ALT: 18 U/L (ref 0–44)
AST: 22 U/L (ref 15–41)
Albumin: 5 g/dL (ref 3.5–5.0)
Alkaline Phosphatase: 73 U/L (ref 38–126)
Anion gap: 16 — ABNORMAL HIGH (ref 5–15)
BUN: 18 mg/dL (ref 6–20)
CO2: 35 mmol/L — ABNORMAL HIGH (ref 22–32)
Calcium: 10 mg/dL (ref 8.9–10.3)
Chloride: 88 mmol/L — ABNORMAL LOW (ref 98–111)
Creatinine, Ser: 0.93 mg/dL (ref 0.44–1.00)
GFR, Estimated: >60 mL/min (ref >60)
Glucose, Bld: 98 mg/dL (ref 70–99)
Potassium: 2.7 mmol/L — CL (ref 3.5–5.1)
Sodium: 139 mmol/L (ref 135–145)
Total Bilirubin: 0.7 mg/dL (ref 0.3–1.2)
Total Protein: 8.4 g/dL — ABNORMAL HIGH (ref 6.5–8.1)

## 2021-02-11 LAB — MAGNESIUM
Magnesium: 1.7 mg/dL (ref 1.7–2.4)
Magnesium: 2.5 mg/dL — ABNORMAL HIGH (ref 1.7–2.4)

## 2021-02-11 LAB — CBG MONITORING, ED: Glucose-Capillary: 98 mg/dL (ref 70–99)

## 2021-02-11 LAB — PREGNANCY, URINE: Preg Test, Ur: NEGATIVE

## 2021-02-11 LAB — ETHANOL: Alcohol, Ethyl (B): 323 mg/dL (ref <10)

## 2021-02-11 MED ORDER — LORAZEPAM 1 MG PO TABS
1.0000 mg | ORAL_TABLET | Freq: Once | ORAL | Status: AC
  Administered 2021-02-11: 1 mg via ORAL
  Filled 2021-02-11: qty 1

## 2021-02-11 MED ORDER — POTASSIUM CHLORIDE 10 MEQ/100ML IV SOLN
10.0000 meq | INTRAVENOUS | Status: AC
  Administered 2021-02-11 (×2): 10 meq via INTRAVENOUS
  Filled 2021-02-11 (×2): qty 100

## 2021-02-11 MED ORDER — SODIUM CHLORIDE 0.9 % IV BOLUS
1000.0000 mL | Freq: Once | INTRAVENOUS | Status: AC
  Administered 2021-02-11: 1000 mL via INTRAVENOUS

## 2021-02-11 MED ORDER — POTASSIUM CHLORIDE ER 10 MEQ PO TBCR: 20.0000 meq | EXTENDED_RELEASE_TABLET | Freq: Every day | ORAL | 0 refills | Status: DC

## 2021-02-11 MED ORDER — POTASSIUM CHLORIDE CRYS ER 20 MEQ PO TBCR
40.0000 meq | EXTENDED_RELEASE_TABLET | Freq: Once | ORAL | Status: AC
  Administered 2021-02-11: 40 meq via ORAL
  Filled 2021-02-11: qty 2

## 2021-02-11 MED ORDER — SODIUM CHLORIDE 0.9 % IV BOLUS
500.0000 mL | Freq: Once | INTRAVENOUS | Status: AC
  Administered 2021-02-11: 500 mL via INTRAVENOUS

## 2021-02-11 MED ORDER — ONDANSETRON HCL 4 MG/2ML IJ SOLN
4.0000 mg | Freq: Once | INTRAMUSCULAR | Status: AC
  Administered 2021-02-11: 4 mg via INTRAVENOUS
  Filled 2021-02-11: qty 2

## 2021-02-11 NOTE — ED Provider Notes (Signed)
[facility] EMERGENCY DEPARTMENT Provider Note   CSN: [id number] Arrival date & time: 02/11/21  1844     History Chief Complaint  Patient presents with   Alcohol Intoxication    Sheila Graves is a 40 y.o. female.  HPI  39 year old female with past medical history of alcohol abuse presents the emergency department concern for alcohol intoxication.  Patient was reportedly found walking around a grocery store intoxicated.  She admitted to police and EMS that she drink copious amounts of alcohol.  On arrival patient appears intoxicated, slurred speech, arouses to loud verbal stimuli but otherwise does not participate in history giving and easily falls back asleep.  Patient was seen multiple times in the past couple days for alcohol intoxication and nausea/vomiting.  History limited secondary to alcohol intoxication.  Past Medical History:  Diagnosis Date   Alcohol withdrawal seizure (HCC)    Alcoholic (HCC)    ETOH abuse    Hypokalemia    Pancreatitis    Thyroid disease     Patient Active Problem List   Diagnosis Date Noted   Hypophosphatemia 07/31/2019   Alcohol use disorder, severe, dependence (HCC)    Prolonged QT interval 03/16/2019   Hypokalemia 02/04/2019   Major depressive disorder, recurrent, severe without psychotic features (HCC) 01/31/2018    Past Surgical History:  Procedure Laterality Date   FINGER SURGERY     IRRIGATION AND DEBRIDEMENT SHOULDER Right 11/12/2018   Procedure: Irrigation And Debridement Shoulder;  Surgeon: Roby Lofts, MD;  Location: [facility] OR;  Service: Orthopedics;  Laterality: Right;   ORIF CLAVICULAR FRACTURE Right 11/12/2018   Procedure: OPEN REDUCTION INTERNAL FIXATION (ORIF) CLAVICULAR FRACTURE;  Surgeon: Roby Lofts, MD;  Location: [facility] OR;  Service: Orthopedics;  Laterality: Right;     OB History   No obstetric history on file.     Family History  Problem Relation Age of Onset   Alcohol abuse Other     Social History    Tobacco Use   Smoking status: Former    Packs/day: 1.00    Types: Cigarettes   Smokeless tobacco: Never  Vaping Use   Vaping Use: Never used  Substance Use Topics   Alcohol use: Yes   Drug use: Not Currently    Types: Heroin    Home Medications Prior to Admission medications   Medication Sig Start Date End Date Taking? Authorizing Provider  naproxen (NAPROSYN) 500 MG tablet Take 1 tablet (500 mg total) by mouth 2 (two) times daily. 09/03/20   Linwood Dibbles, MD  potassium chloride (KLOR-CON) 10 MEQ tablet Take 2 tablets (20 mEq total) by mouth daily for 14 days. 02/11/21 02/25/21  Milagros Loll, MD  potassium chloride SA (KLOR-CON) 20 MEQ tablet Take 1 tablet (20 mEq total) by mouth daily. 11/04/20   Zadie Rhine, MD  escitalopram (LEXAPRO) 10 MG tablet Take 1 tablet (10 mg total) by mouth daily. Patient not taking: Reported on 09/16/2019 03/22/19 11/16/19  Eddie North, MD    Allergies    Patient has no known allergies.  Review of Systems   Review of Systems  Unable to perform ROS: Other (Intoxication)   Physical Exam Updated Vital Signs BP (!) 116/92   Pulse (!) 101   Temp 98.4 F (36.9 C) (Oral)   Resp 18   Ht 5\' 11"  (1.803 m)   Wt 63.5 kg   LMP 01/16/2021   SpO2 100%   BMI 19.53 kg/m   Physical Exam Vitals and nursing note reviewed.  Constitutional:      Appearance: Normal appearance. She is not diaphoretic.  HENT:     Head: Normocephalic and atraumatic.     Right Ear: External ear normal.     Left Ear: External ear normal.     Nose: Nose normal.     Mouth/Throat:     Mouth: Mucous membranes are moist.  Eyes:     Pupils: Pupils are equal, round, and reactive to light.  Cardiovascular:     Rate and Rhythm: Tachycardia present.  Pulmonary:     Effort: Pulmonary effort is normal. No respiratory distress.  Abdominal:     Palpations: Abdomen is soft.     Tenderness: There is no abdominal tenderness.  Musculoskeletal:        General: No deformity.      Cervical back: No rigidity.  Skin:    General: Skin is warm.  Neurological:     Mental Status: She is alert.     Comments: Arouses to loud verbal stimuli, intermittently follows commands, moves 4/4 extremities    ED Results / Procedures / Treatments   Labs (all labs ordered are listed, but only abnormal results are displayed) Labs Reviewed  CBC WITH DIFFERENTIAL/PLATELET - Abnormal; Notable for the following components:      Result Value   RBC 5.33 (*)    Hemoglobin 15.4 (*)    All other components within normal limits  BASIC METABOLIC PANEL - Abnormal; Notable for the following components:   Potassium 2.6 (*)    Chloride 87 (*)    CO2 33 (*)    Creatinine, Ser 1.01 (*)    Anion gap 17 (*)    All other components within normal limits  ETHANOL - Abnormal; Notable for the following components:   Alcohol, Ethyl (B) 323 (*)    All other components within normal limits  MAGNESIUM  CBG MONITORING, ED    EKG None  Radiology No results found.  Procedures Procedures   Medications Ordered in ED Medications  potassium chloride 10 mEq in 100 mL IVPB (10 mEq Intravenous New Bag/Given 02/11/21 2106)  sodium chloride 0.9 % bolus 1,000 mL (1,000 mLs Intravenous New Bag/Given 02/11/21 2002)    ED Course  I have reviewed the triage vital signs and the nursing notes.  Pertinent labs & imaging results that were available during my care of the patient were reviewed by me and considered in my medical decision making (see chart for details).    MDM Rules/Calculators/A&P                           40 year old female presents the emergency department after being found intoxicated at a grocery store.  She is drowsy and intoxicated on arrival but arouses to verbal stimuli and follows commands, nonfocal on her neuro exam.  Vitals are stable, she is protecting her airway.  Blood work shows a hypokalemia which has been chronic for the patient for the past couple months.  Magnesium is high  normal.  EKG showed prolonged QTC which the patient again has had chronically in the past.  Patient was hydrated, given IV potassium replacement and allowed to metabolize.  On reevaluation patient is awake, clear speech, appropriate, cooperative.  Repeat EKG shows improved QTC almost to normal.  Patient already has prescribed potassium supplements from her ER visit earlier today where she was also given oral potassium replacement.  Patient is interested in detox programs but not right at this  moment.  She is been able to tolerate p.o.  Patient will be discharged with detox and substance abuse information.  Patient counseled on risks of alcohol abuse.  Patient at this time appears safe and stable for discharge and will be treated as an outpatient.  Discharge plan and strict return to ED precautions discussed, patient verbalizes understanding and agreement.  Final Clinical Impression(s) / ED Diagnoses Final diagnoses:  None    Rx / DC Orders ED Discharge Orders     None        Rozelle Logan, DO 02/11/21 2337

## 2021-02-11 NOTE — ED Provider Notes (Signed)
[facility] EMERGENCY DEPT Provider Note   CSN: [id number] Arrival date & time: 02/11/21  9163     History Chief Complaint  Patient presents with   Nausea    Sheila Graves is a 40 y.o. female.  Past medical history alcohol abuse presents to ER with concern for nausea and vomiting.  Patient reports that while she sitting in the ER lobby when she started to develop nausea and had an episode of vomiting.  Denies abdominal pain.  Still having nausea.  States that she is worried her potassium is low.  Reports prior history of alcohol withdrawal.  Last consumption of alcohol was sometime yesterday.  Patient seen in ER yesterday requesting detox.  Had been discharged.  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     Family History  Problem Relation Age of Onset   Alcohol abuse Other     Social History   Tobacco Use   Smoking status: Former    Packs/day: 1.00    Types: Cigarettes   Smokeless tobacco: Never  Vaping Use   Vaping Use:  Never used  Substance Use Topics   Alcohol use: Yes   Drug use: Not Currently    Types: Heroin    Home Medications Prior to Admission medications   Medication Sig Start Date End Date Taking? Authorizing Provider  naproxen (NAPROSYN) 500 MG tablet Take 1 tablet (500 mg total) by mouth 2 (two) times daily. 09/03/20   Linwood Dibbles, MD  potassium chloride (KLOR-CON) 10 MEQ tablet Take 2 tablets (20 mEq total) by mouth daily for 14 days. 02/11/21 02/25/21  Milagros Loll, MD  potassium chloride SA (KLOR-CON) 20 MEQ tablet Take 1 tablet (20 mEq total) by mouth daily. 11/04/20   Zadie Rhine, MD  escitalopram (LEXAPRO) 10 MG tablet Take 1 tablet (10 mg total) by mouth daily. Patient not taking: Reported on 09/16/2019 03/22/19 11/16/19  Eddie North, MD    Allergies    Patient has no known allergies.  Review of Systems   Review of Systems  Constitutional:  Positive for fatigue. Negative for chills and fever.  HENT:  Negative for ear pain and sore throat.   Eyes:  Negative for pain and visual disturbance.  Respiratory:  Negative for cough and shortness of breath.   Cardiovascular:  Negative for chest pain and palpitations.  Gastrointestinal:  Positive for nausea and vomiting. Negative for abdominal pain.  Genitourinary:  Negative for dysuria and hematuria.  Musculoskeletal:  Negative for  arthralgias and back pain.  Skin:  Negative for color change and rash.  Neurological:  Negative for seizures and syncope.  All other systems reviewed and are negative.  Physical Exam Updated Vital Signs BP 109/76 (BP Location: Right Arm)   Pulse 74   Temp 98.1 F (36.7 C) (Oral)   Resp 15   LMP 01/16/2021   SpO2 99%   Physical Exam Vitals and nursing note reviewed.  Constitutional:      General: She is not in acute distress.    Appearance: She is well-developed.  HENT:     Head: Normocephalic and atraumatic.  Eyes:     Conjunctiva/sclera: Conjunctivae normal.  Cardiovascular:     Rate  and Rhythm: Normal rate and regular rhythm.     Heart sounds: No murmur heard. Pulmonary:     Effort: Pulmonary effort is normal. No respiratory distress.     Breath sounds: Normal breath sounds.  Abdominal:     Palpations: Abdomen is soft.     Tenderness: There is no abdominal tenderness.  Musculoskeletal:        General: No deformity or signs of injury.     Cervical back: Neck supple.  Skin:    General: Skin is warm and dry.  Neurological:     General: No focal deficit present.     Mental Status: She is alert.  Psychiatric:        Mood and Affect: Mood normal.        Thought Content: Thought content normal.     Comments: Does not appear anxious    ED Results / Procedures / Treatments   Labs (all labs ordered are listed, but only abnormal results are displayed) Labs Reviewed  CBC WITH DIFFERENTIAL/PLATELET - Abnormal; Notable for the following components:      Result Value   RBC 5.27 (*)    Hemoglobin 15.3 (*)    All other components within normal limits  COMPREHENSIVE METABOLIC PANEL - Abnormal; Notable for the following components:   Potassium 2.7 (*)    Chloride 88 (*)    CO2 35 (*)    Total Protein 8.4 (*)    Anion gap 16 (*)    All other components within normal limits  URINALYSIS, ROUTINE W REFLEX MICROSCOPIC - Abnormal; Notable for the following components:   pH 8.5 (*)    Protein, ur 30 (*)    All other components within normal limits  MAGNESIUM  PREGNANCY, URINE    EKG None  Radiology No results found.  Procedures Procedures   Medications Ordered in ED Medications  ondansetron (ZOFRAN) injection 4 mg (4 mg Intravenous Given 02/11/21 0759)  sodium chloride 0.9 % bolus 500 mL (0 mLs Intravenous Stopped 02/11/21 0847)  LORazepam (ATIVAN) tablet 1 mg (1 mg Oral Given 02/11/21 0756)  potassium chloride SA (KLOR-CON) CR tablet 40 mEq (40 mEq Oral Given 02/11/21 9562)    ED Course  I have reviewed the triage vital signs and the nursing  notes.  Pertinent labs & imaging results that were available during my care of the patient were reviewed by me and considered in my medical decision making (see chart for details).    MDM Rules/Calculators/A&P                           40 year old lady with history of alcohol abuse presents to ER with complaints of some nausea.  On exam she appears well.  Provided some fluids,  antiemetics.  She reports history of alcohol withdrawal.  Last drink was yesterday per her report.  She does not appear to be having any frank withdrawal symptoms at present.  Vitals are stable.  Basic labs noted for some hypokalemia.  Replenished orally, provided Rx for oral replacement.  Patient inquiring about detox.  Provided resources, consulted case management to facilitate.  Patient discharged.  After the discussed management above, the patient was determined to be safe for discharge.  The patient was in agreement with this plan and all questions regarding their care were answered.  ED return precautions were discussed and the patient will return to the ED with any significant worsening of condition.  Final Clinical Impression(s) / ED Diagnoses Final diagnoses:  Nausea  Alcohol abuse    Rx / DC Orders ED Discharge Orders          Ordered    potassium chloride (KLOR-CON) 10 MEQ tablet  Daily        02/11/21 1056             Milagros Loll, MD 02/12/21 1035

## 2021-02-11 NOTE — ED Notes (Signed)
Pt provided w/ Resource guide and AVS and directed to start calling substance abuse facilities.  Per TOC, we will provide a ride if Pt can find an accepting facility.

## 2021-02-11 NOTE — Discharge Instructions (Addendum)
Follow-up with your primary care doctor.  Follow-up with the resources provided regarding abuse.  Come back to ER for worsening vomiting, or other new concerning symptom.  Your potassium was somewhat low.  Take potassium supplement as prescribed.

## 2021-02-11 NOTE — ED Triage Notes (Signed)
Pt arrives via [facility] long history of ETOH abuse, was picked up from grocery store today where patient was drinking seen in and outside of store for past 3 hours. Grocery store called police who called EMS. EMS reports patient was confused to year. Went to rehab a few months ago and relapsed.   106/76 BP, 90 HR, 16 RR, 95% RA, CBG 102

## 2021-02-11 NOTE — ED Notes (Signed)
Pt unable to find facility w/ availability.  TOC reports no other resources available.  Will notify EDP.

## 2021-02-11 NOTE — ED Notes (Signed)
Patient wanting to be transferred to [facility] due to being homeless and no place to go.  MD informed her he could not do that but he can discharge her.  Patient discharged to lobby

## 2021-02-11 NOTE — Discharge Instructions (Addendum)
You were evaluated in the Emergency Department and after careful evaluation, we did not find any emergent condition requiring admission or further testing in the hospital.  Your exam/testing today was overall reassuring.  Please return to the Emergency Department if you experience any worsening of your condition.  Thank you for allowing us to be a part of your care.  

## 2021-02-11 NOTE — ED Triage Notes (Signed)
Pt was sitting in ED waiting room, waiting for social worker when she became nauseated and vomited.  States all of her fingers are "drawing up" which happens to her when potassium is low.

## 2021-02-11 NOTE — Discharge Planning (Signed)
TOC consulted regarding alcohol withdrawal services.  RNCM placed resources on After Visit Summary (AVS) as pt has to initiate intake and TOC will provide transportation after intake is confirmed.  Plan communicated to ED team.

## 2021-02-11 NOTE — Discharge Instructions (Addendum)
You have been seen and discharged from the emergency department.  Your potassium was slightly low at this visit, you were given oral dose earlier today, you were given IV doses here.  Take your prescribed potassium supplements as directed.  You were also given IV fluids for hydration.  Follow-up as an outpatient for substance abuse counseling/detox programs.  Follow-up with your primary provider for reevaluation and further care. Take home medications as prescribed. If you have any worsening symptoms or further concerns for your health please return to an emergency department for further evaluation.

## 2021-02-20 ENCOUNTER — Encounter (HOSPITAL_COMMUNITY): Payer: Self-pay | Admitting: Emergency Medicine

## 2021-02-20 ENCOUNTER — Emergency Department (HOSPITAL_COMMUNITY)
Admission: EM | Admit: 2021-02-20 | Discharge: 2021-02-21 | Disposition: A | Discharge status: Home / Self Care | Payer: Self-pay | Attending: Emergency Medicine | Admitting: Emergency Medicine

## 2021-02-20 ENCOUNTER — Other Ambulatory Visit: Payer: Self-pay

## 2021-02-20 ENCOUNTER — Emergency Department (HOSPITAL_COMMUNITY): Payer: Self-pay

## 2021-02-20 DIAGNOSIS — G9340 Encephalopathy, unspecified: Secondary | ICD-10-CM | POA: Insufficient documentation

## 2021-02-20 DIAGNOSIS — X58XXXA Exposure to other specified factors, initial encounter: Secondary | ICD-10-CM | POA: Insufficient documentation

## 2021-02-20 DIAGNOSIS — Y908 Blood alcohol level of 240 mg/100 ml or more: Secondary | ICD-10-CM | POA: Insufficient documentation

## 2021-02-20 DIAGNOSIS — T50904A Poisoning by unspecified drugs, medicaments and biological substances, undetermined, initial encounter: Secondary | ICD-10-CM | POA: Insufficient documentation

## 2021-02-20 DIAGNOSIS — F10129 Alcohol abuse with intoxication, unspecified: Secondary | ICD-10-CM | POA: Insufficient documentation

## 2021-02-20 DIAGNOSIS — F10929 Alcohol use, unspecified with intoxication, unspecified: Secondary | ICD-10-CM

## 2021-02-20 DIAGNOSIS — Z87891 Personal history of nicotine dependence: Secondary | ICD-10-CM | POA: Insufficient documentation

## 2021-02-20 DIAGNOSIS — O0289 Other abnormal products of conception: Secondary | ICD-10-CM | POA: Insufficient documentation

## 2021-02-20 LAB — ACETAMINOPHEN LEVEL: Acetaminophen (Tylenol), Serum: <10 ug/mL — ABNORMAL LOW (ref 10–30)

## 2021-02-20 LAB — COMPREHENSIVE METABOLIC PANEL
ALT: 78 U/L — ABNORMAL HIGH (ref 0–44)
AST: 103 U/L — ABNORMAL HIGH (ref 15–41)
Albumin: 3.8 g/dL (ref 3.5–5.0)
Alkaline Phosphatase: 77 U/L (ref 38–126)
Anion gap: 11 (ref 5–15)
BUN: 12 mg/dL (ref 6–20)
CO2: 24 mmol/L (ref 22–32)
Calcium: 8.4 mg/dL — ABNORMAL LOW (ref 8.9–10.3)
Chloride: 105 mmol/L (ref 98–111)
Creatinine, Ser: 0.77 mg/dL (ref 0.44–1.00)
GFR, Estimated: >60 mL/min (ref >60)
Glucose, Bld: 100 mg/dL — ABNORMAL HIGH (ref 70–99)
Potassium: 3.6 mmol/L (ref 3.5–5.1)
Sodium: 140 mmol/L (ref 135–145)
Total Bilirubin: 0.5 mg/dL (ref 0.3–1.2)
Total Protein: 6.7 g/dL (ref 6.5–8.1)

## 2021-02-20 LAB — I-STAT ARTERIAL BLOOD GAS, ED
Acid-Base Excess: 2 mmol/L (ref 0.0–2.0)
Bicarbonate: 27.2 mmol/L (ref 20.0–28.0)
Calcium, Ion: 1.03 mmol/L — ABNORMAL LOW (ref 1.15–1.40)
HCT: 35 % — ABNORMAL LOW (ref 36.0–46.0)
Hemoglobin: 11.9 g/dL — ABNORMAL LOW (ref 12.0–15.0)
O2 Saturation: 98 %
Patient temperature: 98.2
Potassium: 3.5 mmol/L (ref 3.5–5.1)
Sodium: 148 mmol/L — ABNORMAL HIGH (ref 135–145)
TCO2: 28 mmol/L (ref 22–32)
pCO2 arterial: 42.1 mmHg (ref 32.0–48.0)
pH, Arterial: 7.417 (ref 7.350–7.450)
pO2, Arterial: 101 mmHg (ref 83.0–108.0)

## 2021-02-20 LAB — CBC WITH DIFFERENTIAL/PLATELET
Abs Immature Granulocytes: 0.01 10*3/uL (ref 0.00–0.07)
Basophils Absolute: 0 10*3/uL (ref 0.0–0.1)
Basophils Relative: 0 %
Eosinophils Absolute: 0 10*3/uL (ref 0.0–0.5)
Eosinophils Relative: 0 %
HCT: 39.9 % (ref 36.0–46.0)
Hemoglobin: 12.8 g/dL (ref 12.0–15.0)
Immature Granulocytes: 0 %
Lymphocytes Relative: 49 %
Lymphs Abs: 2.5 10*3/uL (ref 0.7–4.0)
MCH: 28.8 pg (ref 26.0–34.0)
MCHC: 32.1 g/dL (ref 30.0–36.0)
MCV: 89.9 fL (ref 80.0–100.0)
Monocytes Absolute: 0.4 10*3/uL (ref 0.1–1.0)
Monocytes Relative: 9 %
Neutro Abs: 2.1 10*3/uL (ref 1.7–7.7)
Neutrophils Relative %: 42 %
Platelets: 172 10*3/uL (ref 150–400)
RBC: 4.44 MIL/uL (ref 3.87–5.11)
RDW: 14.3 % (ref 11.5–15.5)
WBC: 5.1 10*3/uL (ref 4.0–10.5)
nRBC: 0 % (ref 0.0–0.2)

## 2021-02-20 LAB — ETHANOL: Alcohol, Ethyl (B): 465 mg/dL (ref <10)

## 2021-02-20 LAB — SALICYLATE LEVEL: Salicylate Lvl: <7 mg/dL — ABNORMAL LOW (ref 7.0–30.0)

## 2021-02-20 MED ORDER — SODIUM CHLORIDE 0.9 % IV BOLUS
1000.0000 mL | Freq: Once | INTRAVENOUS | Status: AC
  Administered 2021-02-20: 1000 mL via INTRAVENOUS

## 2021-02-20 NOTE — Discharge Instructions (Addendum)
You were seen in the emergency department and treated for severe alcohol intoxication.  While you are here, because of your confusion and unclear history, you had a broad medical workup.  This included a CT scan of your brain, blood test, and urine samples.  Fortunately we did not find any other medical emergencies on your work-up.  Your blood alcohol level was extremely high.  You were watched in the emergency department for nearly 17 hours, until you were sober enough to speak clearly, eat, and walk in the ER.  I would strongly encourage you to look for alcohol counseling or rehab.  A list of rehab centers are provided.  This level of drinking and alcohol intoxication is extremely dangerous, and can lead to seizures or death.  Call your primary care doctor or specialist as discussed in the next 2-3 days.    Return immediately back to the ER if:  Your symptoms worsen within the next 12-24 hours. You develop new symptoms such as new fevers, persistent vomiting, new pain, shortness of breath, or new weakness or numbness, or if you have any other concerns.

## 2021-02-20 NOTE — ED Provider Notes (Signed)
[facility] EMERGENCY DEPARTMENT Provider Note   CSN: [id number] Arrival date & time: 02/20/21  2148     History Chief Complaint  Patient presents with   Drug Overdose    Sheila Graves is a 40 y.o. female.  Patient brought in by EMS chief complaint of altered mental status.  EMS reports that bystanders at a gas station noted the patient coming out of the gas station and and asking for help before falling down and becoming unresponsive.  EMS arrived and noted pupils were pinpoint and gave her Narcan 1 mg twice with return of baseline mental status, however she became somnolent again.  No additional history or review of systems unable to be obtained.  Reviewing her record shows multiple recent visits for alcohol intoxication.      Past Medical History:  Diagnosis Date   Alcohol withdrawal seizure (HCC)    Alcoholic (HCC)    ETOH abuse    Hypokalemia    Pancreatitis    Thyroid disease     Patient Active Problem List   Diagnosis Date Noted   Hypophosphatemia 07/31/2019   Alcohol use disorder, severe, dependence (HCC)    Prolonged QT interval 03/16/2019   Hypokalemia 02/04/2019   Major depressive disorder, recurrent, severe without psychotic features (HCC) 01/31/2018    Past Surgical History:  Procedure Laterality Date   FINGER SURGERY     IRRIGATION AND DEBRIDEMENT SHOULDER Right 11/12/2018   Procedure: Irrigation And Debridement Shoulder;  Surgeon: Roby Lofts, MD;  [facility]: [facility] OR;  Service: Orthopedics;  Laterality: Right;   ORIF CLAVICULAR FRACTURE Right 11/12/2018   Procedure: OPEN REDUCTION INTERNAL FIXATION (ORIF) CLAVICULAR FRACTURE;  Surgeon: Roby Lofts, MD;  Location: [facility] OR;  Service: Orthopedics;  Laterality: Right;     OB History   No obstetric history on file.     Family History  Problem Relation Age of Onset   Alcohol abuse Other     Social History   Tobacco Use   Smoking status: Former    Packs/day: 1.00    Types:  Cigarettes   Smokeless tobacco: Never  Vaping Use   Vaping Use: Never used  Substance Use Topics   Alcohol use: Yes   Drug use: Not Currently    Types: Heroin    Home Medications Prior to Admission medications   Medication Sig Start Date End Date Taking? Authorizing Provider  naproxen (NAPROSYN) 500 MG tablet Take 1 tablet (500 mg total) by mouth 2 (two) times daily. 09/03/20   Linwood Dibbles, MD  potassium chloride (KLOR-CON) 10 MEQ tablet Take 2 tablets (20 mEq total) by mouth daily for 14 days. 02/11/21 02/25/21  Milagros Loll, MD  potassium chloride SA (KLOR-CON) 20 MEQ tablet Take 1 tablet (20 mEq total) by mouth daily. 11/04/20   Zadie Rhine, MD  escitalopram (LEXAPRO) 10 MG tablet Take 1 tablet (10 mg total) by mouth daily. Patient not taking: Reported on 09/16/2019 03/22/19 11/16/19  Eddie North, MD    Allergies    Patient has no known allergies.  Review of Systems   Review of Systems  Unable to perform ROS: Mental status change   Physical Exam Updated Vital Signs BP 107/75   Pulse 100   Temp 98.2 F (36.8 C) (Oral)   Resp 17   SpO2 98%   Physical Exam Constitutional:      Appearance: Normal appearance.     Comments: Patient appears somnolent but arousable with sternal rub.  HENT:  Head: Normocephalic.     Nose: Nose normal.  Eyes:     Extraocular Movements: Extraocular movements intact.  Cardiovascular:     Rate and Rhythm: Normal rate.  Pulmonary:     Effort: Pulmonary effort is normal.  Musculoskeletal:        General: Normal range of motion.     Cervical back: Normal range of motion.  Neurological:     Comments: Painful stimuli she will wake up and say her name and answer questions with short 1 or 2 word answers before falling asleep again.  Appears to be maintaining her airway and managing her oral secretions.    ED Results / Procedures / Treatments   Labs (all labs ordered are listed, but only abnormal results are displayed) Labs Reviewed   COMPREHENSIVE METABOLIC PANEL - Abnormal; Notable for the following components:      Result Value   Glucose, Bld 100 (*)    Calcium 8.4 (*)    AST 103 (*)    ALT 78 (*)    All other components within normal limits  ETHANOL - Abnormal; Notable for the following components:   Alcohol, Ethyl (B) 465 (*)    All other components within normal limits  SALICYLATE LEVEL - Abnormal; Notable for the following components:   Salicylate Lvl <7.0 (*)    All other components within normal limits  ACETAMINOPHEN LEVEL - Abnormal; Notable for the following components:   Acetaminophen (Tylenol), Serum <10 (*)    All other components within normal limits  CBC WITH DIFFERENTIAL/PLATELET  BLOOD GAS, ARTERIAL  RAPID URINE DRUG SCREEN, HOSP PERFORMED  URINALYSIS, ROUTINE W REFLEX MICROSCOPIC  PREGNANCY, URINE    EKG EKG Interpretation  Date/Time:  Thursday February 20 2021 21:56:24 EDT Ventricular Rate:  99 PR Interval:  126 QRS Duration: 88 QT Interval:  347 QTC Calculation: 446 R Axis:   84 Text Interpretation: Sinus rhythm Confirmed by Norman Clay ([id number]) on 02/20/2021 10:19:41 PM  Radiology CT Head Wo Contrast  Result Date: 02/20/2021 CLINICAL DATA:  Mental status change, unknown cause EXAM: CT HEAD WITHOUT CONTRAST TECHNIQUE: Contiguous axial images were obtained from the base of the skull through the vertex without intravenous contrast. COMPARISON:  Most recent head CT 07/21/2019 FINDINGS: Brain: Patient had difficulty tolerating the exam, unusual scan plane with mild motion artifact. Allowing for limitations, no evidence of hemorrhage or acute ischemia. No hydrocephalus. No midline shift or mass effect. No subdural or extra-axial collection. Vascular: No hyperdense vessel. Skull: No fracture or focal lesion. Sinuses/Orbits: No acute findings. Other: None. IMPRESSION: No acute intracranial abnormality. Electronically Signed   By: Narda Rutherford M.D.   On: 02/20/2021 22:45   CT Cervical Spine  Wo Contrast  Result Date: 02/20/2021 CLINICAL DATA:  Neck trauma, intoxicated or obtunded (Age >= 16y) EXAM: CT CERVICAL SPINE WITHOUT CONTRAST TECHNIQUE: Multidetector CT imaging of the cervical spine was performed without intravenous contrast. Multiplanar CT image reconstructions were also generated. COMPARISON:  Most recent CT 07/17/2019 FINDINGS: Alignment: Non traditional scan plane due to patient's difficulty tolerating the exam. Rotation of C1 on C2 is presumably positional. No listhesis. Skull base and vertebrae: No acute fracture. Vertebral body heights are maintained. The dens and skull base are intact. Soft tissues and spinal canal: No prevertebral fluid or swelling. No visible canal hematoma. Disc levels: Posterior disc osteophyte complex at C3-C4. No acute disc findings on CT. Upper chest: No acute or unexpected findings. Previous right clavicle fixation. Other: None. IMPRESSION: 1. Rotation of  C1 on C2 is likely due to positioning and non traditional scan plane. 2. No acute fracture of the cervical spine. Electronically Signed   By: Narda Rutherford M.D.   On: 02/20/2021 22:49   DG Chest Port 1 View  Result Date: 02/20/2021 CLINICAL DATA:  Shortness of breath EXAM: PORTABLE CHEST 1 VIEW COMPARISON:  10/04/2020 FINDINGS: The heart size and mediastinal contours are within normal limits. Both lungs are clear. The visualized skeletal structures are unremarkable. IMPRESSION: No active disease. Electronically Signed   By: Charlett Nose M.D.   On: 02/20/2021 22:11    Procedures .Critical Care  Date/Time: 02/20/2021 11:04 PM Performed by: Cheryll Cockayne, MD Authorized by: Cheryll Cockayne, MD   Critical care provider statement:    Critical care time (minutes):  30   Critical care time was exclusive of:  Separately billable procedures and treating other patients   Critical care was necessary to treat or prevent imminent or life-threatening deterioration of the following conditions:  Toxidrome    Medications Ordered in ED Medications  sodium chloride 0.9 % bolus 1,000 mL (1,000 mLs Intravenous New Bag/Given 02/20/21 2203)    ED Course  I have reviewed the triage vital signs and the nursing notes.  Pertinent labs & imaging results that were available during my care of the patient were reviewed by me and considered in my medical decision making (see chart for details).    MDM Rules/Calculators/A&P                           I was called immediately to the patient's bedside given concern for acute drug overdose and concern for airway management.  Given history and report, suspect polysubstance abuse.  Labs are sent alcohol elevated greater than 400.  Chemistry otherwise at baseline.  Tylenol and aspirin levels are normal.  CT imaging is unremarkable as well.  Patient given a liter bolus of fluids.  Anticipate continued improvement of mental status as the patient sobers up.  Vital signs remained stable at this time on room air.   Final Clinical Impression(s) / ED Diagnoses Final diagnoses:  Acute encephalopathy  Drug overdose, undetermined intent, initial encounter  Alcoholic intoxication with complication [facility])    Rx / DC Orders ED Discharge Orders     None        Cheryll Cockayne, MD 02/20/21 2304

## 2021-02-20 NOTE — ED Triage Notes (Signed)
Pt BIB [facility], bystanders witnessed pt collapse outside a gas station, on EMS arrival pt with pinpoint pupils and agonal respirations. Given 2mg  narcan pta with positive response. Pt responsive to pain.

## 2021-02-20 NOTE — ED Provider Notes (Signed)
11:26 PM Assumed care from Dr. Audley Hose, please see their note for full history, physical and decision making until this point. In brief this is a 40 y.o. year old female who presented to the ED tonight with Drug Overdose     Here for syncope/overdose. Significant alcohol intoxication. Pending MTF for disposition.   Attempted to ambulate at 0530. Did not work. Will observe longer.   Labs, studies and imaging reviewed by myself and considered in medical decision making if ordered. Imaging interpreted by radiology.  Labs Reviewed  COMPREHENSIVE METABOLIC PANEL - Abnormal; Notable for the following components:      Result Value   Glucose, Bld 100 (*)    Calcium 8.4 (*)    AST 103 (*)    ALT 78 (*)    All other components within normal limits  ETHANOL - Abnormal; Notable for the following components:   Alcohol, Ethyl (B) 465 (*)    All other components within normal limits  SALICYLATE LEVEL - Abnormal; Notable for the following components:   Salicylate Lvl <7.0 (*)    All other components within normal limits  ACETAMINOPHEN LEVEL - Abnormal; Notable for the following components:   Acetaminophen (Tylenol), Serum <10 (*)    All other components within normal limits  I-STAT ARTERIAL BLOOD GAS, ED - Abnormal; Notable for the following components:   Sodium 148 (*)    Calcium, Ion 1.03 (*)    HCT 35.0 (*)    Hemoglobin 11.9 (*)    All other components within normal limits  CBC WITH DIFFERENTIAL/PLATELET  BLOOD GAS, ARTERIAL  RAPID URINE DRUG SCREEN, HOSP PERFORMED    CT Head Wo Contrast  Final Result    CT Cervical Spine Wo Contrast  Final Result    DG Chest Port 1 View  Final Result      No follow-ups on file.    Danial Hlavac, Barbara Cower, MD 02/21/21 [phone]

## 2021-02-21 ENCOUNTER — Observation Stay (HOSPITAL_COMMUNITY)
Admission: EM | Admit: 2021-02-21 | Discharge: 2021-02-24 | Disposition: A | Discharge status: Home / Self Care | Payer: Self-pay | Attending: Family Medicine | Admitting: Family Medicine

## 2021-02-21 DIAGNOSIS — G9340 Encephalopathy, unspecified: Principal | ICD-10-CM | POA: Insufficient documentation

## 2021-02-21 DIAGNOSIS — F10129 Alcohol abuse with intoxication, unspecified: Secondary | ICD-10-CM | POA: Insufficient documentation

## 2021-02-21 DIAGNOSIS — Y908 Blood alcohol level of 240 mg/100 ml or more: Secondary | ICD-10-CM | POA: Insufficient documentation

## 2021-02-21 DIAGNOSIS — Z20822 Contact with and (suspected) exposure to covid-19: Secondary | ICD-10-CM | POA: Insufficient documentation

## 2021-02-21 DIAGNOSIS — F10929 Alcohol use, unspecified with intoxication, unspecified: Secondary | ICD-10-CM | POA: Diagnosis present

## 2021-02-21 DIAGNOSIS — F329 Major depressive disorder, single episode, unspecified: Secondary | ICD-10-CM | POA: Insufficient documentation

## 2021-02-21 DIAGNOSIS — Z87891 Personal history of nicotine dependence: Secondary | ICD-10-CM | POA: Insufficient documentation

## 2021-02-21 DIAGNOSIS — F332 Major depressive disorder, recurrent severe without psychotic features: Secondary | ICD-10-CM | POA: Diagnosis present

## 2021-02-21 DIAGNOSIS — Z79899 Other long term (current) drug therapy: Secondary | ICD-10-CM | POA: Insufficient documentation

## 2021-02-21 LAB — PREGNANCY, URINE: Preg Test, Ur: NEGATIVE

## 2021-02-21 LAB — CBC WITH DIFFERENTIAL/PLATELET
Abs Immature Granulocytes: 0.01 10*3/uL (ref 0.00–0.07)
Basophils Absolute: 0 10*3/uL (ref 0.0–0.1)
Basophils Relative: 0 %
Eosinophils Absolute: 0 10*3/uL (ref 0.0–0.5)
Eosinophils Relative: 0 %
HCT: 37.4 % (ref 36.0–46.0)
Hemoglobin: 12.4 g/dL (ref 12.0–15.0)
Immature Granulocytes: 0 %
Lymphocytes Relative: 45 %
Lymphs Abs: 2 10*3/uL (ref 0.7–4.0)
MCH: 29.6 pg (ref 26.0–34.0)
MCHC: 33.2 g/dL (ref 30.0–36.0)
MCV: 89.3 fL (ref 80.0–100.0)
Monocytes Absolute: 0.4 10*3/uL (ref 0.1–1.0)
Monocytes Relative: 8 %
Neutro Abs: 2 10*3/uL (ref 1.7–7.7)
Neutrophils Relative %: 47 %
Platelets: 169 10*3/uL (ref 150–400)
RBC: 4.19 MIL/uL (ref 3.87–5.11)
RDW: 14.4 % (ref 11.5–15.5)
WBC: 4.3 10*3/uL (ref 4.0–10.5)
nRBC: 0 % (ref 0.0–0.2)

## 2021-02-21 LAB — ETHANOL: Alcohol, Ethyl (B): 504 mg/dL (ref <10)

## 2021-02-21 LAB — BASIC METABOLIC PANEL
Anion gap: 13 (ref 5–15)
BUN: 15 mg/dL (ref 6–20)
CO2: 20 mmol/L — ABNORMAL LOW (ref 22–32)
Calcium: 8.2 mg/dL — ABNORMAL LOW (ref 8.9–10.3)
Chloride: 105 mmol/L (ref 98–111)
Creatinine, Ser: 0.82 mg/dL (ref 0.44–1.00)
GFR, Estimated: >60 mL/min (ref >60)
Glucose, Bld: 82 mg/dL (ref 70–99)
Potassium: 4 mmol/L (ref 3.5–5.1)
Sodium: 138 mmol/L (ref 135–145)

## 2021-02-21 LAB — RAPID URINE DRUG SCREEN, HOSP PERFORMED
Amphetamines: NOT DETECTED
Barbiturates: NOT DETECTED
Benzodiazepines: POSITIVE — AB
Cocaine: NOT DETECTED
Opiates: NOT DETECTED
Tetrahydrocannabinol: NOT DETECTED

## 2021-02-21 LAB — I-STAT BETA HCG BLOOD, ED (MC, WL, AP ONLY): I-stat hCG, quantitative: 14.2 m[IU]/mL — ABNORMAL HIGH (ref <5)

## 2021-02-21 LAB — CK: Total CK: 183 U/L (ref 38–234)

## 2021-02-21 MED ORDER — LACTATED RINGERS IV BOLUS
1000.0000 mL | Freq: Once | INTRAVENOUS | Status: AC
  Administered 2021-02-21: 1000 mL via INTRAVENOUS

## 2021-02-21 NOTE — ED Provider Notes (Signed)
[facility] EMERGENCY DEPARTMENT Provider Note   CSN: [id number] Arrival date & time: 02/21/21  2029     History Chief Complaint  Patient presents with   Altered Mental Status    Sheila Graves is a 40 y.o. female.   Altered Mental Status  40 year old female with a past medical history of alcohol abuse presenting to the emergency department via EMS after being found down.  EMS reports that they were called out to a gas station and they found the patient unresponsive in the alley way.  They state that she is protecting her own airway and smelled strongly of alcohol.  Currently in the emergency department, patient unable to participate in history gathering.  EMS report that her blood sugar was 111.  Past Medical History:  Diagnosis Date   Alcohol withdrawal seizure (HCC)    Alcoholic (HCC)    ETOH abuse    Hypokalemia    Pancreatitis    Thyroid disease     Patient Active Problem List   Diagnosis Date Noted   Hypophosphatemia 07/31/2019   Alcohol use disorder, severe, dependence (HCC)    Prolonged QT interval 03/16/2019   Hypokalemia 02/04/2019   Major depressive disorder, recurrent, severe without psychotic features (HCC) 01/31/2018    Past Surgical History:  Procedure Laterality Date   FINGER SURGERY     IRRIGATION AND DEBRIDEMENT SHOULDER Right 11/12/2018   Procedure: Irrigation And Debridement Shoulder;  Surgeon: Roby Lofts, MD;  Location: [facility] OR;  Service: Orthopedics;  Laterality: Right;   ORIF CLAVICULAR FRACTURE Right 11/12/2018   Procedure: OPEN REDUCTION INTERNAL FIXATION (ORIF) CLAVICULAR FRACTURE;  Surgeon: Roby Lofts, MD;  Location: [facility] OR;  Service: Orthopedics;  Laterality: Right;     OB History   No obstetric history on file.     Family History  Problem Relation Age of Onset   Alcohol abuse Other     Social History   Tobacco Use   Smoking status: Former    Packs/day: 1.00    Types: Cigarettes   Smokeless tobacco: Never   Vaping Use   Vaping Use: Never used  Substance Use Topics   Alcohol use: Yes   Drug use: Not Currently    Types: Heroin    Home Medications Prior to Admission medications   Medication Sig Start Date End Date Taking? Authorizing Provider  naproxen (NAPROSYN) 500 MG tablet Take 1 tablet (500 mg total) by mouth 2 (two) times daily. Patient not taking: No sig reported 09/03/20   Linwood Dibbles, MD  potassium chloride (KLOR-CON) 10 MEQ tablet Take 2 tablets (20 mEq total) by mouth daily for 14 days. Patient not taking: No sig reported 02/11/21 02/25/21  Milagros Loll, MD  potassium chloride SA (KLOR-CON) 20 MEQ tablet Take 1 tablet (20 mEq total) by mouth daily. Patient not taking: No sig reported 11/04/20   Zadie Rhine, MD  escitalopram (LEXAPRO) 10 MG tablet Take 1 tablet (10 mg total) by mouth daily. Patient not taking: Reported on 09/16/2019 03/22/19 11/16/19  Eddie North, MD    Allergies    Patient has no known allergies.  Review of Systems   Review of Systems  Unable to perform ROS: Mental status change   Physical Exam Updated Vital Signs There were no vitals taken for this visit.  Physical Exam Constitutional:      General: She is not in acute distress.    Appearance: She is not toxic-appearing.     Comments: Asleep, disheveled appearing  HENT:     Head: Normocephalic and atraumatic.     Mouth/Throat:     Mouth: Mucous membranes are moist.     Pharynx: Oropharynx is clear. No oropharyngeal exudate or posterior oropharyngeal erythema.  Eyes:     General: No scleral icterus.    Pupils: Pupils are equal, round, and reactive to light.  Cardiovascular:     Rate and Rhythm: Normal rate and regular rhythm.  Pulmonary:     Effort: No respiratory distress.     Breath sounds: No stridor. No wheezing, rhonchi or rales.     Comments: She is protecting her airway, bilateral breath sounds are clear Abdominal:     General: Abdomen is flat. There is no distension.      Tenderness: There is no abdominal tenderness. There is no guarding or rebound.  Musculoskeletal:        General: No deformity.     Cervical back: Normal range of motion and neck supple. No rigidity.  Skin:    General: Skin is warm and dry.     Capillary Refill: Capillary refill takes less than 2 seconds.  Neurological:     Mental Status: She is disoriented.     Comments: Patient does not arouse to verbal stimulus.  She responds to noxious stimulus including sternal rub.  She will localize and withdraw in all 4 extremities with equal and brisk strength.  When I sternal rub the patient, she wakes up and says "Ow stop that".   ED Results / Procedures / Treatments   Labs (all labs ordered are listed, but only abnormal results are displayed) Labs Reviewed  BASIC METABOLIC PANEL - Abnormal; Notable for the following components:      Result Value   CO2 20 (*)    Calcium 8.2 (*)    All other components within normal limits  ETHANOL - Abnormal; Notable for the following components:   Alcohol, Ethyl (B) 504 (*)    All other components within normal limits  CBC WITH DIFFERENTIAL/PLATELET  CK    EKG EKG Interpretation  Date/Time:  Friday February 21 2021 20:34:23 EDT Ventricular Rate:  89 PR Interval:  129 QRS Duration: 93 QT Interval:  374 QTC Calculation: 456 R Axis:   78 Text Interpretation: Sinus rhythm No significant change since last tracing Confirmed by Jacalyn Lefevre [phone]) on 02/21/2021 8:55:49 PM  Radiology CT Head Wo Contrast  Result Date: 02/20/2021 CLINICAL DATA:  Mental status change, unknown cause EXAM: CT HEAD WITHOUT CONTRAST TECHNIQUE: Contiguous axial images were obtained from the base of the skull through the vertex without intravenous contrast. COMPARISON:  Most recent head CT 07/21/2019 FINDINGS: Brain: Patient had difficulty tolerating the exam, unusual scan plane with mild motion artifact. Allowing for limitations, no evidence of hemorrhage or acute ischemia. No  hydrocephalus. No midline shift or mass effect. No subdural or extra-axial collection. Vascular: No hyperdense vessel. Skull: No fracture or focal lesion. Sinuses/Orbits: No acute findings. Other: None. IMPRESSION: No acute intracranial abnormality. Electronically Signed   By: Narda Rutherford M.D.   On: 02/20/2021 22:45   CT Cervical Spine Wo Contrast  Result Date: 02/20/2021 CLINICAL DATA:  Neck trauma, intoxicated or obtunded (Age >= 16y) EXAM: CT CERVICAL SPINE WITHOUT CONTRAST TECHNIQUE: Multidetector CT imaging of the cervical spine was performed without intravenous contrast. Multiplanar CT image reconstructions were also generated. COMPARISON:  Most recent CT 07/17/2019 FINDINGS: Alignment: Non traditional scan plane due to patient's difficulty tolerating the exam. Rotation of C1 on C2 is  presumably positional. No listhesis. Skull base and vertebrae: No acute fracture. Vertebral body heights are maintained. The dens and skull base are intact. Soft tissues and spinal canal: No prevertebral fluid or swelling. No visible canal hematoma. Disc levels: Posterior disc osteophyte complex at C3-C4. No acute disc findings on CT. Upper chest: No acute or unexpected findings. Previous right clavicle fixation. Other: None. IMPRESSION: 1. Rotation of C1 on C2 is likely due to positioning and non traditional scan plane. 2. No acute fracture of the cervical spine. Electronically Signed   By: Narda Rutherford M.D.   On: 02/20/2021 22:49   DG Chest Port 1 View  Result Date: 02/20/2021 CLINICAL DATA:  Shortness of breath EXAM: PORTABLE CHEST 1 VIEW COMPARISON:  10/04/2020 FINDINGS: The heart size and mediastinal contours are within normal limits. Both lungs are clear. The visualized skeletal structures are unremarkable. IMPRESSION: No active disease. Electronically Signed   By: Charlett Nose M.D.   On: 02/20/2021 22:11    Procedures Procedures   Medications Ordered in ED Medications  lactated ringers bolus 1,000  mL (has no administration in time range)    ED Course  I have reviewed the triage vital signs and the nursing notes.  Pertinent labs & imaging results that were available during my care of the patient were reviewed by me and considered in my medical decision making (see chart for details).     MDM Rules/Calculators/A&P                           40 year old female with alcohol abuse presenting to the emergency department via EMS after being found down behind a gas station.  She was recently discharged from the emergency department earlier the same day for alcohol intoxication.  Vital signs reviewed, within acceptable limits.  Physical exam is notable for an obtunded female response to noxious stimuli.  She has no focal neurologic deficits on my exam but requires significant noxious stimulus to become alert.  However she is protecting her airway.  We will obtain a CBC, BMP to evaluate for metabolic acidosis that could suggest she has a toxic alcohol ingestion.  Overall, presentation seem most consistent for alcohol intoxication.  Will obtain ethanol level.  Will provide fluids.  Blood sugar within acceptable limits.  No metabolic acidosis or anion gap.  Ethanol level returned markedly elevated at 504.  On my reevaluation, the patient remains obtunded but is protecting her airway.  She will require additional time in order to metabolize her alcohol on board.  Care of the patient signed out to oncoming provider.  Final Clinical Impression(s) / ED Diagnoses Final diagnoses:  Blood alcohol level of 240 mg/100 ml or more    Rx / DC Orders ED Discharge Orders     None        Lenard Lance, MD 02/21/21 2258    Jacalyn Lefevre, MD 02/21/21 2321

## 2021-02-21 NOTE — ED Notes (Signed)
Mother called Dois Davenport [phone] states she is trying to get her into a rehab. Today in [place].

## 2021-02-21 NOTE — ED Notes (Signed)
Attempted to ambulate patient. Pt without steady gait and stumbling in room. Escorted pt back to bed and notified MD.

## 2021-02-21 NOTE — ED Provider Notes (Signed)
I assumed care of this patient from Dr Erin Hearing.  Briefly this is a 40 year old female presenting by EMS with acute etoh intoxication, possible syncope (?) in the field, given narcan by EMS.  Here she had largely unremarkable CTH.  ETOH level 465.  UDS + benzos.  ABG normal.  CMP and CBC unremarkable.  Vitals normal overnight.  Patient intoxicated but awakens to stimulation.  On my assessment the patient remains somnolent and sleeping, but does arouse to stimulation.  She has slurred speech.  She tells me "If you keep shaking me you're gonna get a big hurting."  Pending IV fluids, reassessment after clinical sobriety, possible discharge if more sober.  2 pm-patient is now awake, alert, ate entire breakfast tray, ambulated steadily to the bathroom, had called and spoken to her mother.  Stable for discharge with family.   Terald Sleeper, MD 02/21/21 [phone]

## 2021-02-21 NOTE — ED Triage Notes (Signed)
Patient found down, no obvious signs of fall or injury. Hx of ETOH abuse. Patient responsive to pain.

## 2021-02-21 NOTE — ED Notes (Signed)
Much more alert talking with mother on the phone.

## 2021-02-22 ENCOUNTER — Emergency Department (HOSPITAL_COMMUNITY): Payer: Self-pay

## 2021-02-22 DIAGNOSIS — E872 Acidosis: Secondary | ICD-10-CM

## 2021-02-22 DIAGNOSIS — G9340 Encephalopathy, unspecified: Secondary | ICD-10-CM | POA: Diagnosis present

## 2021-02-22 DIAGNOSIS — F10929 Alcohol use, unspecified with intoxication, unspecified: Secondary | ICD-10-CM | POA: Diagnosis present

## 2021-02-22 DIAGNOSIS — F10920 Alcohol use, unspecified with intoxication, uncomplicated: Secondary | ICD-10-CM

## 2021-02-22 LAB — I-STAT ARTERIAL BLOOD GAS, ED
Acid-base deficit: 2 mmol/L (ref 0.0–2.0)
Bicarbonate: 23.2 mmol/L (ref 20.0–28.0)
Calcium, Ion: 1.06 mmol/L — ABNORMAL LOW (ref 1.15–1.40)
HCT: 35 % — ABNORMAL LOW (ref 36.0–46.0)
Hemoglobin: 11.9 g/dL — ABNORMAL LOW (ref 12.0–15.0)
O2 Saturation: 97 %
Patient temperature: 98.6
Potassium: 3.7 mmol/L (ref 3.5–5.1)
Sodium: 144 mmol/L (ref 135–145)
TCO2: 24 mmol/L (ref 22–32)
pCO2 arterial: 42.4 mmHg (ref 32.0–48.0)
pH, Arterial: 7.347 — ABNORMAL LOW (ref 7.350–7.450)
pO2, Arterial: 102 mmHg (ref 83.0–108.0)

## 2021-02-22 LAB — RESP PANEL BY RT-PCR (FLU A&B, COVID) ARPGX2
Influenza A by PCR: NEGATIVE
Influenza B by PCR: NEGATIVE
SARS Coronavirus 2 by RT PCR: NEGATIVE

## 2021-02-22 LAB — RAPID URINE DRUG SCREEN, HOSP PERFORMED
Amphetamines: NOT DETECTED
Barbiturates: NOT DETECTED
Benzodiazepines: POSITIVE — AB
Cocaine: NOT DETECTED
Opiates: NOT DETECTED
Tetrahydrocannabinol: NOT DETECTED

## 2021-02-22 LAB — MAGNESIUM: Magnesium: 1.9 mg/dL (ref 1.7–2.4)

## 2021-02-22 LAB — PHOSPHORUS: Phosphorus: 3.1 mg/dL (ref 2.5–4.6)

## 2021-02-22 LAB — HIV ANTIBODY (ROUTINE TESTING W REFLEX): HIV Screen 4th Generation wRfx: NONREACTIVE

## 2021-02-22 LAB — ETHANOL: Alcohol, Ethyl (B): 413 mg/dL (ref <10)

## 2021-02-22 MED ORDER — DEXTROSE-NACL 5-0.45 % IV SOLN: INTRAVENOUS | Status: DC

## 2021-02-22 MED ORDER — LORAZEPAM 2 MG/ML IJ SOLN: 1.0000 mg | INTRAMUSCULAR | Status: DC | PRN

## 2021-02-22 MED ORDER — ADULT MULTIVITAMIN W/MINERALS CH
1.0000 | ORAL_TABLET | Freq: Every day | ORAL | Status: DC
  Administered 2021-02-23 – 2021-02-24 (×2): 1 via ORAL
  Filled 2021-02-22 (×2): qty 1

## 2021-02-22 MED ORDER — THIAMINE HCL 100 MG/ML IJ SOLN
100.0000 mg | Freq: Every day | INTRAMUSCULAR | Status: DC
  Administered 2021-02-22 – 2021-02-24 (×3): 100 mg via INTRAVENOUS
  Filled 2021-02-22 (×3): qty 2

## 2021-02-22 MED ORDER — FOLIC ACID 1 MG PO TABS
1.0000 mg | ORAL_TABLET | Freq: Every day | ORAL | Status: DC
  Administered 2021-02-22 – 2021-02-24 (×3): 1 mg via ORAL
  Filled 2021-02-22 (×3): qty 1

## 2021-02-22 MED ORDER — NALOXONE HCL 0.4 MG/ML IJ SOLN
0.4000 mg | Freq: Once | INTRAMUSCULAR | Status: AC
  Administered 2021-02-22: 0.4 mg via INTRAVENOUS
  Filled 2021-02-22: qty 1

## 2021-02-22 MED ORDER — LACTATED RINGERS IV SOLN: INTRAVENOUS | Status: DC

## 2021-02-22 MED ORDER — PROCHLORPERAZINE MALEATE 5 MG PO TABS
5.0000 mg | ORAL_TABLET | Freq: Once | ORAL | Status: AC
  Administered 2021-02-22: 5 mg via ORAL
  Filled 2021-02-22: qty 1

## 2021-02-22 MED ORDER — LORAZEPAM 1 MG PO TABS: 1.0000 mg | ORAL_TABLET | ORAL | Status: DC | PRN

## 2021-02-22 NOTE — TOC Initial Note (Signed)
Transition of Care [facility]) - Initial/Assessment Note    Patient Details  Name: Sheila Graves MRN: [id number] Date of Birth: 1981/06/06  [facility] of [facility]) CM/SW Contact:    Lockie Pares, RN Phone Number: 02/22/2021, 11:38 AM  Clinical Narrative:                 40 year old, sees Lavinia Sharps homeless, in with acute ETOH intoxication ETOH level was 504. History of pancreatitis, ETOH and withdrawal seizure.  Patient is from [place], no faily contacts listed. Has been interested in ETOH programs. Has been given resources prior on programs in the community.  Being admitted as she is still altered, CT head done. May need MATCH for medications and further resources     Barriers to Discharge: Inadequate or no insurance, Continued Medical Work up   Patient Goals and CMS Choice        Expected Discharge Plan and Services                                                Prior Living Arrangements/Services                       Activities of Daily Living      Permission Sought/Granted                  Emotional Assessment              Admission diagnosis:  Unresponsive Patient Active Problem List   Diagnosis Date Noted   Hypophosphatemia 07/31/2019   Alcohol use disorder, severe, dependence (HCC)    Prolonged QT interval 03/16/2019   Hypokalemia 02/04/2019   Major depressive disorder, recurrent, severe without psychotic features (HCC) 01/31/2018   PCP:  Lavinia Sharps, NP Pharmacy:   [facility] - [place], [place] - [address] [place]  [address] Phone: [phone] Fax: [phone]  [id number] - [place], [place] - [address] [place] [address] Phone: [phone] Fax: [phone]     Social Determinants of Health (SDOH) Interventions    Readmission Risk Interventions No flowsheet data found.

## 2021-02-22 NOTE — ED Provider Notes (Signed)
40 year old female signed to me by Dr. Clayborne Dana.  Patient here with severe alcohol intoxication with alcohol level of 509.  Attempted to arouse patient today unsuccessfully.  Will check head CT and if negative will admit to medicine service   Lorre Nick, MD 02/22/21 1007

## 2021-02-22 NOTE — ED Notes (Addendum)
Patient resting in stretcher comfortably. Eyes closed, Equal chest rise and fall.Call bell in reach, Stretcher in low and locked position. Side rails up x2.

## 2021-02-22 NOTE — ED Notes (Addendum)
This RN attempted to ambulate patient. Patient reporting dizziness, gait unsteady. Patient assisted back into bed. Water given to patient

## 2021-02-22 NOTE — H&P (Addendum)
Family Medicine Teaching [facility] Admission History and Physical Service Pager: [phone]  Patient name: Sheila Graves Medical record number: [id number] Date of birth: November 29, 1980 Age: 40 y.o. Gender: female  Primary Care Provider: Lavinia Sharps, NP Consultants: None Code Status: Full which was confirmed with patient Preferred Emergency Contact: None listed, unable to get from patient due to AMS  Chief Complaint: Severe alcohol intoxication, found unresponsive  Assessment and Plan: Sheila Graves is a 40 y.o. female presenting with altered mental status secondary to severe alcohol intoxication . PMH is significant for pancreatitis, alcohol abuse, withdrawal seizures.  AMS likely 2/2 Acute ETOH intoxication Patient is facing housing insecurity, and she has presented 9 times within the past 12 days for EtOH intoxication.  Per this admission, she presented to the ED on 8/26 with altered mental status after being found behind a restaurant unresponsive.  She had just been discharged on the same day (8/26) at 1606 for the same reason.  On arrival via EMS, she was responsive only to painful stimuli and she did not have any indication for intubation.  The plan per ED physician was to observe until she was able to ambulate and talk appropriately.  However, after remaining in the ED for 15 hours altered mental status continued, and so she was admitted for observation.  EtOH on arrival was 504.  CT head negative for any acute intracranial abnormality.  EKG normal sinus rhythm, 89 bpm, no QTC prolongation, no acute ST or T wave changes. ABG without any abnormalities on 8/25.  CK 183.  No electrolyte abnormalities on BMP.  Glucose 82.  CBC with no abnormalities.  Per our exam, patient awakens to turning on the lights in the room and sitting her up in the bed with tactile stimuli.  She is intermittently drowsy and falls asleep, but is able to answer questions, saying that she drank "a lot" of alcohol today.   Patient endorses passive suicidal ideation, with no plan.  She says "I have nothing to smile about." Patient requested a pure wick as she needed to urinate.  No concern at this time intracranial bleeding, as CT head was negative. Also considered CVA vs TIA though given severe ethanol elevation, suspect this is less likely. UDS also positive for benzo, polysubstance could be contributing.  -Admit to progressive, attending Dr. Manson Passey -Vital signs per floor -Cardiac monitoring -Continue LR at maintenance rate 150 mL/HR -Monitor mental status, neurochecks every 4 hours -Suicide precautions, 1:1 sitter -CIWA protocol with Ativan, will monitor for withdrawal seizures -TOC consult for substance abuse resources -Repeat UDS -Repeat Ethanol -HIV antibody -IV Thiamine -Folate daily -Mg, Phos -Repeat ABG if continues to be altered  Complicated social situation  Patient originally from [place], per chart review.  She has a wife named Vernona Rieger, who was imprisoned.  No emergency contacts on chart.  Patient says she has no family or friends.  She is facing housing insecurity.  Per TOC note, she is interested in alcohol programs and was provided with resources on programs in the community.  She sees Jaclynn Guarneri, NP as her PCP.  She also had a suitcase in the room that looked like it had all of her belongings. -Social work following, appreciate their care -Patient provided with resources on alcohol programs, we will discuss further with patient when she is more awake and alert  Depressed Affect History of MDD per chart-review, though does not appear to be on any anti-depressants. Would want to screen for bipolar disorder prior  to initiation of SSRI. Can further evaluate once she is more sober. She is easily tearful in room. Would benefit from psychiatry consultation.  -Psych consult for AM  -Suicide precautions   FEN/GI: Regular Prophylaxis: SCDs  Disposition: Progressive  History of Present  Illness:  Sheila Graves is a 40 y.o. female presenting with altered mental status secondary to excessive alcohol intoxication.  Level 5 caveat-due to altered mental status.  Patient says "I am here because I drink a lot of alcohol" and "I have nothing to smiled out" and "my wife is not present and I have no one."  Patient endorses thoughts of suicide, but has no active plan.  She said "I have not thought of a plan yet."  She would like to get back into rehab for alcohol detoxification.  Review Of Systems: Per HPI with the following additions:   Review of Systems  Reason unable to perform ROS: altered mental status.    Patient Active Problem List   Diagnosis Date Noted   Hypophosphatemia 07/31/2019   Alcohol use disorder, severe, dependence (HCC)    Prolonged QT interval 03/16/2019   Hypokalemia 02/04/2019   Major depressive disorder, recurrent, severe without psychotic features (HCC) 01/31/2018    Past Medical History: Past Medical History:  Diagnosis Date   Alcohol withdrawal seizure (HCC)    Alcoholic (HCC)    ETOH abuse    Hypokalemia    Pancreatitis    Thyroid disease     Past Surgical History: Past Surgical History:  Procedure Laterality Date   FINGER SURGERY     IRRIGATION AND DEBRIDEMENT SHOULDER Right 11/12/2018   Procedure: Irrigation And Debridement Shoulder;  Surgeon: Roby Lofts, MD;  Location: [facility] OR;  Service: Orthopedics;  Laterality: Right;   ORIF CLAVICULAR FRACTURE Right 11/12/2018   Procedure: OPEN REDUCTION INTERNAL FIXATION (ORIF) CLAVICULAR FRACTURE;  Surgeon: Roby Lofts, MD;  Location: [facility] OR;  Service: Orthopedics;  Laterality: Right;    Social History: Social History   Tobacco Use   Smoking status: Former    Packs/day: 1.00    Types: Cigarettes   Smokeless tobacco: Never  Vaping Use   Vaping Use: Never used  Substance Use Topics   Alcohol use: Yes   Drug use: Not Currently    Types: Heroin    Family History: Family History  Problem  Relation Age of Onset   Alcohol abuse Other      Allergies and Medications: No Known Allergies No current facility-administered medications on file prior to encounter.   Current Outpatient Medications on File Prior to Encounter  Medication Sig Dispense Refill   naproxen (NAPROSYN) 500 MG tablet Take 1 tablet (500 mg total) by mouth 2 (two) times daily. (Patient not taking: No sig reported) 30 tablet 0   potassium chloride (KLOR-CON) 10 MEQ tablet Take 2 tablets (20 mEq total) by mouth daily for 14 days. (Patient not taking: No sig reported) 28 tablet 0   potassium chloride SA (KLOR-CON) 20 MEQ tablet Take 1 tablet (20 mEq total) by mouth daily. (Patient not taking: No sig reported) 7 tablet 0   [DISCONTINUED] escitalopram (LEXAPRO) 10 MG tablet Take 1 tablet (10 mg total) by mouth daily. (Patient not taking: Reported on 09/16/2019) 30 tablet 0    Objective: BP 99/65   Pulse (!) 109   Resp 19   SpO2 96%  Exam: General: Sleepy, arouses to tactile and verbal stimuli, disheveled, smells of emesis Eyes: Pupils PERRL, sluggish to react ENTM: Dry mucous membranes,  cracked lips.  Poor dentition Neck: Normal range of motion, supple Cardiovascular: Regular rate and rhythm, no murmurs appreciated Respiratory: Normal work of breathing, breathing comfortably on room air   Gastrointestinal: Tenderness to palpation in epigastrium. Soft. Non-distended MSK: No joint deformities Derm: No scars or lesions or rashes, tattoos to bilateral forearms, no visible track marks Neuro: Altered mental status but oriented to person, time and situation. Stated she was at [facility].  Sluggish, slurred speech.  Opens mouth on command.  Weak bilateral grip strength secondary to poor effort.  Unable to follow other commands due to intermittent somnolence  Psych: Endorses passive suicidal ideations, no plan.  Easily tearful, depressed affect.  Labs and Imaging: CBC BMET  Recent Labs  Lab 02/21/21 2102   WBC 4.3  HGB 12.4  HCT 37.4  PLT 169   Recent Labs  Lab 02/21/21 2102  NA 138  K 4.0  CL 105  CO2 20*  BUN 15  CREATININE 0.82  GLUCOSE 82  CALCIUM 8.2*     EKG: Sinus rhythm, rate 89 bpm. No ST changes.  Darral Dash, DO 02/22/2021, 12:50 PM PGY-1, [facility] FPTS Intern pager: [phone], text pages welcome  FPTS Upper-Level Resident Addendum   I have independently interviewed and examined the patient. I have discussed the above with the original author and agree with their documentation. My edits for correction/addition/clarification are included where appropriate. Please see also any attending notes.   Sabino Dick, DO PGY-2, [facility] 02/22/2021 5:49 PM  FPTS Service pager: [phone] (text pages welcome through [employer])

## 2021-02-22 NOTE — ED Notes (Signed)
Patient resting in stretcher comfortably. Eyes closed, Equal chest rise and fall. Patient alert to verbal stimuli. Call bell in reach, Stretcher in low and locked position. Side rails up x2.   

## 2021-02-23 DIAGNOSIS — R2681 Unsteadiness on feet: Secondary | ICD-10-CM

## 2021-02-23 MED ORDER — FOLIC ACID 1 MG PO TABS: 1.0000 mg | ORAL_TABLET | Freq: Every day | ORAL | 0 refills | Status: DC

## 2021-02-23 MED ORDER — NALTREXONE HCL 50 MG PO TABS: 50.0000 mg | ORAL_TABLET | Freq: Every day | ORAL | 0 refills | Status: DC

## 2021-02-23 NOTE — Plan of Care (Signed)

## 2021-02-23 NOTE — Discharge Instructions (Addendum)
Dear Sheila Graves,   Thank you so much for allowing [place] to be part of your care!  You were admitted to [facility] for altered mental status. Your alcohol level was elevated which likely contributed to your change in mental status. We have prescribed a medication to help with your alcohol cravings. It will be important to avoid taking in excessive alcohol.     POST-HOSPITAL & CARE INSTRUCTIONS We have started a medication called naltrexone 50 mg to help with alcohol cessation. Please take this daily.  Please let PCP/Specialists know of any changes that were made.  Please see medications section of this packet for any medication changes.   DOCTOR'S APPOINTMENT & FOLLOW UP CARE INSTRUCTIONS  No future appointments.  RETURN PRECAUTIONS: Return to care if you begin to have dizziness or confusion.   Take care and be well!  Family Medicine Teaching Service Inpatient Team [place]  [facility]  [address] [place], [place] [address] [phone]                   Intensive Outpatient Programs  [facility]    [facility] [address]     [address] [place],  [place]     [place], [place] [phone]      [phone]  [facility]   [facility]  (Inpatient and outpatient)  [phone] (Suboxone and Methadone) [address]           [phone]           ADS: Alcohol & Drug Services    [facility] - Intensive Outpatient [address]     [address] [place], [place] [address]     [place], [place]  [phone]      [phone]  [facility] (Outpatient, Inpatient, Chemical  [facility] (Groups and Residental) (insurance only) [phone]    [place], [place]          [phone]       [facility]    [facility] (for caregivers and family) [address]     [address] [place],  [place]     [place], [place] [phone]      [phone]  Residential Treatment Programs  [facility]  [facility](2 years) Residential: 90 days)  [facility] ([facility]      [address] [place], [place]     [place], [place] [phone]      [phone] or [phone]  [facility]    [facility] [address]      [address] [place], [place]     [place], [place] [phone]      [phone]  [facility]   [facility]idential Treatment Services (RTS) [address]     [address] [place], [place] [address]     [place], [place] [phone]      [phone] Admissions: 8am-3pm M-F  [facility] Program: Residential Program [phone] Days)              ADATC: [facility]  [place], [place]     [place], [place]  [phone] or [phone]    (Walk in Hours over the weekend or by referral)   Mobil Crisis: [facility]:[phone] (for crisis response 24 hours a day)  If you  are in need of a shelter please call [facility] ([facility]) at [phone] between the hours of 9am-5pm Mon-Fri.  [facility] will contact all the local shelters to find openings.  Right now they are requiring people to quarantine at a hotel before they can go to an open bed but [facility] may be able to set that up as well.  They are not open on weekends.  On Monday-Friday morning at 8 am until 1 pm, you can also go to the [employer] (see below) to seek shelter in the [facility] prior to entering a shelter. You can also call the number provided (see the above paragraph) to seek placement into the program by calling [facility].   Interactive resource center [facility]) [address] [place], [place] [address] Phone: [phone] Fax: [phone]  For Free Breakfasts and Lunches 7 days a week you can go to: [facility] [address] [place], [place], [place] [address] Hours: Open today  8AM-5PM Phone: [phone] Breakfast: 6:30-7:30 am Lunch served: 10:40 am - 12:40pm         DAY [facility]) M-F 8am-3pm   [address] [place], [place] [address]   [phone] Services include: laundry, barbering, support groups, case management, phone  & computer access, showers, AA/NA mtgs, mental health/substance abuse nurse, job skills class, disability information, VA assistance, spiritual classes, etc.   [facility] [address] [place], [place]   [phone] Provides breakfast each weekday morning except Wednesdays, and an evening community meal every Friday. Access to showers is available during breakfast hours and telephones for seeking work are also provided. Also offers job referral and counseling for the homeless and unemployed.  HOMELESS SHELTERS [facility]   [employer] [phone] [address]     [facility] [address] [place], [place] [address]     [address], [place]  [phone]      [phone]  [facility]   [facility] [address]    [address] [place] [address]     [place], [place] [address] [phone]       [phone]  [facility] (women only) [address] [place], [place] [address] [phone]  [facility]: [facility]cy [facility] (overflow shelter: [address] [place], [place] check in at 6pm for placement in local shelter).  [address] [place], [place], [place] [address] Phone: [phone]  Crisis Services [facility]     [facility]   Crisis Services      [facility] [phone]. [address]     [address] [place], [place] [address]     [place], [place] [address]    [facility] Mobile Crisis Management -  [phone]

## 2021-02-23 NOTE — Plan of Care (Signed)

## 2021-02-23 NOTE — Progress Notes (Signed)
FMTS Brief Note In to see patient this AM. Reports she feels better, has been walking to bathroom. Would like to discharge to [facility] rehab with mother. Reports low mood, denies SI/HI, or plan. She is much more alert and appropriate this AM.  - Discontinue sitter, will monitor this AM.  - Plan for naltrexone on DC, discussed with patient - Discussed history of complicated withdrawal, has not had in years, has been on and off alcohol since that time. She feels well and would like to DC with mother today.  Will plan for discharge at lunch. SW to see this AM. Terisa Starr, MD  [facility] Medicine Teaching Service

## 2021-02-23 NOTE — Progress Notes (Addendum)
FPTS Brief Progress Note  S Saw patient at bedside this evening. Patient was resting comfortably in bed.  She feels a little weak and tremulous but no worse than earlier today. Denies auditory or visual hallucinations. She is happy to be discharged tomorrow.  O: BP 104/80 (BP Location: Right Arm)   Pulse 78   Temp 98.5 F (36.9 C) (Oral)   Resp 14   SpO2 100%    General: Alert, no acute distress, pleasant Cardio: Well-perfused Pulm: normal work of breathing Neuro: Cranial nerves grossly intact   A/P: Plan per day team  -Likely discharge tomorrow morning - Orders reviewed. Labs for AM not ordered, which was adjusted as needed.  - If condition changes, plan includes monitor CIWA's and as needed lorazepam.   Towanda Octave, MD 02/23/2021, 10:32 PM PGY-3, [facility] Night Resident  Please page [phone] with questions.

## 2021-02-23 NOTE — Care Management (Signed)
Patient provided with Good Rx coupon for medication, she states that she is not sure how she is getting to Dc destination, bus pass sent to unit, nurse messaged. Per TOC 8/27 patient already has ETOH resources.

## 2021-02-23 NOTE — Progress Notes (Signed)
Pt. Arrived to the floor from ED via stretcher accompanied by RN Toni Amend. Pt is alert and ambulatory, vital signs are stable, denies pain at this time. Pt was made comfortable in her room, call light is within reach. Sitter in the room.

## 2021-02-23 NOTE — Progress Notes (Signed)
Around 1800, received page from nurse that patient felt that she was too weak to be discharged at this time.  Went to see patient, who again stated she did not feel strong enough to be discharged at this time.  Patient states she was able to ambulate with PT without assistive devices but "not get very far".  Patient was able to ambulate in room slowly but appropriately 5 feet from bed and back witnessed by myself.  Patient stated she originally felt she would be able to be strong enough to leave earlier this morning but believes another nights rest will allow her to feel strong enough to leave tomorrow morning.  Patient has been able to tolerate oral intake.  I discussed with nurse that I would discontinue fluids as she is able to tolerate p.o. appropriately and will discharge tomorrow.

## 2021-02-23 NOTE — Hospital Course (Addendum)
Kitiara Hintze is a 40 year old with a history of alcohol use disorder and MDD who was admitted to the [facility] Teaching Service at [facility] for altered mental status 2/2 intoxication. Her hospital course is detailed below:  AMS 2/2 acute EtOH intoxication Presented to the ED on 8/26 with AMS after being found behind a restaurant unresponsive, responsive only to painful stimuli.  EtOH level 504.  She was presented 9 times within the past 12 days for alcohol intoxication.  CT head negative for acute intracranial abnormality.  EKG normal sinus rhythm without other abnormalities.  ABG, BMP, CBC WNL.  UDS positive for benzos.  Glucose 82.  CIWA scores 0x3 prior to discharge.  Patient has detoxed several times on outpatient basis without difficulty.  PT evaluated patient for discharge noted she was able to ambulate 250 feet without assistive device. Patient was discharged on naltrexone and given shelter alcohol outpatient information.  PCP follow-up recommendations Follow-up alcohol cessation plan

## 2021-02-23 NOTE — Progress Notes (Addendum)
Physical Therapy Treatment and Discharge Patient Details Name: Sheila Graves MRN: [id number] DOB: 12-Jul-1980 Today's Date: 02/23/2021    History of Present Illness Pt is a 40 y.o. F who presents with AMS due to severe alcohol intoxication. PMH significant for: pancreatitis, alcohol abuse, withdrawal seizures.    PT Comments    Patient evaluated by Physical Therapy with no further acute PT needs identified. Pt performing transfers and ambulating 250 feet with no assistive device modI. HR 82-110 bpm, SpO2 100% on RA. Pt reports low back pain/stiffness; heat pack applied. Presents with mild dynamic balance deficits and decreased endurance, but otherwise is mobilizing without physical difficulty. All education has been completed and the patient has no further questions. No follow-up Physical Therapy or equipment needs. PT is signing off. Thank you for this referral.    Follow Up Recommendations  No PT follow up     Equipment Recommendations  None recommended by PT    Recommendations for Other Services       Precautions / Restrictions Precautions Precautions: None Restrictions Weight Bearing Restrictions: No    Mobility  Bed Mobility Overal bed mobility: Independent                  Transfers Overall transfer level: Independent Equipment used: None                Ambulation/Gait Ambulation/Gait assistance: Modified independent (Device/Increase time) Gait Distance (Feet): 250 Feet Assistive device: None Gait Pattern/deviations: Step-through pattern;Decreased dorsiflexion - right;Decreased dorsiflexion - left Gait velocity: decreased   General Gait Details: Pt with slightly guarded gait, cues for scapular depression, decreased bilateral heel strike. No gross instability. Able to perform head turns, slows down to step over obstacles   Stairs             Wheelchair Mobility    Modified Rankin (Stroke Patients Only)       Balance Overall balance  assessment: Mild deficits observed, not formally tested                                          Cognition Arousal/Alertness: Awake/alert Behavior During Therapy: WFL for tasks assessed/performed Overall Cognitive Status: Within Functional Limits for tasks assessed                                        Exercises      General Comments        Pertinent Vitals/Pain Pain Assessment: Faces Faces Pain Scale: Hurts little more Pain Location: low back Pain Descriptors / Indicators: Aching Pain Intervention(s): Monitored during session;Heat applied    Home Living Family/patient expects to be discharged to:: Unsure                    Prior Function Level of Independence: Independent          PT Goals (current goals can now be found in the care plan section) Acute Rehab PT Goals Patient Stated Goal: did not state PT Goal Formulation: All assessment and education complete, DC therapy    Frequency           PT Plan      Co-evaluation              AM-PAC PT "6 Clicks" Mobility   Outcome Measure  Help needed turning from your back to your side while in a flat bed without using bedrails?: None Help needed moving from lying on your back to sitting on the side of a flat bed without using bedrails?: None Help needed moving to and from a bed to a chair (including a wheelchair)?: None Help needed standing up from a chair using your arms (e.g., wheelchair or bedside chair)?: None Help needed to walk in hospital room?: None Help needed climbing 3-5 steps with a railing? : A Little 6 Click Score: 23    End of Session   Activity Tolerance: Patient tolerated treatment well Patient left: in bed;with call bell/phone within reach   PT Visit Diagnosis: Difficulty in walking, not elsewhere classified (R26.2)     Time: 2671-2458 PT Time Calculation (min) (ACUTE ONLY): 15 min  Charges:   PT Evaluation $PT Eval Low Complexity: 1  Low                     Lillia Pauls, PT, DPT Acute Rehabilitation Services Pager [phone] Office [phone]    Norval Morton 02/23/2021, 3:38 PM

## 2021-02-23 NOTE — Plan of Care (Signed)
  Problem: Coping: Goal: Level of anxiety will decrease Outcome: Progressing   Problem: Pain Managment: Goal: General experience of comfort will improve Outcome: Progressing   Problem: Safety: Goal: Ability to remain free from injury will improve Outcome: Progressing   Problem: Skin Integrity: Goal: Risk for impaired skin integrity will decrease Outcome: Progressing   

## 2021-02-23 NOTE — Discharge Summary (Addendum)
Family Medicine Teaching [facility] Discharge Summary  Patient name: Sheila Graves Medical record number: [id number] Date of birth: [birth year] Age: 40 y.o. Gender: female Date of Admission: 02/21/2021  Date of Discharge: 02/24/21 Admitting Physician: Darral Dash, DO  Primary Care Provider: Lavinia Sharps, NP Consultants: None  Indication for Hospitalization: severe alcohol intoxication  Discharge Diagnoses/Problem List:  Principal Problem:   Acute encephalopathy Active Problems:   Major depressive disorder, recurrent, severe without psychotic features (HCC)   Hypophosphatemia   Alcohol intoxication (HCC)   Disposition: Home  Discharge Condition: Stable  Patient seen and examined 8/29. She reports she feels strong, gait has improved. She is still interested in naltrexone. Will be going to outpatient rehabilitation. CIWA 0 this AM.  Discharge Exam:  Blood pressure 106/80, pulse 72, temperature 98 F (36.7 C), resp. rate 12, SpO2 100 %.  General: Awake, alert and appropriately responsive in NAD HEENT: EOMI, PERRL.  Chest: CTAB, normal work of breathing Heart: RRR, no murmur appreciated Abdomen: Soft, non-tender, non-distended. Normoactive bowel sounds Extremities: Moves all extremities equally. MSK: Normal bulk and tone Neuro: Appropriately responsive to stimuli. No gross deficits appreciated. Normal in conversation Skin: No rashes or lesions appreciated.    Brief Hospital Course:  Sheila Graves is a 40 year old with a history of alcohol use disorder and MDD who was admitted to the [facility] Teaching Service at [facility] for altered mental status 2/2 intoxication. Her hospital course is detailed below:  AMS 2/2 acute EtOH intoxication Presented to the ED on 8/26 with AMS after being found behind a restaurant unresponsive, responsive only to painful stimuli.  EtOH level 504.  She has presented 9 times within the past 12 days for alcohol intoxication.  CT head negative for  acute intracranial abnormality.  EKG normal sinus rhythm without other abnormalities.  ABG, BMP, CBC all were WNL.  UDS was positive for benzos.  Glucose 82.  CIWA scores 0x3 prior to discharge and on day of discharge.   Patient has detoxed several times on outpatient basis without difficulty.  PT evaluated patient for discharge noted she was able to ambulate 250 feet without assistive device. She had some weakness later that evening and was re-evaluated on 8/29. On AM of 8/29, she was doing well. CIWA 0, eating, drinking water, and ambulating around room. Patient was discharged on naltrexone and given homeless shelter & alcohol outpatient information.  PCP follow-up recommendations Follow-up alcohol cessation plan  Significant Procedures: None  Significant Labs and Imaging:  Recent Labs  Lab 02/20/21 2157 02/20/21 2305 02/21/21 2102 02/22/21 1803  WBC 5.1  --  4.3  --   HGB 12.8 11.9* 12.4 11.9*  HCT 39.9 35.0* 37.4 35.0*  PLT 172  --  169  --    Recent Labs  Lab 02/20/21 2157 02/20/21 2305 02/21/21 2102 02/22/21 1433 02/22/21 1803  NA 140 148* 138  --  144  K 3.6 3.5 4.0  --  3.7  CL 105  --  105  --   --   CO2 24  --  20*  --   --   GLUCOSE 100*  --  82  --   --   BUN 12  --  15  --   --   CREATININE 0.77  --  0.82  --   --   CALCIUM 8.4*  --  8.2*  --   --   MG  --   --   --  1.9  --  PHOS  --   --   --  3.1  --   ALKPHOS 77  --   --   --   --   AST 103*  --   --   --   --   ALT 78*  --   --   --   --   ALBUMIN 3.8  --   --   --   --     Results/Tests Pending at Time of Discharge: None  Discharge Medications:  Allergies as of 02/24/2021   No Known Allergies      Medication List     STOP taking these medications    naproxen 500 MG tablet Commonly known as: NAPROSYN   potassium chloride 10 MEQ tablet Commonly known as: KLOR-CON   potassium chloride SA 20 MEQ tablet Commonly known as: KLOR-CON       TAKE these medications    naltrexone 50 MG  tablet Commonly known as: DEPADE Take 1 tablet (50 mg total) by mouth daily.        Discharge Instructions: Please refer to Patient Instructions section of EMR for full details.  Patient was counseled important signs and symptoms that should prompt return to medical care, changes in medications, dietary instructions, activity restrictions, and follow up appointments.   Follow-Up Appointments:  Follow-up Information     Placey, Chales Abrahams, NP .   Contact information: [address] [place] [address] [phone]                Westley Chandler, MD 02/24/2021, 2:05 PM PGY-1, [facility] Family Medicine

## 2021-02-23 NOTE — Progress Notes (Signed)
FPTS Interim Progress Note  Called by the ED nurse in the earlier part of the night as patient was feeling nauseous after eating food that was sitting out since earlier in the afternoon, ordered compazine given history of QTc prolongation. Nausea has improved. Vitals and chart reviewed. Given hypothermia, instructed ED nurse to use blankets. Went to evaluate patient at bedside during night rounds. Patient had just been transferred to her room from the ED. She was in the restroom upon my arrival. Spoke with the nurse who denies any concerns. Emphasized the need for sitter given previous suicidal ideations. Informed her to notify primary team if any concerns. CIWAs thus far have been appropriate and not demonstrating severe withdrawal symptoms. Will continue to monitor. ABG appropriate and [place] positive for benzos.   Reece Leader, DO 02/23/2021, 2:11 AM PGY-2, [facility] Family Medicine Service pager [phone]

## 2021-02-23 NOTE — Progress Notes (Signed)
Reached out to on call provider about the patient keeping her phone since she's on suicide precations? MD stated that it's ok for pt to keep her phone, and have next shift evaluate the situation.

## 2021-02-23 NOTE — Progress Notes (Signed)
Family Medicine Teaching Service Daily Progress Note Intern Pager: [phone]  Patient name: Sheila Graves Medical record number: [id number] Date of birth: [birth year] Age: 40 y.o. Gender: female  Primary Care Provider: Lavinia Sharps, NP Consultants: None Code Status: Full  Pt Overview and Major Events to Date:  8/27: admitted for severe alcohol intoxication   Assessment and Plan: Sheila Graves is a 40 y/o F presenting w/ AMS 2/2 severe alcohol intoxication. PMHx significant for pancreatitis, alcohol abuse, withdrawal seizures.   AMS likely 2/2 acute EtOH intoxication Presented to the ED on 8/26 with AMS after being found by restaurant unresponsive, responsive only to painful stimuli. EtOH level 504. She is presented 9 times within the past 12 days for alcohol intoxication.  CT head negative for acute intracranial abnormality.  EKG showed NSR, no QTC prolongation, no acute ST or T wave changes.  ABG, BMP, CBC WNL.  UDS positive for benzo.  Patient is oriented at this time and stated she is interested in leaving hospital. -CIWA protocol with Ativan, monitor for withdrawal -Social work on board for substance abuse resources -Continue thiamine, folate, multivitamin, D5 1/2NS at 112mL/hr -Naltrexone on discharge -PT/OT eval  Nausea: Resolved Nauseous overnight after eating food that was sitting out since afternoon.  Compazine given due to history of QTC prolongation, although not QT prolonged on admission.  Patient tolerated breakfast well without nausea.  Social situation Wife, Vernona Rieger, is currently imprisoned.  No emergency contacts on chart.  Patient has no family or friends at bases housing security.  Patient will be able to be discharged to mother but will not be able to be picked up today therefore does not have safe discharge. -Social work following, appreciate assistance  Depression History of MDD on chart review, not actively on any depressants.  Patient mood is improved. -Psych  consulted, will see this morning, appreciate recommendations -Discontinue sitter  FEN/GI: Regular PPx: SCDs Dispo:Home tomorrow. Barriers include PT/OT eval and safe discharge plan.   Subjective:  Patient is improved today with returned full mental status.  She states she has had withdrawals in the past.  She is currently feeling well and eating.  She is interested in going home when able.  Objective: Temp:  [97.3 F (36.3 C)-99.3 F (37.4 C)] 99 F (37.2 C) (08/28 0723) Pulse Rate:  [74-109] 96 (08/28 0723) Resp:  [11-23] 11 (08/28 0723) BP: (90-110)/(52-77) 104/68 (08/28 0723) SpO2:  [92 %-100 %] 97 % (08/28 0723) Physical Exam: General: Awake, alert, no acute distress, appropriate in conversation Cardiovascular: RRR, no murmurs auscultated Respiratory: CTA B, no increased work of breathing Abdomen: Soft, nontender, normoactive bowel sounds Extremities: Moves all extremities equally, no visible tremors  Laboratory: Recent Labs  Lab 02/20/21 2157 02/20/21 2305 02/21/21 2102 02/22/21 1803  WBC 5.1  --  4.3  --   HGB 12.8 11.9* 12.4 11.9*  HCT 39.9 35.0* 37.4 35.0*  PLT 172  --  169  --    Recent Labs  Lab 02/20/21 2157 02/20/21 2305 02/21/21 2102 02/22/21 1803  NA 140 148* 138 144  K 3.6 3.5 4.0 3.7  CL 105  --  105  --   CO2 24  --  20*  --   BUN 12  --  15  --   CREATININE 0.77  --  0.82  --   CALCIUM 8.4*  --  8.2*  --   PROT 6.7  --   --   --   BILITOT 0.5  --   --   --  ALKPHOS 77  --   --   --   ALT 78*  --   --   --   AST 103*  --   --   --   GLUCOSE 100*  --  82  --     Imaging/Diagnostic Tests: No new imaging/diagnostic tests at this time.  Shelby Mattocks, DO 02/23/2021, 7:49 AM PGY-1,  Family Medicine FPTS Intern pager: [phone], text pages welcome

## 2021-02-24 ENCOUNTER — Other Ambulatory Visit (HOSPITAL_COMMUNITY): Payer: Self-pay

## 2021-02-24 DIAGNOSIS — G9340 Encephalopathy, unspecified: Principal | ICD-10-CM

## 2021-02-24 DIAGNOSIS — F332 Major depressive disorder, recurrent severe without psychotic features: Secondary | ICD-10-CM

## 2021-02-24 MED ORDER — NALTREXONE HCL 50 MG PO TABS
50.0000 mg | ORAL_TABLET | Freq: Every day | ORAL | 0 refills | Status: DC
  Filled 2021-02-24: qty 30, 30d supply, fill #0

## 2021-02-24 NOTE — TOC Transition Note (Signed)
Transition of Care [facility]) - CM/SW Discharge Note   Patient Details  Name: Sheila Graves MRN: [id number] Date of Birth: 09-08-1980  Transition of [facility]) CM/SW Contact:  Lorri Frederick, LCSW Phone Number: 02/24/2021, 9:38 AM   Clinical Narrative:   CSW asked to assist with naltrexone medication for pt.  Pt has already used MATCH last year, good RX would cost $30.  CSW spoke with pt who reports she is scheduled for admission to residential substance abuse treatment at [facility] program in [place] on Wednesday, cannot be on medication for that program and would like to cancel naltrexone order.  Pt reports her father will set up transportation for her home from the hospital and help her get to [place] as well.  No other needs identified.      Final next level of care: Other (comment) (substance abuse residential program.) Barriers to Discharge: Barriers Resolved   Patient Goals and CMS Choice        Discharge Placement                       Discharge Plan and Services                DME Arranged: N/A         HH Arranged: NA          Social Determinants of Health (SDOH) Interventions     Readmission Risk Interventions No flowsheet data found.

## 2021-02-24 NOTE — Progress Notes (Signed)
Received notification after discharge order was placed. RN stating that patient declined RX for naltrexone 50mg  daily. Patient reported that she would be checking into rehab facility on 8/31 and would not need the medication prior to her admission.

## 2021-02-24 NOTE — Evaluation (Signed)
Occupational Therapy Evaluation Patient Details Name: Sheila Graves MRN: [id number] DOB: 09-Jul-1980 Today's Date: 02/24/2021    History of Present Illness Pt is a 40 y.o. F who presents with AMS due to severe alcohol intoxication. PMH significant for: pancreatitis, alcohol abuse, withdrawal seizures.   Clinical Impression   Patient evaluated by Occupational Therapy with no further acute OT needs identified. All education has been completed and the patient has no further questions.  See below for any follow-up Occupational Therapy or equipment needs. OT is signing off. Thank you for this referral.     Follow Up Recommendations  No OT follow up    Equipment Recommendations  None recommended by OT    Recommendations for Other Services       Precautions / Restrictions Precautions Precaution Comments: CIWA Restrictions Weight Bearing Restrictions: No      Mobility Bed Mobility Overal bed mobility: Independent                  Transfers Overall transfer level: Independent Equipment used: None                  Balance Overall balance assessment: Independent                                         ADL either performed or assessed with clinical judgement   ADL Overall ADL's : Independent;At baseline                                       General ADL Comments: Pt is able to perform all basic ADLs as well as IADLs including cooking/cleaning/laundry with rest breaks as needed. Pt demonstrated shower and toilet transfers independent, LE dressing independent, and in-room ambulation and mobility independently.     Vision Baseline Vision/History: 0 No visual deficits Patient Visual Report: No change from baseline Vision Assessment?: No apparent visual deficits     Perception     Praxis      Pertinent Vitals/Pain Pain Assessment: 0-10 Pain Score: 5  Pain Location: Stomach Pain Descriptors / Indicators: Tender Pain  Intervention(s): Monitored during session     Hand Dominance Right   Extremity/Trunk Assessment Upper Extremity Assessment Upper Extremity Assessment: Overall WFL for tasks assessed   Lower Extremity Assessment Lower Extremity Assessment: Overall WFL for tasks assessed   Cervical / Trunk Assessment Cervical / Trunk Assessment: Normal   Communication Communication Communication: No difficulties   Cognition Arousal/Alertness: Awake/alert Behavior During Therapy: WFL for tasks assessed/performed Overall Cognitive Status: Within Functional Limits for tasks assessed                                 General Comments: Pleasant and forthcoming regarding alcohol dependence   General Comments  Pt scored a 12 on the 30 second Sit to Stand test which is below pt's age/gender norms. Pt endorsed feeling tired during test especially to LEs with some pain.  Pt educated on pacing herself, sitting as needed for short periods and avoidance of over exhaustion. Increased safety with dynamic standing tasks due to mildly elevated fall risk per test.    Exercises     Shoulder Instructions      Home Living Family/patient expects to be discharged to:: Shelter/Homeless  Additional Comments: Pt reports that she has a wife, but not on good terms and pt is esentially homeless for now. Pt reports that her father, who lives in [place], is setting her up in a hotel with elevator access until Wednesday when pt goes into 90 day tretment center in [place].      Prior Functioning/Environment Level of Independence: Independent                 OT Problem List: Decreased activity tolerance      OT Treatment/Interventions:      OT Goals(Current goals can be found in the care plan section) Acute Rehab OT Goals Patient Stated Goal: To go to Treatment Center for ETOH cessation asap. OT Goal Formulation: All assessment and education complete, DC  therapy Potential to Achieve Goals: Good  OT Frequency:     Barriers to D/C:            Co-evaluation              AM-PAC OT "6 Clicks" Daily Activity     Outcome Measure Help from another person eating meals?: None Help from another person taking care of personal grooming?: None Help from another person toileting, which includes using toliet, bedpan, or urinal?: None Help from another person bathing (including washing, rinsing, drying)?: None Help from another person to put on and taking off regular upper body clothing?: None Help from another person to put on and taking off regular lower body clothing?: None 6 Click Score: 24   End of Session Equipment Utilized During Treatment:  (none) Nurse Communication: Other (comment) (Pt reports lost wallet since hospital. Alerted nursing staff to this who verbalized plan to follow up with lost and found.)  Activity Tolerance: Patient tolerated treatment well Patient left:  (Sitting EOB)  OT Visit Diagnosis: Pain;History of falling (Z91.81) Pain - part of body:  (stomach)                Time: 5638-7564 OT Time Calculation (min): 15 min Charges:  OT General Charges $OT Visit: 1 Visit OT Evaluation $OT Eval Low Complexity: 1 Low  Sheila Graves, OT Acute Rehab Services Office: [phone] 02/24/2021  Theodoro Clock 02/24/2021, 8:54 AM

## 2021-02-24 NOTE — Progress Notes (Addendum)
Patient discharging to rehab facility. Discharge instructions given and verbal understanding returned. Patient refused new medication and refused the delivery of the new medication due to not needing medication in the alcohol rehab facility. Informed MD. Patient stated she will start at the alcohol rehab facility on Wednesday. No questions at this time.

## 2021-03-22 ENCOUNTER — Encounter (HOSPITAL_BASED_OUTPATIENT_CLINIC_OR_DEPARTMENT_OTHER): Payer: Self-pay

## 2021-03-22 ENCOUNTER — Emergency Department (HOSPITAL_BASED_OUTPATIENT_CLINIC_OR_DEPARTMENT_OTHER)
Admission: EM | Admit: 2021-03-22 | Discharge: 2021-03-22 | Disposition: A | Discharge status: Home / Self Care | Payer: Self-pay | Attending: Emergency Medicine | Admitting: Emergency Medicine

## 2021-03-22 ENCOUNTER — Other Ambulatory Visit: Payer: Self-pay

## 2021-03-22 DIAGNOSIS — Z87891 Personal history of nicotine dependence: Secondary | ICD-10-CM | POA: Insufficient documentation

## 2021-03-22 DIAGNOSIS — S61215A Laceration without foreign body of left ring finger without damage to nail, initial encounter: Secondary | ICD-10-CM | POA: Insufficient documentation

## 2021-03-22 DIAGNOSIS — W274XXA Contact with kitchen utensil, initial encounter: Secondary | ICD-10-CM | POA: Insufficient documentation

## 2021-03-22 DIAGNOSIS — Z23 Encounter for immunization: Secondary | ICD-10-CM | POA: Insufficient documentation

## 2021-03-22 DIAGNOSIS — Y93G9 Activity, other involving cooking and grilling: Secondary | ICD-10-CM | POA: Insufficient documentation

## 2021-03-22 DIAGNOSIS — Y990 Civilian activity done for income or pay: Secondary | ICD-10-CM | POA: Insufficient documentation

## 2021-03-22 MED ORDER — TETANUS-DIPHTH-ACELL PERTUSSIS 5-2.5-18.5 LF-MCG/0.5 IM SUSY
0.5000 mL | PREFILLED_SYRINGE | Freq: Once | INTRAMUSCULAR | Status: AC
  Administered 2021-03-22: 0.5 mL via INTRAMUSCULAR
  Filled 2021-03-22: qty 0.5

## 2021-03-22 NOTE — ED Triage Notes (Addendum)
Pt was at work at [employer] when she accidentally sliced her left ring finger with meat slicer. Compression has stabilized the bleeding. Pt attempted to continue to work with band aid but it was just bleeding too much. Not UTD on tetanus. RN irrigated wound and dressed in non-adherent with gauze and coban.

## 2021-03-22 NOTE — ED Provider Notes (Signed)
[facility] EMERGENCY DEPT Provider Note   CSN: [id number] Arrival date & time: 03/22/21  2019     History Chief Complaint  Patient presents with   Laceration    Sheila Graves is a 40 y.o. female.   Laceration Associated symptoms: no fever   Patient presents after laceration to finger.  Is on her left ring finger that she sliced with a meat slicer at [employer].  Had bandage placed.  Tetanus not up-to-date.  No other injury.  No numbness.    Past Medical History:  Diagnosis Date   Alcohol withdrawal seizure (HCC)    Alcoholic (HCC)    ETOH abuse    Hypokalemia    Pancreatitis    Thyroid disease     Patient Active Problem List   Diagnosis Date Noted   Alcohol intoxication (HCC) 02/22/2021   Acute encephalopathy 02/22/2021   Hypophosphatemia 07/31/2019   Alcohol use disorder, severe, dependence (HCC)    Prolonged QT interval 03/16/2019   Hypokalemia 02/04/2019   Major depressive disorder, recurrent, severe without psychotic features (HCC) 01/31/2018    Past Surgical History:  Procedure Laterality Date   FINGER SURGERY     IRRIGATION AND DEBRIDEMENT SHOULDER Right 11/12/2018   Procedure: Irrigation And Debridement Shoulder;  Surgeon: Roby Lofts, MD;  Location: [facility] OR;  Service: Orthopedics;  Laterality: Right;   ORIF CLAVICULAR FRACTURE Right 11/12/2018   Procedure: OPEN REDUCTION INTERNAL FIXATION (ORIF) CLAVICULAR FRACTURE;  Surgeon: Roby Lofts, MD;  Location: [facility] OR;  Service: Orthopedics;  Laterality: Right;     OB History   No obstetric history on file.     Family History  Problem Relation Age of Onset   Alcohol abuse Other     Social History   Tobacco Use   Smoking status: Former    Packs/day: 1.00    Types: Cigarettes   Smokeless tobacco: Never  Vaping Use   Vaping Use: Never used  Substance Use Topics   Alcohol use: Yes   Drug use: Not Currently    Types: Heroin    Home Medications Prior to Admission medications    Medication Sig Start Date End Date Taking? Authorizing Provider  naltrexone (DEPADE) 50 MG tablet Take 1 tablet (50 mg total) by mouth daily. 02/24/21   Simmons-Robinson, Makiera, MD  escitalopram (LEXAPRO) 10 MG tablet Take 1 tablet (10 mg total) by mouth daily. Patient not taking: Reported on 09/16/2019 03/22/19 11/16/19  Eddie North, MD    Allergies    Patient has no known allergies.  Review of Systems   Review of Systems  Constitutional:  Negative for chills and fever.  Musculoskeletal:  Negative for back pain.  Skin:  Positive for wound.  Neurological:  Negative for weakness and numbness.  Hematological:  Does not bruise/bleed easily.   Physical Exam Updated Vital Signs BP 112/83 (BP Location: Right Arm)   Pulse 70   Temp 98 F (36.7 C)   Resp 17   Ht 5\' 11"  (1.803 m)   Wt 65.8 kg   LMP 03/15/2021   SpO2 98%   BMI 20.22 kg/m   Physical Exam Vitals reviewed.  Eyes:     Pupils: Pupils are equal, round, and reactive to light.  Cardiovascular:     Rate and Rhythm: Regular rhythm.  Musculoskeletal:        General: Tenderness and signs of injury present.     Cervical back: Neck supple.  Skin:    Coloration: Skin is not pale.  Comments:   Not on blood thinners.  Laceration on the distal phalanx of the left ring finger.  On medial aspect.  Approximately 1.5 cm long and 3 mm wide.  There is a piece of laceration attached distally but proximally there is no skin present.  Appears to have been amputated from the slicer.  However bone not palpated.  However does not approximate well.  Does not involve the joint.  Sensation intact distally.  Neurological:     Mental Status: She is alert.    ED Results / Procedures / Treatments   Labs (all labs ordered are listed, but only abnormal results are displayed) Labs Reviewed - No data to display  EKG None  Radiology No results found.  Procedures Procedures   Medications Ordered in ED Medications  Tdap (BOOSTRIX)  injection 0.5 mL (0.5 mLs Intramuscular Given 03/22/21 2203)    ED Course  I have reviewed the triage vital signs and the nursing notes.  Pertinent labs & imaging results that were available during my care of the patient were reviewed by me and considered in my medical decision making (see chart for details).    MDM Rules/Calculators/A&P                           Patient with finger laceration on meat slicer.  Does not appear good candidate for closing.  There is 1 piece of tissue that is still on there but do not think that suture will really help.  Dressing placed.  Since inferior to this does not have the tissue to suture will be closing by secondary intention anyway.  Topical treatment with bacitracin.  Discharge home with outpatient follow-up as needed. Final Clinical Impression(s) / ED Diagnoses Final diagnoses:  Laceration of left ring finger without foreign body without damage to nail, initial encounter    Rx / DC Orders ED Discharge Orders     None        Benjiman Core, MD 03/22/21 2350

## 2021-08-12 ENCOUNTER — Emergency Department (HOSPITAL_BASED_OUTPATIENT_CLINIC_OR_DEPARTMENT_OTHER)
Admission: EM | Admit: 2021-08-12 | Discharge: 2021-08-12 | Discharge status: Against Medical Advice | Payer: Self-pay | Attending: Emergency Medicine | Admitting: Emergency Medicine

## 2021-08-12 ENCOUNTER — Encounter (HOSPITAL_BASED_OUTPATIENT_CLINIC_OR_DEPARTMENT_OTHER): Payer: Self-pay

## 2021-08-12 DIAGNOSIS — F410 Panic disorder [episodic paroxysmal anxiety] without agoraphobia: Secondary | ICD-10-CM | POA: Insufficient documentation

## 2021-08-12 DIAGNOSIS — F419 Anxiety disorder, unspecified: Secondary | ICD-10-CM | POA: Insufficient documentation

## 2021-08-12 DIAGNOSIS — Z5321 Procedure and treatment not carried out due to patient leaving prior to being seen by health care provider: Secondary | ICD-10-CM | POA: Insufficient documentation

## 2021-08-12 NOTE — ED Triage Notes (Signed)
States had a near [facility] and since been upset and anxious. Feeling light headed. Appears tired.  States has not been sleeping and having lots of stress in her life and at work

## 2021-08-12 NOTE — ED Triage Notes (Signed)
Pt BIB [facility] from work d/t a panic attack. Pt endorses a lot of life stressors, requesting transport away from her work environment. Pt denies HI/SI  BP 130/72 HR 102 99% RA CBG 109

## 2021-08-26 ENCOUNTER — Encounter (HOSPITAL_COMMUNITY): Payer: Self-pay

## 2021-08-26 ENCOUNTER — Inpatient Hospital Stay (HOSPITAL_COMMUNITY)
Admission: EM | Admit: 2021-08-26 | Discharge: 2021-08-28 | DRG: 683 | Disposition: A | Discharge status: Home / Self Care | Payer: Self-pay | Attending: Family Medicine | Admitting: Family Medicine

## 2021-08-26 ENCOUNTER — Other Ambulatory Visit: Payer: Self-pay

## 2021-08-26 DIAGNOSIS — E876 Hypokalemia: Principal | ICD-10-CM | POA: Diagnosis present

## 2021-08-26 DIAGNOSIS — F1020 Alcohol dependence, uncomplicated: Secondary | ICD-10-CM | POA: Diagnosis present

## 2021-08-26 DIAGNOSIS — F1010 Alcohol abuse, uncomplicated: Secondary | ICD-10-CM

## 2021-08-26 DIAGNOSIS — E871 Hypo-osmolality and hyponatremia: Secondary | ICD-10-CM

## 2021-08-26 DIAGNOSIS — Z20822 Contact with and (suspected) exposure to covid-19: Secondary | ICD-10-CM | POA: Diagnosis present

## 2021-08-26 DIAGNOSIS — N179 Acute kidney failure, unspecified: Principal | ICD-10-CM | POA: Diagnosis present

## 2021-08-26 DIAGNOSIS — E039 Hypothyroidism, unspecified: Secondary | ICD-10-CM | POA: Diagnosis present

## 2021-08-26 DIAGNOSIS — M24542 Contracture, left hand: Secondary | ICD-10-CM | POA: Diagnosis present

## 2021-08-26 DIAGNOSIS — R197 Diarrhea, unspecified: Secondary | ICD-10-CM | POA: Diagnosis present

## 2021-08-26 DIAGNOSIS — F10229 Alcohol dependence with intoxication, unspecified: Secondary | ICD-10-CM | POA: Diagnosis present

## 2021-08-26 DIAGNOSIS — Z79899 Other long term (current) drug therapy: Secondary | ICD-10-CM

## 2021-08-26 DIAGNOSIS — Z87891 Personal history of nicotine dependence: Secondary | ICD-10-CM

## 2021-08-26 DIAGNOSIS — M24541 Contracture, right hand: Secondary | ICD-10-CM | POA: Diagnosis present

## 2021-08-26 LAB — CBC WITH DIFFERENTIAL/PLATELET
Abs Immature Granulocytes: 1.3 10*3/uL — ABNORMAL HIGH (ref 0.00–0.07)
Band Neutrophils: 23 %
Basophils Absolute: 0 10*3/uL (ref 0.0–0.1)
Basophils Relative: 0 %
Eosinophils Absolute: 0 10*3/uL (ref 0.0–0.5)
Eosinophils Relative: 0 %
HCT: 33.5 % — ABNORMAL LOW (ref 36.0–46.0)
Hemoglobin: 11.9 g/dL — ABNORMAL LOW (ref 12.0–15.0)
Lymphocytes Relative: 14 %
Lymphs Abs: 0.8 10*3/uL (ref 0.7–4.0)
MCH: 29.2 pg (ref 26.0–34.0)
MCHC: 35.5 g/dL (ref 30.0–36.0)
MCV: 82.3 fL (ref 80.0–100.0)
Metamyelocytes Relative: 9 %
Monocytes Absolute: 0.3 10*3/uL (ref 0.1–1.0)
Monocytes Relative: 6 %
Myelocytes: 14 %
Neutro Abs: 3.3 10*3/uL (ref 1.7–7.7)
Neutrophils Relative %: 34 %
Platelets: 187 10*3/uL (ref 150–400)
RBC: 4.07 MIL/uL (ref 3.87–5.11)
RDW: 14.7 % (ref 11.5–15.5)
WBC: 5.8 10*3/uL (ref 4.0–10.5)
nRBC: 0 % (ref 0.0–0.2)

## 2021-08-26 LAB — PROTIME-INR
INR: 1.1 (ref 0.8–1.2)
Prothrombin Time: 14.6 seconds (ref 11.4–15.2)

## 2021-08-26 LAB — COMPREHENSIVE METABOLIC PANEL
ALT: 25 U/L (ref 0–44)
AST: 54 U/L — ABNORMAL HIGH (ref 15–41)
Albumin: 4 g/dL (ref 3.5–5.0)
Alkaline Phosphatase: 77 U/L (ref 38–126)
Anion gap: 26 — ABNORMAL HIGH (ref 5–15)
BUN: 37 mg/dL — ABNORMAL HIGH (ref 6–20)
CO2: 30 mmol/L (ref 22–32)
Calcium: 7.3 mg/dL — ABNORMAL LOW (ref 8.9–10.3)
Chloride: 71 mmol/L — ABNORMAL LOW (ref 98–111)
Creatinine, Ser: 1.33 mg/dL — ABNORMAL HIGH (ref 0.44–1.00)
GFR, Estimated: 53 mL/min — ABNORMAL LOW (ref >60)
Glucose, Bld: 206 mg/dL — ABNORMAL HIGH (ref 70–99)
Potassium: 3.4 mmol/L — ABNORMAL LOW (ref 3.5–5.1)
Sodium: 127 mmol/L — ABNORMAL LOW (ref 135–145)
Total Bilirubin: 1.8 mg/dL — ABNORMAL HIGH (ref 0.3–1.2)
Total Protein: 6.9 g/dL (ref 6.5–8.1)

## 2021-08-26 LAB — RESP PANEL BY RT-PCR (FLU A&B, COVID) ARPGX2
Influenza A by PCR: NEGATIVE
Influenza B by PCR: NEGATIVE
SARS Coronavirus 2 by RT PCR: NEGATIVE

## 2021-08-26 LAB — ETHANOL: Alcohol, Ethyl (B): <10 mg/dL (ref <10)

## 2021-08-26 LAB — MAGNESIUM: Magnesium: 2 mg/dL (ref 1.7–2.4)

## 2021-08-26 LAB — PHOSPHORUS: Phosphorus: 1.7 mg/dL — ABNORMAL LOW (ref 2.5–4.6)

## 2021-08-26 MED ORDER — LORAZEPAM 1 MG PO TABS: 0.0000 mg | ORAL_TABLET | Freq: Four times a day (QID) | ORAL | Status: DC

## 2021-08-26 MED ORDER — LACTATED RINGERS IV BOLUS
1000.0000 mL | Freq: Once | INTRAVENOUS | Status: AC
  Administered 2021-08-26: 1000 mL via INTRAVENOUS

## 2021-08-26 MED ORDER — K PHOS MONO-SOD PHOS DI & MONO 155-852-130 MG PO TABS
500.0000 mg | ORAL_TABLET | Freq: Four times a day (QID) | ORAL | Status: DC
  Administered 2021-08-26 (×2): 500 mg via ORAL
  Filled 2021-08-26 (×3): qty 2

## 2021-08-26 MED ORDER — LORAZEPAM 2 MG/ML IJ SOLN: 0.0000 mg | Freq: Three times a day (TID) | INTRAMUSCULAR | Status: DC

## 2021-08-26 MED ORDER — POTASSIUM CHLORIDE 10 MEQ/100ML IV SOLN
10.0000 meq | Freq: Once | INTRAVENOUS | Status: AC
  Administered 2021-08-26: 10 meq via INTRAVENOUS
  Filled 2021-08-26: qty 100

## 2021-08-26 MED ORDER — MAGNESIUM SULFATE 2 GM/50ML IV SOLN
2.0000 g | Freq: Once | INTRAVENOUS | Status: AC
  Administered 2021-08-26: 2 g via INTRAVENOUS
  Filled 2021-08-26: qty 50

## 2021-08-26 MED ORDER — LORAZEPAM 2 MG/ML IJ SOLN: 1.0000 mg | INTRAMUSCULAR | Status: DC | PRN

## 2021-08-26 MED ORDER — LORAZEPAM 1 MG PO TABS: 0.0000 mg | ORAL_TABLET | Freq: Two times a day (BID) | ORAL | Status: DC

## 2021-08-26 MED ORDER — LORAZEPAM 2 MG/ML IJ SOLN: 0.0000 mg | INTRAMUSCULAR | Status: DC

## 2021-08-26 MED ORDER — POTASSIUM CHLORIDE IN NACL 40-0.9 MEQ/L-% IV SOLN
INTRAVENOUS | Status: AC
Start: 2021-08-26 — End: 2021-08-26
  Filled 2021-08-26: qty 1000

## 2021-08-26 MED ORDER — LORAZEPAM 2 MG/ML IJ SOLN
1.0000 mg | Freq: Once | INTRAMUSCULAR | Status: AC
  Administered 2021-08-26: 1 mg via INTRAVENOUS
  Filled 2021-08-26: qty 1

## 2021-08-26 MED ORDER — FOLIC ACID 1 MG PO TABS
1.0000 mg | ORAL_TABLET | Freq: Every day | ORAL | Status: DC
  Administered 2021-08-26 – 2021-08-27 (×2): 1 mg via ORAL
  Filled 2021-08-26 (×2): qty 1

## 2021-08-26 MED ORDER — ORAL CARE MOUTH RINSE
15.0000 mL | Freq: Two times a day (BID) | OROMUCOSAL | Status: DC
  Administered 2021-08-26 – 2021-08-27 (×2): 15 mL via OROMUCOSAL

## 2021-08-26 MED ORDER — PROCHLORPERAZINE EDISYLATE 10 MG/2ML IJ SOLN
10.0000 mg | Freq: Once | INTRAMUSCULAR | Status: AC
  Administered 2021-08-26: 10 mg via INTRAVENOUS
  Filled 2021-08-26: qty 2

## 2021-08-26 MED ORDER — LORAZEPAM 1 MG PO TABS: 1.0000 mg | ORAL_TABLET | ORAL | Status: DC | PRN

## 2021-08-26 MED ORDER — PROCHLORPERAZINE EDISYLATE 10 MG/2ML IJ SOLN
5.0000 mg | INTRAMUSCULAR | Status: DC | PRN
Start: 2021-08-26 — End: 2021-08-28
  Administered 2021-08-27: 5 mg via INTRAVENOUS
  Filled 2021-08-26: qty 2

## 2021-08-26 MED ORDER — SODIUM CHLORIDE 0.9 % IV BOLUS
1000.0000 mL | Freq: Once | INTRAVENOUS | Status: AC
  Administered 2021-08-26: 1000 mL via INTRAVENOUS

## 2021-08-26 MED ORDER — PANTOPRAZOLE SODIUM 40 MG IV SOLR
40.0000 mg | Freq: Once | INTRAVENOUS | Status: AC
  Administered 2021-08-26: 40 mg via INTRAVENOUS
  Filled 2021-08-26: qty 10

## 2021-08-26 MED ORDER — THIAMINE HCL 100 MG/ML IJ SOLN: 100.0000 mg | Freq: Every day | INTRAMUSCULAR | Status: DC

## 2021-08-26 MED ORDER — THIAMINE HCL 100 MG PO TABS
100.0000 mg | ORAL_TABLET | Freq: Every day | ORAL | Status: DC
  Administered 2021-08-26 – 2021-08-27 (×2): 100 mg via ORAL
  Filled 2021-08-26 (×2): qty 1

## 2021-08-26 MED ORDER — ADULT MULTIVITAMIN W/MINERALS CH
1.0000 | ORAL_TABLET | Freq: Every day | ORAL | Status: DC
  Administered 2021-08-26 – 2021-08-27 (×2): 1 via ORAL
  Filled 2021-08-26 (×2): qty 1

## 2021-08-26 MED ORDER — LACTATED RINGERS IV SOLN
INTRAVENOUS | Status: DC
Start: 2021-08-26 — End: 2021-08-27

## 2021-08-26 NOTE — H&P (Signed)
History and Physical    Patient: Sheila Graves [id number] DOB: 03/18/1981 DOA: 08/26/2021 DOS: the patient was seen and examined on 08/26/2021 PCP: Lavinia Sharps, NP  Patient coming from: Home  Chief Complaint:  Chief Complaint  Patient presents with   Alcohol Intoxication   Tachycardia   HPI: Sheila Graves is a 41 y.o. female with medical history significant of alcohol withdrawal, hypokalemia, pancreatitis, hypothyroidism who is coming to the emergency department due to contractures of her hands which happen when she is hypokalemic.  She stated she had been sober for the past 6 months until 4 days ago "when she went on a bender".  She was initially unable to open her hands but at the time of my examination she had experienced significant improvement in all her contractures.  She denied headache, fever, sore throat, rhinorrhea, dyspnea, chest pain, palpitations, diaphoresis, PND, orthopnea or pitting edema of the lower extremities.  She had nausea with dry heaving, but denied abdominal pain, emesis, diarrhea, constipation, melena or hematochezia.  No flank pain, dysuria, frequency hematuria.  No polyuria, polydipsia, polyphagia or blurred vision.  ED course: Initial vital signs were temperature 99.5 F, pulse 130, respirations 16, BP 116/104 mmHg O2 sat 97% on room air.  The patient received 1000 mL of normal saline bolus and 10 mEq of KCl IVPB.  I added normal saline plus KCl 40 mEq 1000 mL bolus over 4 hours, Compazine 10 mg IVP and pantoprazole 40 mg IVP.  Lab work: CBC is her white count 5.8, hemoglobin 11.9 g/dL platelets 327.  Sodium 127, potassium 3.4, chloride 71 and CO2 30 mmol/L.  Her anion gap was 26.  Bilirubin 1.8, glucose 206, BUN 37, creatinine 1.33 and calcium 7.4 mg/dL.  Total protein 6.9 and albumin 4.0 g/dL.  Review of Systems: As mentioned in the history of present illness. All other systems reviewed and are negative. Past Medical History:  Diagnosis Date   Alcohol  withdrawal seizure (HCC)    Alcoholic (HCC)    ETOH abuse    Hypokalemia    Pancreatitis    Thyroid disease    Past Surgical History:  Procedure Laterality Date   FINGER SURGERY     IRRIGATION AND DEBRIDEMENT SHOULDER Right 11/12/2018   Procedure: Irrigation And Debridement Shoulder;  Surgeon: Roby Lofts, MD;  Location: [facility] OR;  Service: Orthopedics;  Laterality: Right;   ORIF CLAVICULAR FRACTURE Right 11/12/2018   Procedure: OPEN REDUCTION INTERNAL FIXATION (ORIF) CLAVICULAR FRACTURE;  Surgeon: Roby Lofts, MD;  Location: [facility] OR;  Service: Orthopedics;  Laterality: Right;   Social History:  reports that she has quit smoking. Her smoking use included cigarettes. She smoked an average of 1 pack per day. She has never used smokeless tobacco. She reports that she does not currently use alcohol. She reports that she does not currently use drugs after having used the following drugs: Heroin.  No Known Allergies  Family History  Problem Relation Age of Onset   Alcohol abuse Other     Prior to Admission medications   Medication Sig Start Date End Date Taking? Authorizing Provider  naltrexone (DEPADE) 50 MG tablet Take 1 tablet (50 mg total) by mouth daily. 02/24/21   Simmons-Robinson, Makiera, MD  escitalopram (LEXAPRO) 10 MG tablet Take 1 tablet (10 mg total) by mouth daily. Patient not taking: Reported on 09/16/2019 03/22/19 11/16/19  Eddie North, MD    Physical Exam: Vitals:   08/26/21 1231 08/26/21 1247 08/26/21 1502 08/26/21 1517  BP: Marland Kitchen)  149/106 (!) 149/106 127/83 121/82  Pulse: 77 77 90 93  Resp: 17  18 16   Temp:    99.3 F (37.4 C)  TempSrc:    Oral  SpO2: 98%  96% 96%  Weight:      Height:       Physical Exam Constitutional:      Appearance: She is normal weight.  HENT:     Head: Normocephalic.     Mouth/Throat:     Mouth: Mucous membranes are dry.  Eyes:     General: No scleral icterus.    Pupils: Pupils are equal, round, and reactive to light.  Neck:      Vascular: No JVD.  Cardiovascular:     Rate and Rhythm: Normal rate and regular rhythm.     Heart sounds: S1 normal and S2 normal.  Pulmonary:     Effort: Pulmonary effort is normal.     Breath sounds: Normal breath sounds.  Abdominal:     General: There is no distension.     Palpations: Abdomen is soft.     Tenderness: There is no abdominal tenderness. There is no guarding.  Musculoskeletal:     Cervical back: Neck supple.     Right lower leg: No edema.     Left lower leg: No edema.  Skin:    General: Skin is warm and dry.  Neurological:     General: No focal deficit present.     Mental Status: She is alert and oriented to person, place, and time.     Comments: Somnolent. Arousable.  Psychiatric:        Mood and Affect: Mood normal.   EKG: Vent. rate 133 BPM PR interval 118 ms QRS duration 74 ms QT/QTcB 322/479 ms P-R-T axes 91 85 80 Sinus tachycardia Cannot rule out Anterior infarct , age undetermined Abnormal ECG  Data Reviewed:  There are no new results to review at this time.  Assessment and Plan: Principal problem:   AKI (acute kidney injury) ([place]) Secondary to EtOH use/GI losses Observation/PCU. Continue IV fluids. Avoid hypotension and nephrotoxins. Monitor intake and output. Monitor and correct electrolytes. Follow-up renal function in the morning.  Active Problems:   Hypokalemia Replacing. Magnesium was supplemented. Follow-up potassium level.    Hypophosphatemia Replacing. Follow-up phosphorus level as needed.    Alcohol use disorder, severe, dependence ([place]) CIWA protocol with lorazepam as needed. Folate, MVI and thiamine. Consult TOC team.    Advance Care Planning:   Code Status: Full Code   Consults:   Family Communication:   Severity of Illness: The appropriate patient status for this patient is OBSERVATION. Observation status is judged to be reasonable and necessary in order to provide the required intensity of service to  ensure the patient's safety. The patient's presenting symptoms, physical exam findings, and initial radiographic and laboratory data in the context of their medical condition is felt to place them at decreased risk for further clinical deterioration. Furthermore, it is anticipated that the patient will be medically stable for discharge from the hospital within 2 midnights of admission.   Author: Reubin Milan, MD 08/26/2021 3:51 PM  For on call review [email or web address].   This document was prepared using [employer] and may contain some unintended transcription errors.

## 2021-08-26 NOTE — ED Notes (Signed)
Pt given diet coke at this time.  Pt's hand continue to be contracted into fists.

## 2021-08-26 NOTE — ED Provider Notes (Signed)
[facility] DEPT Provider Note   CSN: [id number] Arrival date & time: 08/26/21  0845     History  Chief Complaint  Patient presents with   Alcohol Intoxication   Tachycardia    Sheila Graves is a 41 y.o. female.  Patient has history of significant alcohol abuse.  She states that she was vomiting all day yesterday and has not had alcohol today.  She states she cannot open her hands that they are closed tightly.  This happened to her once before when her potassium was very low.  Patient has no other complaints  The history is provided by the patient and medical records. No language interpreter was used.  Extremity Weakness This is a recurrent problem. The current episode started 6 to 12 hours ago. The problem occurs constantly. The problem has not changed since onset.Pertinent negatives include no chest pain, no abdominal pain and no headaches. Nothing aggravates the symptoms. Nothing relieves the symptoms. She has tried nothing for the symptoms.      Home Medications Prior to Admission medications   Medication Sig Start Date End Date Taking? Authorizing Provider  naltrexone (DEPADE) 50 MG tablet Take 1 tablet (50 mg total) by mouth daily. 02/24/21   Simmons-Robinson, Makiera, MD  escitalopram (LEXAPRO) 10 MG tablet Take 1 tablet (10 mg total) by mouth daily. Patient not taking: Reported on 09/16/2019 03/22/19 11/16/19  Louellen Molder, MD      Allergies    Patient has no known allergies.    Review of Systems   Review of Systems  Constitutional:  Negative for appetite change and fatigue.  HENT:  Negative for congestion, ear discharge and sinus pressure.   Eyes:  Negative for discharge.  Respiratory:  Negative for cough.   Cardiovascular:  Negative for chest pain.  Gastrointestinal:  Negative for abdominal pain and diarrhea.  Genitourinary:  Negative for frequency and hematuria.  Musculoskeletal:  Positive for extremity weakness. Negative for back  pain.       Hands clenched shut  Skin:  Negative for rash.  Neurological:  Negative for seizures and headaches.  Psychiatric/Behavioral:  Negative for hallucinations.    Physical Exam Updated Vital Signs BP (!) 149/106    Pulse 77    Temp 99.5 F (37.5 C) (Oral)    Resp 17    Ht 5\' 10"  (1.778 m)    Wt 63.5 kg    SpO2 98%    BMI 20.09 kg/m  Physical Exam Vitals and nursing note reviewed.  Constitutional:      Appearance: She is well-developed.  HENT:     Head: Normocephalic.     Nose: Nose normal.  Eyes:     General: No scleral icterus.    Conjunctiva/sclera: Conjunctivae normal.  Neck:     Thyroid: No thyromegaly.  Cardiovascular:     Rate and Rhythm: Normal rate and regular rhythm.     Heart sounds: No murmur heard.   No friction rub. No gallop.  Pulmonary:     Breath sounds: No stridor. No wheezing or rales.  Chest:     Chest wall: No tenderness.  Abdominal:     General: There is no distension.     Tenderness: There is no abdominal tenderness. There is no rebound.  Musculoskeletal:     Cervical back: Neck supple.     Comments: Hands clenched shut and we cannot open them  Lymphadenopathy:     Cervical: No cervical adenopathy.  Skin:    Findings: No  erythema or rash.  Neurological:     Mental Status: She is alert and oriented to person, place, and time.     Motor: No abnormal muscle tone.     Coordination: Coordination normal.  Psychiatric:        Behavior: Behavior normal.    ED Results / Procedures / Treatments   Labs (all labs ordered are listed, but only abnormal results are displayed) Labs Reviewed  CBC WITH DIFFERENTIAL/PLATELET - Abnormal; Notable for the following components:      Result Value   Hemoglobin 11.9 (*)    HCT 33.5 (*)    Abs Immature Granulocytes 1.30 (*)    All other components within normal limits  COMPREHENSIVE METABOLIC PANEL - Abnormal; Notable for the following components:   Sodium 127 (*)    Potassium 3.4 (*)    Chloride 71 (*)     Glucose, Bld 206 (*)    BUN 37 (*)    Creatinine, Ser 1.33 (*)    Calcium 7.3 (*)    AST 54 (*)    Total Bilirubin 1.8 (*)    GFR, Estimated 53 (*)    Anion gap 26 (*)    All other components within normal limits  PHOSPHORUS - Abnormal; Notable for the following components:   Phosphorus 1.7 (*)    All other components within normal limits  RESP PANEL BY RT-PCR (FLU A&B, COVID) ARPGX2  ETHANOL  MAGNESIUM  PROTIME-INR    EKG None  Radiology No results found.  Procedures Procedures    Medications Ordered in ED Medications  pantoprazole (PROTONIX) injection 40 mg (has no administration in time range)  0.9 % NaCl with KCl 40 mEq / L  infusion (has no administration in time range)  magnesium sulfate IVPB 2 g 50 mL (has no administration in time range)  prochlorperazine (COMPAZINE) injection 5 mg (has no administration in time range)  prochlorperazine (COMPAZINE) injection 10 mg (has no administration in time range)  LORazepam (ATIVAN) tablet 1-4 mg (has no administration in time range)    Or  LORazepam (ATIVAN) injection 1-4 mg (has no administration in time range)  thiamine tablet 100 mg (has no administration in time range)    Or  thiamine (B-1) injection 100 mg (has no administration in time range)  folic acid (FOLVITE) tablet 1 mg (has no administration in time range)  multivitamin with minerals tablet 1 tablet (has no administration in time range)  LORazepam (ATIVAN) injection 0-4 mg (has no administration in time range)    Followed by  LORazepam (ATIVAN) injection 0-4 mg (has no administration in time range)  sodium chloride 0.9 % bolus 1,000 mL (0 mLs Intravenous Stopped 08/26/21 1200)  LORazepam (ATIVAN) injection 1 mg (1 mg Intravenous Given 08/26/21 0948)  potassium chloride 10 mEq in 100 mL IVPB (0 mEq Intravenous Stopped 08/26/21 1140)  LORazepam (ATIVAN) injection 1 mg (1 mg Intravenous Given 08/26/21 1158)    ED Course/ Medical Decision Making/ A&P                            Medical Decision Making Amount and/or Complexity of Data Reviewed Labs: ordered.  Risk Prescription drug management. Decision regarding hospitalization.   Patient with a history of EtOH abuse.  Her electrolytes show hyponatremia hypokalemia and her chloride is very low she has an AKI.  I suspect the muscle spasm she is having is relating to her electrolyte abnormality.  She will be  admitted to medicine            This patient presents to the ED for concern of closed hand, this involves an extensive number of treatment options, and is a complaint that carries with it a high risk of complications and morbidity.  The differential diagnosis includes electrolyte abnormality   Co morbidities that complicate the patient evaluation  EtOH abuse   Additional history obtained:  Additional history obtained from patient External records from outside source obtained and reviewed including hospital records   Lab Tests:  I Ordered, and personally interpreted labs.  The pertinent results include: CBC and chemistries which showed hyponatremia along with significant decreased chloride.  Patient also has an anion gap of 27   Imaging Studies ordered:  I ordered imaging studies including no imaging I independently visualized and interpreted imaging which showed no imaging I agree with the radiologist interpretation   Cardiac Monitoring:  The patient was maintained on a cardiac monitor.  I personally viewed and interpreted the cardiac monitored which showed an underlying rhythm of: Normal sinus rhythm   Medicines ordered and prescription drug management:  I ordered medication including Ativan for congestion and Reevaluation of the patient after these medicines showed that the patient stayed the same I have reviewed the patients home medicines and have made adjustments as needed   Test Considered: None   Critical Interventions:  Ativan  IV   Consultations Obtained:  I requested consultation with the hospitalist,  and discussed lab and imaging findings as well as pertinent plan - they recommend: Admission   Problem List / ED Course:  Electrolyte abnormality    Social Determinants of Health:  EtOH abuse  Patient will be admitted to the hospitalist  Final Clinical Impression(s) / ED Diagnoses Final diagnoses:  Hypokalemia  Alcohol abuse    Rx / DC Orders ED Discharge Orders     None         Milton Ferguson, MD 08/28/21 [phone]

## 2021-08-26 NOTE — ED Triage Notes (Signed)
Pt bib ems for etoh and per pt "low potassium".  Pt states when her hands get contracted like this her potassium is low.  Pt's hands appear to be contracted into fists.  Pt states she was 6 months sober prior to 4 days ago when she went on a bender.  Pt given NS and 4mg  Zofran prior to arrival by ems.  Pt dry heaving upon arrival.

## 2021-08-27 DIAGNOSIS — E876 Hypokalemia: Secondary | ICD-10-CM

## 2021-08-27 DIAGNOSIS — E871 Hypo-osmolality and hyponatremia: Secondary | ICD-10-CM

## 2021-08-27 DIAGNOSIS — F1020 Alcohol dependence, uncomplicated: Secondary | ICD-10-CM

## 2021-08-27 LAB — CBC
HCT: 30 % — ABNORMAL LOW (ref 36.0–46.0)
Hemoglobin: 10.1 g/dL — ABNORMAL LOW (ref 12.0–15.0)
MCH: 29.6 pg (ref 26.0–34.0)
MCHC: 33.7 g/dL (ref 30.0–36.0)
MCV: 88 fL (ref 80.0–100.0)
Platelets: 138 10*3/uL — ABNORMAL LOW (ref 150–400)
RBC: 3.41 MIL/uL — ABNORMAL LOW (ref 3.87–5.11)
RDW: 14.9 % (ref 11.5–15.5)
WBC: 5.1 10*3/uL (ref 4.0–10.5)
nRBC: 0 % (ref 0.0–0.2)

## 2021-08-27 LAB — C DIFFICILE QUICK SCREEN W PCR REFLEX
C Diff antigen: NEGATIVE
C Diff interpretation: NOT DETECTED
C Diff toxin: NEGATIVE

## 2021-08-27 LAB — COMPREHENSIVE METABOLIC PANEL
ALT: 27 U/L (ref 0–44)
AST: 82 U/L — ABNORMAL HIGH (ref 15–41)
Albumin: 3.4 g/dL — ABNORMAL LOW (ref 3.5–5.0)
Alkaline Phosphatase: 63 U/L (ref 38–126)
Anion gap: 8 (ref 5–15)
BUN: 11 mg/dL (ref 6–20)
CO2: 33 mmol/L — ABNORMAL HIGH (ref 22–32)
Calcium: 7.6 mg/dL — ABNORMAL LOW (ref 8.9–10.3)
Chloride: 88 mmol/L — ABNORMAL LOW (ref 98–111)
Creatinine, Ser: 0.74 mg/dL (ref 0.44–1.00)
GFR, Estimated: >60 mL/min (ref >60)
Glucose, Bld: 105 mg/dL — ABNORMAL HIGH (ref 70–99)
Potassium: 2.7 mmol/L — CL (ref 3.5–5.1)
Sodium: 129 mmol/L — ABNORMAL LOW (ref 135–145)
Total Bilirubin: 1 mg/dL (ref 0.3–1.2)
Total Protein: 5.8 g/dL — ABNORMAL LOW (ref 6.5–8.1)

## 2021-08-27 MED ORDER — POTASSIUM CHLORIDE CRYS ER 20 MEQ PO TBCR
40.0000 meq | EXTENDED_RELEASE_TABLET | Freq: Two times a day (BID) | ORAL | Status: DC
  Administered 2021-08-27: 40 meq via ORAL
  Filled 2021-08-27: qty 2

## 2021-08-27 MED ORDER — POTASSIUM CHLORIDE CRYS ER 20 MEQ PO TBCR
40.0000 meq | EXTENDED_RELEASE_TABLET | ORAL | Status: AC
  Administered 2021-08-27 (×2): 40 meq via ORAL
  Filled 2021-08-27 (×2): qty 2

## 2021-08-27 MED ORDER — TRAZODONE HCL 50 MG PO TABS
50.0000 mg | ORAL_TABLET | Freq: Every evening | ORAL | Status: DC | PRN
  Administered 2021-08-27: 50 mg via ORAL
  Filled 2021-08-27: qty 1

## 2021-08-27 MED ORDER — DEXTROSE 5 % IV SOLN
15.0000 mmol | Freq: Once | INTRAVENOUS | Status: AC
  Administered 2021-08-27: 15 mmol via INTRAVENOUS
  Filled 2021-08-27: qty 5

## 2021-08-27 MED ORDER — SODIUM CHLORIDE 0.9 % IV SOLN
INTRAVENOUS | Status: DC
Start: 2021-08-27 — End: 2021-08-28

## 2021-08-27 NOTE — Progress Notes (Signed)
[facility] ? ?PROGRESS NOTE ? ?Sheila Graves [id number]:[id number] DOB: 02/04/1981 DOA: 08/26/2021 ?PCP: Lavinia Sharps, NP ? ? ?Brief HPI:   ?41 year old female with a history of alcohol abuse,  alcohol withdrawal, hypokalemia, pancreatitis, hypothyroidism presented with contractions of her hands, which usually happen when she is hypokalemic.  Patient states that she has been sober for past 6 months until 4 days ago when she went on a bender.  She was unable to open her hands.  In the ED potassium was 3.4, creatinine 1.33. ? ? ?Subjective  ? ?This morning patient serum potassium has gone down to 2.9.  She has developed diarrhea.  Also has low phosphate. ? ? Assessment/Plan:  ? ? ?Acute kidney injury ?-Resolved ?-Secondary to alcohol use/GI losses ?-Started on IV fluids ?-Today creatinine is 0.74 ? ?Diarrhea ?-Unclear etiology ?-Patient had 4 loose BM this morning ?-We will order stool for C. difficile. ? ?Hypokalemia ?-Potassium was 2.9 this morning ?-We will replace potassium with K-Dur 40 mEq p.o. x4 doses ?-Follow serum potassium in a.m. ? ?Hypophosphatemia ?-Replace phosphorus ? ?Alcohol use disorder/dependence ?-Patient started on CIWA protocol with lorazepam as needed ?-Continue folate, MVI, thiamine ? ?Hyponatremia ?-Sodium is 129 ?-Likely from GI losses ?-Follow serum sodium in a.m. ? ? ?Medications ? ?  ? folic acid  1 mg Oral Daily  ? LORazepam  0-4 mg Oral Q6H  ? Followed by  ? [START ON 08/28/2021] LORazepam  0-4 mg Oral Q12H  ? mouth rinse  15 mL Mouth Rinse BID  ? multivitamin with minerals  1 tablet Oral Daily  ? thiamine  100 mg Oral Daily  ? Or  ? thiamine  100 mg Intravenous Daily  ? ? ? Data Reviewed:  ? ?CBG: ? ?No results for input(s): GLUCAP in the last 168 hours. ? ?SpO2: 99 %  ? ? ?Vitals:  ? 08/27/21 0025 08/27/21 1004 08/27/21 1307 08/27/21 1752  ?BP: 125/78 110/77 116/87 110/81  ?Pulse: 76 81 77 73  ?Resp: 20 18  20   ?Temp: 98.2 ?F (36.8 ?C) 99 ?F (37.2 ?C)  98.6 ?F (37 ?C)  ?TempSrc:  Oral   Axillary  ?SpO2: 97% 99%  99%  ?Weight:      ?Height:      ? ? ? ? ?Data Reviewed: ? ?Basic Metabolic Panel: ?Recent Labs  ?Lab 08/26/21 ?0907 08/27/21 ?0448  ?NA 127* 129*  ?K 3.4* 2.7*  ?CL 71* 88*  ?CO2 30 33*  ?GLUCOSE 206* 105*  ?BUN 37* 11  ?CREATININE 1.33* 0.74  ?CALCIUM 7.3* 7.6*  ?MG 2.0  --   ?PHOS 1.7*  --   ? ? ?CBC: ?Recent Labs  ?Lab 08/26/21 ?0907 08/27/21 ?0448  ?WBC 5.8 5.1  ?NEUTROABS 3.3  --   ?HGB 11.9* 10.1*  ?HCT 33.5* 30.0*  ?MCV 82.3 88.0  ?PLT 187 138*  ? ? ?LFT ?Recent Labs  ?Lab 08/26/21 ?0907 08/27/21 ?0448  ?AST 54* 82*  ?ALT 25 27  ?ALKPHOS 77 63  ?BILITOT 1.8* 1.0  ?PROT 6.9 5.8*  ?ALBUMIN 4.0 3.4*  ? ?  ?Antibiotics: ?Anti-infectives (From admission, onward)  ? ? None  ? ?  ? ? ? ?DVT prophylaxis: SCDs ? ?Code Status: Full code ? ?Family Communication: No family at bedside ? ? ?CONSULTS  ? ? ?Objective  ? ? ?Physical Examination: ? ? ?General-appears in no acute distress ?Heart-S1-S2, regular, no murmur auscultated ?Lungs-clear to auscultation bilaterally, no wheezing or crackles auscultated ?Abdomen-soft, nontender, no organomegaly ?Extremities-no edema in the  lower extremities ?Neuro-alert, oriented x3, no focal deficit noted ? ?Status is: Inpatient: Diarrhea, hypokalemia ? ? ? ?  ?Meredeth Ide ?  ?[facility] ?If 7PM-7AM, please contact night-coverage at [email or web address], ?Office  [phone] ? ? ?08/27/2021, 7:38 PM  LOS: 0 days  ? ? ? ? ? ? ? ? ? ? ?  ?

## 2021-08-28 LAB — BASIC METABOLIC PANEL
Anion gap: 5 (ref 5–15)
BUN: 9 mg/dL (ref 6–20)
CO2: 29 mmol/L (ref 22–32)
Calcium: 8.4 mg/dL — ABNORMAL LOW (ref 8.9–10.3)
Chloride: 101 mmol/L (ref 98–111)
Creatinine, Ser: 0.58 mg/dL (ref 0.44–1.00)
GFR, Estimated: >60 mL/min (ref >60)
Glucose, Bld: 108 mg/dL — ABNORMAL HIGH (ref 70–99)
Potassium: 3.7 mmol/L (ref 3.5–5.1)
Sodium: 135 mmol/L (ref 135–145)

## 2021-08-28 LAB — PHOSPHORUS: Phosphorus: 1.3 mg/dL — ABNORMAL LOW (ref 2.5–4.6)

## 2021-08-28 LAB — MAGNESIUM: Magnesium: 2 mg/dL (ref 1.7–2.4)

## 2021-08-28 MED ORDER — K PHOS MONO-SOD PHOS DI & MONO 155-852-130 MG PO TABS: 500.0000 mg | ORAL_TABLET | Freq: Three times a day (TID) | ORAL | 0 refills | Status: AC

## 2021-08-28 MED ORDER — FOLIC ACID 1 MG PO TABS: 1.0000 mg | ORAL_TABLET | Freq: Every day | ORAL | 0 refills | Status: DC

## 2021-08-28 MED ORDER — K PHOS MONO-SOD PHOS DI & MONO 155-852-130 MG PO TABS
500.0000 mg | ORAL_TABLET | Freq: Three times a day (TID) | ORAL | Status: DC
  Filled 2021-08-28 (×2): qty 2

## 2021-08-28 MED ORDER — THIAMINE HCL 100 MG PO TABS: 100.0000 mg | ORAL_TABLET | Freq: Every day | ORAL | 1 refills | Status: DC

## 2021-08-28 NOTE — Discharge Summary (Signed)
?Physician Discharge Summary ?  ?Patient: Sheila Graves MRN: [id number] DOB: 05/28/1981  ?Admit date:     08/26/2021  ?Discharge date: 08/28/21  ?Discharge Physician: Meredeth Ide  ? ?PCP: Lavinia Sharps, NP  ? ?Recommendations at discharge:  ? ?Follow-up PCP in 1 week ?Check phosphorus level in 1 week ? ?Discharge Diagnoses: ?Principal Problem: ?  AKI (acute kidney injury) (HCC) ?Active Problems: ?  Hypokalemia ?  Alcohol use disorder, severe, dependence (HCC) ?  Hypophosphatemia ?  Hyponatremia ? ?Resolved Problems: ?  * No resolved hospital problems. * ? ? ?Brief HPI ? ?41 year old female with a history of alcohol abuse,  alcohol withdrawal, hypokalemia, pancreatitis, hypothyroidism presented with contractions of her hands, which usually happen when she is hypokalemic.  Patient states that she has been sober for past 6 months until 4 days ago when she went on a bender.  She was unable to open her hands.  In the ED potassium was 3.4, creatinine 1.33. ? ?Hospital course ? ?Acute kidney injury ?-Resolved ?-Secondary to alcohol use/GI losses ?-Started on IV fluids ?-Today creatinine is 0.58 ?  ?Diarrhea ?-Unclear etiology ?-Patient had 4 loose BM this morning ?-Stool for C. difficile was negative ?  ?Hypokalemia ?-Replete ? ?Hypophosphatemia ?-Phosphorus is 1.3 ?-Patient does not want to stay in the hospital to replace phosphorus and is threatening to leave AMA ?-We will discharge on Neutra-Phos-K 500 mg p.o. 3 times daily for 3 days ?-Need to follow-up with PCP in 1 week to check serum phosphorus level ? ?Alcohol use disorder/dependence ?-Patient started on CIWA protocol with lorazepam as needed ?-No signs or symptoms of alcohol withdrawal ?-Continue folate, thiamine ?  ?Hyponatremia ?-Resolved ?-Likely from GI losses ?  ? ? ?  ? ? ? ? ?Consultants:  ?Procedures performed:  ?Disposition: Home ?Diet recommendation:  ?Discharge Diet Orders (From admission, onward)  ? ?  Start     Ordered  ? 08/28/21 0000  Diet - low  sodium heart healthy       ? 08/28/21 0926  ? ?  ?  ? ?  ? ?Regular diet ? ?DISCHARGE MEDICATION: ?Allergies as of 08/28/2021   ?No Known Allergies ?  ? ?  ?Medication List  ?  ? ?TAKE these medications   ? ?aspirin-acetaminophen-caffeine 250-250-65 MG tablet ?Commonly known as: EXCEDRIN MIGRAINE ?Take 2 tablets by mouth every 6 (six) hours as needed for headache or migraine. ?  ?folic acid 1 MG tablet ?Commonly known as: FOLVITE ?Take 1 tablet (1 mg total) by mouth daily. ?  ?multivitamin capsule ?Take 1 capsule by mouth daily. ?  ?naltrexone 50 MG tablet ?Commonly known as: DEPADE ?Take 1 tablet (50 mg total) by mouth daily. ?  ?phosphorus 155-852-130 MG tablet ?Commonly known as: K PHOS NEUTRAL ?Take 2 tablets (500 mg total) by mouth 3 (three) times daily for 3 days. ?  ?thiamine 100 MG tablet ?Take 1 tablet (100 mg total) by mouth daily. ?  ? ?  ? ? Follow-up Information   ? ? Placey, Chales Abrahams, NP Follow up in 1 week(s).   ?Why: Check phosphorus in 1 week ?Contact information: ?[address] ?[place] [address] ?[phone] ? ? ?  ?  ? ?  ?  ? ?  ? ? ?Discharge Exam: ?Filed Weights  ? 08/26/21 0855  ?Weight: 63.5 kg  ? ?General-appears in no acute distress ?Heart-S1-S2, regular, no murmur auscultated ?Lungs-clear to auscultation bilaterally, no wheezing or crackles auscultated ?Abdomen-soft, nontender, no organomegaly ?Extremities-no edema  in the lower extremities ?Neuro-alert, oriented x3, no focal deficit noted ? ?Condition at discharge: good ? ?The results of significant diagnostics from this hospitalization (including imaging, microbiology, ancillary and laboratory) are listed below for reference.  ? ?Imaging Studies: ?No results found. ? ?Microbiology: ?Results for orders placed or performed during the hospital encounter of 08/26/21  ?Resp Panel by RT-PCR (Flu A&B, Covid) Nasopharyngeal Swab     Status: None  ? Collection Time: 08/26/21 12:38 PM  ? Specimen: Nasopharyngeal Swab; Nasopharyngeal(NP)  swabs in vial transport medium  ?Result Value Ref Range Status  ? SARS Coronavirus 2 by RT PCR NEGATIVE NEGATIVE Final  ?  Comment: (NOTE) ?SARS-CoV-2 target nucleic acids are NOT DETECTED. ? ?The SARS-CoV-2 RNA is generally detectable in upper respiratory ?specimens during the acute phase of infection. The lowest ?concentration of SARS-CoV-2 viral copies this assay can detect is ?138 copies/mL. A negative result does not preclude SARS-Cov-2 ?infection and should not be used as the sole basis for treatment or ?other patient management decisions. A negative result may occur with  ?improper specimen collection/handling, submission of specimen other ?than nasopharyngeal swab, presence of viral mutation(s) within the ?areas targeted by this assay, and inadequate number of viral ?copies(<138 copies/mL). A negative result must be combined with ?clinical observations, patient history, and epidemiological ?information. The expected result is Negative. ? ?Fact Sheet for Patients:  ?BloggerCourse.com ? ?Fact Sheet for Healthcare Providers:  ?SeriousBroker.it ? ?This test is no t yet approved or cleared by the [place] FDA and  ?has been authorized for detection and/or diagnosis of SARS-CoV-2 by ?FDA under an Emergency Use Authorization (EUA). This EUA will remain  ?in effect (meaning this test can be used) for the duration of the ?COVID-19 declaration under Section 564(b)(1) of the Act, 21 ?U.S.C.section 360bbb-3(b)(1), unless the authorization is terminated  ?or revoked sooner.  ? ? ?  ? Influenza A by PCR NEGATIVE NEGATIVE Final  ? Influenza B by PCR NEGATIVE NEGATIVE Final  ?  Comment: (NOTE) ?The Xpert Xpress SARS-CoV-2/FLU/RSV plus assay is intended as an aid ?in the diagnosis of influenza from Nasopharyngeal swab specimens and ?should not be used as a sole basis for treatment. Nasal washings and ?aspirates are unacceptable for Xpert Xpress  SARS-CoV-2/FLU/RSV ?testing. ? ?Fact Sheet for Patients: ?BloggerCourse.com ? ?Fact Sheet for Healthcare Providers: ?SeriousBroker.it ? ?This test is not yet approved or cleared by the [place] FDA and ?has been authorized for detection and/or diagnosis of SARS-CoV-2 by ?FDA under an Emergency Use Authorization (EUA). This EUA will remain ?in effect (meaning this test can be used) for the duration of the ?COVID-19 declaration under Section 564(b)(1) of the Act, 21 U.S.C. ?section 360bbb-3(b)(1), unless the authorization is terminated or ?revoked. ? ?Performed at [facility], [address] ?[place], [place] [address] ?  ?C Difficile Quick Screen w PCR reflex     Status: None  ? Collection Time: 08/27/21  7:00 PM  ? Specimen: STOOL  ?Result Value Ref Range Status  ? C Diff antigen NEGATIVE NEGATIVE Final  ? C Diff toxin NEGATIVE NEGATIVE Final  ? C Diff interpretation No C. difficile detected.  Final  ?  Comment: Performed at [facility], [address], [place], [place] [address]  ? ? ?Labs: ?CBC: ?Recent Labs  ?Lab 08/26/21 ?0907 08/27/21 ?0448  ?WBC 5.8 5.1  ?NEUTROABS 3.3  --   ?HGB 11.9* 10.1*  ?HCT 33.5* 30.0*  ?MCV 82.3 88.0  ?PLT 187 138*  ? ?  Basic Metabolic Panel: ?Recent Labs  ?Lab 08/26/21 ?0907 08/27/21 ?0448 08/28/21 ?0453  ?NA 127* 129* 135  ?K 3.4* 2.7* 3.7  ?CL 71* 88* 101  ?CO2 30 33* 29  ?GLUCOSE 206* 105* 108*  ?BUN 37* 11 9  ?CREATININE 1.33* 0.74 0.58  ?CALCIUM 7.3* 7.6* 8.4*  ?MG 2.0  --  2.0  ?PHOS 1.7*  --  1.3*  ? ?Liver Function Tests: ?Recent Labs  ?Lab 08/26/21 ?0907 08/27/21 ?0448  ?AST 54* 82*  ?ALT 25 27  ?ALKPHOS 77 63  ?BILITOT 1.8* 1.0  ?PROT 6.9 5.8*  ?ALBUMIN 4.0 3.4*  ? ?CBG: ?No results for input(s): GLUCAP in the last 168 hours. ? ?Discharge time spent: greater than 30 minutes. ? ?Signed: ?Meredeth Ide, MD ?[facility] ?08/28/2021 ? ? ? ? ? ? ? ? ?

## 2021-08-28 NOTE — TOC Initial Note (Signed)
Transition of Care (TOC) - Initial/Assessment Note  ? ? ?Patient Details  ?Name: Sheila Graves ?MRN: [id number] ?Date of Birth: [birth year] ? ?Transition of Care [facility]) CM/SW Contact:    ?Golda Acre, RN ?Phone Number: ?08/28/2021, 8:43 AM ? ?Clinical Narrative:                 ? ?Transition of Care (TOC) Screening Note ? ? ?Patient Details  ?Name: Sheila Graves ?Date of Birth: 1981/05/28 ? ? ?Transition of Care [facility]) CM/SW Contact:    ?Golda Acre, RN ?Phone Number: ?08/28/2021, 8:43 AM ? ? ? ?Transition of Care Department [facility]) has reviewed patient and no TOC needs have been identified at this time. We will continue to monitor patient advancement through interdisciplinary progression rounds. If new patient transition needs arise, please place a TOC consult. ? ? ? ?Expected Discharge Plan: Home/Self Care ?Barriers to Discharge: Continued Medical Work up ? ? ?Patient Goals and CMS Choice ?Patient states their goals for this hospitalization and ongoing recovery are:: to go home ?CMS Medicare.gov Compare Post Acute Care list provided to:: Patient ?Choice offered to / list presented to : Patient ? ?Expected Discharge Plan and Services ?Expected Discharge Plan: Home/Self Care ?  ?Discharge Planning Services: CM Consult ?  ?Living arrangements for the past 2 months: Apartment ?                ?  ?  ?  ?  ?  ?  ?  ?  ?  ?  ? ?Prior Living Arrangements/Services ?Living arrangements for the past 2 months: Apartment ?Lives with:: Spouse ?Patient language and need for interpreter reviewed:: Yes ?Do you feel safe going back to the place where you live?: Yes      ?Need for Family Participation in Patient Care: Yes (Comment) ?  ?  ?Criminal Activity/Legal Involvement Pertinent to Current Situation/Hospitalization: No - Comment as needed ? ?Activities of Daily Living ?Home Assistive Devices/Equipment: None ?ADL Screening (condition at time of admission) ?Patient's cognitive ability adequate to safely complete daily activities?:  Yes ?Is the patient deaf or have difficulty hearing?: No ?Does the patient have difficulty seeing, even when wearing glasses/contacts?: No ?Does the patient have difficulty concentrating, remembering, or making decisions?: No ?Patient able to express need for assistance with ADLs?: Yes ?Does the patient have difficulty dressing or bathing?: No ?Independently performs ADLs?: Yes (appropriate for developmental age) ?Does the patient have difficulty walking or climbing stairs?: No ?Weakness of Legs: None ?Weakness of Arms/Hands: None ? ?Permission Sought/Granted ?  ?  ?   ?   ?   ?   ? ?Emotional Assessment ?Appearance:: Appears stated age ?  ?  ?Orientation: : Oriented to Self, Oriented to Place, Oriented to  Time, Oriented to Situation ?Alcohol / Substance Use: Alcohol Use ?Psych Involvement: No (comment) ? ?Admission diagnosis:  Hypokalemia [E87.6] ?Alcohol abuse [F10.10] ?AKI (acute kidney injury) (HCC) [N17.9] ?Patient Active Problem List  ? Diagnosis Date Noted  ? AKI (acute kidney injury) (HCC) 08/26/2021  ? Hyponatremia 08/26/2021  ? Alcohol intoxication (HCC) 02/22/2021  ? Acute encephalopathy 02/22/2021  ? Hypophosphatemia 07/31/2019  ? Alcohol use disorder, severe, dependence (HCC)   ? Prolonged QT interval 03/16/2019  ? Hypokalemia 02/04/2019  ? Major depressive disorder, recurrent, severe without psychotic features (HCC) 01/31/2018  ? ?PCP:  Lavinia Sharps, NP ?Pharmacy:   ?[facility] ?[address] ?[place] [address] ?Phone: [phone] Fax: [phone] ? ?[facility] [phone] -  [place] - [address] AT [facility] [address] [facility] [address] ?[address] ?[place] [address] ?Phone: [phone] Fax: [phone] ? ? ? ? ?Social Determinants of Health (SDOH) Interventions ?  ? ?Readmission Risk Interventions ?No flowsheet data found. ? ? ?

## 2021-08-28 NOTE — TOC Transition Note (Signed)
Transition of Care (TOC) - CM/SW Discharge Note ? ? ?Patient Details  ?Name: Sheila Graves ?MRN: [id number] ?Date of Birth: 02/20/1981 ? ?Transition of Care [facility]) CM/SW Contact:  ?Golda Acre, RN ?Phone Number: ?08/28/2021, 9:39 AM ? ? ?Clinical Narrative:    ?Dcd to home with no toc needs present. ? ? ?  ?Barriers to Discharge: Continued Medical Work up ? ? ?Patient Goals and CMS Choice ?Patient states their goals for this hospitalization and ongoing recovery are:: to go home ?CMS Medicare.gov Compare Post Acute Care list provided to:: Patient ?Choice offered to / list presented to : Patient ? ?Discharge Placement ?  ?           ?  ?  ?  ?  ? ?Discharge Plan and Services ?  ?Discharge Planning Services: CM Consult ?           ?  ?  ?  ?  ?  ?  ?  ?  ?  ?  ? ?Social Determinants of Health (SDOH) Interventions ?  ? ? ?Readmission Risk Interventions ?No flowsheet data found. ? ? ? ? ?

## 2021-08-28 NOTE — Progress Notes (Signed)
Upon entering room with discharge instructions, patient was not present. Patient left before receiving discharge papers; medications have been sent to pharmacy per MD. No IV's to be removed. ? ? Layla Maw, RN ?

## 2021-09-06 ENCOUNTER — Other Ambulatory Visit: Payer: Self-pay

## 2021-09-06 ENCOUNTER — Encounter (HOSPITAL_COMMUNITY): Payer: Self-pay

## 2021-09-06 ENCOUNTER — Emergency Department (HOSPITAL_COMMUNITY)
Admission: EM | Admit: 2021-09-06 | Discharge: 2021-09-06 | Disposition: A | Discharge status: Home / Self Care | Payer: Self-pay | Attending: Emergency Medicine | Admitting: Emergency Medicine

## 2021-09-06 DIAGNOSIS — E876 Hypokalemia: Secondary | ICD-10-CM | POA: Insufficient documentation

## 2021-09-06 DIAGNOSIS — Y908 Blood alcohol level of 240 mg/100 ml or more: Secondary | ICD-10-CM | POA: Insufficient documentation

## 2021-09-06 DIAGNOSIS — F10920 Alcohol use, unspecified with intoxication, uncomplicated: Secondary | ICD-10-CM | POA: Insufficient documentation

## 2021-09-06 LAB — CBC WITH DIFFERENTIAL/PLATELET
Abs Immature Granulocytes: 0.1 10*3/uL — ABNORMAL HIGH (ref 0.00–0.07)
Basophils Absolute: 0.1 10*3/uL (ref 0.0–0.1)
Basophils Relative: 0 %
Eosinophils Absolute: 0 10*3/uL (ref 0.0–0.5)
Eosinophils Relative: 0 %
HCT: 48.1 % — ABNORMAL HIGH (ref 36.0–46.0)
Hemoglobin: 15.9 g/dL — ABNORMAL HIGH (ref 12.0–15.0)
Immature Granulocytes: 1 %
Lymphocytes Relative: 21 %
Lymphs Abs: 2.9 10*3/uL (ref 0.7–4.0)
MCH: 28.9 pg (ref 26.0–34.0)
MCHC: 33.1 g/dL (ref 30.0–36.0)
MCV: 87.5 fL (ref 80.0–100.0)
Monocytes Absolute: 0.4 10*3/uL (ref 0.1–1.0)
Monocytes Relative: 3 %
Neutro Abs: 10.5 10*3/uL — ABNORMAL HIGH (ref 1.7–7.7)
Neutrophils Relative %: 75 %
Platelets: 665 10*3/uL — ABNORMAL HIGH (ref 150–400)
RBC: 5.5 MIL/uL — ABNORMAL HIGH (ref 3.87–5.11)
RDW: 16.5 % — ABNORMAL HIGH (ref 11.5–15.5)
WBC: 14 10*3/uL — ABNORMAL HIGH (ref 4.0–10.5)
nRBC: 0 % (ref 0.0–0.2)

## 2021-09-06 LAB — COMPREHENSIVE METABOLIC PANEL
ALT: 30 U/L (ref 0–44)
AST: 42 U/L — ABNORMAL HIGH (ref 15–41)
Albumin: 4.2 g/dL (ref 3.5–5.0)
Alkaline Phosphatase: 106 U/L (ref 38–126)
Anion gap: >20 — ABNORMAL HIGH (ref 5–15)
BUN: 22 mg/dL — ABNORMAL HIGH (ref 6–20)
CO2: 25 mmol/L (ref 22–32)
Calcium: 8.2 mg/dL — ABNORMAL LOW (ref 8.9–10.3)
Chloride: 94 mmol/L — ABNORMAL LOW (ref 98–111)
Creatinine, Ser: 0.93 mg/dL (ref 0.44–1.00)
GFR, Estimated: >60 mL/min (ref >60)
Glucose, Bld: 88 mg/dL (ref 70–99)
Potassium: 3 mmol/L — ABNORMAL LOW (ref 3.5–5.1)
Sodium: 140 mmol/L (ref 135–145)
Total Bilirubin: 0.1 mg/dL — ABNORMAL LOW (ref 0.3–1.2)
Total Protein: 8 g/dL (ref 6.5–8.1)

## 2021-09-06 LAB — HCG, QUANTITATIVE, PREGNANCY: hCG, Beta Chain, Quant, S: 1 m[IU]/mL (ref <5)

## 2021-09-06 LAB — RAPID URINE DRUG SCREEN, HOSP PERFORMED
Amphetamines: NOT DETECTED
Barbiturates: NOT DETECTED
Benzodiazepines: NOT DETECTED
Cocaine: NOT DETECTED
Opiates: NOT DETECTED
Tetrahydrocannabinol: NOT DETECTED

## 2021-09-06 LAB — CBG MONITORING, ED: Glucose-Capillary: 77 mg/dL (ref 70–99)

## 2021-09-06 LAB — ETHANOL: Alcohol, Ethyl (B): 513 mg/dL (ref <10)

## 2021-09-06 LAB — ACETAMINOPHEN LEVEL: Acetaminophen (Tylenol), Serum: <10 ug/mL — ABNORMAL LOW (ref 10–30)

## 2021-09-06 LAB — SALICYLATE LEVEL: Salicylate Lvl: <7 mg/dL — ABNORMAL LOW (ref 7.0–30.0)

## 2021-09-06 MED ORDER — LACTATED RINGERS IV BOLUS
2000.0000 mL | Freq: Once | INTRAVENOUS | Status: AC
Start: 2021-09-06 — End: 2021-09-06
  Administered 2021-09-06: 2000 mL via INTRAVENOUS

## 2021-09-06 MED ORDER — POTASSIUM CHLORIDE CRYS ER 20 MEQ PO TBCR
40.0000 meq | EXTENDED_RELEASE_TABLET | Freq: Once | ORAL | Status: AC
  Administered 2021-09-06: 40 meq via ORAL
  Filled 2021-09-06: qty 2

## 2021-09-06 NOTE — ED Notes (Signed)
Pt states understanding of dc instructions, importance of follow up. Pt denies questions or concerns and was transported by wheelchair to the main entrance. No belongings left in room upon dc. ? ?

## 2021-09-06 NOTE — ED Provider Notes (Signed)
?[place] DEPT ?[facility] Emergency Department ?Provider Note ?MRN:  [id number]  ?Arrival date & time: 09/06/21    ? ?Chief Complaint   ?Alcohol Intoxication ?  ?History of Present Illness   ?Sheila Graves is a 41 y.o. year-old female presents to the ED with chief complaint of intoxication.  Roommate called EMS.  Thought to be intoxicated with ETOH, but patient unable to provide any history.   ? ?6:37 AM ?Patient is now more awake, but still drowsy.  She is alert to person, place, time.  She states that she is here because she drank too much last night.  She denies any discomfort. ? ? ? ? ?Review of Systems  ?Pertinent review of systems noted in HPI.  ? ? ?Physical Exam  ? ?Vitals:  ? 09/06/21 0424 09/06/21 0600  ?BP: 112/76 (!) 123/94  ?Pulse: 98 (!) 133  ?Resp: 20 20  ?Temp: 98 ?F (36.7 ?C)   ?SpO2: 98% 95%  ?  ?CONSTITUTIONAL:  intoxicated-appearing, NAD ?NEURO:  Asleep, GCS 14 ?EYES:  eyes equal and reactive ?ENT/NECK:  Supple, no stridor  ?CARDIO:  normal rate, regular rhythm, appears well-perfused  ?PULM:  No respiratory distress, CTAB ?GI/GU:  non-distended,  ?MSK/SPINE:  No gross deformities, no edema, moves all extremities  ?SKIN:  no rash, atraumatic ? ? ?*Additional and/or pertinent findings included in MDM below ? ?Diagnostic and Interventional Summary  ? ? ?Labs Reviewed  ?SALICYLATE LEVEL - Abnormal; Notable for the following components:  ?    Result Value  ? Salicylate Lvl [id number] (*)   ? All other components within normal limits  ?ACETAMINOPHEN LEVEL - Abnormal; Notable for the following components:  ? Acetaminophen (Tylenol), Serum <10 (*)   ? All other components within normal limits  ?ETHANOL - Abnormal; Notable for the following components:  ? Alcohol, Ethyl (B) 513 (*)   ? All other components within normal limits  ?CBC WITH DIFFERENTIAL/PLATELET - Abnormal; Notable for the following components:  ? WBC 14.0 (*)   ? RBC 5.50 (*)   ? Hemoglobin 15.9 (*)   ? HCT 48.1 (*)   ? RDW 16.5 (*)    ? Platelets 665 (*)   ? Neutro Abs 10.5 (*)   ? Abs Immature Granulocytes 0.10 (*)   ? All other components within normal limits  ?RAPID URINE DRUG SCREEN, HOSP PERFORMED  ?COMPREHENSIVE METABOLIC PANEL  ?HCG, QUANTITATIVE, PREGNANCY  ?CBG MONITORING, ED  ?  ?No orders to display  ?  ?Medications  ?lactated ringers bolus 2,000 mL (2,000 mLs Intravenous New Bag/Given 09/06/21 0630)  ?  ? ?Procedures  /  Critical Care ?Procedures ? ?ED Course and Medical Decision Making  ?I have reviewed the triage vital signs, the nursing notes, and pertinent available records from the EMR. ? ?Complexity of Problems Addressed: ?High Complexity: Chronic illness with severe exacerbation, requiring immediate evaluation and treatment as below. ?Comorbidities affecting this illness/injury include: ?ETOH abuse ?Social Determinants Affecting Care: ?Complexity of care is increased due to alcoholism. ? ? ?ED Course: ?After considering the following differential, ETOH intoxication, overdose, I ordered labs. ?I personally interpreted the labs which are notable for ETOH of 513.  She will need to sober up prior to discharge . ? ?  ? ?Consultants: ?No consultations were needed in caring for this patient. ? ?Treatment and Plan: ? ?Patient signed out at shift change, will need to sober up prior to discharge.  Looks a bit dry, getting fluids. ? ?Emergency department workup does not suggest an  emergent condition requiring admission or immediate intervention beyond  what has been performed at this time. The patient is safe for discharge and has  been instructed to return immediately for worsening symptoms, change in  symptoms or any other concerns ? ? ? ?Final Clinical Impressions(s) / ED Diagnoses  ? ?  ICD-10-CM   ?1. Alcoholic intoxication without complication ([place])  0000000   ?  ?  ?ED Discharge Orders   ? ? None  ? ?  ?  ? ? ?Discharge Instructions Discussed with and Provided to Patient:  ? ?Discharge Instructions   ?None ?  ? ?  ?Montine Circle, PA-C ?09/06/21 [id number] ? ?  ?Shanon Rosser, MD ?09/06/21 [id number] ? ?

## 2021-09-06 NOTE — ED Provider Notes (Signed)
Care assumed from [place] PA at shift change, please see their note for full detail, but in brief Sharon Rubis is a 41 y.o. female presents with alcohol intoxication. ? ?Plan: Pending CMP and fluids with likely d/c ? ?Physical Exam  ?BP (!) 123/94   Pulse (!) 133   Temp 98 ?F (36.7 ?C) (Oral)   Resp 20   Ht 5\' 10"  (1.778 m)   Wt 63 kg   SpO2 95%   BMI 19.93 kg/m?  ? ?Physical Exam ? ?Procedures  ?Procedures ? ?ED Course / MDM  ?  ?Medical Decision Making ?Amount and/or Complexity of Data Reviewed ?Labs: ordered. ? ?Risk ?Prescription drug management. ? ? ?I independently viewed and interpreted all labs and patient was noted to have a potassium of 3.0.  I gave her 40 mEq potassium p.o. on my reevaluation, patient is feeling much better.  She does not appear acutely intoxicated anymore.  She would like to go home and I think this is a reasonable option.  We will call her roommate to see if she can get her a ride.  All questions were asked and answered and patient was discharged home in good condition.  Return precautions were discussed. ? ? ? ? ?  ? , Janell Quiet ?09/06/21 11/06/21 ? ?  ?[id number], DO ?09/06/21 1540 ? ?

## 2021-09-06 NOTE — ED Triage Notes (Signed)
BIB EMS drom home, was in bed sleeping and friend called EMS because patient is intoxicated  ?

## 2021-09-06 NOTE — Discharge Instructions (Addendum)
Your labs today overall look okay given how intoxicated you were last night.  Your potassium levels were a little low which is why we gave you a supplement while you are here.  Please go home and rest and please drink plenty of water today to help rehydrate your body. ? ? ?

## 2021-09-07 ENCOUNTER — Emergency Department (HOSPITAL_COMMUNITY)
Admission: EM | Admit: 2021-09-07 | Discharge: 2021-09-07 | Disposition: A | Discharge status: Home / Self Care | Payer: Self-pay | Attending: Emergency Medicine | Admitting: Emergency Medicine

## 2021-09-07 ENCOUNTER — Encounter (HOSPITAL_COMMUNITY): Payer: Self-pay

## 2021-09-07 ENCOUNTER — Other Ambulatory Visit: Payer: Self-pay

## 2021-09-07 DIAGNOSIS — E876 Hypokalemia: Secondary | ICD-10-CM

## 2021-09-07 DIAGNOSIS — R252 Cramp and spasm: Secondary | ICD-10-CM | POA: Insufficient documentation

## 2021-09-07 DIAGNOSIS — R1013 Epigastric pain: Secondary | ICD-10-CM | POA: Insufficient documentation

## 2021-09-07 DIAGNOSIS — R000 Tachycardia, unspecified: Secondary | ICD-10-CM | POA: Insufficient documentation

## 2021-09-07 DIAGNOSIS — N179 Acute kidney failure, unspecified: Secondary | ICD-10-CM | POA: Insufficient documentation

## 2021-09-07 DIAGNOSIS — R112 Nausea with vomiting, unspecified: Secondary | ICD-10-CM | POA: Insufficient documentation

## 2021-09-07 DIAGNOSIS — R748 Abnormal levels of other serum enzymes: Secondary | ICD-10-CM | POA: Insufficient documentation

## 2021-09-07 DIAGNOSIS — Z7982 Long term (current) use of aspirin: Secondary | ICD-10-CM | POA: Insufficient documentation

## 2021-09-07 LAB — COMPREHENSIVE METABOLIC PANEL
ALT: 31 U/L (ref 0–44)
AST: 56 U/L — ABNORMAL HIGH (ref 15–41)
Albumin: 4.1 g/dL (ref 3.5–5.0)
Alkaline Phosphatase: 118 U/L (ref 38–126)
Anion gap: 23 — ABNORMAL HIGH (ref 5–15)
BUN: 25 mg/dL — ABNORMAL HIGH (ref 6–20)
CO2: 35 mmol/L — ABNORMAL HIGH (ref 22–32)
Calcium: 8.4 mg/dL — ABNORMAL LOW (ref 8.9–10.3)
Chloride: 74 mmol/L — ABNORMAL LOW (ref 98–111)
Creatinine, Ser: 1.22 mg/dL — ABNORMAL HIGH (ref 0.44–1.00)
GFR, Estimated: 58 mL/min — ABNORMAL LOW (ref >60)
Glucose, Bld: 180 mg/dL — ABNORMAL HIGH (ref 70–99)
Potassium: 3.1 mmol/L — ABNORMAL LOW (ref 3.5–5.1)
Sodium: 132 mmol/L — ABNORMAL LOW (ref 135–145)
Total Bilirubin: 1.6 mg/dL — ABNORMAL HIGH (ref 0.3–1.2)
Total Protein: 7.3 g/dL (ref 6.5–8.1)

## 2021-09-07 LAB — LIPASE, BLOOD: Lipase: 121 U/L — ABNORMAL HIGH (ref 11–51)

## 2021-09-07 LAB — CBC
HCT: 38.1 % (ref 36.0–46.0)
Hemoglobin: 13.1 g/dL (ref 12.0–15.0)
MCH: 29.2 pg (ref 26.0–34.0)
MCHC: 34.4 g/dL (ref 30.0–36.0)
MCV: 85 fL (ref 80.0–100.0)
Platelets: 690 10*3/uL — ABNORMAL HIGH (ref 150–400)
RBC: 4.48 MIL/uL (ref 3.87–5.11)
RDW: 15.5 % (ref 11.5–15.5)
WBC: 13 10*3/uL — ABNORMAL HIGH (ref 4.0–10.5)
nRBC: 0 % (ref 0.0–0.2)

## 2021-09-07 LAB — MAGNESIUM: Magnesium: 1.6 mg/dL — ABNORMAL LOW (ref 1.7–2.4)

## 2021-09-07 LAB — I-STAT BETA HCG BLOOD, ED (MC, WL, AP ONLY): I-stat hCG, quantitative: 6 m[IU]/mL — ABNORMAL HIGH (ref <5)

## 2021-09-07 LAB — PREGNANCY, URINE: Preg Test, Ur: NEGATIVE

## 2021-09-07 MED ORDER — MAGNESIUM 30 MG PO TABS: 30.0000 mg | ORAL_TABLET | Freq: Once | ORAL | 0 refills | Status: AC

## 2021-09-07 MED ORDER — MORPHINE SULFATE (PF) 4 MG/ML IV SOLN
4.0000 mg | Freq: Once | INTRAVENOUS | Status: AC
  Administered 2021-09-07: 4 mg via INTRAVENOUS
  Filled 2021-09-07: qty 1

## 2021-09-07 MED ORDER — POTASSIUM CHLORIDE CRYS ER 20 MEQ PO TBCR: 20.0000 meq | EXTENDED_RELEASE_TABLET | Freq: Once | ORAL | 0 refills | Status: DC

## 2021-09-07 MED ORDER — SODIUM CHLORIDE 0.9 % IV SOLN: Freq: Once | INTRAVENOUS | Status: AC

## 2021-09-07 MED ORDER — ONDANSETRON HCL 4 MG/2ML IJ SOLN
4.0000 mg | Freq: Once | INTRAMUSCULAR | Status: AC
  Administered 2021-09-07: 4 mg via INTRAVENOUS
  Filled 2021-09-07: qty 2

## 2021-09-07 MED ORDER — SODIUM CHLORIDE 0.9 % IV BOLUS
1000.0000 mL | Freq: Once | INTRAVENOUS | Status: AC
  Administered 2021-09-07: 1000 mL via INTRAVENOUS

## 2021-09-07 MED ORDER — DIPHENHYDRAMINE HCL 50 MG/ML IJ SOLN
12.5000 mg | Freq: Once | INTRAMUSCULAR | Status: AC
  Administered 2021-09-07: 12.5 mg via INTRAVENOUS
  Filled 2021-09-07: qty 1

## 2021-09-07 MED ORDER — METOCLOPRAMIDE HCL 5 MG/ML IJ SOLN
10.0000 mg | Freq: Once | INTRAMUSCULAR | Status: AC
  Administered 2021-09-07: 10 mg via INTRAVENOUS
  Filled 2021-09-07: qty 2

## 2021-09-07 MED ORDER — POTASSIUM CHLORIDE 10 MEQ/100ML IV SOLN
10.0000 meq | INTRAVENOUS | Status: AC
  Administered 2021-09-07 (×2): 10 meq via INTRAVENOUS
  Filled 2021-09-07 (×2): qty 100

## 2021-09-07 NOTE — ED Notes (Signed)
Pt complaining of increased pain in hands due to cramping. Pt informed that the provider needs to place ordered before staff are able to give medications.  ? ?Per request pt provided an ice pack while waiting for provider ?

## 2021-09-07 NOTE — ED Notes (Signed)
Pt resting comfortably in bed. Pt is able to relax hands and move them appropriately. Pt able to drink and eat some crackers, denies nausea or abdominal pain.   ?

## 2021-09-07 NOTE — ED Triage Notes (Signed)
Arrives EMS from home with hand cramps and they are contracted. Pt says this generally happens when potassium levels run low.  ?

## 2021-09-07 NOTE — Discharge Instructions (Addendum)
You have been seen and discharged from the emergency department.  Your pancreatic enzyme was slightly elevated.  Your potassium and magnesium was slightly low as well.  Take supplements as directed.  Stay well-hydrated, follow bland diet.  Follow-up with your primary provider for further evaluation and further care. Take home medications as prescribed. If you have any worsening symptoms, worsening abdominal pain, inability to eat/drink, high fevers or further concerns for your health please return to an emergency department for further evaluation. ?

## 2021-09-07 NOTE — ED Provider Notes (Signed)
?[facility] ?Provider Note ? ? ?CSN: [id number] ?Arrival date & time: 09/07/21  0025 ? ?  ? ?History ? ?Chief Complaint  ?Patient presents with  ? Hand Cramps  ? ? ?Sheila Graves is a 41 y.o. female. ? ?HPI ? ?41 year old female past medical history of alcohol abuse, pancreatitis, gastritis presents emergency department with epigastric abdominal pain, nausea/vomiting.  Patient admits to binge drinking over the past couple days.  This morning she developed epigastric pain with nausea/vomiting.  Loose stools but no blood in the emesis or diarrhea.  Denies any fever.  For the last couple hours she has been having hand cramping which she gets frequently when her potassium drops low.  She is had to be on oral potassium supplements before but is not currently on any at this time.  Patient states she has had symptoms like this multiple times before in regards to gastritis/pancreatitis.  Denies any active withdrawal symptoms. ? ?Home Medications ?Prior to Admission medications   ?Medication Sig Start Date End Date Taking? Authorizing Provider  ?aspirin-acetaminophen-caffeine (EXCEDRIN MIGRAINE) 250-250-65 MG tablet Take 2 tablets by mouth every 6 (six) hours as needed for headache or migraine.    [provider]  ?folic acid (FOLVITE) 1 MG tablet Take 1 tablet (1 mg total) by mouth daily. 08/28/21   Meredeth IdeLama, Gagan S, MD  ?Multiple Vitamin (MULTIVITAMIN) capsule Take 1 capsule by mouth daily.    [provider]  ?naltrexone (DEPADE) 50 MG tablet Take 1 tablet (50 mg total) by mouth daily. ?Patient not taking: Reported on 08/27/2021 02/24/21   Simmons-Robinson, Tawanna CoolerMakiera, MD  ?thiamine 100 MG tablet Take 1 tablet (100 mg total) by mouth daily. 08/28/21   Meredeth IdeLama, Gagan S, MD  ?escitalopram (LEXAPRO) 10 MG tablet Take 1 tablet (10 mg total) by mouth daily. ?Patient not taking: Reported on 09/16/2019 03/22/19 11/16/19  Eddie Northhungel, Nishant, MD  ?   ? ?Allergies    ?Patient has no known allergies.    ? ?Review of Systems   ?Review of Systems  ?Constitutional:  Positive for fatigue. Negative for fever.  ?Respiratory:  Negative for shortness of breath.   ?Cardiovascular:  Negative for chest pain.  ?Gastrointestinal:  Positive for abdominal pain, nausea and vomiting. Negative for diarrhea.  ?Musculoskeletal:   ?     Hand cramping  ?Skin:  Negative for rash.  ?Neurological:  Negative for headaches.  ? ?Physical Exam ?Updated Vital Signs ?BP 120/77   Pulse 79   Temp 98.9 ?F (37.2 ?C) (Oral)   Resp 15   SpO2 90%  ?Physical Exam ?Vitals and nursing note reviewed.  ?Constitutional:   ?   General: She is not in acute distress. ?   Appearance: Normal appearance.  ?HENT:  ?   Head: Normocephalic.  ?   Mouth/Throat:  ?   Mouth: Mucous membranes are moist.  ?Cardiovascular:  ?   Rate and Rhythm: Tachycardia present.  ?Pulmonary:  ?   Effort: Pulmonary effort is normal. No respiratory distress.  ?Abdominal:  ?   Palpations: Abdomen is soft.  ?   Tenderness: There is abdominal tenderness. There is no guarding or rebound.  ?Musculoskeletal:  ?   Comments: Bilateral hand cramping and spasm  ?Skin: ?   General: Skin is warm.  ?Neurological:  ?   Mental Status: She is alert and oriented to person, place, and time. Mental status is at baseline.  ?Psychiatric:     ?   Mood and Affect: Mood normal.  ? ? ?  ED Results / Procedures / Treatments   ?Labs ?(all labs ordered are listed, but only abnormal results are displayed) ?Labs Reviewed  ?CBC - Abnormal; Notable for the following components:  ?    Result Value  ? WBC 13.0 (*)   ? Platelets 690 (*)   ? All other components within normal limits  ?COMPREHENSIVE METABOLIC PANEL - Abnormal; Notable for the following components:  ? Sodium 132 (*)   ? Potassium 3.1 (*)   ? Chloride 74 (*)   ? CO2 35 (*)   ? Glucose, Bld 180 (*)   ? BUN 25 (*)   ? Creatinine, Ser 1.22 (*)   ? Calcium 8.4 (*)   ? AST 56 (*)   ? Total Bilirubin 1.6 (*)   ? GFR, Estimated 58 (*)   ? Anion gap 23 (*)   ? All  other components within normal limits  ?LIPASE, BLOOD - Abnormal; Notable for the following components:  ? Lipase 121 (*)   ? All other components within normal limits  ?MAGNESIUM - Abnormal; Notable for the following components:  ? Magnesium 1.6 (*)   ? All other components within normal limits  ?I-STAT BETA HCG BLOOD, ED ([facility], [facility], [facility] ONLY) - Abnormal; Notable for the following components:  ? I-stat hCG, quantitative 6.0 (*)   ? All other components within normal limits  ?PREGNANCY, URINE  ? ? ?EKG ?None ? ?Radiology ?No results found. ? ?Procedures ?Procedures  ? ? ?Medications Ordered in ED ?Medications  ?ondansetron (ZOFRAN) injection 4 mg (4 mg Intravenous Given 09/07/21 0820)  ?metoCLOPramide (REGLAN) injection 10 mg (10 mg Intravenous Given 09/07/21 0821)  ?diphenhydrAMINE (BENADRYL) injection 12.5 mg (12.5 mg Intravenous Given 09/07/21 0821)  ?sodium chloride 0.9 % bolus 1,000 mL (0 mLs Intravenous Stopped 09/07/21 1125)  ?potassium chloride 10 mEq in 100 mL IVPB (0 mEq Intravenous Stopped 09/07/21 1030)  ?morphine (PF) 4 MG/ML injection 4 mg (4 mg Intravenous Given 09/07/21 1200)  ?0.9 %  sodium chloride infusion ( Intravenous New Bag/Given 09/07/21 1200)  ? ? ?ED Course/ Medical Decision Making/ A&P ?  ?                        ?Medical Decision Making ?Amount and/or Complexity of Data Reviewed ?Labs: ordered. ? ?Risk ?Prescription drug management. ? ? ?41 year old female presents emergency department with bilateral hand cramping, epigastric abdominal pain, nausea/vomiting.  Tachycardic on arrival, stable blood pressure.  Admits to binge drinking over the past couple days.  Concern for electrolyte abnormality, gastritis, pancreatitis, acute infection. ? ?Blood work shows mild hypokalemia, magnesium of 1.6, mild AKI.  Pregnancy test is negative, lipase is slightly elevated in the low 100s.  Patient has been given IV fluids, pain medicine, antiemetic and potassium repletion. ? ?On reevaluation she is sitting up,  feels improved, has been able to eat and drink.  Requesting be discharged home.  Patient most likely suffering from gastritis versus mild pancreatitis.  On reevaluation abdomen is benign.  Do not feel the need for emergent CT imaging.  Offered inpatient evaluation but patient prefers discharge and appears stable.  Patient at this time appears safe and stable for discharge and close outpatient follow up. Discharge plan and strict return to ED precautions discussed, patient verbalizes understanding and agreement. ? ? ? ? ? ? ? ?Final Clinical Impression(s) / ED Diagnoses ?Final diagnoses:  ?None  ? ? ?Rx / DC Orders ?ED Discharge Orders   ? ?  None  ? ?  ? ? ?  ?Rozelle Logan, DO ?09/07/21 1310 ? ?

## 2021-09-21 ENCOUNTER — Telehealth: Payer: Self-pay | Admitting: Nurse Practitioner

## 2021-09-21 DIAGNOSIS — E876 Hypokalemia: Secondary | ICD-10-CM

## 2021-09-21 NOTE — Progress Notes (Signed)
?Virtual Visit Consent  ? ?Sheila Graves, you are scheduled for a virtual visit with a [facility] provider today.   ?  ?Just as with appointments in the office, your consent must be obtained to participate.  Your consent will be active for this visit and any virtual visit you may have with one of our providers in the next 365 days.   ?  ?If you have a MyChart account, a copy of this consent can be sent to you electronically.  All virtual visits are billed to your insurance company just like a traditional visit in the office.   ? ?As this is a virtual visit, video technology does not allow for your provider to perform a traditional examination.  This may limit your provider's ability to fully assess your condition.  If your provider identifies any concerns that need to be evaluated in person or the need to arrange testing (such as labs, EKG, etc.), we will make arrangements to do so.   ?  ?Although advances in technology are sophisticated, we cannot ensure that it will always work on either your end or our end.  If the connection with a video visit is poor, the visit may have to be switched to a telephone visit.  With either a video or telephone visit, we are not always able to ensure that we have a secure connection.    ? ?I need to obtain your verbal consent now.   Are you willing to proceed with your visit today?  ?  ?Sheila Graves has provided verbal consent on 09/21/2021 for a virtual visit (video or telephone). ?  ?Gildardo Pounds, NP  ? ?Date: 09/21/2021 2:13 PM ? ? ?Virtual Visit via Video Note  ? ?IGildardo Pounds, connected with  Sheila Graves  ([id number], 1981-02-19) on 09/21/21 at  1:45 PM EDT by a video-enabled telemedicine application and verified that I am speaking with the correct person using two identifiers. ? ?Location: ?Patient: Virtual Visit Location Patient: Home ?Provider: Virtual Visit Location Provider: Home Office ?  ?I discussed the limitations of evaluation and management by telemedicine and  the availability of in person appointments. The patient expressed understanding and agreed to proceed.   ? ?History of Present Illness: ?Sheila Graves is a 41 y.o. who identifies as a female who was assigned female at birth, and is being seen today for request of potassium. She has a history of hypokalemia with recent history of  ED visits for ETOH intoxication and hypokalemia. She notes hand cramping and muscle aches and states these are symptoms she normally experiences when her potassium is low.  ? ?Patient became loud and yells at me "you nurses aren't good for shit" after I explained to her that I could not fill a temporary prescription of potassium without her having a follow up plan. She has no PCP and continues to drink alcohol which is likely the cause of electrolyte imbalance. She needs appropriate follow up with a PCP. She states she does not have insurance and can not afford a PCP.  ? ? ?Problems:  ?Patient Active Problem List  ? Diagnosis Date Noted  ? AKI (acute kidney injury) ([place]) 08/26/2021  ? Hyponatremia 08/26/2021  ? Alcohol intoxication ([place]) 02/22/2021  ? Acute encephalopathy 02/22/2021  ? Hypophosphatemia 07/31/2019  ? Alcohol use disorder, severe, dependence ([place])   ? Prolonged QT interval 03/16/2019  ? Hypokalemia 02/04/2019  ? Major depressive disorder, recurrent, severe without psychotic features ([place]) 01/31/2018  ?  ?Allergies:  No Known Allergies ?Medications:  ?Current Outpatient Medications:  ?  aspirin-acetaminophen-caffeine (EXCEDRIN MIGRAINE) 250-250-65 MG tablet, Take 2 tablets by mouth every 6 (six) hours as needed for headache or migraine., Disp: , Rfl:  ?  folic acid (FOLVITE) 1 MG tablet, Take 1 tablet (1 mg total) by mouth daily., Disp: 30 tablet, Rfl: 0 ?  Multiple Vitamin (MULTIVITAMIN) capsule, Take 1 capsule by mouth daily., Disp: , Rfl:  ?  naltrexone (DEPADE) 50 MG tablet, Take 1 tablet (50 mg total) by mouth daily. (Patient not taking: Reported on 08/27/2021), Disp: 30  tablet, Rfl: 0 ?  potassium chloride SA (KLOR-CON M) 20 MEQ tablet, Take 1 tablet (20 mEq total) by mouth once for 1 dose., Disp: 5 tablet, Rfl: 0 ?  thiamine 100 MG tablet, Take 1 tablet (100 mg total) by mouth daily., Disp: 30 tablet, Rfl: 1 ? ?Observations/Objective: ?Patient is well-developed, well-nourished in no acute distress.  ?Resting comfortably at home.  ?Head is normocephalic, atraumatic.  ?No labored breathing.  ?Speech is clear and coherent with logical content.  ?Patient is alert and oriented at baseline.  ? ? ?Assessment and Plan: ?1. Hypokalemia ?NEEDS TO FOLLOW UP WITH PCP. PLEASE SEE BELOW FOR RECOMMENDATIONS ? ?Follow Up Instructions: ?I discussed the assessment and treatment plan with the patient. The patient was provided an opportunity to ask questions and all were answered. The patient agreed with the plan and demonstrated an understanding of the instructions.  A copy of instructions were sent to the patient via MyChart unless otherwise noted below.  ? ? ?The patient was advised to call back or seek an in-person evaluation if the symptoms worsen or if the condition fails to improve as anticipated. ? ?Time:  ?I spent 10 minutes with the patient via telehealth technology discussing the above problems/concerns.   ? ?Gildardo Pounds, NP  ?

## 2021-09-21 NOTE — Progress Notes (Signed)
Patient completed a virtual urgent care visit.  ?

## 2021-09-21 NOTE — Patient Instructions (Signed)
?  Sheila Graves, thank you for joining Sheila Pounds, NP for today's virtual visit.  While this provider is not your primary care provider (PCP), if your PCP is located in our provider database this encounter information will be shared with them immediately following your visit. ? ?Consent: ?(Patient) Sheila Graves provided verbal consent for this virtual visit at the beginning of the encounter. ? ?Current Medications: ? ?Current Outpatient Medications:  ?  aspirin-acetaminophen-caffeine (EXCEDRIN MIGRAINE) 250-250-65 MG tablet, Take 2 tablets by mouth every 6 (six) hours as needed for headache or migraine., Disp: , Rfl:  ?  folic acid (FOLVITE) 1 MG tablet, Take 1 tablet (1 mg total) by mouth daily., Disp: 30 tablet, Rfl: 0 ?  Multiple Vitamin (MULTIVITAMIN) capsule, Take 1 capsule by mouth daily., Disp: , Rfl:  ?  naltrexone (DEPADE) 50 MG tablet, Take 1 tablet (50 mg total) by mouth daily. (Patient not taking: Reported on 08/27/2021), Disp: 30 tablet, Rfl: 0 ?  potassium chloride SA (KLOR-CON M) 20 MEQ tablet, Take 1 tablet (20 mEq total) by mouth once for 1 dose., Disp: 5 tablet, Rfl: 0 ?  thiamine 100 MG tablet, Take 1 tablet (100 mg total) by mouth daily., Disp: 30 tablet, Rfl: 1  ? ?Medications ordered in this encounter:  ?No orders of the defined types were placed in this encounter. ?  ? ?*If you need refills on other medications prior to your next appointment, please contact your pharmacy* ? ?Follow-Up: ?Call back or seek an in-person evaluation if the symptoms worsen or if the condition fails to improve as anticipated. ? ?Other Instructions ?NEEDS TO FOLLOW UP WITH PCP. PLEASE SEE BELOW FOR RECOMMENDATIONS ? ? ?If you have been instructed to have an in-person evaluation today at a local Urgent Care facility, please use the link below. It will take you to a list of all of our available [facility], including address, phone number and hours of operation. Please do not delay care.  ?[facility] ? ?If you or a family member do not have a primary care provider, use the link below to schedule a visit and establish care. When you choose a [facility] primary care physician or advanced practice provider, you gain a long-term partner in health. ?Find a Primary Care Provider ? ?Learn more about [facility] in-office and virtual care options: ?[facility]  ?

## 2021-10-01 ENCOUNTER — Emergency Department (HOSPITAL_COMMUNITY): Admission: EM | Admit: 2021-10-01 | Discharge: 2021-10-01 | Discharge status: Against Medical Advice | Payer: Self-pay

## 2021-10-01 ENCOUNTER — Emergency Department (HOSPITAL_BASED_OUTPATIENT_CLINIC_OR_DEPARTMENT_OTHER)
Admission: EM | Admit: 2021-10-01 | Discharge: 2021-10-02 | Disposition: A | Discharge status: Home / Self Care | Payer: Self-pay | Attending: Emergency Medicine | Admitting: Emergency Medicine

## 2021-10-01 ENCOUNTER — Encounter (HOSPITAL_BASED_OUTPATIENT_CLINIC_OR_DEPARTMENT_OTHER): Payer: Self-pay | Admitting: Emergency Medicine

## 2021-10-01 ENCOUNTER — Other Ambulatory Visit: Payer: Self-pay

## 2021-10-01 DIAGNOSIS — E876 Hypokalemia: Secondary | ICD-10-CM | POA: Insufficient documentation

## 2021-10-01 DIAGNOSIS — Z7982 Long term (current) use of aspirin: Secondary | ICD-10-CM | POA: Insufficient documentation

## 2021-10-01 LAB — CBC WITH DIFFERENTIAL/PLATELET
Abs Immature Granulocytes: 0.01 10*3/uL (ref 0.00–0.07)
Basophils Absolute: 0 10*3/uL (ref 0.0–0.1)
Basophils Relative: 0 %
Eosinophils Absolute: 0 10*3/uL (ref 0.0–0.5)
Eosinophils Relative: 0 %
HCT: 36.8 % (ref 36.0–46.0)
Hemoglobin: 12.1 g/dL (ref 12.0–15.0)
Immature Granulocytes: 0 %
Lymphocytes Relative: 34 %
Lymphs Abs: 1.9 10*3/uL (ref 0.7–4.0)
MCH: 29.2 pg (ref 26.0–34.0)
MCHC: 32.9 g/dL (ref 30.0–36.0)
MCV: 88.7 fL (ref 80.0–100.0)
Monocytes Absolute: 0.5 10*3/uL (ref 0.1–1.0)
Monocytes Relative: 8 %
Neutro Abs: 3.4 10*3/uL (ref 1.7–7.7)
Neutrophils Relative %: 58 %
Platelets: 244 10*3/uL (ref 150–400)
RBC: 4.15 MIL/uL (ref 3.87–5.11)
RDW: 15.9 % — ABNORMAL HIGH (ref 11.5–15.5)
WBC: 5.7 10*3/uL (ref 4.0–10.5)
nRBC: 0 % (ref 0.0–0.2)

## 2021-10-01 LAB — COMPREHENSIVE METABOLIC PANEL
ALT: 17 U/L (ref 0–44)
AST: 33 U/L (ref 15–41)
Albumin: 4.8 g/dL (ref 3.5–5.0)
Alkaline Phosphatase: 91 U/L (ref 38–126)
Anion gap: 12 (ref 5–15)
BUN: 12 mg/dL (ref 6–20)
CO2: 31 mmol/L (ref 22–32)
Calcium: 9.9 mg/dL (ref 8.9–10.3)
Chloride: 90 mmol/L — ABNORMAL LOW (ref 98–111)
Creatinine, Ser: 0.81 mg/dL (ref 0.44–1.00)
GFR, Estimated: >60 mL/min (ref >60)
Glucose, Bld: 105 mg/dL — ABNORMAL HIGH (ref 70–99)
Potassium: 2.9 mmol/L — ABNORMAL LOW (ref 3.5–5.1)
Sodium: 133 mmol/L — ABNORMAL LOW (ref 135–145)
Total Bilirubin: 0.8 mg/dL (ref 0.3–1.2)
Total Protein: 7.7 g/dL (ref 6.5–8.1)

## 2021-10-01 LAB — MAGNESIUM: Magnesium: 1.7 mg/dL (ref 1.7–2.4)

## 2021-10-01 MED ORDER — POTASSIUM CHLORIDE CRYS ER 20 MEQ PO TBCR
80.0000 meq | EXTENDED_RELEASE_TABLET | Freq: Once | ORAL | Status: AC
  Administered 2021-10-01: 80 meq via ORAL
  Filled 2021-10-01: qty 4

## 2021-10-01 NOTE — ED Triage Notes (Signed)
Patient reports that she believes her potassium is low because her hands are cramping.  Patient states this normally happens when her potassium is low.  ?

## 2021-10-01 NOTE — ED Notes (Signed)
X1 no response 

## 2021-10-02 ENCOUNTER — Other Ambulatory Visit (HOSPITAL_COMMUNITY): Payer: Self-pay

## 2021-10-02 ENCOUNTER — Encounter (HOSPITAL_BASED_OUTPATIENT_CLINIC_OR_DEPARTMENT_OTHER): Payer: Self-pay | Admitting: Emergency Medicine

## 2021-10-02 MED ORDER — POTASSIUM CHLORIDE ER 10 MEQ PO TBCR
10.0000 meq | EXTENDED_RELEASE_TABLET | Freq: Two times a day (BID) | ORAL | 0 refills | Status: DC
  Filled 2021-10-02: qty 10, 5d supply, fill #0

## 2021-10-02 NOTE — ED Provider Notes (Signed)
?[facility] EMERGENCY DEPT ?Provider Note ? ? ?CSN: [id number] ?Arrival date & time: 10/01/21  2059 ? ?  ? ?History ? ?Chief Complaint  ?Patient presents with  ? Hypokalemia  ? ? ?Sheila Graves is a 41 y.o. female. ? ?The history is provided by the patient.  ?Illness ?Location:  Body ?Quality:  Low potassium ?Severity:  Moderate ?Onset quality:  Gradual ?Duration: weeks. ?Timing:  Constant ?Progression:  Worsening ?Chronicity:  Chronic ?Context:  Knows she has chronic low potassium but no insurance so can't get refill on RX ?Relieved by:  Nothing ?Worsened by:  Nothing ?Ineffective treatments:  None ?Associated symptoms: no shortness of breath, no sore throat, no vomiting and no wheezing   ?Patient with hypokalemia who is off her potassium due to insurance comes in for low potassium.   ?  ?Past Medical History:  ?Diagnosis Date  ? Alcohol withdrawal seizure (HCC)   ? Alcoholic (HCC)   ? ETOH abuse   ? Hypokalemia   ? Pancreatitis   ? Thyroid disease   ? ? ?Home Medications ?Prior to Admission medications   ?Medication Sig Start Date End Date Taking? Authorizing Provider  ?aspirin-acetaminophen-caffeine (EXCEDRIN MIGRAINE) 250-250-65 MG tablet Take 2 tablets by mouth every 6 (six) hours as needed for headache or migraine.    [provider]  ?folic acid (FOLVITE) 1 MG tablet Take 1 tablet (1 mg total) by mouth daily. 08/28/21   Meredeth IdeLama, Gagan S, MD  ?Multiple Vitamin (MULTIVITAMIN) capsule Take 1 capsule by mouth daily.    [provider]  ?naltrexone (DEPADE) 50 MG tablet Take 1 tablet (50 mg total) by mouth daily. ?Patient not taking: Reported on 08/27/2021 02/24/21   Simmons-Robinson, Tawanna CoolerMakiera, MD  ?potassium chloride SA (KLOR-CON M) 20 MEQ tablet Take 1 tablet (20 mEq total) by mouth once for 1 dose. 09/07/21 09/07/21  Horton, Clabe SealKristie M, DO  ?thiamine 100 MG tablet Take 1 tablet (100 mg total) by mouth daily. 08/28/21   Meredeth IdeLama, Gagan S, MD  ?escitalopram (LEXAPRO) 10 MG tablet Take 1 tablet (10 mg  total) by mouth daily. ?Patient not taking: Reported on 09/16/2019 03/22/19 11/16/19  Eddie Northhungel, Nishant, MD  ?   ? ?Allergies    ?Patient has no known allergies.   ? ?Review of Systems   ?Review of Systems  ?HENT:  Negative for sore throat.   ?Eyes:  Negative for redness.  ?Respiratory:  Negative for shortness of breath and wheezing.   ?Gastrointestinal:  Negative for vomiting.  ?Genitourinary:  Negative for difficulty urinating.  ?Neurological:  Negative for facial asymmetry.  ?Psychiatric/Behavioral:  Negative for agitation.   ?All other systems reviewed and are negative. ? ?Physical Exam ?Updated Vital Signs ?BP 109/84   Pulse 81   Temp 98.4 ?F (36.9 ?C)   Resp 16   Ht 5\' 10"  (1.778 m)   Wt 63.5 kg   SpO2 99%   BMI 20.09 kg/m?  ?Physical Exam ?Vitals and nursing note reviewed.  ?Constitutional:   ?   General: She is not in acute distress. ?   Appearance: Normal appearance.  ?HENT:  ?   Head: Normocephalic and atraumatic.  ?   Nose: Nose normal.  ?Eyes:  ?   Conjunctiva/sclera: Conjunctivae normal.  ?   Pupils: Pupils are equal, round, and reactive to light.  ?Cardiovascular:  ?   Rate and Rhythm: Normal rate and regular rhythm.  ?   Pulses: Normal pulses.  ?   Heart sounds: Normal heart sounds.  ?Pulmonary:  ?  Effort: Pulmonary effort is normal.  ?   Breath sounds: Normal breath sounds.  ?Abdominal:  ?   General: Bowel sounds are normal.  ?   Palpations: Abdomen is soft.  ?   Tenderness: There is no abdominal tenderness. There is no guarding.  ?Musculoskeletal:     ?   General: Normal range of motion.  ?   Cervical back: Normal range of motion and neck supple.  ?Skin: ?   General: Skin is warm and dry.  ?   Capillary Refill: Capillary refill takes less than 2 seconds.  ?Neurological:  ?   General: No focal deficit present.  ?   Mental Status: She is alert and oriented to person, place, and time.  ?   Deep Tendon Reflexes: Reflexes normal.  ?Psychiatric:     ?   Mood and Affect: Mood normal.     ?   Behavior:  Behavior normal.  ? ? ?ED Results / Procedures / Treatments   ?Labs ?(all labs ordered are listed, but only abnormal results are displayed) ?Results for orders placed or performed during the hospital encounter of 10/01/21  ?Comprehensive metabolic panel  ?Result Value Ref Range  ? Sodium 133 (L) 135 - 145 mmol/L  ? Potassium 2.9 (L) 3.5 - 5.1 mmol/L  ? Chloride 90 (L) 98 - 111 mmol/L  ? CO2 31 22 - 32 mmol/L  ? Glucose, Bld 105 (H) 70 - 99 mg/dL  ? BUN 12 6 - 20 mg/dL  ? Creatinine, Ser 0.81 0.44 - 1.00 mg/dL  ? Calcium 9.9 8.9 - 10.3 mg/dL  ? Total Protein 7.7 6.5 - 8.1 g/dL  ? Albumin 4.8 3.5 - 5.0 g/dL  ? AST 33 15 - 41 U/L  ? ALT 17 0 - 44 U/L  ? Alkaline Phosphatase 91 38 - 126 U/L  ? Total Bilirubin 0.8 0.3 - 1.2 mg/dL  ? GFR, Estimated >60 >60 mL/min  ? Anion gap 12 5 - 15  ?CBC with Differential  ?Result Value Ref Range  ? WBC 5.7 4.0 - 10.5 K/uL  ? RBC 4.15 3.87 - 5.11 MIL/uL  ? Hemoglobin 12.1 12.0 - 15.0 g/dL  ? HCT 36.8 36.0 - 46.0 %  ? MCV 88.7 80.0 - 100.0 fL  ? MCH 29.2 26.0 - 34.0 pg  ? MCHC 32.9 30.0 - 36.0 g/dL  ? RDW 15.9 (H) 11.5 - 15.5 %  ? Platelets 244 150 - 400 K/uL  ? nRBC 0.0 0.0 - 0.2 %  ? Neutrophils Relative % 58 %  ? Neutro Abs 3.4 1.7 - 7.7 K/uL  ? Lymphocytes Relative 34 %  ? Lymphs Abs 1.9 0.7 - 4.0 K/uL  ? Monocytes Relative 8 %  ? Monocytes Absolute 0.5 0.1 - 1.0 K/uL  ? Eosinophils Relative 0 %  ? Eosinophils Absolute 0.0 0.0 - 0.5 K/uL  ? Basophils Relative 0 %  ? Basophils Absolute 0.0 0.0 - 0.1 K/uL  ? Immature Granulocytes 0 %  ? Abs Immature Granulocytes 0.01 0.00 - 0.07 K/uL  ?Magnesium  ?Result Value Ref Range  ? Magnesium 1.7 1.7 - 2.4 mg/dL  ? ?No results found. ? EKG Interpretation ? ?Date/Time:  Wednesday Evon Lopezperez 05 2023 21:33:23 EDT ?Ventricular Rate:  91 ?PR Interval:  120 ?QRS Duration: 84 ?QT Interval:  372 ?QTC Calculation: 457 ?R Axis:   69 ?Text Interpretation: Normal sinus rhythm Confirmed by Nicanor Alcon, Gabriela Giannelli ([id number]) on 10/02/2021 12:05:45 AM ?  ? ?   ? ? ? ?Radiology ?  No results found. ? ?Procedures ?Procedures  ? ? ?Medications Ordered in ED ?Medications  ?potassium chloride SA (KLOR-CON M) CR tablet 80 mEq (80 mEq Oral Given 10/01/21 2331)  ? ? ?ED Course/ Medical Decision Making/ A&P ?  ?                        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 ?Amount and/or Complexity of Data Reviewed ?External Data Reviewed: notes. ?   Details: previous visits and potassium reviewed ?Labs: ordered. ?   Details: potassium is low at 2.9 but consistent with past potassiums, normal creatinine, normal white count and normal hemoglobin and platelets ?ECG/medicine tests: ordered and independent interpretation performed. Decision-making details documented in ED Course. ? ?Risk ?Prescription drug management. ?Risk Details: Potassium given in the ED, magnesium is normal.  RX for potassium given.  Stable for discharge with close follow up.   ? ? ? ?Final Clinical Impression(s) / ED Diagnoses ?Final diagnoses:  ?None  ? ?Return for intractable cough, coughing up blood, fevers > 100.4 unrelieved by medication, shortness of breath, intractable vomiting, chest pain, shortness of breath, weakness, numbness, changes in speech, facial asymmetry, abdominal pain, passing out, Inability to tolerate liquids or food, cough, altered mental status or any concerns. No signs of systemic illness or infection. The patient is nontoxic-appearing on exam and vital signs are within normal limits.  ?I have reviewed the triage vital signs and the nursing notes. Pertinent labs & imaging results that were available during my care of the patient were reviewed by me and considered in my medical decision making (see chart for details). After history, exam, and medical workup I feel the patient has been appropriately medically screened and is safe for discharge home. Pertinent diagnoses were discussed with the patient. Patient was given return  precautions.  ?Rx / DC Orders ?ED Discharge Orders   ? ? None  ? ?  ? ? ?  ?Gaspard Isbell, MD ?10/02/21 0007 ? ?

## 2021-10-07 ENCOUNTER — Emergency Department (HOSPITAL_COMMUNITY)
Admission: EM | Admit: 2021-10-07 | Discharge: 2021-10-07 | Discharge status: Against Medical Advice | Payer: Self-pay | Attending: Emergency Medicine | Admitting: Emergency Medicine

## 2021-10-07 ENCOUNTER — Other Ambulatory Visit: Payer: Self-pay

## 2021-10-07 ENCOUNTER — Encounter (HOSPITAL_COMMUNITY): Payer: Self-pay

## 2021-10-07 DIAGNOSIS — F1020 Alcohol dependence, uncomplicated: Secondary | ICD-10-CM | POA: Diagnosis present

## 2021-10-07 DIAGNOSIS — N179 Acute kidney failure, unspecified: Secondary | ICD-10-CM

## 2021-10-07 DIAGNOSIS — F10939 Alcohol use, unspecified with withdrawal, unspecified: Secondary | ICD-10-CM | POA: Diagnosis present

## 2021-10-07 DIAGNOSIS — F10930 Alcohol use, unspecified with withdrawal, uncomplicated: Principal | ICD-10-CM

## 2021-10-07 DIAGNOSIS — Z7982 Long term (current) use of aspirin: Secondary | ICD-10-CM | POA: Insufficient documentation

## 2021-10-07 DIAGNOSIS — F10239 Alcohol dependence with withdrawal, unspecified: Secondary | ICD-10-CM | POA: Insufficient documentation

## 2021-10-07 DIAGNOSIS — F10931 Alcohol use, unspecified with withdrawal delirium: Secondary | ICD-10-CM

## 2021-10-07 DIAGNOSIS — R112 Nausea with vomiting, unspecified: Secondary | ICD-10-CM

## 2021-10-07 LAB — URINALYSIS, ROUTINE W REFLEX MICROSCOPIC
Bacteria, UA: NONE SEEN
Bilirubin Urine: NEGATIVE
Glucose, UA: NEGATIVE mg/dL
Ketones, ur: 80 mg/dL — AB
Leukocytes,Ua: NEGATIVE
Nitrite: NEGATIVE
Protein, ur: 100 mg/dL — AB
Specific Gravity, Urine: 1.023 (ref 1.005–1.030)
pH: 5 (ref 5.0–8.0)

## 2021-10-07 LAB — COMPREHENSIVE METABOLIC PANEL WITH GFR
ALT: 57 U/L — ABNORMAL HIGH (ref 0–44)
AST: 169 U/L — ABNORMAL HIGH (ref 15–41)
Albumin: 4.3 g/dL (ref 3.5–5.0)
Alkaline Phosphatase: 93 U/L (ref 38–126)
Anion gap: 29 — ABNORMAL HIGH (ref 5–15)
BUN: 40 mg/dL — ABNORMAL HIGH (ref 6–20)
CO2: 24 mmol/L (ref 22–32)
Calcium: 7.2 mg/dL — ABNORMAL LOW (ref 8.9–10.3)
Chloride: 83 mmol/L — ABNORMAL LOW (ref 98–111)
Creatinine, Ser: 2.37 mg/dL — ABNORMAL HIGH (ref 0.44–1.00)
GFR, Estimated: 26 mL/min — ABNORMAL LOW (ref >60)
Glucose, Bld: 197 mg/dL — ABNORMAL HIGH (ref 70–99)
Potassium: 4.5 mmol/L (ref 3.5–5.1)
Sodium: 136 mmol/L (ref 135–145)
Total Bilirubin: 1.4 mg/dL — ABNORMAL HIGH (ref 0.3–1.2)
Total Protein: 6.9 g/dL (ref 6.5–8.1)

## 2021-10-07 LAB — CBG MONITORING, ED
Glucose-Capillary: 107 mg/dL — ABNORMAL HIGH (ref 70–99)
Glucose-Capillary: 150 mg/dL — ABNORMAL HIGH (ref 70–99)

## 2021-10-07 LAB — HEPATITIS PANEL, ACUTE
HCV Ab: NONREACTIVE
Hep A IgM: NONREACTIVE
Hep B C IgM: NONREACTIVE
Hepatitis B Surface Ag: NONREACTIVE

## 2021-10-07 LAB — CBC WITH DIFFERENTIAL/PLATELET
Abs Immature Granulocytes: 0.1 10*3/uL — ABNORMAL HIGH (ref 0.00–0.07)
Basophils Absolute: 0 10*3/uL (ref 0.0–0.1)
Basophils Relative: 0 %
Eosinophils Absolute: 0 10*3/uL (ref 0.0–0.5)
Eosinophils Relative: 0 %
HCT: 35.2 % — ABNORMAL LOW (ref 36.0–46.0)
Hemoglobin: 11.8 g/dL — ABNORMAL LOW (ref 12.0–15.0)
Immature Granulocytes: 1 %
Lymphocytes Relative: 5 %
Lymphs Abs: 0.5 10*3/uL — ABNORMAL LOW (ref 0.7–4.0)
MCH: 29.6 pg (ref 26.0–34.0)
MCHC: 33.5 g/dL (ref 30.0–36.0)
MCV: 88.2 fL (ref 80.0–100.0)
Monocytes Absolute: 0.7 10*3/uL (ref 0.1–1.0)
Monocytes Relative: 8 %
Neutro Abs: 8.1 10*3/uL — ABNORMAL HIGH (ref 1.7–7.7)
Neutrophils Relative %: 86 %
Platelets: 168 10*3/uL (ref 150–400)
RBC: 3.99 MIL/uL (ref 3.87–5.11)
RDW: 17.4 % — ABNORMAL HIGH (ref 11.5–15.5)
WBC: 9.4 10*3/uL (ref 4.0–10.5)
nRBC: 0.2 % (ref 0.0–0.2)

## 2021-10-07 LAB — ETHANOL: Alcohol, Ethyl (B): <10 mg/dL (ref <10)

## 2021-10-07 LAB — HCG, QUANTITATIVE, PREGNANCY: hCG, Beta Chain, Quant, S: 1 m[IU]/mL (ref <5)

## 2021-10-07 LAB — HEMOGLOBIN A1C
Hgb A1c MFr Bld: 5.1 % (ref 4.8–5.6)
Mean Plasma Glucose: 99.67 mg/dL

## 2021-10-07 LAB — LIPASE, BLOOD: Lipase: 41 U/L (ref 11–51)

## 2021-10-07 LAB — PROTIME-INR
INR: 1.1 (ref 0.8–1.2)
Prothrombin Time: 13.9 seconds (ref 11.4–15.2)

## 2021-10-07 LAB — MAGNESIUM: Magnesium: 2 mg/dL (ref 1.7–2.4)

## 2021-10-07 LAB — I-STAT BETA HCG BLOOD, ED (MC, WL, AP ONLY): I-stat hCG, quantitative: 15.5 m[IU]/mL — ABNORMAL HIGH (ref <5)

## 2021-10-07 MED ORDER — LORAZEPAM 2 MG/ML IJ SOLN
2.0000 mg | Freq: Once | INTRAMUSCULAR | Status: AC
Start: 2021-10-07 — End: 2021-10-07
  Administered 2021-10-07: 2 mg via INTRAVENOUS
  Filled 2021-10-07: qty 1

## 2021-10-07 MED ORDER — LOPERAMIDE HCL 2 MG PO CAPS: 2.0000 mg | ORAL_CAPSULE | ORAL | Status: DC | PRN

## 2021-10-07 MED ORDER — PANTOPRAZOLE SODIUM 40 MG IV SOLR: 40.0000 mg | Freq: Every day | INTRAVENOUS | Status: DC

## 2021-10-07 MED ORDER — THIAMINE HCL 100 MG/ML IJ SOLN: 100.0000 mg | Freq: Every day | INTRAMUSCULAR | Status: DC

## 2021-10-07 MED ORDER — LORAZEPAM 2 MG/ML IJ SOLN: 1.0000 mg | INTRAMUSCULAR | Status: DC | PRN

## 2021-10-07 MED ORDER — ENOXAPARIN SODIUM 40 MG/0.4ML IJ SOSY
40.0000 mg | PREFILLED_SYRINGE | INTRAMUSCULAR | Status: DC
  Administered 2021-10-07: 40 mg via SUBCUTANEOUS
  Filled 2021-10-07: qty 0.4

## 2021-10-07 MED ORDER — ACETAMINOPHEN 325 MG PO TABS
650.0000 mg | ORAL_TABLET | Freq: Four times a day (QID) | ORAL | Status: DC | PRN
Start: 2021-10-07 — End: 2021-10-07

## 2021-10-07 MED ORDER — ADULT MULTIVITAMIN W/MINERALS CH
1.0000 | ORAL_TABLET | Freq: Every day | ORAL | Status: DC
  Administered 2021-10-07: 1 via ORAL
  Filled 2021-10-07: qty 1

## 2021-10-07 MED ORDER — CHLORDIAZEPOXIDE HCL 25 MG PO CAPS: 25.0000 mg | ORAL_CAPSULE | Freq: Three times a day (TID) | ORAL | Status: DC

## 2021-10-07 MED ORDER — THIAMINE HCL 100 MG PO TABS
100.0000 mg | ORAL_TABLET | Freq: Every day | ORAL | Status: DC
  Administered 2021-10-07: 100 mg via ORAL
  Filled 2021-10-07: qty 1

## 2021-10-07 MED ORDER — CHLORDIAZEPOXIDE HCL 25 MG PO CAPS: 50.0000 mg | ORAL_CAPSULE | Freq: Three times a day (TID) | ORAL | Status: DC

## 2021-10-07 MED ORDER — CHLORDIAZEPOXIDE HCL 25 MG PO CAPS: 25.0000 mg | ORAL_CAPSULE | Freq: Two times a day (BID) | ORAL | Status: DC

## 2021-10-07 MED ORDER — FOLIC ACID 1 MG PO TABS
1.0000 mg | ORAL_TABLET | Freq: Every day | ORAL | Status: DC
  Administered 2021-10-07: 1 mg via ORAL
  Filled 2021-10-07: qty 1

## 2021-10-07 MED ORDER — LORAZEPAM 1 MG PO TABS
1.0000 mg | ORAL_TABLET | ORAL | Status: DC | PRN
  Administered 2021-10-07: 1 mg via ORAL
  Filled 2021-10-07: qty 1

## 2021-10-07 MED ORDER — LACTATED RINGERS IV BOLUS
2000.0000 mL | Freq: Once | INTRAVENOUS | Status: AC
  Administered 2021-10-07: 2000 mL via INTRAVENOUS

## 2021-10-07 MED ORDER — CHLORDIAZEPOXIDE HCL 25 MG PO CAPS
50.0000 mg | ORAL_CAPSULE | Freq: Four times a day (QID) | ORAL | Status: DC
  Administered 2021-10-07 (×2): 50 mg via ORAL
  Filled 2021-10-07 (×2): qty 2

## 2021-10-07 MED ORDER — ACETAMINOPHEN 650 MG RE SUPP: 650.0000 mg | Freq: Four times a day (QID) | RECTAL | Status: DC | PRN

## 2021-10-07 MED ORDER — PANTOPRAZOLE SODIUM 40 MG IV SOLR
40.0000 mg | Freq: Once | INTRAVENOUS | Status: AC
  Administered 2021-10-07: 40 mg via INTRAVENOUS
  Filled 2021-10-07: qty 10

## 2021-10-07 MED ORDER — INSULIN ASPART 100 UNIT/ML IJ SOLN: 0.0000 [IU] | INTRAMUSCULAR | Status: DC

## 2021-10-07 NOTE — ED Provider Notes (Signed)
?[facility] ?Provider Note ? ? ?CSN: [id number] ?Arrival date & time: 10/07/21  0025 ? ?  ? ?History ? ?Chief Complaint  ?Patient presents with  ? Emesis  ? ? ?Sheila Graves is a 41 y.o. female. ? ?HPI ?41 year old female history of alcohol abuse presents today complaining of nausea and vomiting with hand cramping.  Patient states she has been through alcohol withdrawal in the past.  Today the alcohol withdrawal and was precipitated by running out of alcohol.  After that she began having nausea and vomiting.  Patient reports approximately 20 episodes of nonbilious nonbloody emesis she has some diffuse abdominal discomfort.  Last menstrual period was last week and normal.  Patient does not think that she is pregnant due to sexual activity with women only.  She denies fever, chills, chest pain, dyspnea. ? ?  ? ?Home Medications ?Prior to Admission medications   ?Medication Sig Start Date End Date Taking? Authorizing Provider  ?aspirin-acetaminophen-caffeine (EXCEDRIN MIGRAINE) 250-250-65 MG tablet Take 2 tablets by mouth every 6 (six) hours as needed for headache or migraine.    [provider]  ?folic acid (FOLVITE) 1 MG tablet Take 1 tablet (1 mg total) by mouth daily. 08/28/21   Meredeth IdeLama, Gagan S, MD  ?Multiple Vitamin (MULTIVITAMIN) capsule Take 1 capsule by mouth daily.    [provider]  ?naltrexone (DEPADE) 50 MG tablet Take 1 tablet (50 mg total) by mouth daily. ?Patient not taking: Reported on 08/27/2021 02/24/21   Simmons-Robinson, Tawanna CoolerMakiera, MD  ?potassium chloride (KLOR-CON) 10 MEQ tablet Take 1 tablet (10 mEq total) by mouth 2 (two) times daily. 10/02/21   Palumbo, April, MD  ?potassium chloride SA (KLOR-CON M) 20 MEQ tablet Take 1 tablet (20 mEq total) by mouth once for 1 dose. 09/07/21 09/07/21  Horton, Clabe SealKristie M, DO  ?thiamine 100 MG tablet Take 1 tablet (100 mg total) by mouth daily. 08/28/21   Meredeth IdeLama, Gagan S, MD  ?escitalopram (LEXAPRO) 10 MG tablet Take 1 tablet  (10 mg total) by mouth daily. ?Patient not taking: Reported on 09/16/2019 03/22/19 11/16/19  Eddie Northhungel, Nishant, MD  ?   ? ?Allergies    ?Patient has no known allergies.   ? ?Review of Systems   ?Review of Systems  ?All other systems reviewed and are negative. ? ?Physical Exam ?Updated Vital Signs ?BP 132/82 (BP Location: Left Arm)   Pulse (!) 122   Temp 98.9 ?F (37.2 ?C) (Oral)   Resp (!) 21   Ht 1.778 m (5\' 10" )   Wt 63.5 kg   SpO2 100%   BMI 20.09 kg/m?  ?Physical Exam ?Vitals and nursing note reviewed.  ?Constitutional:   ?   Appearance: Normal appearance.  ?HENT:  ?   Head: Normocephalic.  ?   Right Ear: External ear normal.  ?   Left Ear: External ear normal.  ?   Nose: Nose normal.  ?   Mouth/Throat:  ?   Mouth: Mucous membranes are dry.  ?Eyes:  ?   Extraocular Movements: Extraocular movements intact.  ?   Pupils: Pupils are equal, round, and reactive to light.  ?Cardiovascular:  ?   Rate and Rhythm: Regular rhythm. Tachycardia present.  ?   Pulses: Normal pulses.  ?   Heart sounds: Normal heart sounds.  ?Pulmonary:  ?   Effort: Pulmonary effort is normal.  ?Abdominal:  ?   General: Abdomen is flat. Bowel sounds are normal.  ?   Tenderness: There is abdominal tenderness.  ?  Comments: Mild diffuse tenderness to palpation  ?Musculoskeletal:     ?   General: Normal range of motion.  ?   Cervical back: Normal range of motion.  ?Skin: ?   General: Skin is warm and dry.  ?   Capillary Refill: Capillary refill takes less than 2 seconds.  ?Neurological:  ?   General: No focal deficit present.  ?   Mental Status: She is alert and oriented to person, place, and time.  ?   Cranial Nerves: No cranial nerve deficit.  ?   Sensory: No sensory deficit.  ?   Motor: No weakness.  ?   Coordination: Coordination normal.  ?   Comments: Mild diffuse tremor  ?Psychiatric:     ?   Mood and Affect: Mood normal.  ? ? ?ED Results / Procedures / Treatments   ?Labs ?(all labs ordered are listed, but only abnormal results are  displayed) ?Labs Reviewed  ?CBC WITH DIFFERENTIAL/PLATELET - Abnormal; Notable for the following components:  ?    Result Value  ? Hemoglobin 11.8 (*)   ? HCT 35.2 (*)   ? RDW 17.4 (*)   ? Neutro Abs 8.1 (*)   ? Lymphs Abs 0.5 (*)   ? Abs Immature Granulocytes 0.10 (*)   ? All other components within normal limits  ?COMPREHENSIVE METABOLIC PANEL - Abnormal; Notable for the following components:  ? Chloride 83 (*)   ? Glucose, Bld 197 (*)   ? BUN 40 (*)   ? Creatinine, Ser 2.37 (*)   ? Calcium 7.2 (*)   ? AST 169 (*)   ? ALT 57 (*)   ? Total Bilirubin 1.4 (*)   ? GFR, Estimated 26 (*)   ? Anion gap 29 (*)   ? All other components within normal limits  ?URINALYSIS, ROUTINE W REFLEX MICROSCOPIC - Abnormal; Notable for the following components:  ? APPearance HAZY (*)   ? Hgb urine dipstick MODERATE (*)   ? Ketones, ur 80 (*)   ? Protein, ur 100 (*)   ? All other components within normal limits  ?I-STAT BETA HCG BLOOD, ED ([facility], [facility], [facility] ONLY) - Abnormal; Notable for the following components:  ? I-stat hCG, quantitative 15.5 (*)   ? All other components within normal limits  ?LIPASE, BLOOD  ?ETHANOL  ?MAGNESIUM  ?HCG, QUANTITATIVE, PREGNANCY  ? ? ?EKG ?None ? ?Radiology ?No results found. ? ?Procedures ?Procedures  ? ? ?Medications Ordered in ED ?Medications  ?lactated ringers bolus 2,000 mL (has no administration in time range)  ?LORazepam (ATIVAN) injection 2 mg (has no administration in time range)  ?pantoprazole (PROTONIX) injection 40 mg (has no administration in time range)  ? ? ?ED Course/ Medical Decision Making/ A&P ?Clinical Course as of 10/07/21 0913  ?Tue Oct 07, 2021  ?0847 CBC reviewed interpreted and mild anemia at 11.8, normal platelets and white blood cell count [DR]  ?0848 Ethanol ?Alcohol level is less than 10 [DR]  ?8341 Comprehensive metabolic panel(!) ?Complete metabolic reviewed and interpreted with normal sodium potassium ?Chloride decreased to 83 ?Glucose increased to 197 ?BUN and creatinine  significant with acute kidney injury at 40 and 2.37 with last creatinine normal ?AST and ALT L elevated at 169 and 57 ?Total bilirubin elevated at 1.4 [DR]  ?0848 Lipase, blood ?Lipase reviewed interpreted normal [DR]  ?9622 Magnesium ?Magnesium reviewed interpreted normal [DR]  ?0848 Urinalysis, Routine w reflex microscopic(!) ?Urinalysis reviewed and significant for 80 ketones [DR]  ?2979 I-Stat Beta hCG  blood, ED ([facility], [facility], [facility] only)(!) ?I-STAT beta-hCG mildly elevated 15 and quantitative pregnancy from lab at 1 ?Not consistent with pregnancy [DR]  ?  ?Clinical Course User Index ?[DR] Margarita Grizzle, MD  ? ?                        ?Medical Decision Making ?41 year old female presents today complaining of nausea, vomiting, alcohol withdrawal, hands spasming. ?Event was precipitated by acute alcohol withdrawal with patient running out of alcohol. ?She has had associated nausea and vomiting which could represent alcoholic gastritis and patient has some elevated LFTs.  She has not had any blood or bile.  Seems more likely that patient's alcohol withdrawal is precipitating the nausea and vomiting.  However will treat with Protonix. ?Patient being given Ativan here for her alcohol withdrawal symptoms.  Patient is tachycardic and has some tremors ?Patient with acute kidney injury likely secondary to volume depletion.  She has been given 2 L of lactated Ringer's. ?Plan admission for acute alcohol withdrawal, acute kidney injury, and gastritis ? ? ?Amount and/or Complexity of Data Reviewed ?External Data Reviewed: notes. ?   Details: Reviewed last discharge summary ?Labs: ordered. Decision-making details documented in ED Course. ?Discussion of management or test interpretation with external provider(s): Discussed with Dr. Rito Ehrlich, on-call for teaching service.  She will see and admit ? ?Risk ?Prescription drug management. ?Parenteral controlled substances. ? ? ? ? ? ? ? ? ? ? ?Final Clinical Impression(s) / ED  Diagnoses ?Final diagnoses:  ?Alcohol withdrawal syndrome without complication (HCC)  ?Nausea and vomiting, unspecified vomiting type  ?Acute kidney injury (HCC)  ? ? ?Rx / DC Orders ?ED Discharge Orders   ? ? None  ? ?  ? ? ?  ?Dawnielle Christiana, Da

## 2021-10-07 NOTE — ED Notes (Signed)
Pt states she have been throwing up 24-hours d/t Etoh consumption. Pt ran out of alcohol and started throwing up.  ?

## 2021-10-07 NOTE — ED Notes (Signed)
Provided pt two warm blankets.

## 2021-10-07 NOTE — ED Triage Notes (Signed)
Pt BIB EMS with complains of NBNB emesis. Hx of chronic etoh use, still drinking, drank 2 1/5 liquor yesterday. Feels bad, cramping everywhere. ?

## 2021-10-07 NOTE — ED Notes (Signed)
Pt was found wandering at the door. Pt urinated on floor stating she needs to go to restroom. RN assisted pt back in bed and redirected to call light to use when needed. RN cleaned pt floor, changed gown, and linens. RN assisted pt to restroom. Pt ambulated back to room with non-slip resistant socks.  ?

## 2021-10-07 NOTE — ED Notes (Signed)
Provided pt bag lunch 

## 2021-10-07 NOTE — ED Notes (Signed)
NT went to reconnect pt to monitor. Pt was no where to be found. Pt left AMA ?

## 2021-10-07 NOTE — H&P (Signed)
? ?NAME:  Sheila Graves, MRN:  [id number], DOB:  [birth year], LOS: 0 ?ADMISSION DATE:  10/07/2021, Primary: Pcp, No  ?CHIEF COMPLAINT:  alcohol withdrawl  ? ?Medical Service: Internal Medicine Teaching Service    ?     ?Attending Physician: Dr. Dickie La, MD    ?First Contact: Dr. Burnice Logan Pager: [phone]  ?Second Contact: Dr. Evie Lacks Pager: [phone]  ?     ?After Hours (After 5p/  First Contact Pager: [phone]  ?weekends / holidays): Second Contact Pager: [phone]  ? ? ?History of present illness   ?Sheila Graves is a 41 year old female with a long standing history of alcohol use disorder who presented to the ED for withdrawal symptoms after running out of alcohol at home yesterday. She reported non-bloody nausea, vomiting and abdominal pain. Shortly prior to my exam, she received ativan, and was having difficulty staying awake to contribute to history taking. ? ?She does report a history of alcohol withdrawal seizures, although she is unable to tell me when this last happened. She endorses diffuse abdominal pain.  ? ?Past Medical History  ?She,  has a past medical history of Alcohol withdrawal seizure (HCC), Alcoholic (HCC), ETOH abuse, Hypokalemia, Pancreatitis, and Thyroid disease.  ? ?Home Medications    ? ?Prior to Admission medications   ?Medication Sig Start Date End Date Taking? Authorizing Provider  ?aspirin-acetaminophen-caffeine (EXCEDRIN MIGRAINE) 250-250-65 MG tablet Take 2 tablets by mouth every 6 (six) hours as needed for headache or migraine.    [provider]  ?folic acid (FOLVITE) 1 MG tablet Take 1 tablet (1 mg total) by mouth daily. 08/28/21   Meredeth Ide, MD  ?Multiple Vitamin (MULTIVITAMIN) capsule Take 1 capsule by mouth daily.    [provider]  ?naltrexone (DEPADE) 50 MG tablet Take 1 tablet (50 mg total) by mouth daily. ?Patient not taking: Reported on 08/27/2021 02/24/21   Simmons-Robinson, Tawanna Cooler, MD  ?potassium chloride (KLOR-CON) 10 MEQ tablet Take 1 tablet (10 mEq  total) by mouth 2 (two) times daily. 10/02/21   Palumbo, April, MD  ?potassium chloride SA (KLOR-CON M) 20 MEQ tablet Take 1 tablet (20 mEq total) by mouth once for 1 dose. 09/07/21 09/07/21  Horton, Clabe Seal, DO  ?thiamine 100 MG tablet Take 1 tablet (100 mg total) by mouth daily. 08/28/21   Meredeth Ide, MD  ?escitalopram (LEXAPRO) 10 MG tablet Take 1 tablet (10 mg total) by mouth daily. ?Patient not taking: Reported on 09/16/2019 03/22/19 11/16/19  Eddie North, MD  ? ? ?Allergies   ? ?Allergies as of 10/07/2021  ? (No Known Allergies)  ? ? ?Social History  ? reports that she has quit smoking. Her smoking use included cigarettes. She smoked an average of 1 pack per day. She has never used smokeless tobacco. She reports that she does not currently use alcohol. She reports that she does not currently use drugs after having used the following drugs: Heroin.  ? ?Family History   ?Her family history includes Alcohol abuse in an other family member.  ? ? ?Objective   ?Blood pressure 125/81, pulse (!) 122, temperature 98.9 ?F (37.2 ?C), temperature source Oral, resp. rate (!) 21, height 5\' 10"  (1.778 m), weight 63.5 kg, SpO2 100 %. ?   ?General: acutely ill appearing female, laying in bed in no distress ?HEENT: Dry MM ?Cardiac: tachycardic rate, regular rhythm ?Pulm: breathing comfortably on room air, lungs clear ?GI: soft, non-tender to palpation ?Skin: no rash or lesion. Minimal diaphoresis ?Neuro: Received 2mg   of ativan about 10 minutes prior to evaluation. Lethargic, awakens to physical stimuli but quickly falls back asleep. Oriented to person, place and year. Moves all extremities. No tremors.  ?Significant Diagnostic Tests:  ? ? ? ?  Latest Ref Rng & Units 10/07/2021  ? 12:34 AM 10/01/2021  ?  9:30 PM 09/07/2021  ?  1:05 AM  ?CBC  ?WBC 4.0 - 10.5 K/uL 9.4   5.7   13.0    ?Hemoglobin 12.0 - 15.0 g/dL 60.7   37.1   06.2    ?Hematocrit 36.0 - 46.0 % 35.2   36.8   38.1    ?Platelets 150 - 400 K/uL 168   244   690    ? ? ?   Latest Ref Rng & Units 10/07/2021  ? 12:34 AM 10/01/2021  ?  9:30 PM 09/07/2021  ?  1:05 AM  ?BMP  ?Glucose 70 - 99 mg/dL 694   854   627    ?BUN 6 - 20 mg/dL 40   12   25    ?Creatinine 0.44 - 1.00 mg/dL 0.35   0.09   3.81    ?Sodium 135 - 145 mmol/L 136   133   132    ?Potassium 3.5 - 5.1 mmol/L 4.5   2.9   3.1    ?Chloride 98 - 111 mmol/L 83   90   74    ?CO2 22 - 32 mmol/L 24   31   35    ?Calcium 8.9 - 10.3 mg/dL 7.2   9.9   8.4    ? ?ETOH <10 ?Lipase 41 ?Mag 2.0 ? ?Summary  ?68 yof with alcohol use disorder and history of withdrawal seizures requiring admission for treatment and monitoring of high risk alcohol withdrawal. ? ?Assessment & Plan:  ?Principal Problem: ?  Alcohol withdrawal (HCC) ? ?High risk alcohol withdrawal ?-last drink 4/10. ETOH <10 on admission.  ?-lethargic at the time of my evaluation however she was reportedly awake and conversational prior to ativan administration so I suspect that this is related.  ?Hx of alcohol withdrawal seizures ?Admit to progressive care ?Start librium taper ?CIWA with ativan ?Seizure precautions ?Thiamine/folate/multivitamin ?Daily protonix ?Can start zofran if QT is ok on today's EKG ?Tele monitoring ?Will need alcohol cessation counseling prior to discharge ? ?AKI secondary to nausea/vomiting related to above ?On her 2nd liter of LR currently. Will hold off on additional fluids for now ?Repeat labs in AM ? ?Acute on chronically elevated LFTs ?Acute component likely alcohol and dehydration related. platelets normal. No evidence of cirrhosis on [place] from 2020 ?Check viral hepatitis panel ? ?Hyperglycemia. Likely reactive ?Q4h SSI, A1C ? ?Chronic normocytic anemia. Stable ? ?Hx of hypokalemia. Normal on admission. Continue to monitor.  ? ?Hx of QT prolongation.  ?Check EKG ? ?Chronic hCG elevation ? ?Best practice:  ?CODE STATUS: FULL ?DVT for prophylaxis: lovenox ?Dispo: Admit patient to Inpatient with expected length of stay greater than 2 midnights. ? ? ?Elige Radon, MD ?Internal Medicine Resident PGY-3 ?[facility] ?Pager: [phone] ?10/07/2021 10:06 AM  ?  ? ? ? ?

## 2021-10-07 NOTE — ED Provider Triage Note (Signed)
Emergency Medicine Provider Triage Evaluation Note ? ?Sheila Graves , a 41 y.o. female  was evaluated in triage.  Pt complains of NBNB emesis. Hx of chronic etoh use, still drinking, drank 2 1/5 liquor yesterday. Feels bad, cramping everywhere. Hx of Dt, no WD seizures. No HA, falls, CP, SOB, abd pain, unable to keep anything down ? ?Review of Systems  ?Positive: emesis ?Negative: Abd pain ? ?Physical Exam  ?There were no vitals taken for this visit. ?Gen:   Awake, no distress   ?Resp:  Normal effort  ?MSK:   Moves extremities without difficulty, carpal pedal spasms to bl hands ?Other:   ? ?Medical Decision Making  ?Medically screening exam initiated at 12:27 AM.  Appropriate orders placed.  Sheila Graves was informed that the remainder of the evaluation will be completed by another provider, this initial triage assessment does not replace that evaluation, and the importance of remaining in the ED until their evaluation is complete. ? ?Emesis, Etoh abuse ?  ?Sheila Graves A, PA-C ?10/07/21 0034 ? ?

## 2021-10-09 ENCOUNTER — Emergency Department (HOSPITAL_COMMUNITY)
Admission: EM | Admit: 2021-10-09 | Discharge: 2021-10-10 | Disposition: A | Discharge status: Home / Self Care | Payer: Self-pay | Attending: Emergency Medicine | Admitting: Emergency Medicine

## 2021-10-09 ENCOUNTER — Encounter (HOSPITAL_COMMUNITY): Payer: Self-pay

## 2021-10-09 ENCOUNTER — Emergency Department (HOSPITAL_COMMUNITY): Payer: Self-pay

## 2021-10-09 DIAGNOSIS — F10130 Alcohol abuse with withdrawal, uncomplicated: Secondary | ICD-10-CM | POA: Insufficient documentation

## 2021-10-09 DIAGNOSIS — R443 Hallucinations, unspecified: Secondary | ICD-10-CM

## 2021-10-09 DIAGNOSIS — E876 Hypokalemia: Secondary | ICD-10-CM | POA: Insufficient documentation

## 2021-10-09 DIAGNOSIS — E871 Hypo-osmolality and hyponatremia: Secondary | ICD-10-CM | POA: Insufficient documentation

## 2021-10-09 DIAGNOSIS — K852 Alcohol induced acute pancreatitis without necrosis or infection: Secondary | ICD-10-CM | POA: Insufficient documentation

## 2021-10-09 DIAGNOSIS — E039 Hypothyroidism, unspecified: Secondary | ICD-10-CM | POA: Insufficient documentation

## 2021-10-09 DIAGNOSIS — Z7982 Long term (current) use of aspirin: Secondary | ICD-10-CM | POA: Insufficient documentation

## 2021-10-09 DIAGNOSIS — R440 Auditory hallucinations: Secondary | ICD-10-CM | POA: Insufficient documentation

## 2021-10-09 DIAGNOSIS — F10930 Alcohol use, unspecified with withdrawal, uncomplicated: Secondary | ICD-10-CM

## 2021-10-09 LAB — COMPREHENSIVE METABOLIC PANEL
ALT: 46 U/L — ABNORMAL HIGH (ref 0–44)
AST: 72 U/L — ABNORMAL HIGH (ref 15–41)
Albumin: 4.2 g/dL (ref 3.5–5.0)
Alkaline Phosphatase: 76 U/L (ref 38–126)
Anion gap: 8 (ref 5–15)
BUN: 11 mg/dL (ref 6–20)
CO2: 29 mmol/L (ref 22–32)
Calcium: 9.4 mg/dL (ref 8.9–10.3)
Chloride: 96 mmol/L — ABNORMAL LOW (ref 98–111)
Creatinine, Ser: 0.63 mg/dL (ref 0.44–1.00)
GFR, Estimated: >60 mL/min (ref >60)
Glucose, Bld: 100 mg/dL — ABNORMAL HIGH (ref 70–99)
Potassium: 3.1 mmol/L — ABNORMAL LOW (ref 3.5–5.1)
Sodium: 133 mmol/L — ABNORMAL LOW (ref 135–145)
Total Bilirubin: 0.9 mg/dL (ref 0.3–1.2)
Total Protein: 7.3 g/dL (ref 6.5–8.1)

## 2021-10-09 LAB — CBC WITH DIFFERENTIAL/PLATELET
Abs Immature Granulocytes: 0.01 10*3/uL (ref 0.00–0.07)
Basophils Absolute: 0 10*3/uL (ref 0.0–0.1)
Basophils Relative: 0 %
Eosinophils Absolute: 0 10*3/uL (ref 0.0–0.5)
Eosinophils Relative: 0 %
HCT: 38 % (ref 36.0–46.0)
Hemoglobin: 12.6 g/dL (ref 12.0–15.0)
Immature Granulocytes: 0 %
Lymphocytes Relative: 29 %
Lymphs Abs: 1 10*3/uL (ref 0.7–4.0)
MCH: 30.2 pg (ref 26.0–34.0)
MCHC: 33.2 g/dL (ref 30.0–36.0)
MCV: 91.1 fL (ref 80.0–100.0)
Monocytes Absolute: 0.3 10*3/uL (ref 0.1–1.0)
Monocytes Relative: 7 %
Neutro Abs: 2.3 10*3/uL (ref 1.7–7.7)
Neutrophils Relative %: 64 %
Platelets: 113 10*3/uL — ABNORMAL LOW (ref 150–400)
RBC: 4.17 MIL/uL (ref 3.87–5.11)
RDW: 16.5 % — ABNORMAL HIGH (ref 11.5–15.5)
WBC: 3.7 10*3/uL — ABNORMAL LOW (ref 4.0–10.5)
nRBC: 0 % (ref 0.0–0.2)

## 2021-10-09 LAB — URINALYSIS, ROUTINE W REFLEX MICROSCOPIC
Bilirubin Urine: NEGATIVE
Glucose, UA: NEGATIVE mg/dL
Hgb urine dipstick: NEGATIVE
Ketones, ur: NEGATIVE mg/dL
Leukocytes,Ua: NEGATIVE
Nitrite: NEGATIVE
Protein, ur: NEGATIVE mg/dL
Specific Gravity, Urine: 1.011 (ref 1.005–1.030)
pH: 8 (ref 5.0–8.0)

## 2021-10-09 LAB — LIPASE, BLOOD: Lipase: 159 U/L — ABNORMAL HIGH (ref 11–51)

## 2021-10-09 LAB — PREGNANCY, URINE: Preg Test, Ur: NEGATIVE

## 2021-10-09 LAB — ETHANOL: Alcohol, Ethyl (B): <10 mg/dL (ref <10)

## 2021-10-09 LAB — MAGNESIUM: Magnesium: 1.9 mg/dL (ref 1.7–2.4)

## 2021-10-09 LAB — I-STAT BETA HCG BLOOD, ED (MC, WL, AP ONLY): I-stat hCG, quantitative: 15.6 m[IU]/mL — ABNORMAL HIGH (ref <5)

## 2021-10-09 MED ORDER — THIAMINE HCL 100 MG PO TABS: 100.0000 mg | ORAL_TABLET | Freq: Every day | ORAL | Status: DC

## 2021-10-09 MED ORDER — THIAMINE HCL 100 MG/ML IJ SOLN: 100.0000 mg | Freq: Every day | INTRAMUSCULAR | Status: DC

## 2021-10-09 MED ORDER — LORAZEPAM 2 MG/ML IJ SOLN: 0.0000 mg | Freq: Four times a day (QID) | INTRAMUSCULAR | Status: DC

## 2021-10-09 MED ORDER — LORAZEPAM 2 MG/ML IJ SOLN: 0.0000 mg | Freq: Two times a day (BID) | INTRAMUSCULAR | Status: DC

## 2021-10-09 MED ORDER — POTASSIUM CHLORIDE CRYS ER 20 MEQ PO TBCR
40.0000 meq | EXTENDED_RELEASE_TABLET | Freq: Once | ORAL | Status: AC
  Administered 2021-10-09: 40 meq via ORAL
  Filled 2021-10-09: qty 2

## 2021-10-09 MED ORDER — LORAZEPAM 1 MG PO TABS: 0.0000 mg | ORAL_TABLET | Freq: Four times a day (QID) | ORAL | Status: DC

## 2021-10-09 MED ORDER — IOHEXOL 300 MG/ML  SOLN
100.0000 mL | Freq: Once | INTRAMUSCULAR | Status: AC | PRN
  Administered 2021-10-09: 100 mL via INTRAVENOUS

## 2021-10-09 MED ORDER — LORAZEPAM 1 MG PO TABS: 0.0000 mg | ORAL_TABLET | Freq: Two times a day (BID) | ORAL | Status: DC

## 2021-10-09 NOTE — ED Provider Notes (Signed)
?[facility] DEPT ?Provider Note ? ? ?CSN: [id number] ?Arrival date & time: 10/09/21  1548 ? ?  ? ?History ? ?Chief Complaint  ?Patient presents with  ? Hallucinations  ? ? ?Sheila Graves is a 41 y.o. female. ? ?Sheila Graves is a 41 y.o. female with a history of alcoholism, seizures, pancreatitis, hypothyroidism, who presents to the emergency department for evaluation of alcohol withdrawal.  Patient reports that she was seen for alcohol withdrawal in the ED on 4/11 and was actually admitted to the hospital for alcohol withdrawal.  She reports at that time she was having hallucinations and hallucinated that she had been discharged from the hospital in the left, and when she made at home she realized her IV was still in her arm and she had not been discharged.  She decided to stay home yesterday, but then today reportedly had 2 seizures at home.  Patient reports she was home by herself and no one else witnessed this activity and she was laying on her bed when it occurred.  She reports remembering "that her head fell back" but did not remember anything else.  Called EMS and when they arrived there was no seizure-like activity or postictal phase noted.  Patient reports she still having some hallucinations and tremors, but denies any vomiting, chest pain or palpitations.  No headaches, numbness or weakness.  Patient does report some upper abdominal pain, no blood in vomit or stool.  No other aggravating or alleviating factors. ? ?The history is provided by the patient and medical records.  ? ?  ? ?Home Medications ?Prior to Admission medications   ?Medication Sig Start Date End Date Taking? Authorizing Provider  ?aspirin-acetaminophen-caffeine (EXCEDRIN MIGRAINE) 250-250-65 MG tablet Take 2 tablets by mouth every 6 (six) hours as needed for headache or migraine.    [provider]  ?folic acid (FOLVITE) 1 MG tablet Take 1 tablet (1 mg total) by mouth daily. 08/28/21   Oswald Hillock, MD   ?Multiple Vitamin (MULTIVITAMIN) capsule Take 1 capsule by mouth daily.    [provider]  ?naltrexone (DEPADE) 50 MG tablet Take 1 tablet (50 mg total) by mouth daily. ?Patient not taking: Reported on 08/27/2021 02/24/21   Simmons-Robinson, Riki Sheer, MD  ?potassium chloride (KLOR-CON) 10 MEQ tablet Take 1 tablet (10 mEq total) by mouth 2 (two) times daily. 10/02/21   Palumbo, April, MD  ?potassium chloride SA (KLOR-CON M) 20 MEQ tablet Take 1 tablet (20 mEq total) by mouth once for 1 dose. 09/07/21 09/07/21  Horton, Alvin Critchley, DO  ?thiamine 100 MG tablet Take 1 tablet (100 mg total) by mouth daily. 08/28/21   Oswald Hillock, MD  ?escitalopram (LEXAPRO) 10 MG tablet Take 1 tablet (10 mg total) by mouth daily. ?Patient not taking: Reported on 09/16/2019 03/22/19 11/16/19  Louellen Molder, MD  ?   ? ?Allergies    ?Patient has no known allergies.   ? ?Review of Systems   ?Review of Systems  ?Constitutional:  Negative for chills and fever.  ?HENT: Negative.    ?Eyes:  Negative for visual disturbance.  ?Respiratory:  Negative for shortness of breath.   ?Cardiovascular:  Negative for chest pain.  ?Gastrointestinal:  Negative for abdominal pain, nausea and vomiting.  ?Genitourinary:  Negative for dysuria and frequency.  ?Musculoskeletal:  Negative for arthralgias and myalgias.  ?Skin:  Negative for color change and rash.  ?Neurological:  Positive for tremors and seizures. Negative for dizziness, syncope, weakness, light-headedness, numbness and headaches.  ?  Psychiatric/Behavioral:  Positive for hallucinations. The patient is nervous/anxious.   ? ?Physical Exam ?Updated Vital Signs ?BP (!) 131/92 (BP Location: Left Arm)   Pulse 75   Temp 98.7 ?F (37.1 ?C) (Oral)   Resp 16   Ht 5\' 10"  (1.778 m)   Wt 63.5 kg   LMP 10/02/2021   SpO2 99%   BMI 20.09 kg/m?  ?Physical Exam ?Vitals and nursing note reviewed.  ?Constitutional:   ?   General: She is not in acute distress. ?   Appearance: Normal appearance. She is  well-developed and normal weight. She is not ill-appearing or diaphoretic.  ?HENT:  ?   Head: Normocephalic and atraumatic.  ?   Mouth/Throat:  ?   Mouth: Mucous membranes are moist.  ?   Pharynx: Oropharynx is clear.  ?Eyes:  ?   General:     ?   Right eye: No discharge.     ?   Left eye: No discharge.  ?   Extraocular Movements: Extraocular movements intact.  ?   Pupils: Pupils are equal, round, and reactive to light.  ?Cardiovascular:  ?   Rate and Rhythm: Normal rate and regular rhythm.  ?   Pulses: Normal pulses.  ?   Heart sounds: Normal heart sounds.  ?Pulmonary:  ?   Effort: Pulmonary effort is normal. No respiratory distress.  ?   Breath sounds: Normal breath sounds. No wheezing or rales.  ?   Comments: Respirations equal and unlabored, patient able to speak in full sentences, lungs clear to auscultation bilaterally  ?Abdominal:  ?   General: Bowel sounds are normal. There is no distension.  ?   Palpations: Abdomen is soft. There is no mass.  ?   Tenderness: There is no abdominal tenderness. There is no guarding.  ?   Comments: Abdomen soft, nondistended, nontender to palpation in all quadrants without guarding or peritoneal signs  ?Musculoskeletal:     ?   General: No deformity.  ?   Cervical back: Neck supple.  ?Skin: ?   General: Skin is warm and dry.  ?   Capillary Refill: Capillary refill takes less than 2 seconds.  ?Neurological:  ?   Mental Status: She is alert and oriented to person, place, and time.  ?   Coordination: Coordination normal.  ?   Comments: Speech is clear, able to follow commands ?CN III-XII intact ?Normal strength in upper and lower extremities bilaterally including dorsiflexion and plantar flexion, strong and equal grip strength ?Sensation normal to light and sharp touch ?Moves extremities without ataxia, coordination intact, no tremors noted  ?Psychiatric:     ?   Attention and Perception: Attention normal. She perceives auditory hallucinations. She does not perceive visual  hallucinations.     ?   Mood and Affect: Mood and affect normal.     ?   Speech: Speech normal.     ?   Behavior: Behavior normal. Behavior is cooperative.     ?   Thought Content: Thought content does not include homicidal or suicidal ideation.  ?   Comments: Reported hallucinations but patient is not actively responding to internal stimuli  ? ? ?ED Results / Procedures / Treatments   ?Labs ?(all labs ordered are listed, but only abnormal results are displayed) ?Labs Reviewed  ?CBC WITH DIFFERENTIAL/PLATELET - Abnormal; Notable for the following components:  ?    Result Value  ? WBC 3.7 (*)   ? RDW 16.5 (*)   ? Platelets 113 (*)   ?  All other components within normal limits  ?COMPREHENSIVE METABOLIC PANEL - Abnormal; Notable for the following components:  ? Sodium 133 (*)   ? Potassium 3.1 (*)   ? Chloride 96 (*)   ? Glucose, Bld 100 (*)   ? AST 72 (*)   ? ALT 46 (*)   ? All other components within normal limits  ?LIPASE, BLOOD - Abnormal; Notable for the following components:  ? Lipase 159 (*)   ? All other components within normal limits  ?I-STAT BETA HCG BLOOD, ED ([facility], [facility], [facility] ONLY) - Abnormal; Notable for the following components:  ? I-stat hCG, quantitative 15.6 (*)   ? All other components within normal limits  ?ETHANOL  ?MAGNESIUM  ?URINALYSIS, ROUTINE W REFLEX MICROSCOPIC  ?PREGNANCY, URINE  ? ? ?EKG ?None ? ?Radiology ?CT Head Wo Contrast ? ?Result Date: 10/10/2021 ?CLINICAL DATA:  Recent seizure activity EXAM: CT HEAD WITHOUT CONTRAST TECHNIQUE: Contiguous axial images were obtained from the base of the skull through the vertex without intravenous contrast. RADIATION DOSE REDUCTION: This exam was performed according to the departmental dose-optimization program which includes automated exposure control, adjustment of the mA and/or kV according to patient size and/or use of iterative reconstruction technique. COMPARISON:  02/22/2021 FINDINGS: Brain: No evidence of acute infarction, hemorrhage, hydrocephalus,  extra-axial collection or mass lesion/mass effect. Vascular: No hyperdense vessel or unexpected calcification. Skull: Normal. Negative for fracture or focal lesion. Sinuses/Orbits: No acute finding. Other: None. IMPRESSION: No acu

## 2021-10-09 NOTE — ED Provider Triage Note (Signed)
Emergency Medicine Provider Triage Evaluation Note ? ?Sheila Graves , a 41 y.o. female  was evaluated in triage.  Pt complains of hallucinations.  Patient was seen and admitted on 411 for alcohol withdrawal.  At that time she had an AKI, emesis and seizures.  She states she hallucinated being discharged in the hospital and did not leave AMA.  Has not drank alcohol in the last 3 to 4 days, she is having hallucinations where she thinks there is somebody in room talking at her.  She was seizures earlier today per patient. ? ?Review of Systems  ?Per HPI ? ?Physical Exam  ?BP (!) 124/99   Pulse 77   Temp 98.5 ?F (36.9 ?C) (Oral)   Resp 18   LMP 10/02/2021   SpO2 100%  ?Gen:   Awake, no distress   ?Resp:  Normal effort  ?MSK:   Moves extremities without difficulty  ?Other:  Not actively in withdrawal, no tremors, no asterixis ? ?Medical Decision Making  ?Medically screening exam initiated at 4:31 PM.  Appropriate orders placed.  Sheila Graves was informed that the remainder of the evaluation will be completed by another provider, this initial triage assessment does not replace that evaluation, and the importance of remaining in the ED until their evaluation is complete. ? ? ?  ?Theron Arista, PA-C ?10/09/21 1632 ? ?

## 2021-10-09 NOTE — ED Triage Notes (Signed)
Pt presents with c/o hallucinations related to possible alcohol withdrawal. Pt reports that she normally drinks every day, one to 2 bottles of vodka a day, has not had a drink in 4 days. Pt reports she went to [facility] yesterday and hallucinated that she got discharged, made it home only to realize her IV was still in her arm and she had not been discharged. Pt reporting some seizure activity at home, no seizure activity noted for EMS. Pt alert and oriented for EMS. ?

## 2021-10-10 ENCOUNTER — Other Ambulatory Visit: Payer: Self-pay

## 2021-10-10 ENCOUNTER — Encounter (HOSPITAL_COMMUNITY): Payer: Self-pay | Admitting: Emergency Medicine

## 2021-10-10 ENCOUNTER — Emergency Department (HOSPITAL_COMMUNITY): Payer: Self-pay

## 2021-10-10 MED ORDER — SODIUM CHLORIDE 0.9 % IV BOLUS
1000.0000 mL | Freq: Once | INTRAVENOUS | Status: AC
Start: 2021-10-10 — End: 2021-10-10
  Administered 2021-10-10: 1000 mL via INTRAVENOUS

## 2021-10-10 MED ORDER — ONDANSETRON HCL 4 MG/2ML IJ SOLN
4.0000 mg | Freq: Once | INTRAMUSCULAR | Status: AC
  Administered 2021-10-10: 4 mg via INTRAVENOUS
  Filled 2021-10-10: qty 2

## 2021-10-10 MED ORDER — MORPHINE SULFATE (PF) 4 MG/ML IV SOLN
4.0000 mg | Freq: Once | INTRAVENOUS | Status: AC
  Administered 2021-10-10: 4 mg via INTRAVENOUS
  Filled 2021-10-10: qty 1

## 2021-10-10 NOTE — Discharge Instructions (Addendum)
You have been observed in the ED for several hours today and I am reassured that you have not had any additional seizures and your withdrawal symptoms seem to be improving.  Your abdominal pain is likely due to alcoholic gastritis and very mild pancreatitis.  Continue to avoid alcohol as this will worsen the symptoms.  You can use Tums and/or Pepcid to help with stomach irritation.  Avoid NSAIDs or spicy or acidic foods as this may worsen your symptoms.  Follow-up with your primary care doctor.  Return to the emergency department if you have any new or worsening symptoms such as seizures, worsening tremors, persistent and worsening hallucinations, vomiting or other new or concerning symptoms. ?

## 2021-11-11 ENCOUNTER — Encounter (HOSPITAL_BASED_OUTPATIENT_CLINIC_OR_DEPARTMENT_OTHER): Payer: Self-pay

## 2021-11-11 ENCOUNTER — Other Ambulatory Visit: Payer: Self-pay

## 2021-11-11 ENCOUNTER — Inpatient Hospital Stay (HOSPITAL_BASED_OUTPATIENT_CLINIC_OR_DEPARTMENT_OTHER)
Admission: EM | Admit: 2021-11-11 | Discharge: 2021-11-13 | DRG: 641 | Disposition: A | Discharge status: Home / Self Care | Payer: Self-pay | Attending: Internal Medicine | Admitting: Internal Medicine

## 2021-11-11 ENCOUNTER — Observation Stay (HOSPITAL_COMMUNITY): Payer: Self-pay

## 2021-11-11 DIAGNOSIS — R946 Abnormal results of thyroid function studies: Secondary | ICD-10-CM | POA: Diagnosis present

## 2021-11-11 DIAGNOSIS — E871 Hypo-osmolality and hyponatremia: Secondary | ICD-10-CM | POA: Diagnosis present

## 2021-11-11 DIAGNOSIS — D509 Iron deficiency anemia, unspecified: Secondary | ICD-10-CM | POA: Diagnosis present

## 2021-11-11 DIAGNOSIS — E876 Hypokalemia: Principal | ICD-10-CM | POA: Diagnosis present

## 2021-11-11 DIAGNOSIS — R7989 Other specified abnormal findings of blood chemistry: Secondary | ICD-10-CM | POA: Diagnosis present

## 2021-11-11 DIAGNOSIS — R9431 Abnormal electrocardiogram [ECG] [EKG]: Secondary | ICD-10-CM | POA: Diagnosis present

## 2021-11-11 DIAGNOSIS — D72818 Other decreased white blood cell count: Secondary | ICD-10-CM | POA: Diagnosis present

## 2021-11-11 DIAGNOSIS — E878 Other disorders of electrolyte and fluid balance, not elsewhere classified: Secondary | ICD-10-CM | POA: Diagnosis present

## 2021-11-11 DIAGNOSIS — Y900 Blood alcohol level of less than 20 mg/100 ml: Secondary | ICD-10-CM | POA: Diagnosis present

## 2021-11-11 DIAGNOSIS — Z87891 Personal history of nicotine dependence: Secondary | ICD-10-CM

## 2021-11-11 DIAGNOSIS — F1020 Alcohol dependence, uncomplicated: Secondary | ICD-10-CM | POA: Diagnosis present

## 2021-11-11 DIAGNOSIS — R748 Abnormal levels of other serum enzymes: Secondary | ICD-10-CM | POA: Diagnosis present

## 2021-11-11 DIAGNOSIS — E440 Moderate protein-calorie malnutrition: Secondary | ICD-10-CM | POA: Diagnosis present

## 2021-11-11 DIAGNOSIS — Z681 Body mass index (BMI) 19 or less, adult: Secondary | ICD-10-CM

## 2021-11-11 DIAGNOSIS — R1110 Vomiting, unspecified: Secondary | ICD-10-CM | POA: Diagnosis present

## 2021-11-11 DIAGNOSIS — Z79899 Other long term (current) drug therapy: Secondary | ICD-10-CM

## 2021-11-11 DIAGNOSIS — R252 Cramp and spasm: Secondary | ICD-10-CM | POA: Diagnosis present

## 2021-11-11 LAB — CBC WITH DIFFERENTIAL/PLATELET
Abs Immature Granulocytes: 0.01 10*3/uL (ref 0.00–0.07)
Basophils Absolute: 0 10*3/uL (ref 0.0–0.1)
Basophils Relative: 0 %
Eosinophils Absolute: 0 10*3/uL (ref 0.0–0.5)
Eosinophils Relative: 0 %
HCT: 32.8 % — ABNORMAL LOW (ref 36.0–46.0)
Hemoglobin: 11.7 g/dL — ABNORMAL LOW (ref 12.0–15.0)
Immature Granulocytes: 0 %
Lymphocytes Relative: 36 %
Lymphs Abs: 1 10*3/uL (ref 0.7–4.0)
MCH: 31.4 pg (ref 26.0–34.0)
MCHC: 35.7 g/dL (ref 30.0–36.0)
MCV: 87.9 fL (ref 80.0–100.0)
Monocytes Absolute: 0.5 10*3/uL (ref 0.1–1.0)
Monocytes Relative: 17 %
Neutro Abs: 1.3 10*3/uL — ABNORMAL LOW (ref 1.7–7.7)
Neutrophils Relative %: 47 %
Platelets: 183 10*3/uL (ref 150–400)
RBC: 3.73 MIL/uL — ABNORMAL LOW (ref 3.87–5.11)
RDW: 15.4 % (ref 11.5–15.5)
WBC: 2.9 10*3/uL — ABNORMAL LOW (ref 4.0–10.5)
nRBC: 0 % (ref 0.0–0.2)

## 2021-11-11 LAB — COMPREHENSIVE METABOLIC PANEL
ALT: 31 U/L (ref 0–44)
AST: 66 U/L — ABNORMAL HIGH (ref 15–41)
Albumin: 4.9 g/dL (ref 3.5–5.0)
Alkaline Phosphatase: 108 U/L (ref 38–126)
Anion gap: 24 — ABNORMAL HIGH (ref 5–15)
BUN: 10 mg/dL (ref 6–20)
CO2: 30 mmol/L (ref 22–32)
Calcium: 9.4 mg/dL (ref 8.9–10.3)
Chloride: 72 mmol/L — ABNORMAL LOW (ref 98–111)
Creatinine, Ser: 0.79 mg/dL (ref 0.44–1.00)
GFR, Estimated: >60 mL/min (ref >60)
Glucose, Bld: 144 mg/dL — ABNORMAL HIGH (ref 70–99)
Potassium: 2.5 mmol/L — CL (ref 3.5–5.1)
Sodium: 126 mmol/L — ABNORMAL LOW (ref 135–145)
Total Bilirubin: 2.7 mg/dL — ABNORMAL HIGH (ref 0.3–1.2)
Total Protein: 8 g/dL (ref 6.5–8.1)

## 2021-11-11 LAB — RAPID URINE DRUG SCREEN, HOSP PERFORMED
Amphetamines: NOT DETECTED
Barbiturates: NOT DETECTED
Benzodiazepines: NOT DETECTED
Cocaine: NOT DETECTED
Opiates: NOT DETECTED
Tetrahydrocannabinol: NOT DETECTED

## 2021-11-11 LAB — MAGNESIUM
Magnesium: 1.8 mg/dL (ref 1.7–2.4)
Magnesium: 2 mg/dL (ref 1.7–2.4)

## 2021-11-11 LAB — CBC
HCT: 34.8 % — ABNORMAL LOW (ref 36.0–46.0)
Hemoglobin: 12.2 g/dL (ref 12.0–15.0)
MCH: 29.9 pg (ref 26.0–34.0)
MCHC: 35.1 g/dL (ref 30.0–36.0)
MCV: 85.3 fL (ref 80.0–100.0)
Platelets: 201 10*3/uL (ref 150–400)
RBC: 4.08 MIL/uL (ref 3.87–5.11)
RDW: 15.3 % (ref 11.5–15.5)
WBC: 3.2 10*3/uL — ABNORMAL LOW (ref 4.0–10.5)
nRBC: 0 % (ref 0.0–0.2)

## 2021-11-11 LAB — BASIC METABOLIC PANEL
Anion gap: 12 (ref 5–15)
BUN: 8 mg/dL (ref 6–20)
CO2: 32 mmol/L (ref 22–32)
Calcium: 9.2 mg/dL (ref 8.9–10.3)
Chloride: 84 mmol/L — ABNORMAL LOW (ref 98–111)
Creatinine, Ser: 0.87 mg/dL (ref 0.44–1.00)
GFR, Estimated: >60 mL/min (ref >60)
Glucose, Bld: 102 mg/dL — ABNORMAL HIGH (ref 70–99)
Potassium: 3.1 mmol/L — ABNORMAL LOW (ref 3.5–5.1)
Sodium: 128 mmol/L — ABNORMAL LOW (ref 135–145)

## 2021-11-11 LAB — BLOOD GAS, VENOUS
Acid-Base Excess: 12.1 mmol/L — ABNORMAL HIGH (ref 0.0–2.0)
Bicarbonate: 36.7 mmol/L — ABNORMAL HIGH (ref 20.0–28.0)
Drawn by: 25475
O2 Saturation: 44.9 %
Patient temperature: 36.8
pCO2, Ven: 46 mmHg (ref 44–60)
pH, Ven: 7.51 — ABNORMAL HIGH (ref 7.25–7.43)
pO2, Ven: <31 mmHg — CL (ref 32–45)

## 2021-11-11 LAB — PHOSPHORUS: Phosphorus: 1.1 mg/dL — ABNORMAL LOW (ref 2.5–4.6)

## 2021-11-11 LAB — LIPASE, BLOOD: Lipase: 24 U/L (ref 11–51)

## 2021-11-11 LAB — PROTIME-INR
INR: 0.9 (ref 0.8–1.2)
Prothrombin Time: 12.5 seconds (ref 11.4–15.2)

## 2021-11-11 LAB — TSH: TSH: 20.434 u[IU]/mL — ABNORMAL HIGH (ref 0.350–4.500)

## 2021-11-11 LAB — PREGNANCY, URINE: Preg Test, Ur: NEGATIVE

## 2021-11-11 LAB — AMMONIA: Ammonia: 21 umol/L (ref 9–35)

## 2021-11-11 LAB — OSMOLALITY: Osmolality: 264 mOsm/kg — ABNORMAL LOW (ref 275–295)

## 2021-11-11 LAB — ETHANOL: Alcohol, Ethyl (B): 12 mg/dL — ABNORMAL HIGH (ref <10)

## 2021-11-11 LAB — CK: Total CK: 534 U/L — ABNORMAL HIGH (ref 38–234)

## 2021-11-11 MED ORDER — ADULT MULTIVITAMIN W/MINERALS CH
1.0000 | ORAL_TABLET | Freq: Every day | ORAL | Status: DC
  Administered 2021-11-11 – 2021-11-13 (×3): 1 via ORAL
  Filled 2021-11-11 (×3): qty 1

## 2021-11-11 MED ORDER — LORAZEPAM 2 MG/ML IJ SOLN: 1.0000 mg | INTRAMUSCULAR | Status: DC | PRN

## 2021-11-11 MED ORDER — HYDROCODONE-ACETAMINOPHEN 5-325 MG PO TABS: 1.0000 | ORAL_TABLET | ORAL | Status: DC | PRN

## 2021-11-11 MED ORDER — FOLIC ACID 1 MG PO TABS
1.0000 mg | ORAL_TABLET | Freq: Every day | ORAL | Status: DC
  Administered 2021-11-11 – 2021-11-13 (×3): 1 mg via ORAL
  Filled 2021-11-11 (×3): qty 1

## 2021-11-11 MED ORDER — POTASSIUM PHOSPHATES 15 MMOLE/5ML IV SOLN
15.0000 mmol | Freq: Once | INTRAVENOUS | Status: AC
  Administered 2021-11-11: 15 mmol via INTRAVENOUS
  Filled 2021-11-11: qty 5

## 2021-11-11 MED ORDER — THIAMINE HCL 100 MG PO TABS
100.0000 mg | ORAL_TABLET | Freq: Every day | ORAL | Status: DC
  Administered 2021-11-12 – 2021-11-13 (×2): 100 mg via ORAL
  Filled 2021-11-11 (×3): qty 1

## 2021-11-11 MED ORDER — POTASSIUM CHLORIDE 10 MEQ/100ML IV SOLN
10.0000 meq | INTRAVENOUS | Status: AC
  Administered 2021-11-11 – 2021-11-12 (×4): 10 meq via INTRAVENOUS
  Filled 2021-11-11 (×4): qty 100

## 2021-11-11 MED ORDER — POTASSIUM CHLORIDE CRYS ER 20 MEQ PO TBCR
40.0000 meq | EXTENDED_RELEASE_TABLET | ORAL | Status: AC
  Administered 2021-11-11 (×2): 40 meq via ORAL
  Filled 2021-11-11 (×2): qty 2

## 2021-11-11 MED ORDER — ACETAMINOPHEN 650 MG RE SUPP: 650.0000 mg | Freq: Four times a day (QID) | RECTAL | Status: DC | PRN

## 2021-11-11 MED ORDER — POTASSIUM CHLORIDE 10 MEQ/100ML IV SOLN
10.0000 meq | INTRAVENOUS | Status: AC
  Administered 2021-11-11 (×3): 10 meq via INTRAVENOUS
  Filled 2021-11-11 (×3): qty 100

## 2021-11-11 MED ORDER — SODIUM CHLORIDE 0.9 % IV BOLUS
1000.0000 mL | Freq: Once | INTRAVENOUS | Status: AC
  Administered 2021-11-11: 1000 mL via INTRAVENOUS

## 2021-11-11 MED ORDER — LORAZEPAM 1 MG PO TABS: 1.0000 mg | ORAL_TABLET | ORAL | Status: DC | PRN

## 2021-11-11 MED ORDER — MELATONIN 3 MG PO TABS
3.0000 mg | ORAL_TABLET | Freq: Every evening | ORAL | Status: DC | PRN
  Administered 2021-11-11: 3 mg via ORAL
  Filled 2021-11-11: qty 1

## 2021-11-11 MED ORDER — SODIUM CHLORIDE 0.9 % IV SOLN: INTRAVENOUS | Status: DC | PRN

## 2021-11-11 MED ORDER — POTASSIUM CHLORIDE ER 10 MEQ PO TBCR
10.0000 meq | EXTENDED_RELEASE_TABLET | Freq: Two times a day (BID) | ORAL | Status: DC
  Administered 2021-11-12 – 2021-11-13 (×3): 10 meq via ORAL
  Filled 2021-11-11 (×6): qty 1

## 2021-11-11 MED ORDER — THIAMINE HCL 100 MG/ML IJ SOLN
100.0000 mg | Freq: Every day | INTRAMUSCULAR | Status: DC
  Administered 2021-11-11: 100 mg via INTRAVENOUS
  Filled 2021-11-11: qty 2

## 2021-11-11 MED ORDER — ACETAMINOPHEN 325 MG PO TABS: 650.0000 mg | ORAL_TABLET | Freq: Four times a day (QID) | ORAL | Status: DC | PRN

## 2021-11-11 NOTE — ED Notes (Signed)
Patient updated on plan of care and informed no bed available at this time. Patient denies needing anything and reports she is okay.  ?

## 2021-11-11 NOTE — Subjective & Objective (Signed)
Patient presented today for cramping hands she states that she stopped drinking alcohol and usually when that happens she needs a potassium drip ?Patient has known history of depression and alcohol abuse with prior history of DTs with seizures.  She has been binge drinking recently.  At that time she drinks about a liter of vodka.  Her last alcohol drink was about 2 days ago prior to this she was drinking for 3 days straight. ?In the emergency department she had was having mild tachycardia ?Labs were significant for sodium 126 and potassium of 2.5 may have elevated T. bili at 2.7 alcohol level was down to 12 ?No hematemesis ?Currently going through divorce ?She was receiving treatment for alcohol abuse 2 weeks ago no associated chest pain nausea vomiting ? ?

## 2021-11-11 NOTE — ED Notes (Signed)
CRITICAL VALUE STICKER ? ?CRITICAL VALUE: K+ 2.5 ? ?RECEIVER (on-site recipient of call): ? ?DATE & TIME NOTIFIED: 1358 11/11/21 ? ?MD NOTIFIED: Jeanell Sparrow, MD ? ?TIME OF NOTIFICATION:1358 11/11/21 ? ?RESPONSE: See orders ? ?

## 2021-11-11 NOTE — Assessment & Plan Note (Addendum)
Replete electrolytes continue to monitor on telemetry repeat EKG in the morning ?Hold QT prolonging medications ?

## 2021-11-11 NOTE — ED Triage Notes (Signed)
Per EMS: ?Patient is coming from home ?Patient reports to the ER for cramping hands that she states is related to her pausing her binge drinking and her need for a potassium drip.  ?

## 2021-11-11 NOTE — ED Provider Notes (Signed)
?[facility] EMERGENCY DEPT ?Provider Note ? ? ?CSN: [id number] ?Arrival date & time: 11/11/21  1253 ? ?  ? ?History ?Chief Complaint  ?Patient presents with  ? Hand Pain  ? ? ?Sheila Graves is a 41 y.o. female with history of hypokalemia, alcohol use disorder for evaluation of her bilateral hands cramping.  She reports that she usually has hand cramping after binging which usually means she has low potassium.  She presents to have her electrolytes checked.  For the past 3 days, she is drink almost a liter of vodka per day.  She reports she has been in alcohol user for the past 13 years.  She reports she started to have some vomiting yesterday between 5-10 episodes.  She reports she does not think she is drinking because she does not wanted to "ruin her buzz".  She denies any hematemesis, coffee-ground emesis, or bilious emesis.  She denies any auditory or visual hallucinations.  Her last drink was 2 days ago in the afternoon.  She does report a history of DTs with seizures, but denies any having any seizures or any currently or over the past few days.  She reports she has had some recent stressors because she is going through divorce.  She is receiving treatment for her alcohol use disorder in 2 weeks.  She denies any nausea, abdominal pain, chest pain, or shortness of breath. ? ? ?Hand Pain ?Pertinent negatives include no chest pain, no abdominal pain, no headaches and no shortness of breath.  ? ?  ? ?Home Medications ?Prior to Admission medications   ?Medication Sig Start Date End Date Taking? Authorizing Provider  ?Multiple Vitamin (MULTIVITAMIN) capsule Take 1 capsule by mouth daily.   Yes [provider]  ?potassium chloride (KLOR-CON) 10 MEQ tablet Take 1 tablet (10 mEq total) by mouth 2 (two) times daily. 10/02/21  Yes Palumbo, April, MD  ?aspirin-acetaminophen-caffeine (EXCEDRIN MIGRAINE) [phone] MG tablet Take 2 tablets by mouth every 6 (six) hours as needed for headache or migraine.     [provider]  ?folic acid (FOLVITE) 1 MG tablet Take 1 tablet (1 mg total) by mouth daily. 08/28/21   Meredeth IdeLama, Gagan S, MD  ?naltrexone (DEPADE) 50 MG tablet Take 1 tablet (50 mg total) by mouth daily. ?Patient not taking: Reported on 08/27/2021 02/24/21   Simmons-Robinson, Tawanna CoolerMakiera, MD  ?potassium chloride SA (KLOR-CON M) 20 MEQ tablet Take 1 tablet (20 mEq total) by mouth once for 1 dose. 09/07/21 09/07/21  Horton, Clabe SealKristie M, DO  ?thiamine 100 MG tablet Take 1 tablet (100 mg total) by mouth daily. ?Patient not taking: Reported on 11/11/2021 08/28/21   Meredeth IdeLama, Gagan S, MD  ?escitalopram (LEXAPRO) 10 MG tablet Take 1 tablet (10 mg total) by mouth daily. ?Patient not taking: Reported on 09/16/2019 03/22/19 11/16/19  Eddie Northhungel, Nishant, MD  ?   ? ?Allergies    ?Patient has no known allergies.   ? ?Review of Systems   ?Review of Systems  ?Constitutional:  Negative for chills and fever.  ?Respiratory:  Negative for shortness of breath.   ?Cardiovascular:  Negative for chest pain.  ?Gastrointestinal:  Positive for nausea and vomiting. Negative for abdominal pain, constipation and diarrhea.  ?Genitourinary:  Negative for dysuria and hematuria.  ?Musculoskeletal:  Positive for myalgias.  ?Neurological:  Negative for seizures, weakness and headaches.  ?Psychiatric/Behavioral:  Negative for hallucinations.   ? ?Physical Exam ?Updated Vital Signs ?BP 98/66   Pulse 79   Temp 98.2 ?F (36.8 ?C)  Resp 14   Ht 5\' 10"  (1.778 m)   Wt 63.5 kg   SpO2 100%   BMI 20.09 kg/m?  ?Physical Exam ?Vitals and nursing note reviewed.  ?Constitutional:   ?   General: She is not in acute distress. ?   Appearance: Normal appearance. She is not ill-appearing or toxic-appearing.  ?HENT:  ?   Head: Normocephalic and atraumatic.  ?   Mouth/Throat:  ?   Mouth: Mucous membranes are moist.  ?   Comments: Poor dentition ?Eyes:  ?   General: No scleral icterus. ?Cardiovascular:  ?   Rate and Rhythm: Regular rhythm. Tachycardia present.  ?Pulmonary:  ?    Effort: Pulmonary effort is normal. No respiratory distress.  ?   Breath sounds: Normal breath sounds.  ?Abdominal:  ?   General: Bowel sounds are normal.  ?   Palpations: Abdomen is soft.  ?   Tenderness: There is no abdominal tenderness. There is no guarding or rebound.  ?   Comments: Abdomen soft, normal active bowel sounds.  She reports that her abdomen has some discomfort, but she denies any pain.  No pain on palpation or any guarding or rebound.  ?Musculoskeletal:     ?   General: No deformity.  ?   Cervical back: Normal range of motion.  ?   Comments: Radial pulses intact.  Good cap refill.  No obvious deformity or abnormality noted to the bilateral hands.  ?Skin: ?   General: Skin is warm and dry.  ?Neurological:  ?   General: No focal deficit present.  ?   Mental Status: She is alert and oriented to person, place, and time. Mental status is at baseline.  ?   Sensory: No sensory deficit.  ?   Motor: No weakness.  ?   Comments: No tremors noted.  ? ? ?ED Results / Procedures / Treatments   ?Labs ?(all labs ordered are listed, but only abnormal results are displayed) ?Labs Reviewed  ?COMPREHENSIVE METABOLIC PANEL - Abnormal; Notable for the following components:  ?    Result Value  ? Sodium 126 (*)   ? Potassium 2.5 (*)   ? Chloride 72 (*)   ? Glucose, Bld 144 (*)   ? AST 66 (*)   ? Total Bilirubin 2.7 (*)   ? Anion gap 24 (*)   ? All other components within normal limits  ?ETHANOL - Abnormal; Notable for the following components:  ? Alcohol, Ethyl (B) 12 (*)   ? All other components within normal limits  ?CBC - Abnormal; Notable for the following components:  ? WBC 3.2 (*)   ? HCT 34.8 (*)   ? All other components within normal limits  ?RAPID URINE DRUG SCREEN, HOSP PERFORMED  ?PREGNANCY, URINE  ?MAGNESIUM  ?LIPASE, BLOOD  ? ? ?EKG ?EKG Interpretation ? ?Date/Time:  Tuesday Nov 11 2021 14:32:44 EDT ?Ventricular Rate:  95 ?PR Interval:  121 ?QRS Duration: 102 ?QT Interval:  483 ?QTC Calculation: 608 ?R  Axis:   80 ?Text Interpretation: Sinus rhythm Prolonged QT interval Confirmed by 02-22-1974 [id number]) on 11/11/2021 6:26:20 PM ? ?Radiology ?No results found. ? ?Procedures ?Procedures  ? ?Medications Ordered in ED ?Medications  ?LORazepam (ATIVAN) tablet 1-4 mg (has no administration in time range)  ?  Or  ?LORazepam (ATIVAN) injection 1-4 mg (has no administration in time range)  ?thiamine tablet 100 mg ( Oral See Alternative 11/11/21 1507)  ?  Or  ?thiamine (B-1) injection 100 mg (100 mg  Intravenous Given 11/11/21 1507)  ?folic acid (FOLVITE) tablet 1 mg (1 mg Oral Given 11/11/21 1508)  ?multivitamin with minerals tablet 1 tablet (1 tablet Oral Given 11/11/21 1508)  ?potassium chloride SA (KLOR-CON M) CR tablet 40 mEq (40 mEq Oral Given 11/11/21 1621)  ?potassium chloride 10 mEq in 100 mL IVPB (0 mEq Intravenous Stopped 11/11/21 1820)  ?sodium chloride 0.9 % bolus 1,000 mL (0 mLs Intravenous Stopped 11/11/21 1922)  ? ? ?ED Course/ Medical Decision Making/ A&P ?  ?                        ?Medical Decision Making ?Amount and/or Complexity of Data Reviewed ?Labs: ordered. ? ?Risk ?OTC drugs. ?Prescription drug management. ?Decision regarding hospitalization. ? ? ?41 year old female with history of alcohol use disorder and hypokalemia presents the emergency department for evaluation of her possible low potassium.  Differential diagnosis includes but is not limited to alcohol intoxication, electrolyte abnormality.  Vital signs show mildly elevated heart rate, otherwise normotensive, afebrile, satting well on room air without any increased work of breathing.  Physical exam is pertinent for well-appearing female.  No tremors noted.  She does have poor dentition.  There is no tenderness upon palpation of her abdomen or any guarding or rebound.  Soft with normal active bowel sounds.  She is mildly tachycardic, otherwise regular rhythm.  Lungs are clear to auscultation bilaterally.  She has no focal deficit.  Oriented x3.  Hands  appear normal.  Cap refill intact.  Radial pulses. ? ?I independently reviewed and interpreted the patient's labs.  Ethanol still elevated at 12.  CBC shows leukopenia at 3.2 and mildly decreased hematocrit at 34.8,

## 2021-11-11 NOTE — Assessment & Plan Note (Signed)
Will repeat in am rehydrate ?

## 2021-11-11 NOTE — Assessment & Plan Note (Signed)
Order CIWA protocol.  Monitor carefully for possible DTs discussed importance of quitting alcohol abuse ?

## 2021-11-11 NOTE — H&P (Addendum)
? ? ?Sheila Graves [id number] DOB: 29-Apr-1981 DOA: 11/11/2021 ? ? ?  ?PCP: Pcp, No   ?Outpatient Specialists:  ? ?Patient arrived to ER on 11/11/21 at 1253 ?Referred by Attending Therisa Doyneoutova, Jamyrah Saur, MD ? ? ?Patient coming from:   ? home Lives alone,    ? ?Chief Complaint:   ?Chief Complaint  ?Patient presents with  ? Hand Pain  ? ? ?HPI: ?Sheila Graves is a 41 y.o. female with medical history significant of depression, alcohol abuse hypokalemia QT prolongation ?  ? ?Presented with   cramping ?Patient presented today for cramping hands she states that she stopped drinking alcohol and usually when that happens she needs a potassium drip ?Patient has known history of depression and alcohol abuse with prior history of DTs with seizures.  She has been binge drinking recently.  At that time she drinks about a liter of vodka.  Her last alcohol drink was about 2 days ago prior to this she was drinking for 3 days straight. ?In the emergency department she had was having mild tachycardia ?Labs were significant for sodium 126 and potassium of 2.5 may have elevated T. bili at 2.7 alcohol level was down to 12 ?No hematemesis ?Currently going through divorce ?She was receiving treatment for alcohol abuse 2 weeks ago no associated chest pain nausea vomiting ? Now doing better ?She stopped taking potasium at home ? Reports gets withdraws next day ?not shaking  now ?Had 2 seizures due to ETOH withdraw last 3 years ago ?  ?   ?Regarding pertinent Chronic problems:   ?  ?While in ER: ?  ?Found to have hypokalemia and QT prolongation ? ?Hypokalemia started to be repleted ? ?Also noted to be hyponatremic ?  ? ?CXR -  NON acute  ?Following Medications were ordered in ER: ?Medications  ?LORazepam (ATIVAN) tablet 1-4 mg (has no administration in time range)  ?  Or  ?LORazepam (ATIVAN) injection 1-4 mg (has no administration in time range)  ?thiamine tablet 100 mg ( Oral See Alternative 11/11/21 1507)  ?  Or  ?thiamine (B-1) injection 100 mg  (100 mg Intravenous Given 11/11/21 1507)  ?folic acid (FOLVITE) tablet 1 mg (1 mg Oral Given 11/11/21 1508)  ?multivitamin with minerals tablet 1 tablet (1 tablet Oral Given 11/11/21 1508)  ?potassium chloride SA (KLOR-CON M) CR tablet 40 mEq (40 mEq Oral Given 11/11/21 1621)  ?potassium chloride 10 mEq in 100 mL IVPB (0 mEq Intravenous Stopped 11/11/21 1820)  ?sodium chloride 0.9 % bolus 1,000 mL (0 mLs Intravenous Stopped 11/11/21 1922)  ?   ?  ?ED Triage Vitals  ?Enc Vitals Group  ?   BP 11/11/21 1308 108/89  ?   Pulse Rate 11/11/21 1308 (!) 117  ?   Resp 11/11/21 1308 18  ?   Temp 11/11/21 1308 98.5 ?F (36.9 ?C)  ?   Temp Source 11/11/21 1308 Oral  ?   SpO2 11/11/21 1308 99 %  ?   Weight 11/11/21 1306 140 lb (63.5 kg)  ?   Height 11/11/21 1306 5\' 10"  (1.778 m)  ?   Head Circumference --   ?   Peak Flow --   ?   Pain Score 11/11/21 1306 7  ?   Pain Loc --   ?   Pain Edu? --   ?   Excl. in GC? --   ?WJXB(14)@TMAX(24)@    ? _________________________________________ ?Significant initial  Findings: ?Abnormal Labs Reviewed  ?COMPREHENSIVE METABOLIC PANEL - Abnormal; Notable for  the following components:  ?    Result Value  ? Sodium 126 (*)   ? Potassium 2.5 (*)   ? Chloride 72 (*)   ? Glucose, Bld 144 (*)   ? AST 66 (*)   ? Total Bilirubin 2.7 (*)   ? Anion gap 24 (*)   ? All other components within normal limits  ?ETHANOL - Abnormal; Notable for the following components:  ? Alcohol, Ethyl (B) 12 (*)   ? All other components within normal limits  ?CBC - Abnormal; Notable for the following components:  ? WBC 3.2 (*)   ? HCT 34.8 (*)   ? All other components within normal limits  ?BASIC METABOLIC PANEL - Abnormal; Notable for the following components:  ? Sodium 128 (*)   ? Potassium 3.1 (*)   ? Chloride 84 (*)   ? Glucose, Bld 102 (*)   ? All other components within normal limits  ?PHOSPHORUS - Abnormal; Notable for the following components:  ? Phosphorus 1.1 (*)   ? All other components within normal limits  ?CK - Abnormal; Notable  for the following components:  ? Total CK 534 (*)   ? All other components within normal limits  ?CBC WITH DIFFERENTIAL/PLATELET - Abnormal; Notable for the following components:  ? WBC 2.9 (*)   ? RBC 3.73 (*)   ? Hemoglobin 11.7 (*)   ? HCT 32.8 (*)   ? Neutro Abs 1.3 (*)   ? All other components within normal limits  ?BLOOD GAS, VENOUS - Abnormal; Notable for the following components:  ? pH, Ven 7.51 (*)   ? pO2, Ven <31 (*)   ? Bicarbonate 36.7 (*)   ? Acid-Base Excess 12.1 (*)   ? All other components within normal limits  ?TSH - Abnormal; Notable for the following components:  ? TSH 20.434 (*)   ? All other components within normal limits  ? ?   ?_________________________ ?  ?ECG: Ordered ?Personally reviewed by me showing: ?HR : 95 ?Rhythm: Sinus rhythm ?Prolonged QT interval ?QTC 608 ? ? ?Repeat ?HR 75 ?Normal sinus rhythm ?Prolonged QT ?Abnormal ECG ? QRS 543 ? ?The recent clinical data is shown below. ?Vitals:  ? 11/11/21 1830 11/11/21 1900 11/11/21 1941 11/11/21 1943  ?BP: 108/83 98/66 97/84  119/81  ?Pulse: 86 79 83 75  ?Resp: 19 14 19    ?Temp:  98.2 ?F (36.8 ?C) 98.2 ?F (36.8 ?C)   ?TempSrc:   Oral   ?SpO2: 100% 100% 100%   ?Weight:      ?Height:      ?  ?  ?WBC ? ?   ?Component Value Date/Time  ? WBC 3.2 (L) 11/11/2021 1317  ? LYMPHSABS 1.0 10/09/2021 1658  ? MONOABS 0.3 10/09/2021 1658  ? EOSABS 0.0 10/09/2021 1658  ? BASOSABS 0.0 10/09/2021 1658  ? ?  ? UA    ordered ?    ?C Difficile Quick Screen w PCR reflex     Status: None  ? Collection Time: 08/27/21  7:00 PM  ? Specimen: STOOL  ?Result Value Ref Range Status  ? C Diff antigen NEGATIVE NEGATIVE Final  ? C Diff toxin NEGATIVE NEGATIVE Final  ? C Diff interpretation No C. difficile detected.  Final  ?  Comment: Performed at [facility], [address] [place], [place]  ?  ?_______________________________________________ ?Hospitalist was called for admission for   Hypokalemia hyponatremia and QT prolongation ?   ?The  following Work up  has been ordered so far: ? ?Orders Placed This Encounter  ?Procedures  ? Comprehensive metabolic panel  ? Ethanol  ? cbc  ? Rapid urine drug screen (hospital performed)  ? Pregnancy, urine  ? Magnesium  ? Lipase, blood  ? Basic metabolic panel  ? Magnesium  ? Phosphorus  ? CK  ? CBC with Differential/Platelet  ? Clinical institute withdrawal assessment  ? Vital signs every 6 hours X 48 hours, then per unit protocol  ? If Ativan given, reassess Clinical Institute Withdrawal Assessment (CIWA) q 1 hour  ? Notify Pharmacy to change IV Ativan to PO if tolerating POs well.  ? Notify physician (specify)  ? Cardiac monitoring  ? Consult to Transition of Care Team  ? Consult to hospitalist  ? ED EKG  ? EKG 12-Lead  ? EKG 12-Lead  ? ED EKG  ? Place in observation (patient's expected length of stay will be less than 2 midnights)  ?  ? ?OTHER Significant initial  Findings: ? ?labs showing:  ?  ?Recent Labs  ?Lab 11/11/21 ?1313 11/11/21 ?1317  ?NA  --  126*  ?K  --  2.5*  ?CO2  --  30  ?GLUCOSE  --  144*  ?BUN  --  10  ?CREATININE  --  0.79  ?CALCIUM  --  9.4  ?MG 1.8  --   ?  ?Cr   stable,   ?Lab Results  ?Component Value Date  ? CREATININE 0.79 11/11/2021  ? CREATININE 0.63 10/09/2021  ? CREATININE 2.37 (H) 10/07/2021  ? ? ?Recent Labs  ?Lab 11/11/21 ?1317  ?AST 66*  ?ALT 31  ?ALKPHOS 108  ?BILITOT 2.7*  ?PROT 8.0  ?ALBUMIN 4.9  ? ?Lab Results  ?Component Value Date  ? CALCIUM 9.4 11/11/2021  ? PHOS 1.3 (L) 08/28/2021  ?  ?Plt: ?Lab Results  ?Component Value Date  ? PLT 201 11/11/2021  ? ?  ?COVID-19 Labs ? ?No results for input(s): DDIMER, FERRITIN, LDH, CRP in the last 72 hours. ? ?Lab Results  ?Component Value Date  ? SARSCOV2NAA NEGATIVE 08/26/2021  ? SARSCOV2NAA NEGATIVE 02/22/2021  ? SARSCOV2NAA NEGATIVE 01/17/2020  ? SARSCOV2NAA NEGATIVE 07/30/2019  ? ?  ?   ?Recent Labs  ?Lab 11/11/21 ?1317  ?WBC 3.2*  ?HGB 12.2  ?HCT 34.8*  ?MCV 85.3  ?PLT 201  ? ? ?HG/HCT  stable,   ?   ?Component Value Date/Time  ?  HGB 12.2 11/11/2021 1317  ? HCT 34.8 (L) 11/11/2021 1317  ? MCV 85.3 11/11/2021 1317  ?  ? ?Recent Labs  ?Lab 11/11/21 ?1313  ?LIPASE 24  ? ?Recent Labs  ?Lab 11/11/21 ?2044  ?AMMONIA 21  ? ?  ? ?Cardi

## 2021-11-11 NOTE — Assessment & Plan Note (Signed)
We will replace recheck may need additional doses ? ?

## 2021-11-11 NOTE — Assessment & Plan Note (Signed)
Will check T3-T4 levels may need to start treatment ?

## 2021-11-11 NOTE — Plan of Care (Signed)
41 year old F with PMH of depression, EtOH abuse/withdrawal with history of seizure and DT and prolonged QT presenting with cramping in the hands after binge drinking.  Reportedly drinks about a liter of vodka.  Last drink about 2 days ago.  Per ED PA, no withdrawal symptoms other than mild tachycardia and BP.  Significant labs include WBC 3.2, NA 126, K2.5, AST 66, total bili 2.7 and EtOH level 12.  Mg pending.  EKG sinus rhythm with QTc of 608.  Received 2/3 of IV KCl 10 mill equivalent and IV NS bolus.  Admission requested for hypokalemia and hyponatremia. Accepted to telemetry bed for observation for hypokalemia.  Advised ED PA to give additional p.o. KCl 40x2, and replenish magnesium as appropriate once result is in. ? ?Accepted to telemetry bed for observation overnight ? ?Vitals:  ? 11/11/21 1415 11/11/21 1445 11/11/21 1515 11/11/21 1530  ?BP: (!) 139/112 115/84 (!) 121/93 (!) 130/97  ?Pulse: (!) 101 94 96 (!) 110  ?Resp: 18 17 14  (!) 23  ?Temp:      ?TempSrc:      ?SpO2: 98% 98% 99% 100%  ?Weight:      ?Height:      ? ? ?

## 2021-11-11 NOTE — Assessment & Plan Note (Addendum)
Check urine electrolytes most likely in the setting of chronic alcohol consumption ?  ?

## 2021-11-11 NOTE — Assessment & Plan Note (Addendum)
Will replace, repeat in AM ?

## 2021-11-12 ENCOUNTER — Observation Stay (HOSPITAL_COMMUNITY): Payer: Self-pay

## 2021-11-12 ENCOUNTER — Encounter (HOSPITAL_COMMUNITY): Payer: Self-pay | Admitting: Student

## 2021-11-12 DIAGNOSIS — R9431 Abnormal electrocardiogram [ECG] [EKG]: Secondary | ICD-10-CM

## 2021-11-12 LAB — URINALYSIS, COMPLETE (UACMP) WITH MICROSCOPIC
Bacteria, UA: NONE SEEN
Bilirubin Urine: NEGATIVE
Glucose, UA: NEGATIVE mg/dL
Hgb urine dipstick: NEGATIVE
Ketones, ur: 20 mg/dL — AB
Leukocytes,Ua: NEGATIVE
Nitrite: NEGATIVE
Protein, ur: 30 mg/dL — AB
Specific Gravity, Urine: 1.011 (ref 1.005–1.030)
pH: 9 — ABNORMAL HIGH (ref 5.0–8.0)

## 2021-11-12 LAB — BASIC METABOLIC PANEL
Anion gap: 10 (ref 5–15)
Anion gap: 8 (ref 5–15)
BUN: 9 mg/dL (ref 6–20)
BUN: 12 mg/dL (ref 6–20)
CO2: 31 mmol/L (ref 22–32)
CO2: 31 mmol/L (ref 22–32)
Calcium: 9 mg/dL (ref 8.9–10.3)
Calcium: 9 mg/dL (ref 8.9–10.3)
Chloride: 93 mmol/L — ABNORMAL LOW (ref 98–111)
Chloride: 95 mmol/L — ABNORMAL LOW (ref 98–111)
Creatinine, Ser: 0.55 mg/dL (ref 0.44–1.00)
Creatinine, Ser: 0.75 mg/dL (ref 0.44–1.00)
GFR, Estimated: >60 mL/min (ref >60)
GFR, Estimated: >60 mL/min (ref >60)
Glucose, Bld: 81 mg/dL (ref 70–99)
Glucose, Bld: 88 mg/dL (ref 70–99)
Potassium: 3.2 mmol/L — ABNORMAL LOW (ref 3.5–5.1)
Potassium: 3.3 mmol/L — ABNORMAL LOW (ref 3.5–5.1)
Sodium: 134 mmol/L — ABNORMAL LOW (ref 135–145)
Sodium: 134 mmol/L — ABNORMAL LOW (ref 135–145)

## 2021-11-12 LAB — COMPREHENSIVE METABOLIC PANEL
ALT: 27 U/L (ref 0–44)
AST: 49 U/L — ABNORMAL HIGH (ref 15–41)
Albumin: 4 g/dL (ref 3.5–5.0)
Alkaline Phosphatase: 84 U/L (ref 38–126)
Anion gap: 8 (ref 5–15)
BUN: 8 mg/dL (ref 6–20)
CO2: 31 mmol/L (ref 22–32)
Calcium: 8.9 mg/dL (ref 8.9–10.3)
Chloride: 91 mmol/L — ABNORMAL LOW (ref 98–111)
Creatinine, Ser: 0.72 mg/dL (ref 0.44–1.00)
GFR, Estimated: >60 mL/min (ref >60)
Glucose, Bld: 113 mg/dL — ABNORMAL HIGH (ref 70–99)
Potassium: 3.9 mmol/L (ref 3.5–5.1)
Sodium: 130 mmol/L — ABNORMAL LOW (ref 135–145)
Total Bilirubin: 1.5 mg/dL — ABNORMAL HIGH (ref 0.3–1.2)
Total Protein: 6.8 g/dL (ref 6.5–8.1)

## 2021-11-12 LAB — MAGNESIUM: Magnesium: 2 mg/dL (ref 1.7–2.4)

## 2021-11-12 LAB — OSMOLALITY, URINE: Osmolality, Ur: 344 mOsm/kg (ref 300–900)

## 2021-11-12 LAB — ECHOCARDIOGRAM COMPLETE
AR max vel: 2.78 cm2
AV Area VTI: 2.78 cm2
AV Area mean vel: 2.72 cm2
AV Mean grad: 2 mmHg
AV Peak grad: 3.4 mmHg
Ao pk vel: 0.92 m/s
Area-P 1/2: 5.27 cm2
Calc EF: 53.9 %
Height: 70 in
S' Lateral: 3.4 cm
Single Plane A2C EF: 58.8 %
Single Plane A4C EF: 45.2 %
Weight: 1979.2 oz

## 2021-11-12 LAB — CK: Total CK: 378 U/L — ABNORMAL HIGH (ref 38–234)

## 2021-11-12 LAB — CBC
HCT: 31.8 % — ABNORMAL LOW (ref 36.0–46.0)
Hemoglobin: 10.7 g/dL — ABNORMAL LOW (ref 12.0–15.0)
MCH: 30.5 pg (ref 26.0–34.0)
MCHC: 33.6 g/dL (ref 30.0–36.0)
MCV: 90.6 fL (ref 80.0–100.0)
Platelets: 176 10*3/uL (ref 150–400)
RBC: 3.51 MIL/uL — ABNORMAL LOW (ref 3.87–5.11)
RDW: 15.7 % — ABNORMAL HIGH (ref 11.5–15.5)
WBC: 2.5 10*3/uL — ABNORMAL LOW (ref 4.0–10.5)
nRBC: 0 % (ref 0.0–0.2)

## 2021-11-12 LAB — HEPATITIS PANEL, ACUTE
HCV Ab: NONREACTIVE
Hep A IgM: NONREACTIVE
Hep B C IgM: NONREACTIVE
Hepatitis B Surface Ag: NONREACTIVE

## 2021-11-12 LAB — PREALBUMIN: Prealbumin: 28.2 mg/dL (ref 18–38)

## 2021-11-12 LAB — SODIUM, URINE, RANDOM: Sodium, Ur: 83 mmol/L

## 2021-11-12 LAB — T4, FREE: Free T4: 0.79 ng/dL (ref 0.61–1.12)

## 2021-11-12 LAB — CREATININE, URINE, RANDOM: Creatinine, Urine: 116.43 mg/dL

## 2021-11-12 LAB — PHOSPHORUS: Phosphorus: 1.8 mg/dL — ABNORMAL LOW (ref 2.5–4.6)

## 2021-11-12 MED ORDER — SODIUM PHOSPHATES 45 MMOLE/15ML IV SOLN
30.0000 mmol | Freq: Once | INTRAVENOUS | Status: AC
  Administered 2021-11-12: 30 mmol via INTRAVENOUS
  Filled 2021-11-12: qty 10

## 2021-11-12 MED ORDER — ENOXAPARIN SODIUM 40 MG/0.4ML IJ SOSY
40.0000 mg | PREFILLED_SYRINGE | INTRAMUSCULAR | Status: DC
Start: 2021-11-12 — End: 2021-11-13
  Administered 2021-11-12: 40 mg via SUBCUTANEOUS
  Filled 2021-11-12: qty 0.4

## 2021-11-12 MED ORDER — SODIUM CHLORIDE 0.9 % IV SOLN: INTRAVENOUS | Status: DC | PRN

## 2021-11-12 MED ORDER — ENSURE ENLIVE PO LIQD: 237.0000 mL | Freq: Two times a day (BID) | ORAL | Status: DC

## 2021-11-12 NOTE — TOC Progression Note (Signed)
Transition of Care (TOC) - Progression Note  ? ? ?Patient Details  ?Name: Sheila Graves ?MRN: [id number] ?Date of Birth: 02-24-1981 ? ?Transition of Care (TOC) CM/SW Contact  ?Geni Bers, RN ?Phone Number: ?11/12/2021, 10:26 AM ? ?Clinical Narrative:    ? ? ?Transition of Care (TOC) Screening Note ? ? ?Patient Details  ?Name: Sheila Graves ?Date of Birth: 10-Jan-1981 ? ? ?Transition of Care (TOC) CM/SW Contact:    ?Geni Bers, RN ?Phone Number: ?11/12/2021, 10:26 AM ? ?Spoke with pt concerning PCP. Instructed pt to call [facility] [phone] for a follow up appointment. ? ? ?Transition of Care Department [facility]) has reviewed patient and no TOC needs have been identified at this time. We will continue to monitor patient advancement through interdisciplinary progression rounds. If new patient transition needs arise, please place a TOC consult. ?  ? ?  ?  ? ?Expected Discharge Plan and Services ?  ?  ?  ?  ?  ?                ?  ?  ?  ?  ?  ?  ?  ?  ?  ?  ? ? ?Social Determinants of Health (SDOH) Interventions ?  ? ?Readmission Risk Interventions ?   ? View : No data to display.  ?  ?  ?  ? ? ?

## 2021-11-12 NOTE — Progress Notes (Signed)
?PROGRESS NOTE ? ? ? ?Sheila Graves  [id number] DOB: 1981/02/22 DOA: 11/11/2021 ?PCP: Pcp, No  ? ?Brief Narrative: ?41 year old with past medical history significant for depression and alcohol abuse presents complaining of cramping in her hands.  These usually happen after she has stopped drinking alcohol.  She has a prior history of DTs and seizure from alcohol withdrawal.  She was found to have hyponatremia with sodium of 126, potassium of 2.5 bilirubin of 2.7 alcohol level down to 12.  She is going through divorce. ? ?Assessment & Plan: ?  ?Principal Problem: ?  Hypokalemia ?Active Problems: ?  Prolonged QT interval ?  Alcohol use disorder, severe, dependence (HCC) ?  Hypophosphatemia ?  Hyponatremia ?  Elevated CK ?  Elevated TSH ? ? ?1-Hyponatremia: In the setting of alcohol abuse. ?Improving  with IV fluids. ?Continue with IV fluids, decreased IV rate.  ? ?2-Hypokalemia: Replace orally ? ?Long QT: In the setting of electrolyte abnormalities. ? ?Alcohol use disorder, severe dependence and alcohol withdrawal: ?Continue With CIWA protocol ?Last alcoholic drink was Sunday 3 days ago. ? ?Hypophosphatemia: Persist replace IV ?Elevated CK:  continue with IV fluids. Trending dowm  ?Elevated TSH; checking Free T 3 and T 4 ? ?Anemia ; check anemia panel.  ?Leukopenia; in setting alcohol. Check anemia panel.  ? ? ?Nutrition Problem: Moderate Malnutrition ?Etiology: acute illness (alcohol abuse, hypokalemia) ? ? ? ?Signs/Symptoms: mild fat depletion, mild muscle depletion, percent weight loss ?Percent weight loss: 12 % ? ? ? ?Interventions: Ensure Enlive (each supplement provides 350kcal and 20 grams of protein), MVI, Refer to RD note for recommendations, Liberalize Diet ? ?Estimated body mass index is 17.75 kg/m? as calculated from the following: ?  Height as of this encounter: 5\' 10"  (1.778 m). ?  Weight as of this encounter: 56.1 kg. ? ? ?DVT prophylaxis: Lovenox ? ?Code Status: Full code ?Family Communication: care  discussed with patient ?Disposition Plan:  ?Status is: Inpatient ?Remains inpatient appropriate because: needs IV fluids for management of electrolytes abnormalities.  ? ? ? ?Consultants:  ?None ? ?Procedures:  ?None ? ?Antimicrobials:  ? ? ?Subjective: ?She is feeling better. Less weak. No tremors on exam.  ? ?Objective: ?Vitals:  ? 11/11/21 2000 11/12/21 0002 11/12/21 0447 11/12/21 1324  ?BP:  113/71 109/78 109/72  ?Pulse:  87 79 80  ?Resp:  19 18 18   ?Temp:  98.9 ?F (37.2 ?C) 98.7 ?F (37.1 ?C) 99 ?F (37.2 ?C)  ?TempSrc:  Oral Oral Oral  ?SpO2:  100% 100% 100%  ?Weight: 56.1 kg     ?Height: 5\' 10"  (1.778 m)     ? ? ?Intake/Output Summary (Last 24 hours) at 11/12/2021 1803 ?Last data filed at 11/12/2021 1250 ?Gross per 24 hour  ?Intake 2025.74 ml  ?Output --  ?Net 2025.74 ml  ? ?Filed Weights  ? 11/11/21 1306 11/11/21 2000  ?Weight: 63.5 kg 56.1 kg  ? ? ?Examination: ? ?General exam: Appears calm and comfortable  ?Respiratory system: Clear to auscultation. Respiratory effort normal. ?Cardiovascular system: S1 & S2 heard, RRR. No JVD, murmurs, rubs, gallops or clicks. No pedal edema. ?Gastrointestinal system: Abdomen is nondistended, soft and nontender. No organomegaly or masses felt. Normal bowel sounds heard. ?Central nervous system: Alert and oriented.  ?Extremities: Symmetric 5 x 5 power. ? ? ?Data Reviewed: I have personally reviewed following labs and imaging studies ? ?CBC: ?Recent Labs  ?Lab 11/11/21 ?1317 11/11/21 ?2044 11/12/21 ?0425  ?WBC 3.2* 2.9* 2.5*  ?NEUTROABS  --  1.3*  --   ?  HGB 12.2 11.7* 10.7*  ?HCT 34.8* 32.8* 31.8*  ?MCV 85.3 87.9 90.6  ?PLT 201 183 176  ? ?Basic Metabolic Panel: ?Recent Labs  ?Lab 11/11/21 ?1313 11/11/21 ?1317 11/11/21 ?2044 11/12/21 ?0425 11/12/21 ?1141  ?NA  --  126* 128* 130* 134*  ?K  --  2.5* 3.1* 3.9 3.2*  ?CL  --  72* 84* 91* 93*  ?CO2  --  30 32 31 31  ?GLUCOSE  --  144* 102* 113* 81  ?BUN  --  10 8 8 9   ?CREATININE  --  0.79 0.87 0.72 0.55  ?CALCIUM  --  9.4 9.2 8.9 9.0   ?MG 1.8  --  2.0 2.0  --   ?PHOS  --   --  1.1* 1.8*  --   ? ?GFR: ?Estimated Creatinine Clearance: 84.4 mL/min (by C-G formula based on SCr of 0.55 mg/dL). ?Liver Function Tests: ?Recent Labs  ?Lab 11/11/21 ?1317 11/12/21 ?0425  ?AST 66* 49*  ?ALT 31 27  ?ALKPHOS 108 84  ?BILITOT 2.7* 1.5*  ?PROT 8.0 6.8  ?ALBUMIN 4.9 4.0  ? ?Recent Labs  ?Lab 11/11/21 ?1313  ?LIPASE 24  ? ?Recent Labs  ?Lab 11/11/21 ?2044  ?AMMONIA 21  ? ?Coagulation Profile: ?Recent Labs  ?Lab 11/11/21 ?2044  ?INR 0.9  ? ?Cardiac Enzymes: ?Recent Labs  ?Lab 11/11/21 ?2044 11/12/21 ?0425  ?CKTOTAL 534* 378*  ? ?BNP (last 3 results) ?No results for input(s): PROBNP in the last 8760 hours. ?HbA1C: ?No results for input(s): HGBA1C in the last 72 hours. ?CBG: ?No results for input(s): GLUCAP in the last 168 hours. ?Lipid Profile: ?No results for input(s): CHOL, HDL, LDLCALC, TRIG, CHOLHDL, LDLDIRECT in the last 72 hours. ?Thyroid Function Tests: ?Recent Labs  ?  11/11/21 ?2044 11/12/21 ?0425  ?TSH 20.434*  --   ?FREET4  --  0.79  ? ?Anemia Panel: ?No results for input(s): VITAMINB12, FOLATE, FERRITIN, TIBC, IRON, RETICCTPCT in the last 72 hours. ?Sepsis Labs: ?No results for input(s): PROCALCITON, LATICACIDVEN in the last 168 hours. ? ?No results found for this or any previous visit (from the past 240 hour(s)).  ? ? ? ? ? ?Radiology Studies: ?DG Chest 2 View ? ?Result Date: 11/11/2021 ?CLINICAL DATA:  Hyponatremia EXAM: CHEST - 2 VIEW COMPARISON:  02/20/2021 FINDINGS: Cardiac shadow is stable. The lungs are clear bilaterally. No effusion or infiltrate is seen. Postsurgical changes are again noted in the right clavicle. No acute bony abnormality is seen. IMPRESSION: No acute abnormality noted. Electronically Signed   By: 02/22/2021 M.D.   On: 11/11/2021 21:02  ? ?ECHOCARDIOGRAM COMPLETE ? ?Result Date: 11/12/2021 ?   ECHOCARDIOGRAM REPORT   Patient Name:   Sheila Graves Date of Exam: 11/12/2021 Medical Rec #:  11/14/2021   Height:       70.0 in Accession  #:    [id number]  Weight:       123.7 lb Date of Birth:  September 10, 1980   BSA:          1.702 m? Patient Age:    40 years    BP:           109/78 mmHg Patient Gender: F           HR:           81 bpm. Exam Location:  Inpatient Procedure: 2D Echo, Cardiac Doppler and Color Doppler Indications:    Abnormal EKG  History:        Patient has prior history of  Echocardiogram examinations, most                 recent 10/20/2019.  Sonographer:    Neomia Dear RDCS Referring Phys: [id number] ANASTASSIA DOUTOVA IMPRESSIONS  1. Left ventricular ejection fraction, by estimation, is 50 to 55%. The left ventricle has low normal function. The left ventricle has no regional wall motion abnormalities. Left ventricular diastolic parameters were normal.  2. Right ventricular systolic function is normal. The right ventricular size is normal. There is normal pulmonary artery systolic pressure.  3. The mitral valve is grossly normal. Trivial mitral valve regurgitation. No evidence of mitral stenosis.  4. The aortic valve is grossly normal. Aortic valve regurgitation is not visualized. No aortic stenosis is present.  5. The inferior vena cava is normal in size with <50% respiratory variability, suggesting right atrial pressure of 8 mmHg. Comparison(s): Prior images unable to be directly viewed, comparison made by report only. [facility] echo report reviewed from 10/20/19. EF reported 60-65%. Conclusion(s)/Recommendation(s): Otherwise normal echocardiogram, with minor abnormalities described in the report. FINDINGS  Left Ventricle: Left ventricular ejection fraction, by estimation, is 50 to 55%. The left ventricle has low normal function. The left ventricle has no regional wall motion abnormalities. The left ventricular internal cavity size was normal in size. There is no left ventricular hypertrophy. Left ventricular diastolic parameters were normal. Right Ventricle: The right ventricular size is normal. No increase in right ventricular wall thickness. Right  ventricular systolic function is normal. There is normal pulmonary artery systolic pressure. The tricuspid regurgitant velocity is 2.02 m/s, and  with an assumed right atrial pressure of 8 mmHg, the es

## 2021-11-12 NOTE — Progress Notes (Signed)
Initial Nutrition Assessment ? ?DOCUMENTATION CODES:  ? ?Underweight, Non-severe (moderate) malnutrition in context of acute illness/injury ? ?INTERVENTION:  ?- Liberalize diet from a heart healthy to a regular diet to provide widest variety of menu options to enhance nutritional adequacy ? ?- Double protein portions with each meal ? ?- Ensure Enlive po BID, each supplement provides 350 kcal and 20 grams of protein. ? ?- MVI with minerals daily ? ?- Check micronutrient labs for possible deficiencies: Vitamin C, zinc, anemia panel ? ?NUTRITION DIAGNOSIS:  ? ?Moderate Malnutrition related to acute illness (alcohol abuse, hypokalemia) as evidenced by mild fat depletion, mild muscle depletion, percent weight loss ? ?GOAL:  ? ?Patient will meet greater than or equal to 90% of their needs ? ?MONITOR:  ? ?PO intake, Supplement acceptance, Labs, Weight trends ? ?REASON FOR ASSESSMENT:  ? ?Consult ?Assessment of nutrition requirement/status ? ?ASSESSMENT:  ? ?Pt admitted with c/o hand pain, found to have hypokalemia. PMH significant for depression, alcohol abuse, hypokalemia, and QT prolongation. ? ?Pt states that during admission her appetite is much improved and feels hungry after her meals. She states that she went on a 3 day bender and drank 1/5 of vodka daily. She did not eat during this time and feels as though her body is trying to make up for the lack of nutritional intake during this time. Prior to this she reports having eaten well. She typically eats 6 small meals daily on calorically dense foods as this helps her not to feel so full and tired.  ? ?Meal completions: ?05/17: 100%-breakfast ? ?Pt noted to have R calf bruise which she is unsure how she got it. Pt states that she has chronically bruised easily. We discussed checking micronutrient labs to test for possible deficiencies include anemia. Pt is amenable. ? ?Pt reports a usual weight of 125 lbs and denies recent weight loss. Reviewed weight history. Pt's  weight remained stable within the last year however noted pt to have had a 12% weight loss within the last month which is clinically significant for time frame.  ? ?Pt meets criteria for acute malnutrition given mild fat and muscle depletions and stable weight history prior to the noted 12% weight loss within the last month, however suspect there to be a degree of underlying chronic malnutrition given history of alcohol abuse. Will continue to reassess throughout admission as appropriate.  ? ?Medications: folvite, MVI, KLOR-CON, thiamine ?IV drips: sodium phosphate in D5 ? ?Labs: sodium 130, phos 1.8, AST 49, total bilirubin 1.5 ? ?NUTRITION - FOCUSED PHYSICAL EXAM: ? ?Flowsheet Row Most Recent Value  ?Orbital Region Mild depletion  ?Upper Arm Region Mild depletion  ?Thoracic and Lumbar Region No depletion  ?Buccal Region No depletion  ?Temple Region Mild depletion  ?Clavicle Bone Region Mild depletion  ?Clavicle and Acromion Bone Region Mild depletion  ?Scapular Bone Region No depletion  ?Dorsal Hand No depletion  ?Patellar Region Severe depletion  ?Anterior Thigh Region Severe depletion  ?Posterior Calf Region Moderate depletion  ?Edema (RD Assessment) None  ?Hair Reviewed  ?Eyes Reviewed  ?Mouth Reviewed  ?Skin Reviewed  ?Nails Reviewed  ? ?  ? ?Diet Order:   ?Diet Order   ? ?       ?  Diet regular Room service appropriate? Yes; Fluid consistency: Thin  Diet effective now       ?  ? ?  ?  ? ?  ? ? ?EDUCATION NEEDS:  ? ?Education needs have been addressed ? ?Skin:  Skin Assessment: Reviewed RN Assessment ? ?Last BM:  5/16 ? ?Height:  ? ?Ht Readings from Last 1 Encounters:  ?11/11/21 5\' 10"  (1.778 m)  ? ? ?Weight:  ? ?Wt Readings from Last 1 Encounters:  ?11/11/21 56.1 kg  ? ?BMI:  Body mass index is 17.75 kg/m?. ? ?Estimated Nutritional Needs:  ? ?Kcal:  1800-2000 ? ?Protein:  80-95g ? ?Fluid:  >/=1.8L ? ?11/13/21, RDN, LDN ?Clinical Nutrition ?

## 2021-11-13 ENCOUNTER — Other Ambulatory Visit (HOSPITAL_COMMUNITY): Payer: Self-pay

## 2021-11-13 ENCOUNTER — Other Ambulatory Visit (HOSPITAL_BASED_OUTPATIENT_CLINIC_OR_DEPARTMENT_OTHER): Payer: Self-pay

## 2021-11-13 DIAGNOSIS — E440 Moderate protein-calorie malnutrition: Secondary | ICD-10-CM | POA: Insufficient documentation

## 2021-11-13 LAB — RETICULOCYTES
Immature Retic Fract: 11.1 % (ref 2.3–15.9)
RBC.: 3.39 MIL/uL — ABNORMAL LOW (ref 3.87–5.11)
Retic Count, Absolute: 48.8 10*3/uL (ref 19.0–186.0)
Retic Ct Pct: 1.4 % (ref 0.4–3.1)

## 2021-11-13 LAB — BASIC METABOLIC PANEL
Anion gap: 4 — ABNORMAL LOW (ref 5–15)
Anion gap: 7 (ref 5–15)
BUN: 10 mg/dL (ref 6–20)
BUN: 11 mg/dL (ref 6–20)
CO2: 26 mmol/L (ref 22–32)
CO2: 30 mmol/L (ref 22–32)
Calcium: 8.5 mg/dL — ABNORMAL LOW (ref 8.9–10.3)
Calcium: 8.8 mg/dL — ABNORMAL LOW (ref 8.9–10.3)
Chloride: 100 mmol/L (ref 98–111)
Chloride: 99 mmol/L (ref 98–111)
Creatinine, Ser: 0.54 mg/dL (ref 0.44–1.00)
Creatinine, Ser: 0.64 mg/dL (ref 0.44–1.00)
GFR, Estimated: >60 mL/min (ref >60)
GFR, Estimated: >60 mL/min (ref >60)
Glucose, Bld: 96 mg/dL (ref 70–99)
Glucose, Bld: 97 mg/dL (ref 70–99)
Potassium: 3.4 mmol/L — ABNORMAL LOW (ref 3.5–5.1)
Potassium: 3.6 mmol/L (ref 3.5–5.1)
Sodium: 132 mmol/L — ABNORMAL LOW (ref 135–145)
Sodium: 134 mmol/L — ABNORMAL LOW (ref 135–145)

## 2021-11-13 LAB — IRON AND TIBC
Iron: 31 ug/dL (ref 28–170)
Saturation Ratios: 11 % (ref 10.4–31.8)
TIBC: 286 ug/dL (ref 250–450)
UIBC: 255 ug/dL

## 2021-11-13 LAB — FOLATE: Folate: 24.4 ng/mL (ref >5.9)

## 2021-11-13 LAB — PHOSPHORUS: Phosphorus: 2.9 mg/dL (ref 2.5–4.6)

## 2021-11-13 LAB — FERRITIN: Ferritin: 83 ng/mL (ref 11–307)

## 2021-11-13 LAB — VITAMIN B12: Vitamin B-12: 533 pg/mL (ref 180–914)

## 2021-11-13 LAB — MAGNESIUM: Magnesium: 1.9 mg/dL (ref 1.7–2.4)

## 2021-11-13 LAB — T3: T3, Total: 143 ng/dL (ref 71–180)

## 2021-11-13 MED ORDER — FOLIC ACID 1 MG PO TABS
1.0000 mg | ORAL_TABLET | Freq: Every day | ORAL | 0 refills | Status: DC
  Filled 2021-11-13 (×2): qty 30, 30d supply, fill #0

## 2021-11-13 MED ORDER — POTASSIUM CHLORIDE CRYS ER 20 MEQ PO TBCR: 40.0000 meq | EXTENDED_RELEASE_TABLET | Freq: Once | ORAL | Status: DC

## 2021-11-13 MED ORDER — VITAMIN B-1 100 MG PO TABS
100.0000 mg | ORAL_TABLET | Freq: Every day | ORAL | 0 refills | Status: DC
  Filled 2021-11-13: qty 30, 30d supply, fill #0
  Filled 2021-11-13 (×2): qty 100, 100d supply, fill #0

## 2021-11-13 MED ORDER — FERROUS SULFATE 325 (65 FE) MG PO TABS
325.0000 mg | ORAL_TABLET | Freq: Every day | ORAL | 3 refills | Status: DC
  Filled 2021-11-13 (×2): qty 30, 30d supply, fill #0

## 2021-11-13 MED ORDER — POTASSIUM CHLORIDE ER 10 MEQ PO TBCR
10.0000 meq | EXTENDED_RELEASE_TABLET | Freq: Two times a day (BID) | ORAL | 0 refills | Status: DC
  Filled 2021-11-13 (×2): qty 10, 5d supply, fill #0

## 2021-11-13 MED ORDER — FERROUS SULFATE 325 (65 FE) MG PO TABS: 325.0000 mg | ORAL_TABLET | Freq: Every day | ORAL | Status: DC

## 2021-11-13 NOTE — TOC Progression Note (Signed)
Transition of Care [facility]) - Progression Note    Patient Details  Name: Sheila Graves MRN: [id number] Date of Birth: [birth year]  Transition of Care [facility]) CM/SW Contact  Geni Bers, RN Phone Number: 11/13/2021, 2:24 PM  Clinical Narrative:     Spoke with pt concerning Substance Abuse, pt states, "she do not need information." Appointment made at [facility] for June 8th at 0920 AM. Encouraged pt to keep this appointment.        Expected Discharge Plan and Services           Expected Discharge Date: 11/13/21                                     Social Determinants of Health (SDOH) Interventions    Readmission Risk Interventions     View : No data to display.

## 2021-11-13 NOTE — Progress Notes (Signed)
No change from am assessment. Discharge instructions reviewed. The patient denied questions, concerns at this time.

## 2021-11-13 NOTE — Progress Notes (Signed)
No change from am assessment. Discharge instructions were reviewed. Questions and concerns were denied at this time.

## 2021-11-13 NOTE — Discharge Summary (Signed)
Physician Discharge Summary  Sheila Graves AQT:[id number] DOB: 1980-07-20 DOA: 11/11/2021  PCP: Pcp, No  Admit date: 11/11/2021 Discharge date: 11/13/2021  Admitted From: Home Disposition:  Home   Recommendations for Outpatient Follow-up:  Follow up with PCP in 1-2 weeks Please obtain BMP/CBC in one week Needs to follow up with alcohol rehab.  Needs repeat TSH.     Discharge Condition: Stable.  CODE STATUS: Full code Diet recommendation: Heart Healthy   Brief/Interim Summary: 41 year old with past medical history significant for depression and alcohol abuse presents complaining of cramping in her hands.  These usually happen after she has stopped drinking alcohol.  She has a prior history of DTs and seizure from alcohol withdrawal.  She was found to have hyponatremia with sodium of 126, potassium of 2.5 bilirubin of 2.7 alcohol level down to 12.  She is going through divorce.   1-Hyponatremia: In the setting of alcohol abuse. Improving  with IV fluids. Stable for discharge.    2-Hypokalemia: Replaced   Long QT: In the setting of electrolyte abnormalities.   Alcohol use disorder, severe dependence and alcohol withdrawal: Continue With CIWA protocol Last alcoholic drink was Sunday 4 days ago. No evidence of withdrawal.   Hypophosphatemia: Replaced.  Elevated CK:  continue with IV fluids. Trending dowm  Elevated TSH; checking Free T 3 pending.  and T 4 normal   Anemia ; Iron trial ,Discharge on iron supplement.  Leukopenia; in setting alcohol. Iron deficiency. Discharge on iron supplement.      Nutrition Problem: Moderate Malnutrition Etiology: acute illness (alcohol abuse, hypokalemia)       Discharge Diagnoses:  Principal Problem:   Hypokalemia Active Problems:   Prolonged QT interval   Alcohol use disorder, severe, dependence (HCC)   Hypophosphatemia   Hyponatremia   Elevated CK   Elevated TSH   Malnutrition of moderate degree    Discharge  Instructions  Discharge Instructions     Diet - low sodium heart healthy   Complete by: As directed    Increase activity slowly   Complete by: As directed       Allergies as of 11/13/2021   No Known Allergies      Medication List     STOP taking these medications    naltrexone 50 MG tablet Commonly known as: DEPADE   potassium chloride SA 20 MEQ tablet Commonly known as: KLOR-CON M       TAKE these medications    aspirin-acetaminophen-caffeine 250-250-65 MG tablet Commonly known as: EXCEDRIN MIGRAINE Take 2 tablets by mouth every 6 (six) hours as needed for headache or migraine.   ferrous sulfate 325 (65 FE) MG tablet Take 1 tablet (325 mg total) by mouth daily with breakfast. Start taking on: Nov 14, 2021   folic acid 1 MG tablet Commonly known as: FOLVITE Take 1 tablet (1 mg total) by mouth daily. Start taking on: Nov 14, 2021   multivitamin capsule Take 1 capsule by mouth daily.   potassium chloride 10 MEQ tablet Commonly known as: KLOR-CON Take 1 tablet (10 mEq total) by mouth 2 (two) times daily.   thiamine 100 MG tablet Take 1 tablet (100 mg total) by mouth daily. Start taking on: Nov 14, 2021        No Known Allergies  Consultations: None   Procedures/Studies: DG Chest 2 View  Result Date: 11/11/2021 CLINICAL DATA:  Hyponatremia EXAM: CHEST - 2 VIEW COMPARISON:  02/20/2021 FINDINGS: Cardiac shadow is stable. The lungs are clear bilaterally. No effusion  or infiltrate is seen. Postsurgical changes are again noted in the right clavicle. No acute bony abnormality is seen. IMPRESSION: No acute abnormality noted. Electronically Signed   By: Alcide CleverMark  Lukens M.D.   On: 11/11/2021 21:02   ECHOCARDIOGRAM COMPLETE  Result Date: 11/12/2021    ECHOCARDIOGRAM REPORT   Patient Name:   Sheila Graves Date of Exam: 11/12/2021 Medical Rec #:  [id number]   Height:       70.0 in Accession #:    [id number]  Weight:       123.7 lb Date of Birth:  [birth year]   BSA:           1.702 m Patient Age:    40 years    BP:           109/78 mmHg Patient Gender: F           HR:           81 bpm. Exam Location:  Inpatient Procedure: 2D Echo, Cardiac Doppler and Color Doppler Indications:    Abnormal EKG  History:        Patient has prior history of Echocardiogram examinations, most                 recent 10/20/2019.  Sonographer:    Neomia DearAMARA CROWN RDCS Referring Phys: [id number] ANASTASSIA DOUTOVA IMPRESSIONS  1. Left ventricular ejection fraction, by estimation, is 50 to 55%. The left ventricle has low normal function. The left ventricle has no regional wall motion abnormalities. Left ventricular diastolic parameters were normal.  2. Right ventricular systolic function is normal. The right ventricular size is normal. There is normal pulmonary artery systolic pressure.  3. The mitral valve is grossly normal. Trivial mitral valve regurgitation. No evidence of mitral stenosis.  4. The aortic valve is grossly normal. Aortic valve regurgitation is not visualized. No aortic stenosis is present.  5. The inferior vena cava is normal in size with <50% respiratory variability, suggesting right atrial pressure of 8 mmHg. Comparison(s): Prior images unable to be directly viewed, comparison made by report only. [facility] echo report reviewed from 10/20/19. EF reported 60-65%. Conclusion(s)/Recommendation(s): Otherwise normal echocardiogram, with minor abnormalities described in the report. FINDINGS  Left Ventricle: Left ventricular ejection fraction, by estimation, is 50 to 55%. The left ventricle has low normal function. The left ventricle has no regional wall motion abnormalities. The left ventricular internal cavity size was normal in size. There is no left ventricular hypertrophy. Left ventricular diastolic parameters were normal. Right Ventricle: The right ventricular size is normal. No increase in right ventricular wall thickness. Right ventricular systolic function is normal. There is normal pulmonary artery  systolic pressure. The tricuspid regurgitant velocity is 2.02 m/s, and  with an assumed right atrial pressure of 8 mmHg, the estimated right ventricular systolic pressure is 24.3 mmHg. Left Atrium: Left atrial size was normal in size. Right Atrium: Right atrial size was not well visualized. Pericardium: There is no evidence of pericardial effusion. Mitral Valve: The mitral valve is grossly normal. Trivial mitral valve regurgitation. No evidence of mitral valve stenosis. Tricuspid Valve: The tricuspid valve is grossly normal. Tricuspid valve regurgitation is trivial. No evidence of tricuspid stenosis. Aortic Valve: The aortic valve is grossly normal. Aortic valve regurgitation is not visualized. No aortic stenosis is present. Aortic valve mean gradient measures 2.0 mmHg. Aortic valve peak gradient measures 3.4 mmHg. Aortic valve area, by VTI measures 2.78 cm. Pulmonic Valve: The pulmonic valve was not well visualized. Pulmonic valve regurgitation is  not visualized. Aorta: The aortic root, ascending aorta, aortic arch and descending aorta are all structurally normal, with no evidence of dilitation or obstruction. Venous: The inferior vena cava is normal in size with less than 50% respiratory variability, suggesting right atrial pressure of 8 mmHg. IAS/Shunts: The interatrial septum was not well visualized.  LEFT VENTRICLE PLAX 2D LVIDd:         3.90 cm     Diastology LVIDs:         3.40 cm     LV e' medial:    9.46 cm/s LV PW:         1.20 cm     LV E/e' medial:  6.9 LV IVS:        0.80 cm     LV e' lateral:   13.80 cm/s LVOT diam:     2.10 cm     LV E/e' lateral: 4.7 LV SV:         47 LV SV Index:   27 LVOT Area:     3.46 cm  LV Volumes (MOD) LV vol d, MOD A2C: 67.8 ml LV vol d, MOD A4C: 46.2 ml LV vol s, MOD A2C: 27.9 ml LV vol s, MOD A4C: 25.3 ml LV SV MOD A2C:     39.9 ml LV SV MOD A4C:     46.2 ml LV SV MOD BP:      31.0 ml RIGHT VENTRICLE RV Basal diam:  2.70 cm RV Mid diam:    1.50 cm RV S prime:     12.00  cm/s TAPSE (M-mode): 1.8 cm LEFT ATRIUM             Index LA Vol (A2C):   17.3 ml 10.17 ml/m LA Vol (A4C):   33.5 ml 19.69 ml/m LA Biplane Vol: 26.5 ml 15.57 ml/m  AORTIC VALVE AV Area (Vmax):    2.78 cm AV Area (Vmean):   2.72 cm AV Area (VTI):     2.78 cm AV Vmax:           91.60 cm/s AV Vmean:          68.200 cm/s AV VTI:            0.168 m AV Peak Grad:      3.4 mmHg AV Mean Grad:      2.0 mmHg LVOT Vmax:         73.60 cm/s LVOT Vmean:        53.500 cm/s LVOT VTI:          0.135 m LVOT/AV VTI ratio: 0.80  AORTA Ao Asc diam: 3.20 cm MITRAL VALVE               TRICUSPID VALVE MV Area (PHT): 5.27 cm    TR Peak grad:   16.3 mmHg MV Decel Time: 144 msec    TR Vmax:        202.00 cm/s MV E velocity: 65.10 cm/s MV A velocity: 49.00 cm/s  SHUNTS MV E/A ratio:  1.33        Systemic VTI:  0.14 m                            Systemic Diam: 2.10 cm Jodelle Red MD Electronically signed by Jodelle Red MD Signature Date/Time: 11/12/2021/12:54:40 PM    Final      Subjective: She is feeling better. Feels stable to go home  Discharge Exam: Vitals:  11/13/21 0545 11/13/21 1023  BP: 103/72 101/66  Pulse: 62 69  Resp: 20   Temp: 98.3 F (36.8 C) 99 F (37.2 C)  SpO2: 100% 99%     General: Pt is alert, awake, not in acute distress Cardiovascular: RRR, S1/S2 +, no rubs, no gallops Respiratory: CTA bilaterally, no wheezing, no rhonchi Abdominal: Soft, NT, ND, bowel sounds + Extremities: no edema, no cyanosis    The results of significant diagnostics from this hospitalization (including imaging, microbiology, ancillary and laboratory) are listed below for reference.     Microbiology: No results found for this or any previous visit (from the past 240 hour(s)).   Labs: BNP (last 3 results) No results for input(s): BNP in the last 8760 hours. Basic Metabolic Panel: Recent Labs  Lab 11/11/21 1313 11/11/21 1317 11/11/21 2044 11/12/21 0425 11/12/21 1141 11/12/21 1739  11/12/21 2352 11/13/21 0706  NA  --    < > 128* 130* 134* 134* 132* 134*  K  --    < > 3.1* 3.9 3.2* 3.3* 3.6 3.4*  CL  --    < > 84* 91* 93* 95* 99 100  CO2  --    < > 32 31 31 31 26 30   GLUCOSE  --    < > 102* 113* 81 88 96 97  BUN  --    < > 8 8 9 12 11 10   CREATININE  --    < > 0.87 0.72 0.55 0.75 0.64 0.54  CALCIUM  --    < > 9.2 8.9 9.0 9.0 8.5* 8.8*  MG 1.8  --  2.0 2.0  --   --   --  1.9  PHOS  --   --  1.1* 1.8*  --   --   --  2.9   < > = values in this interval not displayed.   Liver Function Tests: Recent Labs  Lab 11/11/21 1317 11/12/21 0425  AST 66* 49*  ALT 31 27  ALKPHOS 108 84  BILITOT 2.7* 1.5*  PROT 8.0 6.8  ALBUMIN 4.9 4.0   Recent Labs  Lab 11/11/21 1313  LIPASE 24   Recent Labs  Lab 11/11/21 2044  AMMONIA 21   CBC: Recent Labs  Lab 11/11/21 1317 11/11/21 2044 11/12/21 0425  WBC 3.2* 2.9* 2.5*  NEUTROABS  --  1.3*  --   HGB 12.2 11.7* 10.7*  HCT 34.8* 32.8* 31.8*  MCV 85.3 87.9 90.6  PLT 201 183 176   Cardiac Enzymes: Recent Labs  Lab 11/11/21 2044 11/12/21 0425  CKTOTAL 534* 378*   BNP: Invalid input(s): POCBNP CBG: No results for input(s): GLUCAP in the last 168 hours. D-Dimer No results for input(s): DDIMER in the last 72 hours. Hgb A1c No results for input(s): HGBA1C in the last 72 hours. Lipid Profile No results for input(s): CHOL, HDL, LDLCALC, TRIG, CHOLHDL, LDLDIRECT in the last 72 hours. Thyroid function studies Recent Labs    11/11/21 2044  TSH 20.434*   Anemia work up Recent Labs    11/12/21 2352  VITAMINB12 533  FOLATE 24.4  FERRITIN 83  TIBC 286  IRON 31  RETICCTPCT 1.4   Urinalysis    Component Value Date/Time   COLORURINE YELLOW 11/11/2021 2342   APPEARANCEUR CLEAR 11/11/2021 2342   LABSPEC 1.011 11/11/2021 2342   PHURINE 9.0 (H) 11/11/2021 2342   GLUCOSEU NEGATIVE 11/11/2021 2342   HGBUR NEGATIVE 11/11/2021 2342   BILIRUBINUR NEGATIVE 11/11/2021 2342   KETONESUR 20 (A)  11/11/2021 2342    PROTEINUR 30 (A) 11/11/2021 2342   NITRITE NEGATIVE 11/11/2021 2342   LEUKOCYTESUR NEGATIVE 11/11/2021 2342   Sepsis Labs Invalid input(s): PROCALCITONIN,  WBC,  LACTICIDVEN Microbiology No results found for this or any previous visit (from the past 240 hour(s)).   Time coordinating discharge: 40 minutes  SIGNED:   Alba Cory, MD  [facility]

## 2021-11-14 LAB — ZINC: Zinc: 53 ug/dL (ref 44–115)

## 2021-11-18 LAB — VITAMIN C: Vitamin C: 0.9 mg/dL (ref 0.4–2.0)

## 2021-11-29 ENCOUNTER — Telehealth: Payer: Self-pay | Admitting: Family Medicine

## 2021-11-29 DIAGNOSIS — R110 Nausea: Secondary | ICD-10-CM

## 2021-11-29 NOTE — Progress Notes (Signed)
Pt did not respond to further questioning. Await answer for e-visit. DWB

## 2021-12-04 ENCOUNTER — Ambulatory Visit: Payer: Self-pay | Admitting: Nurse Practitioner

## 2021-12-10 ENCOUNTER — Inpatient Hospital Stay: Payer: Self-pay | Admitting: Physician Assistant

## 2021-12-11 ENCOUNTER — Other Ambulatory Visit: Payer: Self-pay

## 2021-12-11 ENCOUNTER — Encounter (HOSPITAL_COMMUNITY): Payer: Self-pay | Admitting: Emergency Medicine

## 2021-12-11 ENCOUNTER — Emergency Department (HOSPITAL_COMMUNITY)
Admission: EM | Admit: 2021-12-11 | Discharge: 2021-12-11 | Disposition: A | Discharge status: Home / Self Care | Payer: Self-pay | Attending: Emergency Medicine | Admitting: Emergency Medicine

## 2021-12-11 ENCOUNTER — Emergency Department (HOSPITAL_COMMUNITY): Payer: Self-pay

## 2021-12-11 DIAGNOSIS — R5383 Other fatigue: Secondary | ICD-10-CM | POA: Insufficient documentation

## 2021-12-11 DIAGNOSIS — F1010 Alcohol abuse, uncomplicated: Secondary | ICD-10-CM

## 2021-12-11 DIAGNOSIS — R079 Chest pain, unspecified: Secondary | ICD-10-CM | POA: Insufficient documentation

## 2021-12-11 DIAGNOSIS — F32A Depression, unspecified: Secondary | ICD-10-CM

## 2021-12-11 LAB — MAGNESIUM: Magnesium: 1.9 mg/dL (ref 1.7–2.4)

## 2021-12-11 LAB — COMPREHENSIVE METABOLIC PANEL
ALT: 25 U/L (ref 0–44)
AST: 49 U/L — ABNORMAL HIGH (ref 15–41)
Albumin: 3.5 g/dL (ref 3.5–5.0)
Alkaline Phosphatase: 84 U/L (ref 38–126)
Anion gap: 15 (ref 5–15)
BUN: 13 mg/dL (ref 6–20)
CO2: 28 mmol/L (ref 22–32)
Calcium: 8.3 mg/dL — ABNORMAL LOW (ref 8.9–10.3)
Chloride: 90 mmol/L — ABNORMAL LOW (ref 98–111)
Creatinine, Ser: 0.7 mg/dL (ref 0.44–1.00)
GFR, Estimated: >60 mL/min (ref >60)
Glucose, Bld: 111 mg/dL — ABNORMAL HIGH (ref 70–99)
Potassium: 2.8 mmol/L — ABNORMAL LOW (ref 3.5–5.1)
Sodium: 133 mmol/L — ABNORMAL LOW (ref 135–145)
Total Bilirubin: 0.6 mg/dL (ref 0.3–1.2)
Total Protein: 6.3 g/dL — ABNORMAL LOW (ref 6.5–8.1)

## 2021-12-11 LAB — CBC WITH DIFFERENTIAL/PLATELET
Abs Immature Granulocytes: 0.01 10*3/uL (ref 0.00–0.07)
Basophils Absolute: 0 10*3/uL (ref 0.0–0.1)
Basophils Relative: 0 %
Eosinophils Absolute: 0 10*3/uL (ref 0.0–0.5)
Eosinophils Relative: 0 %
HCT: 38.3 % (ref 36.0–46.0)
Hemoglobin: 13.6 g/dL (ref 12.0–15.0)
Immature Granulocytes: 0 %
Lymphocytes Relative: 53 %
Lymphs Abs: 2.6 10*3/uL (ref 0.7–4.0)
MCH: 30.8 pg (ref 26.0–34.0)
MCHC: 35.5 g/dL (ref 30.0–36.0)
MCV: 86.7 fL (ref 80.0–100.0)
Monocytes Absolute: 0.5 10*3/uL (ref 0.1–1.0)
Monocytes Relative: 11 %
Neutro Abs: 1.8 10*3/uL (ref 1.7–7.7)
Neutrophils Relative %: 36 %
Platelets: 366 10*3/uL (ref 150–400)
RBC: 4.42 MIL/uL (ref 3.87–5.11)
RDW: 15.1 % (ref 11.5–15.5)
WBC: 5 10*3/uL (ref 4.0–10.5)
nRBC: 0 % (ref 0.0–0.2)

## 2021-12-11 LAB — ACETAMINOPHEN LEVEL
Acetaminophen (Tylenol), Serum: <10 ug/mL — ABNORMAL LOW (ref 10–30)
Acetaminophen (Tylenol), Serum: <10 ug/mL — ABNORMAL LOW (ref 10–30)

## 2021-12-11 LAB — TROPONIN I (HIGH SENSITIVITY)
Troponin I (High Sensitivity): <2 ng/L (ref <18)
Troponin I (High Sensitivity): 4 ng/L (ref <18)

## 2021-12-11 LAB — I-STAT BETA HCG BLOOD, ED (MC, WL, AP ONLY): I-stat hCG, quantitative: <5 m[IU]/mL (ref <5)

## 2021-12-11 LAB — ETHANOL: Alcohol, Ethyl (B): 559 mg/dL (ref <10)

## 2021-12-11 LAB — LIPASE, BLOOD: Lipase: 35 U/L (ref 11–51)

## 2021-12-11 LAB — SALICYLATE LEVEL: Salicylate Lvl: <7 mg/dL — ABNORMAL LOW (ref 7.0–30.0)

## 2021-12-11 MED ORDER — METOCLOPRAMIDE HCL 5 MG/ML IJ SOLN
10.0000 mg | Freq: Once | INTRAMUSCULAR | Status: AC
  Administered 2021-12-11: 10 mg via INTRAVENOUS
  Filled 2021-12-11: qty 2

## 2021-12-11 MED ORDER — THIAMINE HCL 100 MG/ML IJ SOLN
500.0000 mg | INTRAVENOUS | Status: AC
  Administered 2021-12-11: 500 mg via INTRAVENOUS
  Filled 2021-12-11: qty 5

## 2021-12-11 MED ORDER — POTASSIUM CHLORIDE 10 MEQ/100ML IV SOLN
10.0000 meq | INTRAVENOUS | Status: AC
  Administered 2021-12-11 (×3): 10 meq via INTRAVENOUS
  Filled 2021-12-11 (×3): qty 100

## 2021-12-11 NOTE — ED Triage Notes (Signed)
Pt BIB [facility] from home, pt initially complained of chest pain and cramping in her hands, hx low potassium. On EMS arrival, pt found to be highly intoxicated. EMS reports pt seen by PD earlier in the day for c/o SI. Pt responsive to pain.

## 2021-12-11 NOTE — ED Provider Notes (Signed)
  Physical Exam  BP 118/84   Pulse 80   Temp (!) 97 F (36.1 C) (Oral)   Resp 20   SpO2 95%   Physical Exam  Procedures  Procedures  ED Course / MDM    Medical Decision Making Amount and/or Complexity of Data Reviewed Labs: ordered. Radiology: ordered.  Risk Prescription drug management.   41 yo female ho etoh abuse called ems twice and second time told them drank a lot of wine.  Brought to ED and was altered. ETOH of >500 Plan reassessment when more alert Patient now arousable and awake and alert.  She states she in general has not been feeling well.  She reports depression but no suicidal or homicidal ideation.  She reports that she has difficulty accessing care due to finances.  Discussed community resources and will give these to patient.      Margarita Grizzle, MD 12/11/21 1151

## 2021-12-11 NOTE — ED Notes (Signed)
Pt up drinking water and walking to the bath room , alert and oriented

## 2021-12-11 NOTE — ED Notes (Signed)
Patient transported to X-ray 

## 2021-12-11 NOTE — ED Notes (Signed)
Pt placed in hallway and is on cardiac monitor.

## 2021-12-11 NOTE — ED Notes (Signed)
Dr. Madilyn Hook informed of patien'ts ethanol level of 559 via epic secure chat.

## 2021-12-11 NOTE — Discharge Instructions (Signed)
Please eat potassium related food and have your potassium rechecked Use the resource guide to find assistance for depression and substance abuse Please return if you are having worsening or new symptoms

## 2021-12-11 NOTE — ED Provider Notes (Signed)
[facility] EMERGENCY DEPARTMENT Provider Note   CSN: [id number] Arrival date & time: 12/11/21  0015     History  Chief Complaint  Patient presents with   Chest Pain    Sheila Graves is a 41 y.o. female.  The history is provided by the EMS personnel and medical records.  Chest Pain Sheila Graves is a 41 y.o. female who presents to the Emergency Department complaining of chest pain.  Level V caveat due to AMS.  Per EMS they were called out of chest pain.  Pt intoxicated on their arrival, reported drinking wine and stated she was concerned for low potassium.  Per EMS there was a PD call to the home earlier in the day for SI.  Pt did not comment on SI to EMS.  No concerning notes found in the home.       Home Medications Prior to Admission medications   Medication Sig Start Date End Date Taking? Authorizing Provider  aspirin-acetaminophen-caffeine (EXCEDRIN MIGRAINE) [phone] MG tablet Take 2 tablets by mouth every 6 (six) hours as needed for headache or migraine.    [provider]  ferrous sulfate 325 (65 FE) MG tablet Take 1 tablet (325 mg total) by mouth daily with breakfast. 11/14/21   Regalado, Belkys A, MD  folic acid (FOLVITE) 1 MG tablet Take 1 tablet (1 mg total) by mouth daily. 11/14/21   Regalado, Belkys A, MD  Multiple Vitamin (MULTIVITAMIN) capsule Take 1 capsule by mouth daily.    [provider]  potassium chloride (KLOR-CON) 10 MEQ tablet Take 1 tablet (10 mEq total) by mouth 2 (two) times daily. 11/13/21   Regalado, Belkys A, MD  thiamine (VITAMIN B-1) 100 MG tablet Take 1 tablet (100 mg total) by mouth daily. 11/14/21   Regalado, Belkys A, MD  escitalopram (LEXAPRO) 10 MG tablet Take 1 tablet (10 mg total) by mouth daily. Patient not taking: Reported on 09/16/2019 03/22/19 11/16/19  Eddie North, MD      Allergies    Patient has no known allergies.    Review of Systems   Review of Systems  Cardiovascular:  Positive for chest  pain.  All other systems reviewed and are negative.   Physical Exam Updated Vital Signs BP 118/84   Pulse 80   Temp (!) 97 F (36.1 C) (Oral)   Resp 20   SpO2 95%  Physical Exam Vitals and nursing note reviewed.  Constitutional:      Appearance: She is well-developed.     Comments: lethargic  HENT:     Head: Normocephalic and atraumatic.  Cardiovascular:     Rate and Rhythm: Normal rate and regular rhythm.     Heart sounds: No murmur heard. Pulmonary:     Effort: Pulmonary effort is normal. No respiratory distress.     Breath sounds: Normal breath sounds.  Abdominal:     Palpations: Abdomen is soft.     Tenderness: There is no abdominal tenderness. There is no guarding or rebound.  Musculoskeletal:        General: No tenderness.  Skin:    General: Skin is warm and dry.  Neurological:     Comments: Withdraws to painful stimuli, pupils midsized and reactive bilaterally.    Psychiatric:     Comments: Unable to assess     ED Results / Procedures / Treatments   Labs (all labs ordered are listed, but only abnormal results are displayed) Labs Reviewed  COMPREHENSIVE METABOLIC PANEL - Abnormal; Notable for  the following components:      Result Value   Sodium 133 (*)    Potassium 2.8 (*)    Chloride 90 (*)    Glucose, Bld 111 (*)    Calcium 8.3 (*)    Total Protein 6.3 (*)    AST 49 (*)    All other components within normal limits  ETHANOL - Abnormal; Notable for the following components:   Alcohol, Ethyl (B) 559 (*)    All other components within normal limits  ACETAMINOPHEN LEVEL - Abnormal; Notable for the following components:   Acetaminophen (Tylenol), Serum <10 (*)    All other components within normal limits  SALICYLATE LEVEL - Abnormal; Notable for the following components:   Salicylate Lvl <7.0 (*)    All other components within normal limits  ACETAMINOPHEN LEVEL - Abnormal; Notable for the following components:   Acetaminophen (Tylenol), Serum <10 (*)     All other components within normal limits  CBC WITH DIFFERENTIAL/PLATELET  LIPASE, BLOOD  MAGNESIUM  I-STAT BETA HCG BLOOD, ED ([facility], [facility], [facility] ONLY)  TROPONIN I (HIGH SENSITIVITY)  TROPONIN I (HIGH SENSITIVITY)    EKG EKG Interpretation  Date/Time:  Thursday December 11 2021 00:42:06 EDT Ventricular Rate:  82 PR Interval:  137 QRS Duration: 104 QT Interval:  407 QTC Calculation: 476 R Axis:   59 Text Interpretation: Sinus rhythm Confirmed by Tilden Fossa [phone]) on 12/11/2021 2:59:08 AM  Radiology DG Chest 1 View  Result Date: 12/11/2021 CLINICAL DATA:  Chest pain. EXAM: CHEST  1 VIEW COMPARISON:  Chest radiograph dated 11/11/2021. FINDINGS: No focal consolidation, pleural effusion, pneumothorax. The cardiac silhouette is within limits. No acute osseous pathology. Right clavicular fixation hardware. IMPRESSION: No active disease. Electronically Signed   By: Elgie Collard M.D.   On: 12/11/2021 01:26    Procedures Procedures    Medications Ordered in ED Medications  potassium chloride 10 mEq in 100 mL IVPB (0 mEq Intravenous Stopped 12/11/21 0521)  metoCLOPramide (REGLAN) injection 10 mg (10 mg Intravenous Given 12/11/21 0529)    ED Course/ Medical Decision Making/ A&P                           Medical Decision Making Amount and/or Complexity of Data Reviewed Labs: ordered. Radiology: ordered.  Risk Prescription drug management.  Patient with history of alcohol abuse here by EMS for complaint of chest pain.  Pt significantly intoxicated on ED evaluation, unable to provide meaningful hx.  No external evidence of trauma on evaluation.  EKG is without acute ischemic changes and troponins are negative x2.  Patient did awaken at 1 point during her ED stay and stated that she did not feel well, could not express herself further than that.  She did have nausea and she was treated with a dose of Reglan.  Patient promptly fell back asleep.  She is hypokalemic and potassium replaced  IV as patient is unable to tolerate oral at this time.  Patient care transferred pending metabolization of alcohol.  Once that she is more lucid further inquiry will be needed regarding concern for possible suicidal ideation due to EMS reporting that Police Department was called to her home earlier in the day.         Final Clinical Impression(s) / ED Diagnoses Final diagnoses:  None    Rx / DC Orders ED Discharge Orders     None         Tilden Fossa,  MD 12/11/21 3875

## 2022-01-10 ENCOUNTER — Encounter (HOSPITAL_COMMUNITY): Payer: Self-pay | Admitting: *Deleted

## 2022-01-10 ENCOUNTER — Other Ambulatory Visit: Payer: Self-pay

## 2022-01-10 ENCOUNTER — Inpatient Hospital Stay (HOSPITAL_COMMUNITY)
Admission: EM | Admit: 2022-01-10 | Discharge: 2022-01-14 | DRG: 896 | Disposition: A | Discharge status: Home / Self Care | Payer: Self-pay | Attending: Internal Medicine | Admitting: Internal Medicine

## 2022-01-10 ENCOUNTER — Emergency Department (HOSPITAL_COMMUNITY): Payer: Self-pay

## 2022-01-10 DIAGNOSIS — Z87891 Personal history of nicotine dependence: Secondary | ICD-10-CM

## 2022-01-10 DIAGNOSIS — J320 Chronic maxillary sinusitis: Secondary | ICD-10-CM | POA: Diagnosis present

## 2022-01-10 DIAGNOSIS — G4700 Insomnia, unspecified: Secondary | ICD-10-CM | POA: Diagnosis present

## 2022-01-10 DIAGNOSIS — I469 Cardiac arrest, cause unspecified: Principal | ICD-10-CM

## 2022-01-10 DIAGNOSIS — E873 Alkalosis: Secondary | ICD-10-CM | POA: Diagnosis present

## 2022-01-10 DIAGNOSIS — R680 Hypothermia, not associated with low environmental temperature: Secondary | ICD-10-CM | POA: Diagnosis present

## 2022-01-10 DIAGNOSIS — E876 Hypokalemia: Secondary | ICD-10-CM | POA: Diagnosis present

## 2022-01-10 DIAGNOSIS — Y908 Blood alcohol level of 240 mg/100 ml or more: Secondary | ICD-10-CM | POA: Diagnosis present

## 2022-01-10 DIAGNOSIS — S0083XA Contusion of other part of head, initial encounter: Secondary | ICD-10-CM | POA: Diagnosis present

## 2022-01-10 DIAGNOSIS — D61818 Other pancytopenia: Secondary | ICD-10-CM | POA: Diagnosis present

## 2022-01-10 DIAGNOSIS — F10129 Alcohol abuse with intoxication, unspecified: Principal | ICD-10-CM | POA: Diagnosis present

## 2022-01-10 DIAGNOSIS — F10929 Alcohol use, unspecified with intoxication, unspecified: Secondary | ICD-10-CM

## 2022-01-10 DIAGNOSIS — X58XXXA Exposure to other specified factors, initial encounter: Secondary | ICD-10-CM | POA: Diagnosis present

## 2022-01-10 DIAGNOSIS — S20222A Contusion of left back wall of thorax, initial encounter: Secondary | ICD-10-CM | POA: Diagnosis present

## 2022-01-10 DIAGNOSIS — I468 Cardiac arrest due to other underlying condition: Secondary | ICD-10-CM | POA: Diagnosis present

## 2022-01-10 DIAGNOSIS — Z79899 Other long term (current) drug therapy: Secondary | ICD-10-CM

## 2022-01-10 DIAGNOSIS — I959 Hypotension, unspecified: Secondary | ICD-10-CM | POA: Diagnosis present

## 2022-01-10 DIAGNOSIS — S20211A Contusion of right front wall of thorax, initial encounter: Secondary | ICD-10-CM | POA: Diagnosis present

## 2022-01-10 DIAGNOSIS — R9431 Abnormal electrocardiogram [ECG] [EKG]: Secondary | ICD-10-CM | POA: Diagnosis present

## 2022-01-10 DIAGNOSIS — J9601 Acute respiratory failure with hypoxia: Secondary | ICD-10-CM | POA: Diagnosis present

## 2022-01-10 DIAGNOSIS — T68XXXA Hypothermia, initial encounter: Secondary | ICD-10-CM

## 2022-01-10 LAB — CBG MONITORING, ED: Glucose-Capillary: 112 mg/dL — ABNORMAL HIGH (ref 70–99)

## 2022-01-10 MED ORDER — SODIUM CHLORIDE 0.9 % IV BOLUS
1000.0000 mL | Freq: Once | INTRAVENOUS | Status: AC
Start: 2022-01-11 — End: 2022-01-11
  Administered 2022-01-11: 1000 mL via INTRAVENOUS

## 2022-01-10 MED ORDER — SUCCINYLCHOLINE CHLORIDE 20 MG/ML IJ SOLN
INTRAMUSCULAR | Status: DC | PRN
  Administered 2022-01-10: 80 mg via INTRAVENOUS

## 2022-01-10 MED ORDER — ETOMIDATE 2 MG/ML IV SOLN
INTRAVENOUS | Status: DC | PRN
  Administered 2022-01-10: 10 mg via INTRAVENOUS

## 2022-01-10 MED ORDER — PROPOFOL 1000 MG/100ML IV EMUL: 5.0000 ug/kg/min | INTRAVENOUS | Status: DC

## 2022-01-10 NOTE — ED Triage Notes (Signed)
Pt arrived from home with EMS post cardiac arrest. Per report, pt drank her baseline 1 gallon of ETOH, started c/o of seeing green, collapsed, Fire started CPR then EMS took over CPR for 2 mins with ROSC obtained. Total CPR 7 mins. Initially had king airway, started gagging after ROSC ,king removed then NPA placed and bagged. IO to L tibia.

## 2022-01-10 NOTE — ED Provider Notes (Signed)
  [facility] EMERGENCY DEPARTMENT Provider Note   CSN: [id number] Arrival date & time: 01/10/22  2336     History {Add pertinent medical, surgical, social history, OB history to HPI:1} Chief Complaint  Patient presents with   Cardiac Arrest    Sheila Graves is a 41 y.o. female.   Cardiac Arrest      Home Medications Prior to Admission medications   Medication Sig Start Date End Date Taking? Authorizing Provider  aspirin-acetaminophen-caffeine (EXCEDRIN MIGRAINE) [phone] MG tablet Take 2 tablets by mouth every 6 (six) hours as needed for headache or migraine.    [provider]  ferrous sulfate 325 (65 FE) MG tablet Take 1 tablet (325 mg total) by mouth daily with breakfast. 11/14/21   Regalado, Belkys A, MD  folic acid (FOLVITE) 1 MG tablet Take 1 tablet (1 mg total) by mouth daily. 11/14/21   Regalado, Belkys A, MD  Multiple Vitamin (MULTIVITAMIN) capsule Take 1 capsule by mouth daily.    [provider]  potassium chloride (KLOR-CON) 10 MEQ tablet Take 1 tablet (10 mEq total) by mouth 2 (two) times daily. 11/13/21   Regalado, Belkys A, MD  thiamine (VITAMIN B-1) 100 MG tablet Take 1 tablet (100 mg total) by mouth daily. 11/14/21   Regalado, Belkys A, MD  escitalopram (LEXAPRO) 10 MG tablet Take 1 tablet (10 mg total) by mouth daily. Patient not taking: Reported on 09/16/2019 03/22/19 11/16/19  Eddie North, MD      Allergies    Patient has no known allergies.    Review of Systems   Review of Systems  Physical Exam Updated Vital Signs BP (!) 120/94   Pulse 75   Resp 19   SpO2 100%  Physical Exam  ED Results / Procedures / Treatments   Labs (all labs ordered are listed, but only abnormal results are displayed) Labs Reviewed  ACETAMINOPHEN LEVEL  COMPREHENSIVE METABOLIC PANEL  ETHANOL  SALICYLATE LEVEL  URINALYSIS, ROUTINE W REFLEX MICROSCOPIC  RAPID URINE DRUG SCREEN, HOSP PERFORMED  LACTIC ACID, PLASMA  LACTIC ACID, PLASMA   PROTIME-INR  I-STAT VENOUS BLOOD GAS, ED  I-STAT CHEM 8, ED  I-STAT BETA HCG BLOOD, ED ([facility], [facility], [facility] ONLY)  TROPONIN I (HIGH SENSITIVITY)    EKG None  Radiology No results found.  Procedures Procedures  {Document cardiac monitor, telemetry assessment procedure when appropriate:1}  Medications Ordered in ED Medications  sodium chloride 0.9 % bolus 1,000 mL (has no administration in time range)  etomidate (AMIDATE) injection (10 mg Intravenous Given 01/10/22 2349)  succinylcholine (ANECTINE) injection (80 mg Intravenous Given 01/10/22 2350)    ED Course/ Medical Decision Making/ A&P                           Medical Decision Making Amount and/or Complexity of Data Reviewed Labs: ordered. Radiology: ordered.  Risk Prescription drug management.   ***  {Document critical care time when appropriate:1} {Document review of labs and clinical decision tools ie heart score, Chads2Vasc2 etc:1}  {Document your independent review of radiology images, and any outside records:1} {Document your discussion with family members, caretakers, and with consultants:1} {Document social determinants of health affecting pt's care:1} {Document your decision making why or why not admission, treatments were needed:1} Final Clinical Impression(s) / ED Diagnoses Final diagnoses:  None    Rx / DC Orders ED Discharge Orders     None

## 2022-01-11 ENCOUNTER — Emergency Department (HOSPITAL_COMMUNITY): Payer: Self-pay

## 2022-01-11 DIAGNOSIS — I469 Cardiac arrest, cause unspecified: Principal | ICD-10-CM | POA: Diagnosis present

## 2022-01-11 DIAGNOSIS — F10929 Alcohol use, unspecified with intoxication, unspecified: Secondary | ICD-10-CM

## 2022-01-11 DIAGNOSIS — J9600 Acute respiratory failure, unspecified whether with hypoxia or hypercapnia: Secondary | ICD-10-CM

## 2022-01-11 DIAGNOSIS — E876 Hypokalemia: Secondary | ICD-10-CM

## 2022-01-11 LAB — CBC WITH DIFFERENTIAL/PLATELET
Abs Immature Granulocytes: 0.14 10*3/uL — ABNORMAL HIGH (ref 0.00–0.07)
Basophils Absolute: 0 10*3/uL (ref 0.0–0.1)
Basophils Relative: 0 %
Eosinophils Absolute: 0 10*3/uL (ref 0.0–0.5)
Eosinophils Relative: 0 %
HCT: 35.6 % — ABNORMAL LOW (ref 36.0–46.0)
Hemoglobin: 12.5 g/dL (ref 12.0–15.0)
Immature Granulocytes: 2 %
Lymphocytes Relative: 18 %
Lymphs Abs: 1.4 10*3/uL (ref 0.7–4.0)
MCH: 30.2 pg (ref 26.0–34.0)
MCHC: 35.1 g/dL (ref 30.0–36.0)
MCV: 86 fL (ref 80.0–100.0)
Monocytes Absolute: 0.7 10*3/uL (ref 0.1–1.0)
Monocytes Relative: 9 %
Neutro Abs: 5.2 10*3/uL (ref 1.7–7.7)
Neutrophils Relative %: 71 %
Platelets: 139 10*3/uL — ABNORMAL LOW (ref 150–400)
RBC: 4.14 MIL/uL (ref 3.87–5.11)
RDW: 16.5 % — ABNORMAL HIGH (ref 11.5–15.5)
WBC: 7.3 10*3/uL (ref 4.0–10.5)
nRBC: 0 % (ref 0.0–0.2)

## 2022-01-11 LAB — COMPREHENSIVE METABOLIC PANEL
ALT: 35 U/L (ref 0–44)
ALT: 41 U/L (ref 0–44)
AST: 78 U/L — ABNORMAL HIGH (ref 15–41)
AST: 97 U/L — ABNORMAL HIGH (ref 15–41)
Albumin: 3.2 g/dL — ABNORMAL LOW (ref 3.5–5.0)
Albumin: 3.8 g/dL (ref 3.5–5.0)
Alkaline Phosphatase: 75 U/L (ref 38–126)
Alkaline Phosphatase: 91 U/L (ref 38–126)
Anion gap: 15 (ref 5–15)
Anion gap: 17 — ABNORMAL HIGH (ref 5–15)
BUN: 11 mg/dL (ref 6–20)
BUN: 9 mg/dL (ref 6–20)
CO2: 27 mmol/L (ref 22–32)
CO2: 34 mmol/L — ABNORMAL HIGH (ref 22–32)
Calcium: 8 mg/dL — ABNORMAL LOW (ref 8.9–10.3)
Calcium: 8.7 mg/dL — ABNORMAL LOW (ref 8.9–10.3)
Chloride: 88 mmol/L — ABNORMAL LOW (ref 98–111)
Chloride: 97 mmol/L — ABNORMAL LOW (ref 98–111)
Creatinine, Ser: 0.71 mg/dL (ref 0.44–1.00)
Creatinine, Ser: 0.76 mg/dL (ref 0.44–1.00)
GFR, Estimated: >60 mL/min (ref >60)
GFR, Estimated: >60 mL/min (ref >60)
Glucose, Bld: 109 mg/dL — ABNORMAL HIGH (ref 70–99)
Glucose, Bld: 109 mg/dL — ABNORMAL HIGH (ref 70–99)
Potassium: 2.9 mmol/L — ABNORMAL LOW (ref 3.5–5.1)
Potassium: 2.9 mmol/L — ABNORMAL LOW (ref 3.5–5.1)
Sodium: 137 mmol/L (ref 135–145)
Sodium: 141 mmol/L (ref 135–145)
Total Bilirubin: 0.5 mg/dL (ref 0.3–1.2)
Total Bilirubin: 0.6 mg/dL (ref 0.3–1.2)
Total Protein: 5.8 g/dL — ABNORMAL LOW (ref 6.5–8.1)
Total Protein: 7 g/dL (ref 6.5–8.1)

## 2022-01-11 LAB — CBC
HCT: 33.7 % — ABNORMAL LOW (ref 36.0–46.0)
Hemoglobin: 11.8 g/dL — ABNORMAL LOW (ref 12.0–15.0)
MCH: 30 pg (ref 26.0–34.0)
MCHC: 35 g/dL (ref 30.0–36.0)
MCV: 85.8 fL (ref 80.0–100.0)
Platelets: 142 10*3/uL — ABNORMAL LOW (ref 150–400)
RBC: 3.93 MIL/uL (ref 3.87–5.11)
RDW: 16.6 % — ABNORMAL HIGH (ref 11.5–15.5)
WBC: 6.6 10*3/uL (ref 4.0–10.5)
nRBC: 0 % (ref 0.0–0.2)

## 2022-01-11 LAB — I-STAT ARTERIAL BLOOD GAS, ED
Acid-Base Excess: 13 mmol/L — ABNORMAL HIGH (ref 0.0–2.0)
Bicarbonate: 36.9 mmol/L — ABNORMAL HIGH (ref 20.0–28.0)
Calcium, Ion: 0.9 mmol/L — ABNORMAL LOW (ref 1.15–1.40)
HCT: 33 % — ABNORMAL LOW (ref 36.0–46.0)
Hemoglobin: 11.2 g/dL — ABNORMAL LOW (ref 12.0–15.0)
O2 Saturation: 100 %
Patient temperature: 94.9
Potassium: 2 mmol/L — CL (ref 3.5–5.1)
Sodium: 135 mmol/L (ref 135–145)
TCO2: 38 mmol/L — ABNORMAL HIGH (ref 22–32)
pCO2 arterial: 38.6 mmHg (ref 32–48)
pH, Arterial: 7.582 — ABNORMAL HIGH (ref 7.35–7.45)
pO2, Arterial: 616 mmHg — ABNORMAL HIGH (ref 83–108)

## 2022-01-11 LAB — URINALYSIS, ROUTINE W REFLEX MICROSCOPIC
Bilirubin Urine: NEGATIVE
Glucose, UA: NEGATIVE mg/dL
Ketones, ur: NEGATIVE mg/dL
Leukocytes,Ua: NEGATIVE
Nitrite: NEGATIVE
Protein, ur: 100 mg/dL — AB
Specific Gravity, Urine: 1.035 — ABNORMAL HIGH (ref 1.005–1.030)
pH: 7 (ref 5.0–8.0)

## 2022-01-11 LAB — GLUCOSE, CAPILLARY
Glucose-Capillary: 87 mg/dL (ref 70–99)
Glucose-Capillary: 89 mg/dL (ref 70–99)
Glucose-Capillary: 80 mg/dL (ref 70–99)

## 2022-01-11 LAB — RAPID URINE DRUG SCREEN, HOSP PERFORMED
Amphetamines: NOT DETECTED
Barbiturates: NOT DETECTED
Benzodiazepines: NOT DETECTED
Cocaine: NOT DETECTED
Opiates: NOT DETECTED
Tetrahydrocannabinol: NOT DETECTED

## 2022-01-11 LAB — ACETAMINOPHEN LEVEL: Acetaminophen (Tylenol), Serum: <10 ug/mL — ABNORMAL LOW (ref 10–30)

## 2022-01-11 LAB — TROPONIN I (HIGH SENSITIVITY)
Troponin I (High Sensitivity): 8 ng/L (ref <18)
Troponin I (High Sensitivity): 9 ng/L (ref <18)

## 2022-01-11 LAB — MRSA NEXT GEN BY PCR, NASAL: MRSA by PCR Next Gen: DETECTED — AB

## 2022-01-11 LAB — SALICYLATE LEVEL: Salicylate Lvl: <7 mg/dL — ABNORMAL LOW (ref 7.0–30.0)

## 2022-01-11 LAB — PROTIME-INR
INR: 0.9 (ref 0.8–1.2)
Prothrombin Time: 12.1 seconds (ref 11.4–15.2)

## 2022-01-11 LAB — LACTIC ACID, PLASMA
Lactic Acid, Venous: 2.5 mmol/L (ref 0.5–1.9)
Lactic Acid, Venous: 2.8 mmol/L (ref 0.5–1.9)
Lactic Acid, Venous: 4 mmol/L (ref 0.5–1.9)

## 2022-01-11 LAB — ETHANOL: Alcohol, Ethyl (B): 587 mg/dL (ref <10)

## 2022-01-11 LAB — MAGNESIUM
Magnesium: 1.7 mg/dL (ref 1.7–2.4)
Magnesium: 2.3 mg/dL (ref 1.7–2.4)

## 2022-01-11 MED ORDER — THIAMINE HCL 100 MG PO TABS
100.0000 mg | ORAL_TABLET | Freq: Every day | ORAL | Status: DC
  Administered 2022-01-13 – 2022-01-14 (×2): 100 mg via ORAL
  Filled 2022-01-11 (×2): qty 1

## 2022-01-11 MED ORDER — SODIUM CHLORIDE 0.9 % IV SOLN: INTRAVENOUS | Status: DC

## 2022-01-11 MED ORDER — PROPOFOL 1000 MG/100ML IV EMUL
INTRAVENOUS | Status: AC
  Filled 2022-01-11: qty 100

## 2022-01-11 MED ORDER — SODIUM CHLORIDE 0.9 % IV BOLUS
1000.0000 mL | Freq: Once | INTRAVENOUS | Status: AC
  Administered 2022-01-11: 1000 mL via INTRAVENOUS

## 2022-01-11 MED ORDER — LORAZEPAM 1 MG PO TABS
1.0000 mg | ORAL_TABLET | ORAL | Status: DC | PRN
  Administered 2022-01-13: 1 mg via ORAL
  Filled 2022-01-11: qty 1

## 2022-01-11 MED ORDER — ACETAMINOPHEN 325 MG PO TABS
650.0000 mg | ORAL_TABLET | ORAL | Status: DC | PRN
  Administered 2022-01-12 – 2022-01-13 (×4): 650 mg via ORAL
  Filled 2022-01-11 (×4): qty 2

## 2022-01-11 MED ORDER — POLYETHYLENE GLYCOL 3350 17 G PO PACK: 17.0000 g | PACK | Freq: Every day | ORAL | Status: DC

## 2022-01-11 MED ORDER — DOCUSATE SODIUM 100 MG PO CAPS: 100.0000 mg | ORAL_CAPSULE | Freq: Two times a day (BID) | ORAL | Status: DC | PRN

## 2022-01-11 MED ORDER — AMPICILLIN-SULBACTAM SODIUM 3 (2-1) G IJ SOLR
3.0000 g | Freq: Three times a day (TID) | INTRAMUSCULAR | Status: DC
  Administered 2022-01-11: 3 g via INTRAVENOUS
  Filled 2022-01-11: qty 8

## 2022-01-11 MED ORDER — LORAZEPAM 2 MG/ML IJ SOLN: 1.0000 mg | INTRAMUSCULAR | Status: DC | PRN

## 2022-01-11 MED ORDER — VASOPRESSIN 20 UNITS/100 ML INFUSION FOR SHOCK
INTRAVENOUS | Status: AC
  Filled 2022-01-11: qty 100

## 2022-01-11 MED ORDER — ACETAMINOPHEN 160 MG/5ML PO SOLN
650.0000 mg | ORAL | Status: DC
  Administered 2022-01-11 (×2): 650 mg
  Filled 2022-01-11 (×2): qty 20.3

## 2022-01-11 MED ORDER — PROPOFOL 1000 MG/100ML IV EMUL
INTRAVENOUS | Status: AC
  Administered 2022-01-11: 5 ug/kg/min via INTRAVENOUS
  Filled 2022-01-11: qty 100

## 2022-01-11 MED ORDER — CHLORHEXIDINE GLUCONATE CLOTH 2 % EX PADS
6.0000 | MEDICATED_PAD | Freq: Every day | CUTANEOUS | Status: DC
  Administered 2022-01-11 – 2022-01-13 (×3): 6 via TOPICAL

## 2022-01-11 MED ORDER — DOCUSATE SODIUM 50 MG/5ML PO LIQD
100.0000 mg | Freq: Two times a day (BID) | ORAL | Status: DC
  Administered 2022-01-11: 100 mg via ORAL
  Filled 2022-01-11: qty 10

## 2022-01-11 MED ORDER — POTASSIUM CHLORIDE 20 MEQ PO PACK
40.0000 meq | PACK | Freq: Once | ORAL | Status: AC
  Administered 2022-01-11: 40 meq
  Filled 2022-01-11: qty 2

## 2022-01-11 MED ORDER — SODIUM CHLORIDE 0.9 % IV SOLN
650.0000 mg | Freq: Once | INTRAVENOUS | Status: AC
  Administered 2022-01-11: 650 mg via INTRAVENOUS
  Filled 2022-01-11: qty 5

## 2022-01-11 MED ORDER — VASOPRESSIN 20 UNITS/100 ML INFUSION FOR SHOCK
0.0000 [IU]/min | INTRAVENOUS | Status: DC
  Administered 2022-01-11: 0.03 [IU]/min via INTRAVENOUS

## 2022-01-11 MED ORDER — ADULT MULTIVITAMIN W/MINERALS CH
1.0000 | ORAL_TABLET | Freq: Every day | ORAL | Status: DC
  Administered 2022-01-12 – 2022-01-14 (×3): 1 via ORAL
  Filled 2022-01-11 (×3): qty 1

## 2022-01-11 MED ORDER — ORAL CARE MOUTH RINSE
15.0000 mL | OROMUCOSAL | Status: DC
Start: 2022-01-11 — End: 2022-01-11
  Administered 2022-01-11 (×6): 15 mL via OROMUCOSAL

## 2022-01-11 MED ORDER — FOLIC ACID 1 MG PO TABS
1.0000 mg | ORAL_TABLET | Freq: Every day | ORAL | Status: DC
  Administered 2022-01-12 – 2022-01-14 (×3): 1 mg via ORAL
  Filled 2022-01-11 (×3): qty 1

## 2022-01-11 MED ORDER — FOLIC ACID 1 MG PO TABS
1.0000 mg | ORAL_TABLET | Freq: Every day | ORAL | Status: DC
  Administered 2022-01-11: 1 mg
  Filled 2022-01-11: qty 1

## 2022-01-11 MED ORDER — ONDANSETRON HCL 4 MG/2ML IJ SOLN: 4.0000 mg | Freq: Four times a day (QID) | INTRAMUSCULAR | Status: DC | PRN

## 2022-01-11 MED ORDER — MAGNESIUM SULFATE 2 GM/50ML IV SOLN: 2.0000 g | Freq: Once | INTRAVENOUS | Status: DC | PRN

## 2022-01-11 MED ORDER — ACETAMINOPHEN 160 MG/5ML PO SOLN: 650.0000 mg | ORAL | Status: DC | PRN

## 2022-01-11 MED ORDER — PANTOPRAZOLE 2 MG/ML SUSPENSION
40.0000 mg | Freq: Every day | ORAL | Status: DC
  Administered 2022-01-11: 40 mg
  Filled 2022-01-11: qty 20

## 2022-01-11 MED ORDER — ACETAMINOPHEN 325 MG PO TABS: 650.0000 mg | ORAL_TABLET | ORAL | Status: DC

## 2022-01-11 MED ORDER — FENTANYL CITRATE PF 50 MCG/ML IJ SOSY
50.0000 ug | PREFILLED_SYRINGE | Freq: Once | INTRAMUSCULAR | Status: AC
  Administered 2022-01-11: 50 ug via INTRAVENOUS

## 2022-01-11 MED ORDER — BUSPIRONE HCL 15 MG PO TABS: 30.0000 mg | ORAL_TABLET | Freq: Three times a day (TID) | ORAL | Status: DC | PRN

## 2022-01-11 MED ORDER — THIAMINE HCL 100 MG/ML IJ SOLN
500.0000 mg | Freq: Once | INTRAVENOUS | Status: AC
  Administered 2022-01-11: 500 mg via INTRAVENOUS
  Filled 2022-01-11: qty 5

## 2022-01-11 MED ORDER — FENTANYL 2500MCG IN NS 250ML (10MCG/ML) PREMIX INFUSION
50.0000 ug/h | INTRAVENOUS | Status: DC
  Administered 2022-01-11: 50 ug/h via INTRAVENOUS
  Filled 2022-01-11: qty 250

## 2022-01-11 MED ORDER — POTASSIUM CHLORIDE 10 MEQ/100ML IV SOLN
10.0000 meq | INTRAVENOUS | Status: AC
  Administered 2022-01-11 (×4): 10 meq via INTRAVENOUS
  Filled 2022-01-11 (×4): qty 100

## 2022-01-11 MED ORDER — ACETAMINOPHEN 325 MG PO TABS: 650.0000 mg | ORAL_TABLET | ORAL | Status: DC | PRN

## 2022-01-11 MED ORDER — ACETAMINOPHEN 650 MG RE SUPP: 650.0000 mg | RECTAL | Status: DC | PRN

## 2022-01-11 MED ORDER — DOCUSATE SODIUM 50 MG/5ML PO LIQD
100.0000 mg | Freq: Two times a day (BID) | ORAL | Status: DC
  Administered 2022-01-11: 100 mg
  Filled 2022-01-11: qty 10

## 2022-01-11 MED ORDER — PROPOFOL 1000 MG/100ML IV EMUL: 0.0000 ug/kg/min | INTRAVENOUS | Status: DC

## 2022-01-11 MED ORDER — THIAMINE HCL 100 MG/ML IJ SOLN
500.0000 mg | Freq: Three times a day (TID) | INTRAVENOUS | Status: AC
  Administered 2022-01-11 – 2022-01-12 (×5): 500 mg via INTRAVENOUS
  Filled 2022-01-11 (×5): qty 5

## 2022-01-11 MED ORDER — ADULT MULTIVITAMIN W/MINERALS CH
1.0000 | ORAL_TABLET | Freq: Every day | ORAL | Status: DC
  Administered 2022-01-11: 1
  Filled 2022-01-11: qty 1

## 2022-01-11 MED ORDER — MUPIROCIN 2 % EX OINT
1.0000 | TOPICAL_OINTMENT | Freq: Two times a day (BID) | CUTANEOUS | Status: DC
  Administered 2022-01-11 – 2022-01-13 (×6): 1 via NASAL
  Filled 2022-01-11: qty 22

## 2022-01-11 MED ORDER — CALCIUM GLUCONATE-NACL 1-0.675 GM/50ML-% IV SOLN
1.0000 g | Freq: Once | INTRAVENOUS | Status: AC
  Administered 2022-01-11: 1000 mg via INTRAVENOUS
  Filled 2022-01-11: qty 50

## 2022-01-11 MED ORDER — ENOXAPARIN SODIUM 40 MG/0.4ML IJ SOSY
40.0000 mg | PREFILLED_SYRINGE | INTRAMUSCULAR | Status: DC
  Administered 2022-01-11 – 2022-01-13 (×3): 40 mg via SUBCUTANEOUS
  Filled 2022-01-11 (×3): qty 0.4

## 2022-01-11 MED ORDER — POLYETHYLENE GLYCOL 3350 17 G PO PACK: 17.0000 g | PACK | Freq: Every day | ORAL | Status: DC | PRN

## 2022-01-11 MED ORDER — ORAL CARE MOUTH RINSE: 15.0000 mL | OROMUCOSAL | Status: DC | PRN

## 2022-01-11 MED ORDER — IOHEXOL 300 MG/ML  SOLN
80.0000 mL | Freq: Once | INTRAMUSCULAR | Status: AC | PRN
  Administered 2022-01-11: 80 mL via INTRAVENOUS

## 2022-01-11 MED ORDER — FENTANYL BOLUS VIA INFUSION
50.0000 ug | INTRAVENOUS | Status: DC | PRN
  Administered 2022-01-11 (×2): 50 ug via INTRAVENOUS

## 2022-01-11 MED ORDER — MAGNESIUM SULFATE 2 GM/50ML IV SOLN
2.0000 g | Freq: Once | INTRAVENOUS | Status: AC
  Administered 2022-01-11: 2 g via INTRAVENOUS
  Filled 2022-01-11: qty 50

## 2022-01-11 MED ORDER — POTASSIUM CHLORIDE 20 MEQ PO PACK
80.0000 meq | PACK | Freq: Once | ORAL | Status: DC
Start: 2022-01-11 — End: 2022-01-11

## 2022-01-11 MED ORDER — ACETAMINOPHEN 650 MG RE SUPP: 650.0000 mg | RECTAL | Status: DC

## 2022-01-11 MED ORDER — PANTOPRAZOLE 2 MG/ML SUSPENSION: 40.0000 mg | Freq: Every day | ORAL | Status: DC

## 2022-01-11 MED ORDER — POLYETHYLENE GLYCOL 3350 17 G PO PACK
17.0000 g | PACK | Freq: Every day | ORAL | Status: DC
  Administered 2022-01-11: 17 g
  Filled 2022-01-11: qty 1

## 2022-01-11 MED ORDER — THIAMINE HCL 100 MG/ML IJ SOLN
100.0000 mg | Freq: Every day | INTRAMUSCULAR | Status: DC
  Administered 2022-01-11: 100 mg via INTRAVENOUS
  Filled 2022-01-11: qty 2

## 2022-01-11 MED ORDER — MELATONIN 3 MG PO TABS
3.0000 mg | ORAL_TABLET | Freq: Once | ORAL | Status: AC
  Administered 2022-01-11: 3 mg via ORAL
  Filled 2022-01-11: qty 1

## 2022-01-11 NOTE — Progress Notes (Signed)
eLink Physician-Brief Progress Note Patient Name: Sheila Graves DOB: 02/13/1981 MRN: [id number]   Date of Service  01/11/2022  HPI/Events of Note  Insomnia. Requesting melatonin. RN notes no issues with oral medications  eICU Interventions  Low dose melatonin x 1      Intervention Category Minor Interventions: Routine modifications to care plan (e.g. PRN medications for pain, fever)  Oretha Milch 01/11/2022, 9:48 PM

## 2022-01-11 NOTE — ED Notes (Signed)
Father Adalynd Donahoe [phone] would like an update asap

## 2022-01-11 NOTE — Progress Notes (Signed)
Pharmacy Antibiotic Note  Danissa Rundle is a 41 y.o. female admitted on 01/10/2022 with  aspiration pneumonia .  Pharmacy has been consulted for Unasyn dosing. S/P brief cardiac arrest at home. WBC WNL. Renal function ok.   Plan: Unasyn 3g IV q8h Trend WBC, temp, renal function  F/U infectious work-up   Height: 5\' 10"  (177.8 cm) IBW/kg (Calculated) : 68.5  Temp (24hrs), Avg:94.6 F (34.8 C), Min:94.1 F (34.5 C), Max:95 F (35 C)  Recent Labs  Lab 01/10/22 2346 01/11/22 0005 01/11/22 0145  WBC  --   --  7.3  CREATININE 0.71  --   --   LATICACIDVEN  --  2.5*  --     CrCl cannot be calculated (Unknown ideal weight.).    No Known Allergies  01/13/22, PharmD, BCPS Clinical Pharmacist Phone: [phone]

## 2022-01-11 NOTE — Progress Notes (Signed)
[facility] ADULT ICU REPLACEMENT PROTOCOL   The patient does apply for the [facility] Adult ICU Electrolyte Replacment Protocol based on the criteria listed below:   1.Exclusion criteria: TCTS patients, ECMO patients, and Dialysis patients 2. Is GFR >/= 30 ml/min? Yes.    Patient's GFR today is >60 3. Is SCr </= 2? Yes.   Patient's SCr is 0.76 mg/dL 4. Did SCr increase >/= 0.5 in 24 hours? No. 5.Pt's weight >40kg  Yes.   6. Abnormal electrolyte(s): K, Mag  7. Electrolytes replaced per protocol 8.  Call MD STAT for K+ </= 2.5, Phos </= 1, or Mag </= 1 Physician:  Forbes Cellar [facility] 01/11/2022 6:48 AM

## 2022-01-11 NOTE — Progress Notes (Signed)
RT NOTE:  Pt transported to 2M04 without event. Report given to Edsel, RRT.

## 2022-01-11 NOTE — Progress Notes (Addendum)
eLink Physician-Brief Progress Note Patient Name: Sheila Graves DOB: [birth year] MRN: [id number]   Date of Service  01/11/2022  HPI/Events of Note  68 F ETOH abuse, witnessed collapse, EMS initiated CPR and King airway placed but started gagging. Intubated in the ED. Temp 93. Pan CT without acute findings. K 2.9, ETOH 587, UDS negative. Hypotensive on Propofol 50 and Fentanyl 50.  eICU Interventions  Ordered 1000 cc LR bolus Hold sedation as tolerated. May resume Fentanyl as needed. Seen moving her head back and forth Ordered vasopressin. If persistently requiring pressor will need a central line. Informed bedside CCM team. Norepinephrine and phenylephrine prohibitive due to prolonged QT. About to start K correction Ordered arterial line     Intervention Category Evaluation Type: New Patient Evaluation  Darl Pikes 01/11/2022, 3:10 AM
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
## 2022-01-11 NOTE — Procedures (Signed)
Extubation Procedure Note  Patient Details:   Name: Sheila Graves DOB: 1981/05/10 MRN: [id number]   Airway Documentation:    Vent end date: 01/11/22 Vent end time: 1545   Evaluation  O2 sats: stable throughout Complications: No apparent complications Patient did tolerate procedure well. Bilateral Breath Sounds: Clear, Diminished   Yes   Pt extubated per physician order. Pt suctioned orally and via ETT prior, positive cuff leak. Pt extubated to 4L nasal cannula without complication. No stridor heard, pt able to speak name and give a good cough. RT will continue to monitor and be available as needed.   Derinda Late 01/11/2022, 3:47 PM

## 2022-01-11 NOTE — ED Notes (Signed)
ED TO INPATIENT HANDOFF REPORT    S Name/Age/Gender Sheila Graves 41 y.o. female Room/Bed: TRAAC/TRAAC  Code Status: Full  Home   Triage Complete: Triage complete  Chief Complaint Post CPR  Triage Note Pt arrived from home with EMS post cardiac arrest. Per report, pt drank her baseline 1 gallon of ETOH, started c/o of seeing green, collapsed, Fire started CPR then EMS took over CPR for 2 mins with ROSC obtained. Total CPR 7 mins. Initially had king airway, started gagging after ROSC ,king removed then NPA placed and bagged. IO to L tibia.    Allergies No Known Allergies  Level of Care/Admitting Diagnosis ED Disposition     ED Disposition  Admit   Condition  --   Comment  The patient appears reasonably stabilized for admission considering the current resources, flow, and capabilities available in the ED at this time, and I doubt any other [facility] requiring further screening and/or treatment in the ED prior to admission is  present.          B Medical/Surgery History Past Medical History:  Diagnosis Date   Alcohol withdrawal seizure (HCC)    Alcoholic (HCC)    ETOH abuse    Hypokalemia    Pancreatitis    Thyroid disease    Past Surgical History:  Procedure Laterality Date   FINGER SURGERY     IRRIGATION AND DEBRIDEMENT SHOULDER Right 11/12/2018   Procedure: Irrigation And Debridement Shoulder;  Surgeon: Roby Lofts, MD;  Location: [facility] OR;  Service: Orthopedics;  Laterality: Right;   ORIF CLAVICULAR FRACTURE Right 11/12/2018   Procedure: OPEN REDUCTION INTERNAL FIXATION (ORIF) CLAVICULAR FRACTURE;  Surgeon: Roby Lofts, MD;  Location: [facility] OR;  Service: Orthopedics;  Laterality: Right;     A IV Location/Drains/Wounds Patient Lines/Drains/Airways Status     Active Line/Drains/Airways     Name Placement date Placement time Site Days   Peripheral IV 01/10/22 20 G Posterior;Right Hand 01/10/22  2348  Hand  1   Peripheral IV 01/10/22 20 G Anterior;Right  Forearm 01/10/22  2353  Forearm  1   Peripheral IV 01/10/22 20 G Anterior;Left Foot 01/10/22  2353  Foot  1   NG/OG Vented/Dual Lumen 14 Fr. Oral 01/11/22  0045  Oral  less than 1   Urethral Catheter Non-latex 14 Fr. 01/11/22  0046  Non-latex  less than 1   Airway 7.5 mm 01/10/22  2350  -- 1   Intraosseous Line 01/10/22 Proximal tibia 01/10/22  2348  --  1            Intake/Output Last 24 hours  Intake/Output Summary (Last 24 hours) at 01/11/2022 0146 Last data filed at 01/11/2022 0052 Gross per 24 hour  Intake 1000 ml  Output --  Net 1000 ml    Labs/Imaging Results for orders placed or performed during the hospital encounter of 01/10/22 (from the past 48 hour(s))  Comprehensive metabolic panel     Status: Abnormal   Collection Time: 01/10/22 11:46 PM  Result Value Ref Range   Sodium 137 135 - 145 mmol/L   Potassium 2.9 (L) 3.5 - 5.1 mmol/L   Chloride 88 (L) 98 - 111 mmol/L   CO2 34 (H) 22 - 32 mmol/L   Glucose, Bld 109 (H) 70 - 99 mg/dL    Comment: Glucose reference range applies only to samples taken after fasting for at least 8 hours.   BUN 11 6 - 20 mg/dL   Creatinine, Ser 0.34 0.44 - 1.00  mg/dL   Calcium 8.7 (L) 8.9 - 10.3 mg/dL   Total Protein 7.0 6.5 - 8.1 g/dL   Albumin 3.8 3.5 - 5.0 g/dL   AST 97 (H) 15 - 41 U/L   ALT 41 0 - 44 U/L   Alkaline Phosphatase 91 38 - 126 U/L   Total Bilirubin 0.6 0.3 - 1.2 mg/dL   GFR, Estimated >16 >10 mL/min    Comment: (NOTE) Calculated using the CKD-EPI Creatinine Equation (2021)    Anion gap 15 5 - 15    Comment: Performed at [facility], [address] [place], [place] [address]  Troponin I (High Sensitivity)     Status: None   Collection Time: 01/10/22 11:46 PM  Result Value Ref Range   Troponin I (High Sensitivity) 9 <18 ng/L    Comment: (NOTE) Elevated high sensitivity troponin I (hsTnI) values and significant  changes across serial measurements may suggest ACS but many other  chronic and acute conditions are  known to elevate hsTnI results.  Refer to the "Links" section for chest pain algorithms and additional  guidance. Performed at [facility], [address], [place], [place] [address]   CBG monitoring, ED     Status: Abnormal   Collection Time: 01/10/22 11:58 PM  Result Value Ref Range   Glucose-Capillary 112 (H) 70 - 99 mg/dL    Comment: Glucose reference range applies only to samples taken after fasting for at least 8 hours.  Acetaminophen level     Status: Abnormal   Collection Time: 01/11/22 12:05 AM  Result Value Ref Range   Acetaminophen (Tylenol), Serum <10 (L) 10 - 30 ug/mL    Comment: (NOTE) Therapeutic concentrations vary significantly. A range of 10-30 ug/mL  may be an effective concentration for many patients. However, some  are best treated at concentrations outside of this range. Acetaminophen concentrations >150 ug/mL at 4 hours after ingestion  and >50 ug/mL at 12 hours after ingestion are often associated with  toxic reactions.  Performed at [facility], [address] [place], [place] [address]   Ethanol     Status: Abnormal   Collection Time: 01/11/22 12:05 AM  Result Value Ref Range   Alcohol, Ethyl (B) 587 (HH) <10 mg/dL    Comment: CRITICAL RESULT CALLED TO, READ BACK BY AND VERIFIED WITH B. OLDLAND, RN, 0109 01/11/22, A. RAMSEY (NOTE) Lowest detectable limit for serum alcohol is 10 mg/dL.  For medical purposes only. Performed at [facility], [address] [place], [place] [address]   Salicylate level     Status: Abnormal   Collection Time: 01/11/22 12:05 AM  Result Value Ref Range   Salicylate Lvl <7.0 (L) 7.0 - 30.0 mg/dL    Comment: Performed at [facility], [address] [place], [place] [address]  Lactic acid, plasma     Status: Abnormal   Collection Time: 01/11/22 12:05 AM  Result Value Ref Range   Lactic Acid, Venous 2.5 (HH) 0.5 - 1.9 mmol/L    Comment: CRITICAL RESULT CALLED TO, READ BACK BY AND VERIFIED WITH  Darcella Cheshire, RN, 0109 01/11/22, Mervyn Skeeters RAMSEY Performed at [facility], [address], [place], [place] [address]   Protime-INR     Status: None   Collection Time: 01/11/22 12:05 AM  Result Value Ref Range   Prothrombin Time 12.1 11.4 - 15.2 seconds   INR 0.9 0.8 - 1.2    Comment: (NOTE) INR goal  varies based on device and disease states. Performed at [facility], [address] [place], [place] [address]   Magnesium     Status: None   Collection Time: 01/11/22 12:05 AM  Result Value Ref Range   Magnesium 2.3 1.7 - 2.4 mg/dL    Comment: Performed at [facility], [address], [place], [place] [address]  Urinalysis, Routine w reflex microscopic Urine, Catheterized     Status: Abnormal   Collection Time: 01/11/22 12:55 AM  Result Value Ref Range   Color, Urine YELLOW YELLOW   APPearance HAZY (A) CLEAR   Specific Gravity, Urine 1.035 (H) 1.005 - 1.030   pH 7.0 5.0 - 8.0   Glucose, UA NEGATIVE NEGATIVE mg/dL   Hgb urine dipstick SMALL (A) NEGATIVE   Bilirubin Urine NEGATIVE NEGATIVE   Ketones, ur NEGATIVE NEGATIVE mg/dL   Protein, ur 413 (A) NEGATIVE mg/dL   Nitrite NEGATIVE NEGATIVE   Leukocytes,Ua NEGATIVE NEGATIVE   RBC / HPF 0-5 0 - 5 RBC/hpf   WBC, UA 0-5 0 - 5 WBC/hpf   Bacteria, UA RARE (A) NONE SEEN   Squamous Epithelial / LPF 6-10 0 - 5   Mucus PRESENT     Comment: Performed at [facility], [address], [place], [place] [address]  Urine rapid drug screen (hosp performed)     Status: None   Collection Time: 01/11/22 12:56 AM  Result Value Ref Range   Opiates NONE DETECTED NONE DETECTED   Cocaine NONE DETECTED NONE DETECTED   Benzodiazepines NONE DETECTED NONE DETECTED   Amphetamines NONE DETECTED NONE DETECTED   Tetrahydrocannabinol NONE DETECTED NONE DETECTED   Barbiturates NONE DETECTED NONE DETECTED    Comment: (NOTE) DRUG SCREEN FOR MEDICAL PURPOSES ONLY.  IF CONFIRMATION IS NEEDED FOR ANY PURPOSE, NOTIFY LAB WITHIN 5  DAYS.  LOWEST DETECTABLE LIMITS FOR URINE DRUG SCREEN Drug Class                     Cutoff (ng/mL) Amphetamine and metabolites    1000 Barbiturate and metabolites    200 Benzodiazepine                 200 Tricyclics and metabolites     300 Opiates and metabolites        300 Cocaine and metabolites        300 THC                            50 Performed at [facility], [address] [place], [place] [address]   I-Stat arterial blood gas, ED     Status: Abnormal   Collection Time: 01/11/22 12:56 AM  Result Value Ref Range   pH, Arterial 7.582 (H) 7.35 - 7.45   pCO2 arterial 38.6 32 - 48 mmHg   pO2, Arterial 616 (H) 83 - 108 mmHg   Bicarbonate 36.9 (H) 20.0 - 28.0 mmol/L   TCO2 38 (H) 22 - 32 mmol/L   O2 Saturation 100 %   Acid-Base Excess 13.0 (H) 0.0 - 2.0 mmol/L   Sodium 135 135 - 145 mmol/L   Potassium 2.0 (LL) 3.5 - 5.1 mmol/L   Calcium, Ion 0.90 (L) 1.15 - 1.40 mmol/L   HCT 33.0 (L) 36.0 - 46.0 %   Hemoglobin 11.2 (L) 12.0 - 15.0 g/dL   Patient temperature 36.6 F    Collection site RADIAL, ALLEN'S TEST  ACCEPTABLE    Drawn by Operator    Sample type ARTERIAL    Comment NOTIFIED PHYSICIAN    CT Head Wo Contrast  Result Date: 01/11/2022 CLINICAL DATA:  Head trauma, abnormal mental status (Age 4-64y); Facial trauma, blunt; Polytrauma, blunt; Neck trauma, dangerous injury mechanism (Age 91-64y) EXAM: CT HEAD WITHOUT CONTRAST CT MAXILLOFACIAL WITHOUT CONTRAST CT CERVICAL SPINE WITHOUT CONTRAST CT CHEST, ABDOMEN AND PELVIS WITH CONTRAST TECHNIQUE: Contiguous axial images were obtained from the base of the skull through the vertex without intravenous contrast. Multidetector CT imaging of the maxillofacial structures was performed. Multiplanar CT image reconstructions were also generated. A small metallic BB was placed on the right temple in order to reliably differentiate right from left. Multidetector CT imaging of the cervical spine was performed without intravenous  contrast. Multiplanar CT image reconstructions were also generated. Multidetector CT imaging of the chest, abdomen and pelvis was performed following the standard protocol during bolus administration of intravenous contrast. RADIATION DOSE REDUCTION: This exam was performed according to the departmental dose-optimization program which includes automated exposure control, adjustment of the mA and/or kV according to patient size and/or use of iterative reconstruction technique. CONTRAST:  109mL OMNIPAQUE IOHEXOL 300 MG/ML  SOLN COMPARISON:  None Available. FINDINGS: CT HEAD FINDINGS Brain: Normal anatomic configuration. No abnormal intra or extra-axial mass lesion or fluid collection. No abnormal mass effect or midline shift. No evidence of acute intracranial hemorrhage or infarct. Ventricular size is normal. Cerebellum unremarkable. Vascular: Unremarkable Skull: Intact Other: Mastoid air cells and middle ear cavities are clear. CT MAXILLOFACIAL FINDINGS Osseous: No fracture or mandibular dislocation. No destructive process. Orbits: Negative. No traumatic or inflammatory finding. Sinuses: Moderate mucosal thickening within the right maxillary sinus. No air-fluid levels. Remaining paranasal sinuses are clear. Soft tissues: Negative. Endotracheal tube and orogastric tube are noted. CT CERVICAL SPINE FINDINGS Alignment: Normal. Skull base and vertebrae: Right cervical alignment is normal. The atlantodental interval is not widened. No acute fracture of the cervical spine. Vertebral body height is preserved. Soft tissues and spinal canal: No prevertebral fluid or swelling. No visible canal hematoma. Disc levels: Intervertebral disc heights are preserved. Vertebral body heights are preserved. Prevertebral soft tissues are not overtly thickened. Nasogastric tube and orogastric tube are partially visualized. Spinal canal is widely patent. No significant neuroforaminal narrowing. Other: None CT CHEST FINDINGS Cardiovascular: No  significant vascular findings. Normal heart size. No pericardial effusion. Mediastinum/Nodes: Endotracheal tube seen 3.6 cm above the carina. Nasogastric tube extends into the distal body of the stomach. Thyroid is unremarkable. No pathologic thoracic adenopathy. Esophagus is unremarkable. No pneumomediastinum. No mediastinal hematoma. Lungs/Pleura: Lungs are clear. No pleural effusion or pneumothorax. Musculoskeletal: No acute bone abnormality. CT ABDOMEN AND PELVIS FINDINGS Hepatobiliary: Cholelithiasis without pericholecystic inflammatory change. Liver unremarkable. No intra or extrahepatic biliary ductal dilation. Pancreas: Unremarkable Spleen: Unremarkable Adrenals/Urinary Tract: The adrenal glands are unremarkable. The kidneys are normal. Foley catheter balloon is seen within a decompressed bladder lumen. Stomach/Bowel: Stomach is within normal limits. Appendix appears normal. No evidence of bowel wall thickening, distention, or inflammatory changes. No free intraperitoneal gas or fluid. Vascular/Lymphatic: No significant vascular findings are present. No enlarged abdominal or pelvic lymph nodes. Reproductive: Uterus and bilateral adnexa are unremarkable. Other: No abdominal wall hernia or abnormality. No abdominopelvic ascites. Musculoskeletal: No acute or significant osseous findings. IMPRESSION: 1. No acute intracranial injury. No calvarial fracture. 2. No acute facial fracture. 3. Moderate right maxillary sinus disease 4. No acute fracture or listhesis of the cervical spine. 5. No  acute intrathoracic or intra-abdominal injury. 6. Endotracheal tube and orogastric tube in appropriate position. 7. Cholelithiasis. Electronically Signed   By: Helyn NumbersAshesh  Parikh M.D.   On: 01/11/2022 01:11   CT Maxillofacial WO CM  Result Date: 01/11/2022 CLINICAL DATA:  Head trauma, abnormal mental status (Age 39-64y); Facial trauma, blunt; Polytrauma, blunt; Neck trauma, dangerous injury mechanism (Age 39-64y) EXAM: CT HEAD  WITHOUT CONTRAST CT MAXILLOFACIAL WITHOUT CONTRAST CT CERVICAL SPINE WITHOUT CONTRAST CT CHEST, ABDOMEN AND PELVIS WITH CONTRAST TECHNIQUE: Contiguous axial images were obtained from the base of the skull through the vertex without intravenous contrast. Multidetector CT imaging of the maxillofacial structures was performed. Multiplanar CT image reconstructions were also generated. A small metallic BB was placed on the right temple in order to reliably differentiate right from left. Multidetector CT imaging of the cervical spine was performed without intravenous contrast. Multiplanar CT image reconstructions were also generated. Multidetector CT imaging of the chest, abdomen and pelvis was performed following the standard protocol during bolus administration of intravenous contrast. RADIATION DOSE REDUCTION: This exam was performed according to the departmental dose-optimization program which includes automated exposure control, adjustment of the mA and/or kV according to patient size and/or use of iterative reconstruction technique. CONTRAST:  80mL OMNIPAQUE IOHEXOL 300 MG/ML  SOLN COMPARISON:  None Available. FINDINGS: CT HEAD FINDINGS Brain: Normal anatomic configuration. No abnormal intra or extra-axial mass lesion or fluid collection. No abnormal mass effect or midline shift. No evidence of acute intracranial hemorrhage or infarct. Ventricular size is normal. Cerebellum unremarkable. Vascular: Unremarkable Skull: Intact Other: Mastoid air cells and middle ear cavities are clear. CT MAXILLOFACIAL FINDINGS Osseous: No fracture or mandibular dislocation. No destructive process. Orbits: Negative. No traumatic or inflammatory finding. Sinuses: Moderate mucosal thickening within the right maxillary sinus. No air-fluid levels. Remaining paranasal sinuses are clear. Soft tissues: Negative. Endotracheal tube and orogastric tube are noted. CT CERVICAL SPINE FINDINGS Alignment: Normal. Skull base and vertebrae: Right  cervical alignment is normal. The atlantodental interval is not widened. No acute fracture of the cervical spine. Vertebral body height is preserved. Soft tissues and spinal canal: No prevertebral fluid or swelling. No visible canal hematoma. Disc levels: Intervertebral disc heights are preserved. Vertebral body heights are preserved. Prevertebral soft tissues are not overtly thickened. Nasogastric tube and orogastric tube are partially visualized. Spinal canal is widely patent. No significant neuroforaminal narrowing. Other: None CT CHEST FINDINGS Cardiovascular: No significant vascular findings. Normal heart size. No pericardial effusion. Mediastinum/Nodes: Endotracheal tube seen 3.6 cm above the carina. Nasogastric tube extends into the distal body of the stomach. Thyroid is unremarkable. No pathologic thoracic adenopathy. Esophagus is unremarkable. No pneumomediastinum. No mediastinal hematoma. Lungs/Pleura: Lungs are clear. No pleural effusion or pneumothorax. Musculoskeletal: No acute bone abnormality. CT ABDOMEN AND PELVIS FINDINGS Hepatobiliary: Cholelithiasis without pericholecystic inflammatory change. Liver unremarkable. No intra or extrahepatic biliary ductal dilation. Pancreas: Unremarkable Spleen: Unremarkable Adrenals/Urinary Tract: The adrenal glands are unremarkable. The kidneys are normal. Foley catheter balloon is seen within a decompressed bladder lumen. Stomach/Bowel: Stomach is within normal limits. Appendix appears normal. No evidence of bowel wall thickening, distention, or inflammatory changes. No free intraperitoneal gas or fluid. Vascular/Lymphatic: No significant vascular findings are present. No enlarged abdominal or pelvic lymph nodes. Reproductive: Uterus and bilateral adnexa are unremarkable. Other: No abdominal wall hernia or abnormality. No abdominopelvic ascites. Musculoskeletal: No acute or significant osseous findings. IMPRESSION: 1. No acute intracranial injury. No calvarial  fracture. 2. No acute facial fracture. 3. Moderate right maxillary sinus disease 4.  No acute fracture or listhesis of the cervical spine. 5. No acute intrathoracic or intra-abdominal injury. 6. Endotracheal tube and orogastric tube in appropriate position. 7. Cholelithiasis. Electronically Signed   By: Helyn Numbers M.D.   On: 01/11/2022 01:11   DG Chest Portable 1 View  Result Date: 01/11/2022 CLINICAL DATA:  Intubation, aspiration.  Cardiac arrest. EXAM: PORTABLE CHEST 1 VIEW COMPARISON:  Radiograph 12/11/2021 FINDINGS: Endotracheal tube tip 3.8 cm in the carina. Tip and side port of the enteric tube below the diaphragm in the stomach. The heart is normal in size with normal mediastinal contours. No focal airspace disease. No pneumothorax or pleural effusion. Surgical hardware in the right clavicle. No acute rib fractures are seen. IMPRESSION: 1. Endotracheal tube tip 3.8 cm in the carina. Enteric tube in place. 2. No acute chest findings. Electronically Signed   By: Narda Rutherford M.D.   On: 01/11/2022 00:25   DG Abd Portable 1V  Result Date: 01/11/2022 CLINICAL DATA:  Cardiac arrest.  OG tube placement. EXAM: PORTABLE ABDOMEN - 1 VIEW COMPARISON:  CT 10/09/2021 FINDINGS: Tip and side port of the enteric tube below the diaphragm in the stomach. No bowel dilatation or evidence of obstruction. IMPRESSION: Tip and side port of the enteric tube below the diaphragm in the stomach. Electronically Signed   By: Narda Rutherford M.D.   On: 01/11/2022 00:24   DG Pelvis Portable  Result Date: 01/11/2022 CLINICAL DATA:  Fall, altered mental status.  Cardiac arrest. EXAM: PORTABLE PELVIS 1-2 VIEWS COMPARISON:  None Available. FINDINGS: The cortical margins of the bony pelvis are intact. No fracture. Pubic symphysis and sacroiliac joints are congruent. Both femoral heads are well-seated in the respective acetabula. IMPRESSION: No pelvic fracture. Electronically Signed   By: Narda Rutherford M.D.   On: 01/11/2022  00:23    Pending Labs Unresulted Labs (From admission, onward)     Start     Ordered   01/11/22 0058  CBC with Differential  Once,   STAT        01/11/22 0057   01/11/22 0057  Culture, blood (routine x 2)  BLOOD CULTURE X 2,   R      01/11/22 0056   01/11/22 0057  Urine Culture  Once,   URGENT       Question:  Indication  Answer:  Altered mental status (if no other cause identified)   01/11/22 0057   01/10/22 2347  Lactic acid, plasma  Now then every 2 hours,   R      01/10/22 2346            Vitals/Pain Today's Vitals   01/10/22 2351 01/11/22 0030 01/11/22 0045 01/11/22 0100  BP:  103/83 108/76 102/77  Pulse:  72 74 77  Resp:  18 19 (!) 22  Temp:  (!) 94.1 F (34.5 C) (!) 94.8 F (34.9 C) (!) 95 F (35 C)  SpO2:  100% 100% 100%  Height:  (1.778 m)       Isolation Precautions No active isolations  Medications Medications  etomidate (AMIDATE) injection (10 mg Intravenous Given 01/10/22 2349)  succinylcholine (ANECTINE) injection (80 mg Intravenous Given 01/10/22 2350)  propofol (DIPRIVAN) 1000 MG/100ML infusion (50 mcg/kg/min Intravenous Rate/Dose Change 01/11/22 0139)  potassium chloride (KLOR-CON) packet 80 mEq (has no administration in time range)  potassium chloride 10 mEq in 100 mL IVPB (has no administration in time range)  thiamine (B-1) injection 100 mg (has no administration in time range)  folic  acid (FOLVITE) tablet 1 mg (has no administration in time range)  multivitamin with minerals tablet 1 tablet (has no administration in time range)  sodium chloride 0.9 % bolus 1,000 mL (0 mLs Intravenous Stopped 01/11/22 0052)  thiamine 500mg  in normal saline (36ml) IVPB (0 mg Intravenous Stopped 01/11/22 0126)  iohexol (OMNIPAQUE) 300 MG/ML solution 80 mL (80 mLs Intravenous Contrast Given 01/11/22 0046)    Mobility : Ambulatory   R Recommendations: See Admitting Provider Note
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
## 2022-01-11 NOTE — Progress Notes (Signed)
RT NOTE:  Pt transported to CT without event.  

## 2022-01-12 ENCOUNTER — Inpatient Hospital Stay (HOSPITAL_COMMUNITY): Payer: Self-pay

## 2022-01-12 DIAGNOSIS — I469 Cardiac arrest, cause unspecified: Secondary | ICD-10-CM

## 2022-01-12 LAB — URINE CULTURE: Culture: 4000 — AB

## 2022-01-12 LAB — CBC
HCT: 28.1 % — ABNORMAL LOW (ref 36.0–46.0)
Hemoglobin: 9 g/dL — ABNORMAL LOW (ref 12.0–15.0)
MCH: 29.6 pg (ref 26.0–34.0)
MCHC: 32 g/dL (ref 30.0–36.0)
MCV: 92.4 fL (ref 80.0–100.0)
Platelets: 122 10*3/uL — ABNORMAL LOW (ref 150–400)
RBC: 3.04 MIL/uL — ABNORMAL LOW (ref 3.87–5.11)
RDW: 17.1 % — ABNORMAL HIGH (ref 11.5–15.5)
WBC: 3.3 10*3/uL — ABNORMAL LOW (ref 4.0–10.5)
nRBC: 0 % (ref 0.0–0.2)

## 2022-01-12 LAB — BASIC METABOLIC PANEL
Anion gap: 9 (ref 5–15)
BUN: 5 mg/dL — ABNORMAL LOW (ref 6–20)
CO2: 31 mmol/L (ref 22–32)
Calcium: 7.8 mg/dL — ABNORMAL LOW (ref 8.9–10.3)
Chloride: 93 mmol/L — ABNORMAL LOW (ref 98–111)
Creatinine, Ser: 0.72 mg/dL (ref 0.44–1.00)
GFR, Estimated: >60 mL/min (ref >60)
Glucose, Bld: 120 mg/dL — ABNORMAL HIGH (ref 70–99)
Potassium: 3.1 mmol/L — ABNORMAL LOW (ref 3.5–5.1)
Sodium: 133 mmol/L — ABNORMAL LOW (ref 135–145)

## 2022-01-12 LAB — ECHOCARDIOGRAM COMPLETE
AR max vel: 2.54 cm2
AV Area VTI: 2.48 cm2
AV Area mean vel: 2.26 cm2
AV Mean grad: 3 mmHg
AV Peak grad: 5 mmHg
Ao pk vel: 1.12 m/s
Area-P 1/2: 2.96 cm2
Calc EF: 57.8 %
Height: 70 in
MV VTI: 2.47 cm2
S' Lateral: 2.6 cm
Single Plane A2C EF: 61.5 %
Single Plane A4C EF: 56.6 %
Weight: 2098.78 oz

## 2022-01-12 LAB — POCT I-STAT EG7
Acid-Base Excess: 14 mmol/L — ABNORMAL HIGH (ref 0.0–2.0)
Bicarbonate: 40 mmol/L — ABNORMAL HIGH (ref 20.0–28.0)
Calcium, Ion: 0.88 mmol/L — CL (ref 1.15–1.40)
HCT: 44 % (ref 36.0–46.0)
Hemoglobin: 15 g/dL (ref 12.0–15.0)
O2 Saturation: 90 %
Potassium: 3 mmol/L — ABNORMAL LOW (ref 3.5–5.1)
Sodium: 135 mmol/L (ref 135–145)
TCO2: 42 mmol/L — ABNORMAL HIGH (ref 22–32)
pCO2, Ven: 51.4 mmHg (ref 44–60)
pH, Ven: 7.499 — ABNORMAL HIGH (ref 7.25–7.43)
pO2, Ven: 54 mmHg — ABNORMAL HIGH (ref 32–45)

## 2022-01-12 LAB — POCT I-STAT, CHEM 8
BUN: 12 mg/dL (ref 6–20)
Calcium, Ion: 0.78 mmol/L — CL (ref 1.15–1.40)
Chloride: 88 mmol/L — ABNORMAL LOW (ref 98–111)
Creatinine, Ser: 1.3 mg/dL — ABNORMAL HIGH (ref 0.44–1.00)
Glucose, Bld: 111 mg/dL — ABNORMAL HIGH (ref 70–99)
HCT: 42 % (ref 36.0–46.0)
Hemoglobin: 14.3 g/dL (ref 12.0–15.0)
Potassium: 3 mmol/L — ABNORMAL LOW (ref 3.5–5.1)
Sodium: 135 mmol/L (ref 135–145)
TCO2: 35 mmol/L — ABNORMAL HIGH (ref 22–32)

## 2022-01-12 LAB — RAPID HIV SCREEN (HIV 1/2 AB+AG)
HIV 1/2 Antibodies: NONREACTIVE
HIV-1 P24 Antigen - HIV24: NONREACTIVE

## 2022-01-12 LAB — PHOSPHORUS: Phosphorus: 2.9 mg/dL (ref 2.5–4.6)

## 2022-01-12 LAB — CK: Total CK: 63 U/L (ref 38–234)

## 2022-01-12 LAB — TRIGLYCERIDES: Triglycerides: 86 mg/dL (ref <150)

## 2022-01-12 LAB — MAGNESIUM: Magnesium: 1.8 mg/dL (ref 1.7–2.4)

## 2022-01-12 LAB — I-STAT BETA HCG BLOOD, ED (NOT ORDERABLE): I-stat hCG, quantitative: <5 m[IU]/mL (ref <5)

## 2022-01-12 LAB — GLUCOSE, CAPILLARY: Glucose-Capillary: 99 mg/dL (ref 70–99)

## 2022-01-12 MED ORDER — PHENOBARBITAL SODIUM 65 MG/ML IJ SOLN: 32.5000 mg | Freq: Every day | INTRAMUSCULAR | Status: DC

## 2022-01-12 MED ORDER — PHENOBARBITAL SODIUM 130 MG/ML IJ SOLN
130.0000 mg | Freq: Three times a day (TID) | INTRAMUSCULAR | Status: DC
Start: 2022-01-12 — End: 2022-01-12

## 2022-01-12 MED ORDER — PHENOBARBITAL SODIUM 65 MG/ML IJ SOLN: 32.5000 mg | Freq: Three times a day (TID) | INTRAMUSCULAR | Status: DC

## 2022-01-12 MED ORDER — MAGNESIUM SULFATE 2 GM/50ML IV SOLN
2.0000 g | Freq: Once | INTRAVENOUS | Status: AC
  Administered 2022-01-12: 2 g via INTRAVENOUS
  Filled 2022-01-12: qty 50

## 2022-01-12 MED ORDER — MELATONIN 3 MG PO TABS
3.0000 mg | ORAL_TABLET | Freq: Every evening | ORAL | Status: DC | PRN
  Administered 2022-01-12 – 2022-01-13 (×2): 3 mg via ORAL
  Filled 2022-01-12 (×2): qty 1

## 2022-01-12 MED ORDER — DOCUSATE SODIUM 50 MG/5ML PO LIQD: 100.0000 mg | Freq: Two times a day (BID) | ORAL | Status: DC | PRN

## 2022-01-12 MED ORDER — PHENOBARBITAL 32.4 MG PO TABS
97.2000 mg | ORAL_TABLET | Freq: Three times a day (TID) | ORAL | Status: AC
  Administered 2022-01-12 – 2022-01-13 (×6): 97.2 mg via ORAL
  Filled 2022-01-12 (×6): qty 3

## 2022-01-12 MED ORDER — PHENOBARBITAL SODIUM 65 MG/ML IJ SOLN
65.0000 mg | Freq: Three times a day (TID) | INTRAMUSCULAR | Status: DC
Start: 2022-01-14 — End: 2022-01-12

## 2022-01-12 MED ORDER — PHENOBARBITAL 32.4 MG PO TABS
64.8000 mg | ORAL_TABLET | Freq: Three times a day (TID) | ORAL | Status: DC
  Administered 2022-01-14: 64.8 mg via ORAL
  Filled 2022-01-12: qty 2

## 2022-01-12 MED ORDER — PHENOBARBITAL 32.4 MG PO TABS: 32.4000 mg | ORAL_TABLET | Freq: Every day | ORAL | Status: DC

## 2022-01-12 MED ORDER — POLYETHYLENE GLYCOL 3350 17 G PO PACK: 17.0000 g | PACK | Freq: Every day | ORAL | Status: DC | PRN

## 2022-01-12 MED ORDER — PHENOBARBITAL 32.4 MG PO TABS: 32.4000 mg | ORAL_TABLET | Freq: Three times a day (TID) | ORAL | Status: DC

## 2022-01-12 MED ORDER — POTASSIUM CHLORIDE CRYS ER 20 MEQ PO TBCR
40.0000 meq | EXTENDED_RELEASE_TABLET | Freq: Two times a day (BID) | ORAL | Status: AC
  Administered 2022-01-12 (×2): 40 meq via ORAL
  Filled 2022-01-12 (×2): qty 2

## 2022-01-12 NOTE — Progress Notes (Addendum)
01/12/2022 I saw and evaluated the patient. Discussed with resident and agree with resident's findings and plan as documented in the resident's note.  I have seen and evaluated the patient for post arrest care  S:  No events, some rib soreness.  O: Blood pressure 112/79, pulse 76, temperature 98.2 F (36.8 C), temperature source Axillary, resp. rate 16, height 5\' 10"  (1.778 m), weight 59.5 kg, SpO2 100 %.  No distress Lungs clear Abd soft, heart sounds regular Thought processes linear, no SI  A:  Post cardiac arrest with long QT, abnormal electrolytes and alcohol intoxication question arrhythmia vs. Resp event  Hypo-K Hypo Mg  High risk alcohol withdrawal  P:  - Phenobarbital taper - CIWA driven ativan - MVI, thiamine - TOC substance abuse - PT/OT - f/u echo for completeness sake - parents/patient updated at bedside - Transfer to progressive, appreciate TRH taking over starting 01/13/22  01/15/22 MD [facility] Prefer epic messenger for cross cover needs If after hours, please call E-link

## 2022-01-12 NOTE — Progress Notes (Signed)
*  PRELIMINARY RESULTS* Echocardiogram 2D Echocardiogram has been performed.  Carolyne Fiscal 01/12/2022, 1:38 PM

## 2022-01-12 NOTE — Progress Notes (Signed)
eLink Physician-Brief Progress Note Patient Name: Sheila Graves DOB: 09/15/1980 MRN: [id number]   Date of Service  01/12/2022  HPI/Events of Note  Patient is requesting a sleep aid.  eICU Interventions  PRN Melatonin 3 mg Q HS ordered.        Migdalia Dk 01/12/2022, 9:41 PM

## 2022-01-13 LAB — CBC
HCT: 29.9 % — ABNORMAL LOW (ref 36.0–46.0)
Hemoglobin: 10 g/dL — ABNORMAL LOW (ref 12.0–15.0)
MCH: 30.4 pg (ref 26.0–34.0)
MCHC: 33.4 g/dL (ref 30.0–36.0)
MCV: 90.9 fL (ref 80.0–100.0)
Platelets: 136 10*3/uL — ABNORMAL LOW (ref 150–400)
RBC: 3.29 MIL/uL — ABNORMAL LOW (ref 3.87–5.11)
RDW: 16 % — ABNORMAL HIGH (ref 11.5–15.5)
WBC: 2.7 10*3/uL — ABNORMAL LOW (ref 4.0–10.5)
nRBC: 0 % (ref 0.0–0.2)

## 2022-01-13 LAB — BASIC METABOLIC PANEL
Anion gap: 12 (ref 5–15)
BUN: 7 mg/dL (ref 6–20)
CO2: 25 mmol/L (ref 22–32)
Calcium: 8.3 mg/dL — ABNORMAL LOW (ref 8.9–10.3)
Chloride: 100 mmol/L (ref 98–111)
Creatinine, Ser: 0.63 mg/dL (ref 0.44–1.00)
GFR, Estimated: >60 mL/min (ref >60)
Glucose, Bld: 88 mg/dL (ref 70–99)
Potassium: 3.4 mmol/L — ABNORMAL LOW (ref 3.5–5.1)
Sodium: 137 mmol/L (ref 135–145)

## 2022-01-13 MED ORDER — POTASSIUM CHLORIDE CRYS ER 20 MEQ PO TBCR
40.0000 meq | EXTENDED_RELEASE_TABLET | Freq: Once | ORAL | Status: AC
  Administered 2022-01-13: 40 meq via ORAL
  Filled 2022-01-13: qty 2

## 2022-01-13 MED ORDER — LIDOCAINE 5 % EX PTCH
1.0000 | MEDICATED_PATCH | CUTANEOUS | Status: DC
  Administered 2022-01-13: 1 via TRANSDERMAL
  Filled 2022-01-13: qty 1
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
## 2022-01-13 NOTE — Progress Notes (Signed)
[facility] ADULT ICU REPLACEMENT PROTOCOL   The patient does apply for the [facility] Adult ICU Electrolyte Replacment Protocol based on the criteria listed below:   1.Exclusion criteria: TCTS patients, ECMO patients, and Dialysis patients 2. Is GFR >/= 30 ml/min? Yes.    Patient's GFR today is >60 3. Is SCr </= 2? Yes.   Patient's SCr is 0.63 mg/dL 4. Did SCr increase >/= 0.5 in 24 hours? No. 5.Pt's weight >40kg  Yes.   6. Abnormal electrolyte(s): K 3.4  7. Electrolytes replaced per protocol   Ardelle Park 01/13/2022 5:11 AM

## 2022-01-14 LAB — CBC
HCT: 30.7 % — ABNORMAL LOW (ref 36.0–46.0)
Hemoglobin: 10.1 g/dL — ABNORMAL LOW (ref 12.0–15.0)
MCH: 30.1 pg (ref 26.0–34.0)
MCHC: 32.9 g/dL (ref 30.0–36.0)
MCV: 91.4 fL (ref 80.0–100.0)
Platelets: 177 10*3/uL (ref 150–400)
RBC: 3.36 MIL/uL — ABNORMAL LOW (ref 3.87–5.11)
RDW: 16.6 % — ABNORMAL HIGH (ref 11.5–15.5)
WBC: 2.8 10*3/uL — ABNORMAL LOW (ref 4.0–10.5)
nRBC: 0 % (ref 0.0–0.2)

## 2022-01-14 LAB — BASIC METABOLIC PANEL
Anion gap: 9 (ref 5–15)
BUN: 8 mg/dL (ref 6–20)
CO2: 28 mmol/L (ref 22–32)
Calcium: 8.7 mg/dL — ABNORMAL LOW (ref 8.9–10.3)
Chloride: 102 mmol/L (ref 98–111)
Creatinine, Ser: 0.68 mg/dL (ref 0.44–1.00)
GFR, Estimated: >60 mL/min (ref >60)
Glucose, Bld: 96 mg/dL (ref 70–99)
Potassium: 4.6 mmol/L (ref 3.5–5.1)
Sodium: 139 mmol/L (ref 135–145)

## 2022-01-14 MED ORDER — ADULT MULTIVITAMIN W/MINERALS CH: 1.0000 | ORAL_TABLET | Freq: Every day | ORAL | 0 refills | Status: AC

## 2022-01-14 MED ORDER — PHENOBARBITAL 32.4 MG PO TABS: ORAL_TABLET | ORAL | 0 refills | Status: AC

## 2022-01-14 NOTE — Progress Notes (Signed)
Discharge papers and medication report given to patient and patient's mom. PIVs removed, patient walking out with mom now.  Darrold Span

## 2022-01-14 NOTE — Discharge Summary (Signed)
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  She drinks about 1 gallon of vodka daily and she was witnessed to collapse by her friend who did CPR.  Was intubated on arrival to the ER and admitted to ICU.  Skeletal survey was negative.  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 Cardiac arrest was thought to be in the setting of alcohol intoxication and collapse, she had chest compressions done by her parent.  Rhythm abnormalities on hospital monitoring.  Echocardiogram was normal. Patient was treated with multivitamins and phenobarbital taper to prevent from alcohol withdrawal, she is reasonably stabilize now, will continue phenobarbital taper for additional 6 days. Patient was extensively counseled, resources provided to quit drinking alcohol. Patient is going home with her parents to start new way of life. Electrolytes were aggressively replaced and adequate today. Stable for discharge.    Discharge Diagnoses:  Principal Problem:   Cardiac arrest [facility])    Discharge Instructions  Discharge Instructions     Diet general   Complete by: As directed    Increase activity slowly   Complete by: As directed       Allergies as of 01/14/2022   No Known  Allergies      Medication List     STOP taking these medications    aspirin-acetaminophen-caffeine 250-250-65 MG tablet Commonly known as: EXCEDRIN MIGRAINE   ferrous sulfate 325 (65 FE) MG tablet   folic acid 1 MG tablet Commonly known as: FOLVITE   potassium chloride 10 MEQ tablet Commonly known as: KLOR-CON   thiamine 100 MG tablet Commonly known as: Vitamin B-1       TAKE these medications    multivitamin with minerals Tabs tablet Take 1 tablet by mouth daily.   PHENobarbital 32.4 MG tablet Commonly known as: LUMINAL 2 tabs 3 times daily for 2 days 1 tab 3 times daily for 2 days 1 tab daily for 2 days        No Known Allergies  Consultations: PCCM   Procedures/Studies: ECHOCARDIOGRAM COMPLETE  Result Date: 01/12/2022    ECHOCARDIOGRAM REPORT   Patient Name:   Sheila Graves Date of Exam: 01/12/2022 Medical Rec #:  [id number]   Height:       70.0 in Accession #:    [id number]  Weight:       131.2 lb Date of Birth:  [birth year]   BSA:          1.745 m Patient Age:    40 years    BP:           106/78 mmHg Patient Gender: F           HR:           71 bpm. Exam Location:  Inpatient Procedure: 2D Echo, Cardiac Doppler and Color Doppler Indications:    Cardiac arrest  History:        Patient  has prior history of Echocardiogram examinations, most                 recent 11/12/2021. Acute MI; Abnormal ECG.  Sonographer:    Mikki Harbor Referring Phys: [id number] Ivor Costa MEIER IMPRESSIONS  1. Left ventricular ejection fraction, by estimation, is 55 to 60%. The left ventricle has normal function. The left ventricle has no regional wall motion abnormalities. Left ventricular diastolic parameters were normal.  2. Right ventricular systolic function is normal. The right ventricular size is normal. There is normal pulmonary artery systolic pressure.  3. The mitral valve is normal in structure. No evidence of mitral valve regurgitation. No evidence of mitral stenosis.  4. The aortic  valve is tricuspid. Aortic valve regurgitation is not visualized. No aortic stenosis is present.  5. The inferior vena cava is normal in size with greater than 50% respiratory variability, suggesting right atrial pressure of 3 mmHg. Comparison(s): No significant change from prior study. Conclusion(s)/Recommendation(s): Normal biventricular function without evidence of hemodynamically significant valvular heart disease. FINDINGS  Left Ventricle: Left ventricular ejection fraction, by estimation, is 55 to 60%. The left ventricle has normal function. The left ventricle has no regional wall motion abnormalities. The left ventricular internal cavity size was normal in size. There is  no left ventricular hypertrophy. Left ventricular diastolic parameters were normal. Right Ventricle: The right ventricular size is normal. No increase in right ventricular wall thickness. Right ventricular systolic function is normal. There is normal pulmonary artery systolic pressure. The tricuspid regurgitant velocity is 2.22 m/s, and  with an assumed right atrial pressure of 3 mmHg, the estimated right ventricular systolic pressure is 22.7 mmHg. Left Atrium: Left atrial size was normal in size. Right Atrium: Right atrial size was normal in size. Pericardium: There is no evidence of pericardial effusion. Mitral Valve: The mitral valve is normal in structure. No evidence of mitral valve regurgitation. No evidence of mitral valve stenosis. MV peak gradient, 3.2 mmHg. The mean mitral valve gradient is 1.0 mmHg. Tricuspid Valve: The tricuspid valve is normal in structure. Tricuspid valve regurgitation is trivial. No evidence of tricuspid stenosis. Aortic Valve: The aortic valve is tricuspid. Aortic valve regurgitation is not visualized. No aortic stenosis is present. Aortic valve mean gradient measures 3.0 mmHg. Aortic valve peak gradient measures 5.0 mmHg. Aortic valve area, by VTI measures 2.48 cm. Pulmonic Valve: The pulmonic valve was not  well visualized. Pulmonic valve regurgitation is not visualized. No evidence of pulmonic stenosis. Aorta: The aortic root, ascending aorta, aortic arch and descending aorta are all structurally normal, with no evidence of dilitation or obstruction. Venous: The inferior vena cava is normal in size with greater than 50% respiratory variability, suggesting right atrial pressure of 3 mmHg. IAS/Shunts: The atrial septum is grossly normal.  LEFT VENTRICLE PLAX 2D LVIDd:         4.70 cm     Diastology LVIDs:         2.60 cm     LV e' medial:    14.40 cm/s LV PW:         0.80 cm     LV E/e' medial:  4.1 LV IVS:        0.70 cm     LV e' lateral:   24.90 cm/s LVOT diam:     2.00 cm     LV E/e' lateral: 2.4 LV SV:         57 LV SV Index:   33 LVOT Area:  3.14 cm  LV Volumes (MOD) LV vol d, MOD A2C: 55.1 ml LV vol d, MOD A4C: 64.7 ml LV vol s, MOD A2C: 21.2 ml LV vol s, MOD A4C: 28.1 ml LV SV MOD A2C:     33.9 ml LV SV MOD A4C:     64.7 ml LV SV MOD BP:      34.4 ml RIGHT VENTRICLE RV Basal diam:  3.35 cm RV Mid diam:    2.80 cm RV S prime:     20.30 cm/s TAPSE (M-mode): 2.6 cm LEFT ATRIUM             Index        RIGHT ATRIUM           Index LA diam:        3.10 cm 1.78 cm/m   RA Area:     14.50 cm LA Vol (A2C):   39.9 ml 22.87 ml/m  RA Volume:   34.90 ml  20.00 ml/m LA Vol (A4C):   28.9 ml 16.56 ml/m LA Biplane Vol: 37.2 ml 21.32 ml/m  AORTIC VALVE                    PULMONIC VALVE AV Area (Vmax):    2.54 cm     PV Vmax:       0.85 m/s AV Area (Vmean):   2.26 cm     PV Peak grad:  2.9 mmHg AV Area (VTI):     2.48 cm AV Vmax:           112.00 cm/s AV Vmean:          73.100 cm/s AV VTI:            0.231 m AV Peak Grad:      5.0 mmHg AV Mean Grad:      3.0 mmHg LVOT Vmax:         90.70 cm/s LVOT Vmean:        52.500 cm/s LVOT VTI:          0.182 m LVOT/AV VTI ratio: 0.79  AORTA Ao Root diam: 2.80 cm MITRAL VALVE               TRICUSPID VALVE MV Area (PHT): 2.96 cm    TR Peak grad:   19.7 mmHg MV Area VTI:   2.47  cm    TR Vmax:        222.00 cm/s MV Peak grad:  3.2 mmHg MV Mean grad:  1.0 mmHg    SHUNTS MV Vmax:       0.89 m/s    Systemic VTI:  0.18 m MV Vmean:      47.8 cm/s   Systemic Diam: 2.00 cm MV Decel Time: 256 msec MV E velocity: 58.90 cm/s MV A velocity: 39.00 cm/s MV E/A ratio:  1.51 Jodelle RedBridgette Christopher MD Electronically signed by Jodelle RedBridgette Christopher MD Signature Date/Time: 01/12/2022/5:50:09 PM    Final    CT Head Wo Contrast  Result Date: 01/11/2022 CLINICAL DATA:  Head trauma, abnormal mental status (Age 39-64y); Facial trauma, blunt; Polytrauma, blunt; Neck trauma, dangerous injury mechanism (Age 39-64y) EXAM: CT HEAD WITHOUT CONTRAST CT MAXILLOFACIAL WITHOUT CONTRAST CT CERVICAL SPINE WITHOUT CONTRAST CT CHEST, ABDOMEN AND PELVIS WITH CONTRAST TECHNIQUE: Contiguous axial images were obtained from the base of the skull through the vertex without intravenous contrast. Multidetector CT imaging of the maxillofacial structures was performed. Multiplanar CT image reconstructions were also generated. A small metallic  BB was placed on the right temple in order to reliably differentiate right from left. Multidetector CT imaging of the cervical spine was performed without intravenous contrast. Multiplanar CT image reconstructions were also generated. Multidetector CT imaging of the chest, abdomen and pelvis was performed following the standard protocol during bolus administration of intravenous contrast. RADIATION DOSE REDUCTION: This exam was performed according to the departmental dose-optimization program which includes automated exposure control, adjustment of the mA and/or kV according to patient size and/or use of iterative reconstruction technique. CONTRAST:  71mL OMNIPAQUE IOHEXOL 300 MG/ML  SOLN COMPARISON:  None Available. FINDINGS: CT HEAD FINDINGS Brain: Normal anatomic configuration. No abnormal intra or extra-axial mass lesion or fluid collection. No abnormal mass effect or midline shift. No  evidence of acute intracranial hemorrhage or infarct. Ventricular size is normal. Cerebellum unremarkable. Vascular: Unremarkable Skull: Intact Other: Mastoid air cells and middle ear cavities are clear. CT MAXILLOFACIAL FINDINGS Osseous: No fracture or mandibular dislocation. No destructive process. Orbits: Negative. No traumatic or inflammatory finding. Sinuses: Moderate mucosal thickening within the right maxillary sinus. No air-fluid levels. Remaining paranasal sinuses are clear. Soft tissues: Negative. Endotracheal tube and orogastric tube are noted. CT CERVICAL SPINE FINDINGS Alignment: Normal. Skull base and vertebrae: Right cervical alignment is normal. The atlantodental interval is not widened. No acute fracture of the cervical spine. Vertebral body height is preserved. Soft tissues and spinal canal: No prevertebral fluid or swelling. No visible canal hematoma. Disc levels: Intervertebral disc heights are preserved. Vertebral body heights are preserved. Prevertebral soft tissues are not overtly thickened. Nasogastric tube and orogastric tube are partially visualized. Spinal canal is widely patent. No significant neuroforaminal narrowing. Other: None CT CHEST FINDINGS Cardiovascular: No significant vascular findings. Normal heart size. No pericardial effusion. Mediastinum/Nodes: Endotracheal tube seen 3.6 cm above the carina. Nasogastric tube extends into the distal body of the stomach. Thyroid is unremarkable. No pathologic thoracic adenopathy. Esophagus is unremarkable. No pneumomediastinum. No mediastinal hematoma. Lungs/Pleura: Lungs are clear. No pleural effusion or pneumothorax. Musculoskeletal: No acute bone abnormality. CT ABDOMEN AND PELVIS FINDINGS Hepatobiliary: Cholelithiasis without pericholecystic inflammatory change. Liver unremarkable. No intra or extrahepatic biliary ductal dilation. Pancreas: Unremarkable Spleen: Unremarkable Adrenals/Urinary Tract: The adrenal glands are unremarkable. The  kidneys are normal. Foley catheter balloon is seen within a decompressed bladder lumen. Stomach/Bowel: Stomach is within normal limits. Appendix appears normal. No evidence of bowel wall thickening, distention, or inflammatory changes. No free intraperitoneal gas or fluid. Vascular/Lymphatic: No significant vascular findings are present. No enlarged abdominal or pelvic lymph nodes. Reproductive: Uterus and bilateral adnexa are unremarkable. Other: No abdominal wall hernia or abnormality. No abdominopelvic ascites. Musculoskeletal: No acute or significant osseous findings. IMPRESSION: 1. No acute intracranial injury. No calvarial fracture. 2. No acute facial fracture. 3. Moderate right maxillary sinus disease 4. No acute fracture or listhesis of the cervical spine. 5. No acute intrathoracic or intra-abdominal injury. 6. Endotracheal tube and orogastric tube in appropriate position. 7. Cholelithiasis. Electronically Signed   By: Helyn Numbers M.D.   On: 01/11/2022 01:11   CT Cervical Spine Wo Contrast  Result Date: 01/11/2022 CLINICAL DATA:  Head trauma, abnormal mental status (Age 74-64y); Facial trauma, blunt; Polytrauma, blunt; Neck trauma, dangerous injury mechanism (Age 55-64y) EXAM: CT HEAD WITHOUT CONTRAST CT MAXILLOFACIAL WITHOUT CONTRAST CT CERVICAL SPINE WITHOUT CONTRAST CT CHEST, ABDOMEN AND PELVIS WITH CONTRAST TECHNIQUE: Contiguous axial images were obtained from the base of the skull through the vertex without intravenous contrast. Multidetector CT imaging of the maxillofacial structures was  performed. Multiplanar CT image reconstructions were also generated. A small metallic BB was placed on the right temple in order to reliably differentiate right from left. Multidetector CT imaging of the cervical spine was performed without intravenous contrast. Multiplanar CT image reconstructions were also generated. Multidetector CT imaging of the chest, abdomen and pelvis was performed following the standard  protocol during bolus administration of intravenous contrast. RADIATION DOSE REDUCTION: This exam was performed according to the departmental dose-optimization program which includes automated exposure control, adjustment of the mA and/or kV according to patient size and/or use of iterative reconstruction technique. CONTRAST:  80mL OMNIPAQUE IOHEXOL 300 MG/ML  SOLN COMPARISON:  None Available. FINDINGS: CT HEAD FINDINGS Brain: Normal anatomic configuration. No abnormal intra or extra-axial mass lesion or fluid collection. No abnormal mass effect or midline shift. No evidence of acute intracranial hemorrhage or infarct. Ventricular size is normal. Cerebellum unremarkable. Vascular: Unremarkable Skull: Intact Other: Mastoid air cells and middle ear cavities are clear. CT MAXILLOFACIAL FINDINGS Osseous: No fracture or mandibular dislocation. No destructive process. Orbits: Negative. No traumatic or inflammatory finding. Sinuses: Moderate mucosal thickening within the right maxillary sinus. No air-fluid levels. Remaining paranasal sinuses are clear. Soft tissues: Negative. Endotracheal tube and orogastric tube are noted. CT CERVICAL SPINE FINDINGS Alignment: Normal. Skull base and vertebrae: Right cervical alignment is normal. The atlantodental interval is not widened. No acute fracture of the cervical spine. Vertebral body height is preserved. Soft tissues and spinal canal: No prevertebral fluid or swelling. No visible canal hematoma. Disc levels: Intervertebral disc heights are preserved. Vertebral body heights are preserved. Prevertebral soft tissues are not overtly thickened. Nasogastric tube and orogastric tube are partially visualized. Spinal canal is widely patent. No significant neuroforaminal narrowing. Other: None CT CHEST FINDINGS Cardiovascular: No significant vascular findings. Normal heart size. No pericardial effusion. Mediastinum/Nodes: Endotracheal tube seen 3.6 cm above the carina. Nasogastric tube  extends into the distal body of the stomach. Thyroid is unremarkable. No pathologic thoracic adenopathy. Esophagus is unremarkable. No pneumomediastinum. No mediastinal hematoma. Lungs/Pleura: Lungs are clear. No pleural effusion or pneumothorax. Musculoskeletal: No acute bone abnormality. CT ABDOMEN AND PELVIS FINDINGS Hepatobiliary: Cholelithiasis without pericholecystic inflammatory change. Liver unremarkable. No intra or extrahepatic biliary ductal dilation. Pancreas: Unremarkable Spleen: Unremarkable Adrenals/Urinary Tract: The adrenal glands are unremarkable. The kidneys are normal. Foley catheter balloon is seen within a decompressed bladder lumen. Stomach/Bowel: Stomach is within normal limits. Appendix appears normal. No evidence of bowel wall thickening, distention, or inflammatory changes. No free intraperitoneal gas or fluid. Vascular/Lymphatic: No significant vascular findings are present. No enlarged abdominal or pelvic lymph nodes. Reproductive: Uterus and bilateral adnexa are unremarkable. Other: No abdominal wall hernia or abnormality. No abdominopelvic ascites. Musculoskeletal: No acute or significant osseous findings. IMPRESSION: 1. No acute intracranial injury. No calvarial fracture. 2. No acute facial fracture. 3. Moderate right maxillary sinus disease 4. No acute fracture or listhesis of the cervical spine. 5. No acute intrathoracic or intra-abdominal injury. 6. Endotracheal tube and orogastric tube in appropriate position. 7. Cholelithiasis. Electronically Signed   By: Helyn Numbers M.D.   On: 01/11/2022 01:11   CT Maxillofacial WO CM  Result Date: 01/11/2022 CLINICAL DATA:  Head trauma, abnormal mental status (Age 23-64y); Facial trauma, blunt; Polytrauma, blunt; Neck trauma, dangerous injury mechanism (Age 22-64y) EXAM: CT HEAD WITHOUT CONTRAST CT MAXILLOFACIAL WITHOUT CONTRAST CT CERVICAL SPINE WITHOUT CONTRAST CT CHEST, ABDOMEN AND PELVIS WITH CONTRAST TECHNIQUE: Contiguous axial  images were obtained from the base of the skull through the vertex  without intravenous contrast. Multidetector CT imaging of the maxillofacial structures was performed. Multiplanar CT image reconstructions were also generated. A small metallic BB was placed on the right temple in order to reliably differentiate right from left. Multidetector CT imaging of the cervical spine was performed without intravenous contrast. Multiplanar CT image reconstructions were also generated. Multidetector CT imaging of the chest, abdomen and pelvis was performed following the standard protocol during bolus administration of intravenous contrast. RADIATION DOSE REDUCTION: This exam was performed according to the departmental dose-optimization program which includes automated exposure control, adjustment of the mA and/or kV according to patient size and/or use of iterative reconstruction technique. CONTRAST:  80mL OMNIPAQUE IOHEXOL 300 MG/ML  SOLN COMPARISON:  None Available. FINDINGS: CT HEAD FINDINGS Brain: Normal anatomic configuration. No abnormal intra or extra-axial mass lesion or fluid collection. No abnormal mass effect or midline shift. No evidence of acute intracranial hemorrhage or infarct. Ventricular size is normal. Cerebellum unremarkable. Vascular: Unremarkable Skull: Intact Other: Mastoid air cells and middle ear cavities are clear. CT MAXILLOFACIAL FINDINGS Osseous: No fracture or mandibular dislocation. No destructive process. Orbits: Negative. No traumatic or inflammatory finding. Sinuses: Moderate mucosal thickening within the right maxillary sinus. No air-fluid levels. Remaining paranasal sinuses are clear. Soft tissues: Negative. Endotracheal tube and orogastric tube are noted. CT CERVICAL SPINE FINDINGS Alignment: Normal. Skull base and vertebrae: Right cervical alignment is normal. The atlantodental interval is not widened. No acute fracture of the cervical spine. Vertebral body height is preserved. Soft tissues  and spinal canal: No prevertebral fluid or swelling. No visible canal hematoma. Disc levels: Intervertebral disc heights are preserved. Vertebral body heights are preserved. Prevertebral soft tissues are not overtly thickened. Nasogastric tube and orogastric tube are partially visualized. Spinal canal is widely patent. No significant neuroforaminal narrowing. Other: None CT CHEST FINDINGS Cardiovascular: No significant vascular findings. Normal heart size. No pericardial effusion. Mediastinum/Nodes: Endotracheal tube seen 3.6 cm above the carina. Nasogastric tube extends into the distal body of the stomach. Thyroid is unremarkable. No pathologic thoracic adenopathy. Esophagus is unremarkable. No pneumomediastinum. No mediastinal hematoma. Lungs/Pleura: Lungs are clear. No pleural effusion or pneumothorax. Musculoskeletal: No acute bone abnormality. CT ABDOMEN AND PELVIS FINDINGS Hepatobiliary: Cholelithiasis without pericholecystic inflammatory change. Liver unremarkable. No intra or extrahepatic biliary ductal dilation. Pancreas: Unremarkable Spleen: Unremarkable Adrenals/Urinary Tract: The adrenal glands are unremarkable. The kidneys are normal. Foley catheter balloon is seen within a decompressed bladder lumen. Stomach/Bowel: Stomach is within normal limits. Appendix appears normal. No evidence of bowel wall thickening, distention, or inflammatory changes. No free intraperitoneal gas or fluid. Vascular/Lymphatic: No significant vascular findings are present. No enlarged abdominal or pelvic lymph nodes. Reproductive: Uterus and bilateral adnexa are unremarkable. Other: No abdominal wall hernia or abnormality. No abdominopelvic ascites. Musculoskeletal: No acute or significant osseous findings. IMPRESSION: 1. No acute intracranial injury. No calvarial fracture. 2. No acute facial fracture. 3. Moderate right maxillary sinus disease 4. No acute fracture or listhesis of the cervical spine. 5. No acute intrathoracic or  intra-abdominal injury. 6. Endotracheal tube and orogastric tube in appropriate position. 7. Cholelithiasis. Electronically Signed   By: Helyn Numbers M.D.   On: 01/11/2022 01:11   CT CHEST ABDOMEN PELVIS W CONTRAST  Result Date: 01/11/2022 CLINICAL DATA:  Head trauma, abnormal mental status (Age 73-64y); Facial trauma, blunt; Polytrauma, blunt; Neck trauma, dangerous injury mechanism (Age 76-64y) EXAM: CT HEAD WITHOUT CONTRAST CT MAXILLOFACIAL WITHOUT CONTRAST CT CERVICAL SPINE WITHOUT CONTRAST CT CHEST, ABDOMEN AND PELVIS WITH CONTRAST TECHNIQUE: Contiguous axial  images were obtained from the base of the skull through the vertex without intravenous contrast. Multidetector CT imaging of the maxillofacial structures was performed. Multiplanar CT image reconstructions were also generated. A small metallic BB was placed on the right temple in order to reliably differentiate right from left. Multidetector CT imaging of the cervical spine was performed without intravenous contrast. Multiplanar CT image reconstructions were also generated. Multidetector CT imaging of the chest, abdomen and pelvis was performed following the standard protocol during bolus administration of intravenous contrast. RADIATION DOSE REDUCTION: This exam was performed according to the departmental dose-optimization program which includes automated exposure control, adjustment of the mA and/or kV according to patient size and/or use of iterative reconstruction technique. CONTRAST:  26mL OMNIPAQUE IOHEXOL 300 MG/ML  SOLN COMPARISON:  None Available. FINDINGS: CT HEAD FINDINGS Brain: Normal anatomic configuration. No abnormal intra or extra-axial mass lesion or fluid collection. No abnormal mass effect or midline shift. No evidence of acute intracranial hemorrhage or infarct. Ventricular size is normal. Cerebellum unremarkable. Vascular: Unremarkable Skull: Intact Other: Mastoid air cells and middle ear cavities are clear. CT MAXILLOFACIAL  FINDINGS Osseous: No fracture or mandibular dislocation. No destructive process. Orbits: Negative. No traumatic or inflammatory finding. Sinuses: Moderate mucosal thickening within the right maxillary sinus. No air-fluid levels. Remaining paranasal sinuses are clear. Soft tissues: Negative. Endotracheal tube and orogastric tube are noted. CT CERVICAL SPINE FINDINGS Alignment: Normal. Skull base and vertebrae: Right cervical alignment is normal. The atlantodental interval is not widened. No acute fracture of the cervical spine. Vertebral body height is preserved. Soft tissues and spinal canal: No prevertebral fluid or swelling. No visible canal hematoma. Disc levels: Intervertebral disc heights are preserved. Vertebral body heights are preserved. Prevertebral soft tissues are not overtly thickened. Nasogastric tube and orogastric tube are partially visualized. Spinal canal is widely patent. No significant neuroforaminal narrowing. Other: None CT CHEST FINDINGS Cardiovascular: No significant vascular findings. Normal heart size. No pericardial effusion. Mediastinum/Nodes: Endotracheal tube seen 3.6 cm above the carina. Nasogastric tube extends into the distal body of the stomach. Thyroid is unremarkable. No pathologic thoracic adenopathy. Esophagus is unremarkable. No pneumomediastinum. No mediastinal hematoma. Lungs/Pleura: Lungs are clear. No pleural effusion or pneumothorax. Musculoskeletal: No acute bone abnormality. CT ABDOMEN AND PELVIS FINDINGS Hepatobiliary: Cholelithiasis without pericholecystic inflammatory change. Liver unremarkable. No intra or extrahepatic biliary ductal dilation. Pancreas: Unremarkable Spleen: Unremarkable Adrenals/Urinary Tract: The adrenal glands are unremarkable. The kidneys are normal. Foley catheter balloon is seen within a decompressed bladder lumen. Stomach/Bowel: Stomach is within normal limits. Appendix appears normal. No evidence of bowel wall thickening, distention, or  inflammatory changes. No free intraperitoneal gas or fluid. Vascular/Lymphatic: No significant vascular findings are present. No enlarged abdominal or pelvic lymph nodes. Reproductive: Uterus and bilateral adnexa are unremarkable. Other: No abdominal wall hernia or abnormality. No abdominopelvic ascites. Musculoskeletal: No acute or significant osseous findings. IMPRESSION: 1. No acute intracranial injury. No calvarial fracture. 2. No acute facial fracture. 3. Moderate right maxillary sinus disease 4. No acute fracture or listhesis of the cervical spine. 5. No acute intrathoracic or intra-abdominal injury. 6. Endotracheal tube and orogastric tube in appropriate position. 7. Cholelithiasis. Electronically Signed   By: Helyn Numbers M.D.   On: 01/11/2022 01:11   DG Chest Portable 1 View  Result Date: 01/11/2022 CLINICAL DATA:  Intubation, aspiration.  Cardiac arrest. EXAM: PORTABLE CHEST 1 VIEW COMPARISON:  Radiograph 12/11/2021 FINDINGS: Endotracheal tube tip 3.8 cm in the carina. Tip and side port of the enteric tube below  the diaphragm in the stomach. The heart is normal in size with normal mediastinal contours. No focal airspace disease. No pneumothorax or pleural effusion. Surgical hardware in the right clavicle. No acute rib fractures are seen. IMPRESSION: 1. Endotracheal tube tip 3.8 cm in the carina. Enteric tube in place. 2. No acute chest findings. Electronically Signed   By: Narda Rutherford M.D.   On: 01/11/2022 00:25   DG Abd Portable 1V  Result Date: 01/11/2022 CLINICAL DATA:  Cardiac arrest.  OG tube placement. EXAM: PORTABLE ABDOMEN - 1 VIEW COMPARISON:  CT 10/09/2021 FINDINGS: Tip and side port of the enteric tube below the diaphragm in the stomach. No bowel dilatation or evidence of obstruction. IMPRESSION: Tip and side port of the enteric tube below the diaphragm in the stomach. Electronically Signed   By: Narda Rutherford M.D.   On: 01/11/2022 00:24   DG Pelvis Portable  Result Date:  01/11/2022 CLINICAL DATA:  Fall, altered mental status.  Cardiac arrest. EXAM: PORTABLE PELVIS 1-2 VIEWS COMPARISON:  None Available. FINDINGS: The cortical margins of the bony pelvis are intact. No fracture. Pubic symphysis and sacroiliac joints are congruent. Both femoral heads are well-seated in the respective acetabula. IMPRESSION: No pelvic fracture. Electronically Signed   By: Narda Rutherford M.D.   On: 01/11/2022 00:23   (Echo, Carotid, EGD, Colonoscopy, ERCP)    Subjective: Patient seen and examined.  No overnight events.  Mild chest wall pain due to chest compression.  Up about.  Had an episode of slight anxiety overnight otherwise feels comfortable. Parents at the bedside who are planning to drive her back to their home in [place].   Discharge Exam: Vitals:   01/14/22 0700 01/14/22 0746  BP:    Pulse: (!) 57   Resp: 14   Temp:  98.1 F (36.7 C)  SpO2: 98%    Vitals:   01/14/22 0329 01/14/22 0500 01/14/22 0700 01/14/22 0746  BP:      Pulse: (!) 56  (!) 57   Resp: 17  14   Temp: 97.6 F (36.4 C)   98.1 F (36.7 C)  TempSrc: Oral   Axillary  SpO2: 99%  98%   Weight:  59.5 kg    Height:        General: Pt is alert, awake, not in acute distress Alert and oriented x4.  No tremors. Cardiovascular: RRR, S1/S2 +, no rubs, no gallops Respiratory: CTA bilaterally, no wheezing, no rhonchi Abdominal: Soft, NT, ND, bowel sounds + Extremities: no edema, no cyanosis    The results of significant diagnostics from this hospitalization (including imaging, microbiology, ancillary and laboratory) are listed below for reference.     Microbiology: Recent Results (from the past 240 hour(s))  Urine Culture     Status: Abnormal   Collection Time: 01/11/22  1:14 AM   Specimen: Urine, Catheterized  Result Value Ref Range Status   Specimen Description URINE, CATHETERIZED  Final   Special Requests NONE  Final   Culture (A)  Final    4,000 COLONIES/mL STREPTOCOCCI, ALPHA  HEMOLYTIC Usually susceptible to penicillin and other beta lactam agents,quinolones,macrolides and tetracyclines. Performed at [facility], [address], [place], [place] [address]    Report Status 01/12/2022 FINAL  Final  Culture, blood (routine x 2)     Status: None (Preliminary result)   Collection Time: 01/11/22  1:45 AM   Specimen: BLOOD  Result Value Ref Range Status   Specimen Description BLOOD LEFT FOOT  Final  Special Requests   Final    BOTTLES DRAWN AEROBIC AND ANAEROBIC Blood Culture adequate volume   Culture   Final    NO GROWTH 3 DAYS Performed at [facility], [address], [place], [place] [address]    Report Status PENDING  Incomplete  MRSA Next Gen by PCR, Nasal     Status: Abnormal   Collection Time: 01/11/22  2:37 AM   Specimen: Nasal Mucosa; Nasal Swab  Result Value Ref Range Status   MRSA by PCR Next Gen DETECTED (A) NOT DETECTED Final    Comment: RESULT CALLED TO, READ BACK BY AND VERIFIED WITH: J PATZ,RN@0404  01/11/22 MK (NOTE) The GeneXpert MRSA Assay (FDA approved for NASAL specimens only), is one component of a comprehensive MRSA colonization surveillance program. It is not intended to diagnose MRSA infection nor to guide or monitor treatment for MRSA infections. Test performance is not FDA approved in patients less than 30 years old. Performed at [facility], [address], [place], [place] [address]   Culture, blood (routine x 2)     Status: None (Preliminary result)   Collection Time: 01/11/22  5:52 AM   Specimen: BLOOD  Result Value Ref Range Status   Specimen Description BLOOD LEFT ANTECUBITAL  Final   Special Requests   Final    BOTTLES DRAWN AEROBIC AND ANAEROBIC Blood Culture adequate volume   Culture   Final    NO GROWTH 3 DAYS Performed at [facility], [address] [place], [place] [address]    Report Status PENDING  Incomplete     Labs: BNP (last 3 results) No results for input(s): "BNP" in the  last 8760 hours. Basic Metabolic Panel: Recent Labs  Lab 01/10/22 2346 01/10/22 2353 01/11/22 0005 01/11/22 0056 01/11/22 0542 01/12/22 0547 01/13/22 0038 01/14/22 0111  NA 137 135  135  --  135 141 133* 137 139  K 2.9* 3.0*  3.0*  --  2.0* 2.9* 3.1* 3.4* 4.6  CL 88* 88*  --   --  97* 93* 100 102  CO2 34*  --   --   --  GLUCOSE 109* 111*  --   --  109* 120* 88 96  BUN 11 12  --   --  9 5* 7 8  CREATININE 0.71 1.30*  --   --  0.76 0.72 0.63 0.68  CALCIUM 8.7*  --   --   --  8.0* 7.8* 8.3* 8.7*  MG  --   --  2.3  --  1.7 1.8  --   --   PHOS  --   --   --   --   --  2.9  --   --    Liver Function Tests: Recent Labs  Lab 01/10/22 2346 01/11/22 0542  AST 97* 78*  ALT 41 35  ALKPHOS 91 75  BILITOT 0.6 0.5  PROT 7.0 5.8*  ALBUMIN 3.8 3.2*   No results for input(s): "LIPASE", "AMYLASE" in the last 168 hours. No results for input(s): "AMMONIA" in the last 168 hours. CBC: Recent Labs  Lab 01/11/22 0145 01/11/22 0542 01/12/22 0547 01/13/22 0038 01/14/22 0111  WBC 7.3 6.6 3.3* 2.7* 2.8*  NEUTROABS 5.2  --   --   --   --   HGB 12.5 11.8* 9.0* 10.0* 10.1*  HCT 35.6* 33.7* 28.1* 29.9* 30.7*  MCV 86.0 85.8 92.4 90.9 91.4  PLT 139* 142* 122* 136*  177   Cardiac Enzymes: Recent Labs  Lab 01/12/22 0547  CKTOTAL 63   BNP: Invalid input(s): "POCBNP" CBG: Recent Labs  Lab 01/10/22 2358 01/11/22 0226 01/11/22 0721 01/11/22 1124 01/11/22 1523  GLUCAP 112* 99 87 89 80   D-Dimer No results for input(s): "DDIMER" in the last 72 hours. Hgb A1c No results for input(s): "HGBA1C" in the last 72 hours. Lipid Profile Recent Labs    01/12/22 0547  TRIG 86   Thyroid function studies No results for input(s): "TSH", "T4TOTAL", "T3FREE", "THYROIDAB" in the last 72 hours.  Invalid input(s): "FREET3" Anemia work up No results for input(s): "VITAMINB12", "FOLATE", "FERRITIN", "TIBC", "IRON", "RETICCTPCT" in the last 72 hours. Urinalysis    Component Value  Date/Time   COLORURINE YELLOW 01/11/2022 0055   APPEARANCEUR HAZY (A) 01/11/2022 0055   LABSPEC 1.035 (H) 01/11/2022 0055   PHURINE 7.0 01/11/2022 0055   GLUCOSEU NEGATIVE 01/11/2022 0055   HGBUR SMALL (A) 01/11/2022 0055   BILIRUBINUR NEGATIVE 01/11/2022 0055   KETONESUR NEGATIVE 01/11/2022 0055   PROTEINUR 100 (A) 01/11/2022 0055   NITRITE NEGATIVE 01/11/2022 0055   LEUKOCYTESUR NEGATIVE 01/11/2022 0055   Sepsis Labs Recent Labs  Lab 01/11/22 0542 01/12/22 0547 01/13/22 0038 01/14/22 0111  WBC 6.6 3.3* 2.7* 2.8*   Microbiology Recent Results (from the past 240 hour(s))  Urine Culture     Status: Abnormal   Collection Time: 01/11/22  1:14 AM   Specimen: Urine, Catheterized  Result Value Ref Range Status   Specimen Description URINE, CATHETERIZED  Final   Special Requests NONE  Final   Culture (A)  Final    4,000 COLONIES/mL STREPTOCOCCI, ALPHA HEMOLYTIC Usually susceptible to penicillin and other beta lactam agents,quinolones,macrolides and tetracyclines. Performed at [facility], [address], [place], [place] [address]    Report Status 01/12/2022 FINAL  Final  Culture, blood (routine x 2)     Status: None (Preliminary result)   Collection Time: 01/11/22  1:45 AM   Specimen: BLOOD  Result Value Ref Range Status   Specimen Description BLOOD LEFT FOOT  Final   Special Requests   Final    BOTTLES DRAWN AEROBIC AND ANAEROBIC Blood Culture adequate volume   Culture   Final    NO GROWTH 3 DAYS Performed at [facility], [address], [place], [place] [address]    Report Status PENDING  Incomplete  MRSA Next Gen by PCR, Nasal     Status: Abnormal   Collection Time: 01/11/22  2:37 AM   Specimen: Nasal Mucosa; Nasal Swab  Result Value Ref Range Status   MRSA by PCR Next Gen DETECTED (A) NOT DETECTED Final    Comment: RESULT CALLED TO, READ BACK BY AND VERIFIED WITH: J PATZ,RN@0404  01/11/22 MK (NOTE) The GeneXpert MRSA Assay (FDA approved for NASAL  specimens only), is one component of a comprehensive MRSA colonization surveillance program. It is not intended to diagnose MRSA infection nor to guide or monitor treatment for MRSA infections. Test performance is not FDA approved in patients less than 38 years old. Performed at [facility], [address], [place], [place] [address]   Culture, blood (routine x 2)     Status: None (Preliminary result)   Collection Time: 01/11/22  5:52 AM   Specimen: BLOOD  Result Value Ref Range Status   Specimen Description BLOOD LEFT ANTECUBITAL  Final   Special Requests   Final    BOTTLES DRAWN AEROBIC AND ANAEROBIC Blood Culture  adequate volume   Culture   Final    NO GROWTH 3 DAYS Performed at [facility], [address], [place], [place] [address]    Report Status PENDING  Incomplete     Time coordinating discharge: 35 minutes  SIGNED:   Dorcas Carrow, MD  [facility] 01/14/2022, 8:37 AM

## 2022-01-16 LAB — CULTURE, BLOOD (ROUTINE X 2)
Culture: NO GROWTH
Culture: NO GROWTH
Special Requests: ADEQUATE
Special Requests: ADEQUATE

## 2022-01-27 ENCOUNTER — Other Ambulatory Visit: Payer: Self-pay | Admitting: Specialist

## 2022-01-27 DIAGNOSIS — M25511 Pain in right shoulder: Secondary | ICD-10-CM

## 2022-10-12 ENCOUNTER — Encounter: Payer: Self-pay | Admitting: *Deleted
